# Patient Record
Sex: Female | Born: 1940 | Race: White | Hispanic: No | Marital: Married | Smoking: Current every day smoker
Health system: Southern US, Community
[De-identification: ages and names within clinical notes are randomized; demographics above are authoritative.]

## PROBLEM LIST (undated history)

## (undated) DIAGNOSIS — J439 Emphysema, unspecified: Secondary | ICD-10-CM

## (undated) DIAGNOSIS — R42 Dizziness and giddiness: Secondary | ICD-10-CM

## (undated) DIAGNOSIS — H905 Unspecified sensorineural hearing loss: Secondary | ICD-10-CM

## (undated) DIAGNOSIS — M1990 Unspecified osteoarthritis, unspecified site: Secondary | ICD-10-CM

## (undated) DIAGNOSIS — R600 Localized edema: Secondary | ICD-10-CM

## (undated) DIAGNOSIS — I700 Atherosclerosis of aorta: Secondary | ICD-10-CM

## (undated) DIAGNOSIS — H9319 Tinnitus, unspecified ear: Secondary | ICD-10-CM

## (undated) DIAGNOSIS — J309 Allergic rhinitis, unspecified: Secondary | ICD-10-CM

## (undated) DIAGNOSIS — F329 Major depressive disorder, single episode, unspecified: Secondary | ICD-10-CM

## (undated) DIAGNOSIS — Z8709 Personal history of other diseases of the respiratory system: Secondary | ICD-10-CM

## (undated) DIAGNOSIS — K219 Gastro-esophageal reflux disease without esophagitis: Secondary | ICD-10-CM

## (undated) DIAGNOSIS — K589 Irritable bowel syndrome without diarrhea: Secondary | ICD-10-CM

## (undated) DIAGNOSIS — Z87442 Personal history of urinary calculi: Secondary | ICD-10-CM

## (undated) DIAGNOSIS — R51 Headache: Secondary | ICD-10-CM

## (undated) DIAGNOSIS — E785 Hyperlipidemia, unspecified: Secondary | ICD-10-CM

## (undated) DIAGNOSIS — B370 Candidal stomatitis: Secondary | ICD-10-CM

## (undated) DIAGNOSIS — H3530 Unspecified macular degeneration: Secondary | ICD-10-CM

## (undated) DIAGNOSIS — F419 Anxiety disorder, unspecified: Secondary | ICD-10-CM

## (undated) DIAGNOSIS — N201 Calculus of ureter: Secondary | ICD-10-CM

## (undated) DIAGNOSIS — F32A Depression, unspecified: Secondary | ICD-10-CM

## (undated) DIAGNOSIS — R634 Abnormal weight loss: Secondary | ICD-10-CM

## (undated) DIAGNOSIS — J45909 Unspecified asthma, uncomplicated: Secondary | ICD-10-CM

## (undated) DIAGNOSIS — M549 Dorsalgia, unspecified: Secondary | ICD-10-CM

## (undated) DIAGNOSIS — K529 Noninfective gastroenteritis and colitis, unspecified: Secondary | ICD-10-CM

## (undated) DIAGNOSIS — R918 Other nonspecific abnormal finding of lung field: Secondary | ICD-10-CM

## (undated) DIAGNOSIS — I10 Essential (primary) hypertension: Secondary | ICD-10-CM

## (undated) DIAGNOSIS — R519 Headache, unspecified: Secondary | ICD-10-CM

## (undated) DIAGNOSIS — J189 Pneumonia, unspecified organism: Secondary | ICD-10-CM

## (undated) HISTORY — DX: Unspecified sensorineural hearing loss: H90.5

## (undated) HISTORY — DX: Abnormal weight loss: R63.4

## (undated) HISTORY — DX: Dizziness and giddiness: R42

## (undated) HISTORY — PX: OTHER SURGICAL HISTORY: SHX169

## (undated) HISTORY — PX: ECTOPIC PREGNANCY SURGERY: SHX613

## (undated) HISTORY — DX: Irritable bowel syndrome, unspecified: K58.9

## (undated) HISTORY — PX: NASAL SINUS SURGERY: SHX719

## (undated) HISTORY — DX: Allergic rhinitis, unspecified: J30.9

## (undated) HISTORY — PX: TUBAL LIGATION: SHX77

## (undated) HISTORY — DX: Tinnitus, unspecified ear: H93.19

## (undated) HISTORY — PX: CATARACT EXTRACTION: SUR2

---

## 2004-07-14 ENCOUNTER — Encounter: Admission: RE | Admit: 2004-07-14 | Discharge: 2004-07-14 | Payer: Self-pay | Admitting: Family Medicine

## 2004-07-27 ENCOUNTER — Ambulatory Visit: Payer: Self-pay | Admitting: Critical Care Medicine

## 2004-07-30 ENCOUNTER — Ambulatory Visit (HOSPITAL_COMMUNITY): Admission: RE | Admit: 2004-07-30 | Discharge: 2004-07-30 | Payer: Self-pay | Admitting: Critical Care Medicine

## 2004-08-18 ENCOUNTER — Ambulatory Visit: Payer: Self-pay | Admitting: Critical Care Medicine

## 2005-01-03 ENCOUNTER — Encounter: Admission: RE | Admit: 2005-01-03 | Discharge: 2005-01-03 | Payer: Self-pay | Admitting: Family Medicine

## 2005-09-30 ENCOUNTER — Encounter: Admission: RE | Admit: 2005-09-30 | Discharge: 2005-09-30 | Payer: Self-pay | Admitting: Family Medicine

## 2006-07-25 ENCOUNTER — Encounter: Admission: RE | Admit: 2006-07-25 | Discharge: 2006-07-25 | Payer: Self-pay | Admitting: Family Medicine

## 2009-08-10 ENCOUNTER — Ambulatory Visit (HOSPITAL_BASED_OUTPATIENT_CLINIC_OR_DEPARTMENT_OTHER): Admission: RE | Admit: 2009-08-10 | Discharge: 2009-08-10 | Payer: Self-pay | Admitting: Urology

## 2009-08-10 HISTORY — PX: OTHER SURGICAL HISTORY: SHX169

## 2010-04-24 ENCOUNTER — Encounter: Payer: Self-pay | Admitting: Family Medicine

## 2010-04-25 ENCOUNTER — Encounter: Payer: Self-pay | Admitting: Critical Care Medicine

## 2010-06-22 LAB — POCT I-STAT 4, (NA,K, GLUC, HGB,HCT)
Glucose, Bld: 95 mg/dL (ref 70–99)
HCT: 37 % (ref 36.0–46.0)
Hemoglobin: 12.6 g/dL (ref 12.0–15.0)
Potassium: 4.3 mEq/L (ref 3.5–5.1)
Sodium: 142 mEq/L (ref 135–145)

## 2011-08-04 ENCOUNTER — Other Ambulatory Visit: Payer: Self-pay | Admitting: Otolaryngology

## 2011-08-04 DIAGNOSIS — R42 Dizziness and giddiness: Secondary | ICD-10-CM

## 2011-08-04 DIAGNOSIS — H9319 Tinnitus, unspecified ear: Secondary | ICD-10-CM

## 2011-08-10 ENCOUNTER — Ambulatory Visit
Admission: RE | Admit: 2011-08-10 | Discharge: 2011-08-10 | Disposition: A | Discharge status: Home / Self Care | Payer: Medicare Other | Source: Ambulatory Visit | Attending: Otolaryngology | Admitting: Otolaryngology

## 2011-08-10 DIAGNOSIS — R42 Dizziness and giddiness: Secondary | ICD-10-CM

## 2011-08-10 DIAGNOSIS — H9319 Tinnitus, unspecified ear: Secondary | ICD-10-CM

## 2011-08-10 MED ORDER — GADOBENATE DIMEGLUMINE 529 MG/ML IV SOLN
10.0000 mL | Freq: Once | INTRAVENOUS | Status: AC | PRN
  Administered 2011-08-10: 10 mL via INTRAVENOUS

## 2011-09-21 ENCOUNTER — Emergency Department (HOSPITAL_COMMUNITY)
Admission: EM | Admit: 2011-09-21 | Discharge: 2011-09-21 | Disposition: A | Discharge status: Home / Self Care | Payer: Medicare Other | Attending: Emergency Medicine | Admitting: Emergency Medicine

## 2011-09-21 ENCOUNTER — Encounter (HOSPITAL_COMMUNITY): Payer: Self-pay | Admitting: *Deleted

## 2011-09-21 DIAGNOSIS — R040 Epistaxis: Secondary | ICD-10-CM

## 2011-09-21 DIAGNOSIS — I10 Essential (primary) hypertension: Secondary | ICD-10-CM | POA: Insufficient documentation

## 2011-09-21 DIAGNOSIS — H1130 Conjunctival hemorrhage, unspecified eye: Secondary | ICD-10-CM | POA: Insufficient documentation

## 2011-09-21 HISTORY — DX: Essential (primary) hypertension: I10

## 2011-09-21 HISTORY — DX: Major depressive disorder, single episode, unspecified: F32.9

## 2011-09-21 HISTORY — DX: Depression, unspecified: F32.A

## 2011-09-21 MED ORDER — OXYMETAZOLINE HCL 0.05 % NA SOLN
NASAL | Status: AC
  Administered 2011-09-21: 1 via NASAL
  Filled 2011-09-21: qty 15

## 2011-09-21 MED ORDER — OXYMETAZOLINE HCL 0.05 % NA SOLN
1.0000 | Freq: Once | NASAL | Status: AC
  Administered 2011-09-21: 1 via NASAL

## 2011-09-21 NOTE — ED Notes (Signed)
Ice pack applied to pt nose by MD

## 2011-09-21 NOTE — ED Notes (Signed)
Active nosebleed to left nares. Pt applying pressure. Pt reports nosebleed started approx 45 minutes ago. Pt states she had sinus surgery Monday and was to follow up with MD on Friday.

## 2011-09-21 NOTE — ED Notes (Signed)
Pt presents with epistaxis on left side. Reports having surgery Monday 09/12/2011 to treat sinus infection (Dr. Jenne Pane). States she has follow up apt Friday 09/23/2011 with surgeon. Left eye red - states this began this morning upon awakening. Denies pain or tenderness.

## 2011-09-21 NOTE — ED Provider Notes (Signed)
History     CSN: [id number]  Arrival date & time 09/21/11  0840   First MD Initiated Contact with Patient 09/21/11 [phone]      Chief Complaint  Patient presents with  . Epistaxis    (Consider location/radiation/quality/duration/timing/severity/associated sxs/prior treatment) The history is provided by the patient.   the patient reports developing bleeding from her left nostril which began today.  She takes an aspirin but no other anticoagulants.  10 days ago she had right sided sinus surgery.  She's never had nosebleeds like this before.  She denies trauma to the area.  She denies recent easy bleeding.  Her symptoms have been minimally controlled by direct pressure.  She called her in her nose and throat surgeon in the recording stated that this was an emergency to go to the emergency room and that she presents here for evaluation.  Past Medical History  Diagnosis Date  . Hypertension   . Depression     Past Surgical History  Procedure Date  . Nasal sinus surgery     No family history on file.  History  Substance Use Topics  . Smoking status: Current Everyday Smoker -- 1.0 packs/day for 25 years    Types: Cigarettes  . Smokeless tobacco: Not on file  . Alcohol Use: Yes     socially    OB History    Grav Para Term Preterm Abortions TAB SAB Ect Mult Living                  Review of Systems  HENT: Positive for nosebleeds.   All other systems reviewed and are negative.    Allergies  Penicillins and Sulfonamide derivatives  Home Medications   Current Outpatient Rx  Name Route Sig Dispense Refill  . AMLODIPINE BESYLATE 5 MG PO TABS Oral Take 5 mg by mouth daily.    . ASPIRIN EC 81 MG PO TBEC Oral Take 81 mg by mouth daily.    . ATORVASTATIN CALCIUM 40 MG PO TABS Oral Take 40 mg by mouth daily.    Marland Kitchen FLUTICASONE PROPIONATE 50 MCG/ACT NA SUSP Nasal Place 2 sprays into the nose daily.    Marland Kitchen LOPERAMIDE HCL 2 MG PO CAPS Oral Take 2 mg by mouth 4 (four) times daily as  needed. For upset stomach    . MOMETASONE FUROATE 220 MCG/INH IN AEPB Inhalation Inhale 2 puffs into the lungs daily.    Marland Kitchen MONTELUKAST SODIUM 10 MG PO TABS Oral Take 10 mg by mouth at bedtime.    . ADULT MULTIVITAMIN W/MINERALS CH Oral Take 1 tablet by mouth daily.    Marland Kitchen NAPROXEN SODIUM 220 MG PO TABS Oral Take 220 mg by mouth daily. For pain    . RANITIDINE HCL 300 MG PO TABS Oral Take 300 mg by mouth 2 (two) times daily.    Marland Kitchen SOLIFENACIN SUCCINATE 10 MG PO TABS Oral Take 5 mg by mouth daily.      BP 154/75  Pulse 77  Ht 5\' 4"  (1.626 m)  Wt 107 lb (48.535 kg)  BMI 18.37 kg/m2  SpO2 97%  Physical Exam  Nursing note and vitals reviewed. Constitutional: She is oriented to person, place, and time. She appears well-developed and well-nourished. No distress.  HENT:  Head: Normocephalic and atraumatic.       Subconjunctival hemorrhage left eye.  Small bleeding noted anteriorly from left nare.  No posterior bleeding noted.  Pharynx is normal  Eyes: EOM are normal.  Neck: Normal range  of motion.  Cardiovascular: Normal rate, regular rhythm and normal heart sounds.   Pulmonary/Chest: Effort normal and breath sounds normal.  Abdominal: Soft. She exhibits no distension. There is no tenderness.  Musculoskeletal: Normal range of motion.  Neurological: She is alert and oriented to person, place, and time.  Skin: Skin is warm and dry.  Psychiatric: She has a normal mood and affect. Judgment normal.    ED Course  EPISTAXIS MANAGEMENT Performed by: Lyanne Co Authorized by: Lyanne Co Consent given by: patient Anesthetic total: 3 ml Patient sedated: no Treatment site: left anterior Repair method: Direct pressure and Afrin. Post-procedure assessment: bleeding stopped Patient tolerance: Patient tolerated the procedure well with no immediate complications.   (including critical care time)  Labs Reviewed - No data to display No results found.   1. Epistaxis       MDM  The  patient's left anterior nosebleed appears to be controlled with Afrin and direct pressure.  She has an ice pack on her nose at this time.  I will observe the patient emergency department.  10:50 AM No bleeding at this time. No recurrent bleeding. Dc home with ENT follow up        Lyanne Co, MD 09/21/11 1050

## 2011-09-21 NOTE — ED Notes (Signed)
ENT cart placed at pt door.

## 2011-09-21 NOTE — ED Notes (Signed)
Pt husband at bedside holding pressure to nares. Pt in no apparent distress and will be reassessed by MD and nurse.

## 2011-09-21 NOTE — Discharge Instructions (Signed)

## 2012-01-23 ENCOUNTER — Encounter (INDEPENDENT_AMBULATORY_CARE_PROVIDER_SITE_OTHER): Payer: Medicare Other | Admitting: Ophthalmology

## 2012-01-23 DIAGNOSIS — H43819 Vitreous degeneration, unspecified eye: Secondary | ICD-10-CM

## 2012-01-23 DIAGNOSIS — H3530 Unspecified macular degeneration: Secondary | ICD-10-CM

## 2012-01-23 DIAGNOSIS — I10 Essential (primary) hypertension: Secondary | ICD-10-CM

## 2012-01-23 DIAGNOSIS — H35039 Hypertensive retinopathy, unspecified eye: Secondary | ICD-10-CM

## 2012-01-23 DIAGNOSIS — H2510 Age-related nuclear cataract, unspecified eye: Secondary | ICD-10-CM

## 2012-01-23 DIAGNOSIS — H353290 Exudative age-related macular degeneration, unspecified eye, stage unspecified: Secondary | ICD-10-CM

## 2012-01-23 DIAGNOSIS — H35379 Puckering of macula, unspecified eye: Secondary | ICD-10-CM

## 2012-01-27 ENCOUNTER — Ambulatory Visit (INDEPENDENT_AMBULATORY_CARE_PROVIDER_SITE_OTHER): Payer: Medicare Other | Admitting: Ophthalmology

## 2012-01-27 DIAGNOSIS — H3530 Unspecified macular degeneration: Secondary | ICD-10-CM

## 2012-01-27 DIAGNOSIS — H2510 Age-related nuclear cataract, unspecified eye: Secondary | ICD-10-CM

## 2012-01-27 DIAGNOSIS — H43819 Vitreous degeneration, unspecified eye: Secondary | ICD-10-CM

## 2012-01-27 DIAGNOSIS — I10 Essential (primary) hypertension: Secondary | ICD-10-CM

## 2012-01-27 DIAGNOSIS — H35039 Hypertensive retinopathy, unspecified eye: Secondary | ICD-10-CM

## 2012-01-27 DIAGNOSIS — H353290 Exudative age-related macular degeneration, unspecified eye, stage unspecified: Secondary | ICD-10-CM

## 2012-02-21 ENCOUNTER — Encounter (INDEPENDENT_AMBULATORY_CARE_PROVIDER_SITE_OTHER): Payer: Medicare Other | Admitting: Ophthalmology

## 2012-02-21 DIAGNOSIS — H35039 Hypertensive retinopathy, unspecified eye: Secondary | ICD-10-CM

## 2012-02-21 DIAGNOSIS — H353290 Exudative age-related macular degeneration, unspecified eye, stage unspecified: Secondary | ICD-10-CM

## 2012-02-21 DIAGNOSIS — H3530 Unspecified macular degeneration: Secondary | ICD-10-CM

## 2012-02-21 DIAGNOSIS — H43819 Vitreous degeneration, unspecified eye: Secondary | ICD-10-CM

## 2012-02-21 DIAGNOSIS — I10 Essential (primary) hypertension: Secondary | ICD-10-CM

## 2012-02-21 DIAGNOSIS — H2510 Age-related nuclear cataract, unspecified eye: Secondary | ICD-10-CM

## 2012-03-21 ENCOUNTER — Encounter (INDEPENDENT_AMBULATORY_CARE_PROVIDER_SITE_OTHER): Payer: Medicare Other | Admitting: Ophthalmology

## 2012-03-21 DIAGNOSIS — H3530 Unspecified macular degeneration: Secondary | ICD-10-CM

## 2012-03-21 DIAGNOSIS — H353290 Exudative age-related macular degeneration, unspecified eye, stage unspecified: Secondary | ICD-10-CM

## 2012-03-21 DIAGNOSIS — H35039 Hypertensive retinopathy, unspecified eye: Secondary | ICD-10-CM

## 2012-03-21 DIAGNOSIS — I10 Essential (primary) hypertension: Secondary | ICD-10-CM

## 2012-03-21 DIAGNOSIS — H2510 Age-related nuclear cataract, unspecified eye: Secondary | ICD-10-CM

## 2012-03-21 DIAGNOSIS — H43819 Vitreous degeneration, unspecified eye: Secondary | ICD-10-CM

## 2012-04-18 ENCOUNTER — Encounter (INDEPENDENT_AMBULATORY_CARE_PROVIDER_SITE_OTHER): Payer: Medicare Other | Admitting: Ophthalmology

## 2012-04-18 DIAGNOSIS — I10 Essential (primary) hypertension: Secondary | ICD-10-CM

## 2012-04-18 DIAGNOSIS — H3530 Unspecified macular degeneration: Secondary | ICD-10-CM

## 2012-04-18 DIAGNOSIS — H2510 Age-related nuclear cataract, unspecified eye: Secondary | ICD-10-CM

## 2012-04-18 DIAGNOSIS — H35039 Hypertensive retinopathy, unspecified eye: Secondary | ICD-10-CM

## 2012-04-18 DIAGNOSIS — H43819 Vitreous degeneration, unspecified eye: Secondary | ICD-10-CM

## 2012-04-18 DIAGNOSIS — H353290 Exudative age-related macular degeneration, unspecified eye, stage unspecified: Secondary | ICD-10-CM

## 2012-05-15 ENCOUNTER — Encounter (INDEPENDENT_AMBULATORY_CARE_PROVIDER_SITE_OTHER): Payer: Medicare Other | Admitting: Ophthalmology

## 2012-05-15 DIAGNOSIS — H2510 Age-related nuclear cataract, unspecified eye: Secondary | ICD-10-CM

## 2012-05-15 DIAGNOSIS — I10 Essential (primary) hypertension: Secondary | ICD-10-CM

## 2012-05-15 DIAGNOSIS — H35039 Hypertensive retinopathy, unspecified eye: Secondary | ICD-10-CM

## 2012-05-15 DIAGNOSIS — H353290 Exudative age-related macular degeneration, unspecified eye, stage unspecified: Secondary | ICD-10-CM

## 2012-05-15 DIAGNOSIS — H43819 Vitreous degeneration, unspecified eye: Secondary | ICD-10-CM

## 2012-05-15 DIAGNOSIS — H3530 Unspecified macular degeneration: Secondary | ICD-10-CM

## 2012-05-16 ENCOUNTER — Encounter (INDEPENDENT_AMBULATORY_CARE_PROVIDER_SITE_OTHER): Payer: Medicare Other | Admitting: Ophthalmology

## 2012-06-13 ENCOUNTER — Encounter (INDEPENDENT_AMBULATORY_CARE_PROVIDER_SITE_OTHER): Payer: Medicare Other | Admitting: Ophthalmology

## 2012-06-13 DIAGNOSIS — H353290 Exudative age-related macular degeneration, unspecified eye, stage unspecified: Secondary | ICD-10-CM

## 2012-06-13 DIAGNOSIS — H43819 Vitreous degeneration, unspecified eye: Secondary | ICD-10-CM

## 2012-06-13 DIAGNOSIS — H3530 Unspecified macular degeneration: Secondary | ICD-10-CM

## 2012-06-13 DIAGNOSIS — H35039 Hypertensive retinopathy, unspecified eye: Secondary | ICD-10-CM

## 2012-06-13 DIAGNOSIS — H2510 Age-related nuclear cataract, unspecified eye: Secondary | ICD-10-CM

## 2012-06-13 DIAGNOSIS — I10 Essential (primary) hypertension: Secondary | ICD-10-CM

## 2012-07-11 ENCOUNTER — Encounter (INDEPENDENT_AMBULATORY_CARE_PROVIDER_SITE_OTHER): Payer: Medicare Other | Admitting: Ophthalmology

## 2012-07-11 DIAGNOSIS — H2510 Age-related nuclear cataract, unspecified eye: Secondary | ICD-10-CM

## 2012-07-11 DIAGNOSIS — H3530 Unspecified macular degeneration: Secondary | ICD-10-CM

## 2012-07-11 DIAGNOSIS — I10 Essential (primary) hypertension: Secondary | ICD-10-CM

## 2012-07-11 DIAGNOSIS — H43819 Vitreous degeneration, unspecified eye: Secondary | ICD-10-CM

## 2012-07-11 DIAGNOSIS — H353290 Exudative age-related macular degeneration, unspecified eye, stage unspecified: Secondary | ICD-10-CM

## 2012-07-11 DIAGNOSIS — H35039 Hypertensive retinopathy, unspecified eye: Secondary | ICD-10-CM

## 2012-08-08 ENCOUNTER — Encounter (INDEPENDENT_AMBULATORY_CARE_PROVIDER_SITE_OTHER): Payer: Medicare Other | Admitting: Ophthalmology

## 2012-08-08 DIAGNOSIS — H35039 Hypertensive retinopathy, unspecified eye: Secondary | ICD-10-CM

## 2012-08-08 DIAGNOSIS — H2510 Age-related nuclear cataract, unspecified eye: Secondary | ICD-10-CM

## 2012-08-08 DIAGNOSIS — H3530 Unspecified macular degeneration: Secondary | ICD-10-CM

## 2012-08-08 DIAGNOSIS — H43819 Vitreous degeneration, unspecified eye: Secondary | ICD-10-CM

## 2012-08-08 DIAGNOSIS — H353290 Exudative age-related macular degeneration, unspecified eye, stage unspecified: Secondary | ICD-10-CM

## 2012-08-08 DIAGNOSIS — I10 Essential (primary) hypertension: Secondary | ICD-10-CM

## 2012-09-12 ENCOUNTER — Encounter (INDEPENDENT_AMBULATORY_CARE_PROVIDER_SITE_OTHER): Payer: Medicare Other | Admitting: Ophthalmology

## 2012-09-12 DIAGNOSIS — H43819 Vitreous degeneration, unspecified eye: Secondary | ICD-10-CM

## 2012-09-12 DIAGNOSIS — H3530 Unspecified macular degeneration: Secondary | ICD-10-CM

## 2012-09-12 DIAGNOSIS — H2510 Age-related nuclear cataract, unspecified eye: Secondary | ICD-10-CM

## 2012-09-12 DIAGNOSIS — H353290 Exudative age-related macular degeneration, unspecified eye, stage unspecified: Secondary | ICD-10-CM

## 2012-09-12 DIAGNOSIS — I10 Essential (primary) hypertension: Secondary | ICD-10-CM

## 2012-09-12 DIAGNOSIS — H35039 Hypertensive retinopathy, unspecified eye: Secondary | ICD-10-CM

## 2012-10-10 ENCOUNTER — Encounter (INDEPENDENT_AMBULATORY_CARE_PROVIDER_SITE_OTHER): Payer: Medicare Other | Admitting: Ophthalmology

## 2012-10-10 DIAGNOSIS — H43819 Vitreous degeneration, unspecified eye: Secondary | ICD-10-CM

## 2012-10-10 DIAGNOSIS — H2510 Age-related nuclear cataract, unspecified eye: Secondary | ICD-10-CM

## 2012-10-10 DIAGNOSIS — H3530 Unspecified macular degeneration: Secondary | ICD-10-CM

## 2012-10-10 DIAGNOSIS — I10 Essential (primary) hypertension: Secondary | ICD-10-CM

## 2012-10-10 DIAGNOSIS — H35039 Hypertensive retinopathy, unspecified eye: Secondary | ICD-10-CM

## 2012-10-10 DIAGNOSIS — H353290 Exudative age-related macular degeneration, unspecified eye, stage unspecified: Secondary | ICD-10-CM

## 2012-11-07 ENCOUNTER — Encounter (INDEPENDENT_AMBULATORY_CARE_PROVIDER_SITE_OTHER): Payer: Medicare Other | Admitting: Ophthalmology

## 2012-11-07 DIAGNOSIS — H43819 Vitreous degeneration, unspecified eye: Secondary | ICD-10-CM

## 2012-11-07 DIAGNOSIS — H35039 Hypertensive retinopathy, unspecified eye: Secondary | ICD-10-CM

## 2012-11-07 DIAGNOSIS — H353290 Exudative age-related macular degeneration, unspecified eye, stage unspecified: Secondary | ICD-10-CM

## 2012-11-07 DIAGNOSIS — I10 Essential (primary) hypertension: Secondary | ICD-10-CM

## 2012-11-07 DIAGNOSIS — H3530 Unspecified macular degeneration: Secondary | ICD-10-CM

## 2012-11-07 DIAGNOSIS — H2510 Age-related nuclear cataract, unspecified eye: Secondary | ICD-10-CM

## 2012-12-05 ENCOUNTER — Encounter (INDEPENDENT_AMBULATORY_CARE_PROVIDER_SITE_OTHER): Payer: Medicare Other | Admitting: Ophthalmology

## 2012-12-10 ENCOUNTER — Encounter (INDEPENDENT_AMBULATORY_CARE_PROVIDER_SITE_OTHER): Payer: Medicare Other | Admitting: Ophthalmology

## 2012-12-10 DIAGNOSIS — I10 Essential (primary) hypertension: Secondary | ICD-10-CM

## 2012-12-10 DIAGNOSIS — H353290 Exudative age-related macular degeneration, unspecified eye, stage unspecified: Secondary | ICD-10-CM

## 2012-12-10 DIAGNOSIS — H2510 Age-related nuclear cataract, unspecified eye: Secondary | ICD-10-CM

## 2012-12-10 DIAGNOSIS — H3530 Unspecified macular degeneration: Secondary | ICD-10-CM

## 2012-12-10 DIAGNOSIS — H43819 Vitreous degeneration, unspecified eye: Secondary | ICD-10-CM

## 2012-12-10 DIAGNOSIS — H35039 Hypertensive retinopathy, unspecified eye: Secondary | ICD-10-CM

## 2013-01-07 ENCOUNTER — Encounter (INDEPENDENT_AMBULATORY_CARE_PROVIDER_SITE_OTHER): Payer: Medicare Other | Admitting: Ophthalmology

## 2013-01-07 DIAGNOSIS — H353290 Exudative age-related macular degeneration, unspecified eye, stage unspecified: Secondary | ICD-10-CM

## 2013-01-07 DIAGNOSIS — H3530 Unspecified macular degeneration: Secondary | ICD-10-CM

## 2013-01-07 DIAGNOSIS — H35039 Hypertensive retinopathy, unspecified eye: Secondary | ICD-10-CM

## 2013-01-07 DIAGNOSIS — H43819 Vitreous degeneration, unspecified eye: Secondary | ICD-10-CM

## 2013-01-07 DIAGNOSIS — I10 Essential (primary) hypertension: Secondary | ICD-10-CM

## 2013-01-07 DIAGNOSIS — H2510 Age-related nuclear cataract, unspecified eye: Secondary | ICD-10-CM

## 2013-02-04 ENCOUNTER — Encounter (INDEPENDENT_AMBULATORY_CARE_PROVIDER_SITE_OTHER): Payer: Medicare Other | Admitting: Ophthalmology

## 2013-02-04 DIAGNOSIS — H353290 Exudative age-related macular degeneration, unspecified eye, stage unspecified: Secondary | ICD-10-CM

## 2013-02-04 DIAGNOSIS — H43819 Vitreous degeneration, unspecified eye: Secondary | ICD-10-CM

## 2013-02-04 DIAGNOSIS — H35039 Hypertensive retinopathy, unspecified eye: Secondary | ICD-10-CM

## 2013-02-04 DIAGNOSIS — H3530 Unspecified macular degeneration: Secondary | ICD-10-CM

## 2013-02-04 DIAGNOSIS — I10 Essential (primary) hypertension: Secondary | ICD-10-CM

## 2013-02-04 DIAGNOSIS — H2510 Age-related nuclear cataract, unspecified eye: Secondary | ICD-10-CM

## 2013-03-04 ENCOUNTER — Encounter (INDEPENDENT_AMBULATORY_CARE_PROVIDER_SITE_OTHER): Payer: Medicare Other | Admitting: Ophthalmology

## 2013-03-04 DIAGNOSIS — H43819 Vitreous degeneration, unspecified eye: Secondary | ICD-10-CM

## 2013-03-04 DIAGNOSIS — H353290 Exudative age-related macular degeneration, unspecified eye, stage unspecified: Secondary | ICD-10-CM

## 2013-03-04 DIAGNOSIS — I10 Essential (primary) hypertension: Secondary | ICD-10-CM

## 2013-03-04 DIAGNOSIS — H2510 Age-related nuclear cataract, unspecified eye: Secondary | ICD-10-CM

## 2013-03-04 DIAGNOSIS — H35039 Hypertensive retinopathy, unspecified eye: Secondary | ICD-10-CM

## 2013-03-04 DIAGNOSIS — H3530 Unspecified macular degeneration: Secondary | ICD-10-CM

## 2013-03-27 ENCOUNTER — Ambulatory Visit: Payer: Medicare Other | Attending: Family Medicine | Admitting: Rehabilitative and Restorative Service Providers"

## 2013-03-27 DIAGNOSIS — R42 Dizziness and giddiness: Secondary | ICD-10-CM | POA: Insufficient documentation

## 2013-03-27 DIAGNOSIS — R269 Unspecified abnormalities of gait and mobility: Secondary | ICD-10-CM | POA: Insufficient documentation

## 2013-03-27 DIAGNOSIS — IMO0001 Reserved for inherently not codable concepts without codable children: Secondary | ICD-10-CM | POA: Insufficient documentation

## 2013-04-01 ENCOUNTER — Encounter (INDEPENDENT_AMBULATORY_CARE_PROVIDER_SITE_OTHER): Payer: Medicare Other | Admitting: Ophthalmology

## 2013-04-01 DIAGNOSIS — H2510 Age-related nuclear cataract, unspecified eye: Secondary | ICD-10-CM

## 2013-04-01 DIAGNOSIS — H3530 Unspecified macular degeneration: Secondary | ICD-10-CM

## 2013-04-01 DIAGNOSIS — H35039 Hypertensive retinopathy, unspecified eye: Secondary | ICD-10-CM

## 2013-04-01 DIAGNOSIS — H43819 Vitreous degeneration, unspecified eye: Secondary | ICD-10-CM

## 2013-04-01 DIAGNOSIS — H353290 Exudative age-related macular degeneration, unspecified eye, stage unspecified: Secondary | ICD-10-CM

## 2013-04-01 DIAGNOSIS — I10 Essential (primary) hypertension: Secondary | ICD-10-CM

## 2013-04-02 ENCOUNTER — Ambulatory Visit: Payer: Medicare Other | Admitting: Rehabilitative and Restorative Service Providers"

## 2013-04-09 ENCOUNTER — Ambulatory Visit: Payer: Medicare Other | Attending: Family Medicine | Admitting: Rehabilitative and Restorative Service Providers"

## 2013-04-09 DIAGNOSIS — IMO0001 Reserved for inherently not codable concepts without codable children: Secondary | ICD-10-CM | POA: Insufficient documentation

## 2013-04-09 DIAGNOSIS — R42 Dizziness and giddiness: Secondary | ICD-10-CM | POA: Insufficient documentation

## 2013-04-09 DIAGNOSIS — R269 Unspecified abnormalities of gait and mobility: Secondary | ICD-10-CM | POA: Insufficient documentation

## 2013-04-16 ENCOUNTER — Ambulatory Visit: Payer: Medicare Other | Admitting: Rehabilitative and Restorative Service Providers"

## 2013-04-19 ENCOUNTER — Encounter: Payer: Self-pay | Admitting: Neurology

## 2013-04-22 ENCOUNTER — Encounter (INDEPENDENT_AMBULATORY_CARE_PROVIDER_SITE_OTHER): Payer: Self-pay

## 2013-04-22 ENCOUNTER — Encounter: Payer: Self-pay | Admitting: Neurology

## 2013-04-22 ENCOUNTER — Ambulatory Visit (INDEPENDENT_AMBULATORY_CARE_PROVIDER_SITE_OTHER): Payer: Medicare Other | Admitting: Neurology

## 2013-04-22 VITALS — BP 156/78 | HR 64 | Ht 63.0 in | Wt 104.0 lb

## 2013-04-22 DIAGNOSIS — R42 Dizziness and giddiness: Secondary | ICD-10-CM

## 2013-04-22 DIAGNOSIS — R269 Unspecified abnormalities of gait and mobility: Secondary | ICD-10-CM

## 2013-04-22 DIAGNOSIS — R2681 Unsteadiness on feet: Secondary | ICD-10-CM | POA: Insufficient documentation

## 2013-04-22 NOTE — Progress Notes (Addendum)
[facility]  PATIENT: Gina Fritz DOB: May 31, 1940  HISTORICAL  Gina Fritz is a 73 yo female, accompanied by her friend Gina Fritz of 19 years for evaluation of dizziness, She is referred by her primary care physician Dr. Marlyn Fritz.  She had past medical history of hypertension, hyperlipidemia, depression and anxiety, long-time smoker  Around December 2014, she reported a good onset of vertigo,  but she could not remember the exact date,  she complains of vertigo, nausea, double vision, gait difficulty, she needs help to get out of bed using bathroom,    Two weeks later,her symptoms has much improved, but she still feel unbalanced, she was evaluated by Dr. 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 Zigmund Fritz in the past, was diagnosed with macular degeneration,  She continued to have mild unsteady gait, has not been eating well for past few months, lost 20 pounds over 6 months,    REVIEW OF SYSTEMS: Full 14 system review of systems performed and notable only for weight loss, fatigue, ringing in the ear, hearing loss, sleepiness, depression, anxiety, decreased energy, disinterested in activities,   ALLERGIES: Allergies  Allergen Reactions  . Penicillins Anaphylaxis  . Sulfonamide Derivatives Rash    HOME MEDICATIONS: Outpatient Prescriptions Prior to Visit  Medication Sig Dispense Refill  . amLODipine (NORVASC) 5 MG tablet Take 5 mg by mouth daily.      Marland Kitchen aspirin EC 81 MG tablet Take 81 mg by mouth daily.      Marland Kitchen atorvastatin (LIPITOR) 40 MG tablet Take 40 mg by mouth daily.      Marland Kitchen escitalopram (LEXAPRO) 20 MG tablet       . fluticasone (FLONASE) 50 MCG/ACT nasal spray Place 2 sprays into the nose daily.      Marland Kitchen loperamide (IMODIUM) 2 MG capsule  Take 2 mg by mouth 4 (four) times daily as needed. For upset stomach      . mometasone (ASMANEX) 220 MCG/INH inhaler Inhale 2 puffs into the lungs daily.      . montelukast (SINGULAIR) 10 MG tablet Take 10 mg by mouth at bedtime.      . Multiple Vitamin (MULTIVITAMIN WITH MINERALS) TABS Take 1 tablet by mouth daily.      . naproxen sodium (ANAPROX) 220 MG tablet Take 220 mg by mouth daily. For pain      . ranitidine (ZANTAC) 300 MG tablet Take 300 mg by mouth 2 (two) times daily.      . solifenacin (VESICARE) 10 MG tablet Take 5 mg by mouth daily.         PAST MEDICAL HISTORY: Past Medical History  Diagnosis Date  . Hypertension   . Depression   . Allergic rhinitis   . Rash   . Chronic ethmoidal sinusitis   . Chronic maxillary sinusitis   . Chronic airway obstruction, not elsewhere classified   . Irritable bowel syndrome   . Unspecified protein-calorie malnutrition   . Pure hypercholesterolemia   . Sensorineural hearing loss, unilateral   . Subjective tinnitus   . Tobacco use disorder   . Loss of weight   . Dizziness     PAST SURGICAL HISTORY: Past Surgical History  Procedure Laterality Date  . Nasal sinus surgery    . Appendectomy    . Rhinologic surgery    .  Tubal ligation    . Cataract extraction Left     FAMILY HISTORY: Family History  Problem Relation Age of Onset  . Congestive Heart Failure Mother   . Hearing loss Mother   . Hypertension Father   . Stroke Mother     syndrome  . Other      thyroid disorder    SOCIAL HISTORY:  History   Social History  . Marital Status: Married    Spouse Name: Gina Fritz    Number of Children: 3  . 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 Smoking status: Current Every Day Smoker -- 1.00 packs/day for 25 years    Types: Cigarettes  . Smokeless tobacco: Never Used  . Alcohol Use: 0.6 oz/week    1 Glasses of wine per week      Comment: One drink a week.  . Drug Use: No  . Sexual Activity: Not on file   Other Topics Concern  . Not on file   Social History Narrative   Patient is married Gina Fritz) and lives at home with her husband.   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   Caffeine- Six cups of coffee daily and six cups of coke cola daily.          PHYSICAL EXAM   Filed Vitals:   04/22/13 1441  BP: 156/78  Pulse: 64  Height: 5\' 3"  (1.6 m)  Weight: 104 lb (47.174 kg)    Not recorded    Body mass index is 18.43 kg/(m^2).   Generalized: In no acute distress  Neck: Supple, no carotid bruits   Cardiac: Regular rate rhythm  Pulmonary: Clear to auscultation bilaterally  Musculoskeletal: No deformity  Neurological examination  Mentation: tired looking elderly female  Alert oriented to time, place, history taking, and causual conversation  Cranial nerve II-XII: Pupils were equal round reactive to light extraocular movements were full, Visual field were full on confrontational test. Bilateral fundi were sharp.  Facial sensation and strength were normal. Hearing was intact to finger rubbing bilaterally. Uvula tongue midline.  head turning and shoulder shrug and were normal and symmetric.Tongue protrusion into cheek strength was normal.  Motor: normal tone, bulk and strength.  Sensory: Intact to fine touch, pinprick, preserved vibratory sensation, and proprioception at toes.  Coordination: Normal finger to nose, heel-to-shin bilaterally there was no truncal ataxia  Gait: Rising up from seated position  pushing on chair arm, right base, cautious gait   Romberg signs: Negative  Deep tendon reflexes: Brachioradialis 2/2, biceps 2/2, triceps 2/2, patellar 2/2, Achilles 2/2, plantar responses were flexor bilaterally.   DIAGNOSTIC DATA (LABS, IMAGING, TESTING) - I reviewed patient records, labs, notes, testing and imaging myself where available.  Lab  Results  Component Value Date   HGB 12.6 08/10/2009   HCT 37.0 08/10/2009      Component Value Date/Time   NA 142 08/10/2009 0839   K 4.3 08/10/2009 0839   GLUCOSE 95 08/10/2009 0839    ASSESSMENT AND PLAN   73 years old Caucasian female, with vascular risk factor of hypertension, hyperlipidemia, long-time smoker, presenting with acute onset of vertigo, unbalanced gait,  1 need to rule out brainstem cerebellum acute stroke 2 MRI of brain. MRA of brain and neck 3. aspirin 81 mg daily 4 continue physical therapy 5 return to clinic in one month.  Marcial Pacas, M.D. Ph.D.  [facility] [address] [place], [place] [address] [phone]

## 2013-04-30 ENCOUNTER — Encounter (INDEPENDENT_AMBULATORY_CARE_PROVIDER_SITE_OTHER): Payer: Medicare Other | Admitting: Ophthalmology

## 2013-04-30 DIAGNOSIS — H3530 Unspecified macular degeneration: Secondary | ICD-10-CM

## 2013-04-30 DIAGNOSIS — I10 Essential (primary) hypertension: Secondary | ICD-10-CM

## 2013-04-30 DIAGNOSIS — H35039 Hypertensive retinopathy, unspecified eye: Secondary | ICD-10-CM

## 2013-04-30 DIAGNOSIS — H2510 Age-related nuclear cataract, unspecified eye: Secondary | ICD-10-CM

## 2013-04-30 DIAGNOSIS — H353290 Exudative age-related macular degeneration, unspecified eye, stage unspecified: Secondary | ICD-10-CM

## 2013-04-30 DIAGNOSIS — H43819 Vitreous degeneration, unspecified eye: Secondary | ICD-10-CM

## 2013-05-01 ENCOUNTER — Ambulatory Visit: Payer: Medicare Other | Admitting: Rehabilitative and Restorative Service Providers"

## 2013-05-06 ENCOUNTER — Ambulatory Visit
Admission: RE | Admit: 2013-05-06 | Discharge: 2013-05-06 | Disposition: A | Discharge status: Home / Self Care | Payer: Medicare Other | Source: Ambulatory Visit | Attending: Neurology | Admitting: Neurology

## 2013-05-06 DIAGNOSIS — R269 Unspecified abnormalities of gait and mobility: Secondary | ICD-10-CM

## 2013-05-06 DIAGNOSIS — R42 Dizziness and giddiness: Secondary | ICD-10-CM

## 2013-05-06 MED ORDER — GADOBENATE DIMEGLUMINE 529 MG/ML IV SOLN
9.0000 mL | Freq: Once | INTRAVENOUS | Status: AC | PRN
  Administered 2013-05-06: 9 mL via INTRAVENOUS

## 2013-05-10 NOTE — Progress Notes (Signed)
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 No change from MRI on 08/09/12. MRA showed no large vessel disease  ______

## 2013-05-16 ENCOUNTER — Telehealth: Payer: Self-pay | Admitting: *Deleted

## 2013-05-17 NOTE — Telephone Encounter (Signed)
Called patient with results, she verbalized understanding

## 2013-05-28 ENCOUNTER — Encounter (INDEPENDENT_AMBULATORY_CARE_PROVIDER_SITE_OTHER): Payer: Medicare Other | Admitting: Ophthalmology

## 2013-05-31 ENCOUNTER — Encounter (INDEPENDENT_AMBULATORY_CARE_PROVIDER_SITE_OTHER): Payer: Medicare Other | Admitting: Ophthalmology

## 2013-05-31 DIAGNOSIS — H3530 Unspecified macular degeneration: Secondary | ICD-10-CM

## 2013-05-31 DIAGNOSIS — H353290 Exudative age-related macular degeneration, unspecified eye, stage unspecified: Secondary | ICD-10-CM

## 2013-05-31 DIAGNOSIS — H43819 Vitreous degeneration, unspecified eye: Secondary | ICD-10-CM

## 2013-05-31 DIAGNOSIS — H35039 Hypertensive retinopathy, unspecified eye: Secondary | ICD-10-CM

## 2013-05-31 DIAGNOSIS — H2510 Age-related nuclear cataract, unspecified eye: Secondary | ICD-10-CM

## 2013-05-31 DIAGNOSIS — I10 Essential (primary) hypertension: Secondary | ICD-10-CM

## 2013-05-31 NOTE — Progress Notes (Signed)
Quick Note:  Left message that MRA of neck was normal, per Dr. Krista Blue. ______

## 2013-07-05 ENCOUNTER — Encounter (INDEPENDENT_AMBULATORY_CARE_PROVIDER_SITE_OTHER): Payer: Medicare Other | Admitting: Ophthalmology

## 2013-07-19 ENCOUNTER — Encounter (INDEPENDENT_AMBULATORY_CARE_PROVIDER_SITE_OTHER): Payer: Medicare Other | Admitting: Ophthalmology

## 2013-07-19 DIAGNOSIS — H2510 Age-related nuclear cataract, unspecified eye: Secondary | ICD-10-CM

## 2013-07-19 DIAGNOSIS — H353290 Exudative age-related macular degeneration, unspecified eye, stage unspecified: Secondary | ICD-10-CM

## 2013-07-19 DIAGNOSIS — H3530 Unspecified macular degeneration: Secondary | ICD-10-CM

## 2013-07-19 DIAGNOSIS — H35039 Hypertensive retinopathy, unspecified eye: Secondary | ICD-10-CM

## 2013-07-19 DIAGNOSIS — I10 Essential (primary) hypertension: Secondary | ICD-10-CM

## 2013-07-19 DIAGNOSIS — H43819 Vitreous degeneration, unspecified eye: Secondary | ICD-10-CM

## 2013-08-02 ENCOUNTER — Ambulatory Visit: Payer: Medicare Other | Admitting: Nurse Practitioner

## 2013-08-21 ENCOUNTER — Encounter (INDEPENDENT_AMBULATORY_CARE_PROVIDER_SITE_OTHER): Payer: Medicare Other | Admitting: Ophthalmology

## 2013-08-21 DIAGNOSIS — I10 Essential (primary) hypertension: Secondary | ICD-10-CM

## 2013-08-21 DIAGNOSIS — H353290 Exudative age-related macular degeneration, unspecified eye, stage unspecified: Secondary | ICD-10-CM

## 2013-08-21 DIAGNOSIS — H43819 Vitreous degeneration, unspecified eye: Secondary | ICD-10-CM

## 2013-08-21 DIAGNOSIS — H35039 Hypertensive retinopathy, unspecified eye: Secondary | ICD-10-CM

## 2013-08-21 DIAGNOSIS — H2510 Age-related nuclear cataract, unspecified eye: Secondary | ICD-10-CM

## 2013-08-21 DIAGNOSIS — H3530 Unspecified macular degeneration: Secondary | ICD-10-CM

## 2013-09-18 ENCOUNTER — Encounter (INDEPENDENT_AMBULATORY_CARE_PROVIDER_SITE_OTHER): Payer: Medicare Other | Admitting: Ophthalmology

## 2013-09-18 DIAGNOSIS — H2510 Age-related nuclear cataract, unspecified eye: Secondary | ICD-10-CM

## 2013-09-18 DIAGNOSIS — I10 Essential (primary) hypertension: Secondary | ICD-10-CM

## 2013-09-18 DIAGNOSIS — H35039 Hypertensive retinopathy, unspecified eye: Secondary | ICD-10-CM

## 2013-09-18 DIAGNOSIS — H353290 Exudative age-related macular degeneration, unspecified eye, stage unspecified: Secondary | ICD-10-CM

## 2013-09-18 DIAGNOSIS — H3530 Unspecified macular degeneration: Secondary | ICD-10-CM

## 2013-09-18 DIAGNOSIS — H43819 Vitreous degeneration, unspecified eye: Secondary | ICD-10-CM

## 2013-10-16 ENCOUNTER — Encounter (INDEPENDENT_AMBULATORY_CARE_PROVIDER_SITE_OTHER): Payer: Medicare Other | Admitting: Ophthalmology

## 2013-10-22 ENCOUNTER — Encounter (INDEPENDENT_AMBULATORY_CARE_PROVIDER_SITE_OTHER): Payer: Medicare Other | Admitting: Ophthalmology

## 2013-10-22 DIAGNOSIS — H43819 Vitreous degeneration, unspecified eye: Secondary | ICD-10-CM

## 2013-10-22 DIAGNOSIS — I10 Essential (primary) hypertension: Secondary | ICD-10-CM

## 2013-10-22 DIAGNOSIS — H3530 Unspecified macular degeneration: Secondary | ICD-10-CM

## 2013-10-22 DIAGNOSIS — H35039 Hypertensive retinopathy, unspecified eye: Secondary | ICD-10-CM

## 2013-10-22 DIAGNOSIS — H353290 Exudative age-related macular degeneration, unspecified eye, stage unspecified: Secondary | ICD-10-CM

## 2013-11-19 ENCOUNTER — Encounter (INDEPENDENT_AMBULATORY_CARE_PROVIDER_SITE_OTHER): Payer: Medicare Other | Admitting: Ophthalmology

## 2013-11-19 DIAGNOSIS — I10 Essential (primary) hypertension: Secondary | ICD-10-CM

## 2013-11-19 DIAGNOSIS — H3530 Unspecified macular degeneration: Secondary | ICD-10-CM

## 2013-11-19 DIAGNOSIS — H43819 Vitreous degeneration, unspecified eye: Secondary | ICD-10-CM

## 2013-11-19 DIAGNOSIS — H35039 Hypertensive retinopathy, unspecified eye: Secondary | ICD-10-CM

## 2013-11-19 DIAGNOSIS — H353290 Exudative age-related macular degeneration, unspecified eye, stage unspecified: Secondary | ICD-10-CM

## 2013-12-17 ENCOUNTER — Encounter (INDEPENDENT_AMBULATORY_CARE_PROVIDER_SITE_OTHER): Payer: Medicare Other | Admitting: Ophthalmology

## 2013-12-17 DIAGNOSIS — H353290 Exudative age-related macular degeneration, unspecified eye, stage unspecified: Secondary | ICD-10-CM

## 2013-12-17 DIAGNOSIS — H35039 Hypertensive retinopathy, unspecified eye: Secondary | ICD-10-CM

## 2013-12-17 DIAGNOSIS — H2510 Age-related nuclear cataract, unspecified eye: Secondary | ICD-10-CM

## 2013-12-17 DIAGNOSIS — H3530 Unspecified macular degeneration: Secondary | ICD-10-CM

## 2013-12-17 DIAGNOSIS — H43819 Vitreous degeneration, unspecified eye: Secondary | ICD-10-CM

## 2013-12-17 DIAGNOSIS — I10 Essential (primary) hypertension: Secondary | ICD-10-CM

## 2014-01-14 ENCOUNTER — Encounter (INDEPENDENT_AMBULATORY_CARE_PROVIDER_SITE_OTHER): Payer: Medicare Other | Admitting: Ophthalmology

## 2014-01-14 DIAGNOSIS — I10 Essential (primary) hypertension: Secondary | ICD-10-CM

## 2014-01-14 DIAGNOSIS — H3532 Exudative age-related macular degeneration: Secondary | ICD-10-CM

## 2014-01-14 DIAGNOSIS — H3531 Nonexudative age-related macular degeneration: Secondary | ICD-10-CM

## 2014-01-14 DIAGNOSIS — H35033 Hypertensive retinopathy, bilateral: Secondary | ICD-10-CM

## 2014-01-14 DIAGNOSIS — H43813 Vitreous degeneration, bilateral: Secondary | ICD-10-CM

## 2014-02-11 ENCOUNTER — Encounter (INDEPENDENT_AMBULATORY_CARE_PROVIDER_SITE_OTHER): Payer: Medicare Other | Admitting: Ophthalmology

## 2014-02-11 DIAGNOSIS — H3532 Exudative age-related macular degeneration: Secondary | ICD-10-CM

## 2014-02-11 DIAGNOSIS — I10 Essential (primary) hypertension: Secondary | ICD-10-CM

## 2014-02-11 DIAGNOSIS — H43813 Vitreous degeneration, bilateral: Secondary | ICD-10-CM

## 2014-02-11 DIAGNOSIS — H3531 Nonexudative age-related macular degeneration: Secondary | ICD-10-CM

## 2014-02-11 DIAGNOSIS — H2511 Age-related nuclear cataract, right eye: Secondary | ICD-10-CM

## 2014-02-11 DIAGNOSIS — H35033 Hypertensive retinopathy, bilateral: Secondary | ICD-10-CM

## 2014-03-11 ENCOUNTER — Encounter (INDEPENDENT_AMBULATORY_CARE_PROVIDER_SITE_OTHER): Payer: Medicare Other | Admitting: Ophthalmology

## 2014-03-11 DIAGNOSIS — I10 Essential (primary) hypertension: Secondary | ICD-10-CM

## 2014-03-11 DIAGNOSIS — H3531 Nonexudative age-related macular degeneration: Secondary | ICD-10-CM

## 2014-03-11 DIAGNOSIS — H3532 Exudative age-related macular degeneration: Secondary | ICD-10-CM

## 2014-03-11 DIAGNOSIS — H35033 Hypertensive retinopathy, bilateral: Secondary | ICD-10-CM

## 2014-03-11 DIAGNOSIS — H43813 Vitreous degeneration, bilateral: Secondary | ICD-10-CM

## 2014-04-08 ENCOUNTER — Encounter (INDEPENDENT_AMBULATORY_CARE_PROVIDER_SITE_OTHER): Payer: Medicare Other | Admitting: Ophthalmology

## 2014-04-08 DIAGNOSIS — H43813 Vitreous degeneration, bilateral: Secondary | ICD-10-CM

## 2014-04-08 DIAGNOSIS — H35033 Hypertensive retinopathy, bilateral: Secondary | ICD-10-CM

## 2014-04-08 DIAGNOSIS — H3532 Exudative age-related macular degeneration: Secondary | ICD-10-CM

## 2014-04-08 DIAGNOSIS — I10 Essential (primary) hypertension: Secondary | ICD-10-CM

## 2014-04-08 DIAGNOSIS — H2511 Age-related nuclear cataract, right eye: Secondary | ICD-10-CM | POA: Diagnosis not present

## 2014-04-08 DIAGNOSIS — H3531 Nonexudative age-related macular degeneration: Secondary | ICD-10-CM

## 2014-05-02 DIAGNOSIS — H2513 Age-related nuclear cataract, bilateral: Secondary | ICD-10-CM | POA: Diagnosis not present

## 2014-05-02 DIAGNOSIS — H3532 Exudative age-related macular degeneration: Secondary | ICD-10-CM | POA: Diagnosis not present

## 2014-05-06 ENCOUNTER — Encounter (INDEPENDENT_AMBULATORY_CARE_PROVIDER_SITE_OTHER): Payer: Medicare Other | Admitting: Ophthalmology

## 2014-05-06 DIAGNOSIS — I10 Essential (primary) hypertension: Secondary | ICD-10-CM | POA: Diagnosis not present

## 2014-05-06 DIAGNOSIS — H3531 Nonexudative age-related macular degeneration: Secondary | ICD-10-CM | POA: Diagnosis not present

## 2014-05-06 DIAGNOSIS — H35033 Hypertensive retinopathy, bilateral: Secondary | ICD-10-CM | POA: Diagnosis not present

## 2014-05-06 DIAGNOSIS — H43813 Vitreous degeneration, bilateral: Secondary | ICD-10-CM | POA: Diagnosis not present

## 2014-05-06 DIAGNOSIS — H2511 Age-related nuclear cataract, right eye: Secondary | ICD-10-CM | POA: Diagnosis not present

## 2014-05-06 DIAGNOSIS — H3532 Exudative age-related macular degeneration: Secondary | ICD-10-CM

## 2014-06-02 DIAGNOSIS — I10 Essential (primary) hypertension: Secondary | ICD-10-CM | POA: Diagnosis not present

## 2014-06-02 DIAGNOSIS — F17200 Nicotine dependence, unspecified, uncomplicated: Secondary | ICD-10-CM | POA: Diagnosis not present

## 2014-06-02 DIAGNOSIS — E785 Hyperlipidemia, unspecified: Secondary | ICD-10-CM | POA: Diagnosis not present

## 2014-06-03 ENCOUNTER — Encounter (INDEPENDENT_AMBULATORY_CARE_PROVIDER_SITE_OTHER): Payer: Medicare Other | Admitting: Ophthalmology

## 2014-06-03 DIAGNOSIS — H3531 Nonexudative age-related macular degeneration: Secondary | ICD-10-CM | POA: Diagnosis not present

## 2014-06-03 DIAGNOSIS — H35033 Hypertensive retinopathy, bilateral: Secondary | ICD-10-CM

## 2014-06-03 DIAGNOSIS — H3532 Exudative age-related macular degeneration: Secondary | ICD-10-CM

## 2014-06-03 DIAGNOSIS — I10 Essential (primary) hypertension: Secondary | ICD-10-CM

## 2014-06-03 DIAGNOSIS — H43813 Vitreous degeneration, bilateral: Secondary | ICD-10-CM

## 2014-06-26 ENCOUNTER — Encounter (INDEPENDENT_AMBULATORY_CARE_PROVIDER_SITE_OTHER): Payer: Medicare Other | Admitting: Ophthalmology

## 2014-06-26 DIAGNOSIS — H35033 Hypertensive retinopathy, bilateral: Secondary | ICD-10-CM | POA: Diagnosis not present

## 2014-06-26 DIAGNOSIS — H43813 Vitreous degeneration, bilateral: Secondary | ICD-10-CM

## 2014-06-26 DIAGNOSIS — H3531 Nonexudative age-related macular degeneration: Secondary | ICD-10-CM | POA: Diagnosis not present

## 2014-06-26 DIAGNOSIS — H3532 Exudative age-related macular degeneration: Secondary | ICD-10-CM | POA: Diagnosis not present

## 2014-06-26 DIAGNOSIS — H2511 Age-related nuclear cataract, right eye: Secondary | ICD-10-CM

## 2014-06-26 DIAGNOSIS — I10 Essential (primary) hypertension: Secondary | ICD-10-CM

## 2014-07-31 ENCOUNTER — Encounter (INDEPENDENT_AMBULATORY_CARE_PROVIDER_SITE_OTHER): Payer: Medicare Other | Admitting: Ophthalmology

## 2014-08-05 ENCOUNTER — Encounter (INDEPENDENT_AMBULATORY_CARE_PROVIDER_SITE_OTHER): Payer: Medicare Other | Admitting: Ophthalmology

## 2014-08-05 DIAGNOSIS — H35033 Hypertensive retinopathy, bilateral: Secondary | ICD-10-CM

## 2014-08-05 DIAGNOSIS — H43813 Vitreous degeneration, bilateral: Secondary | ICD-10-CM | POA: Diagnosis not present

## 2014-08-05 DIAGNOSIS — H2511 Age-related nuclear cataract, right eye: Secondary | ICD-10-CM | POA: Diagnosis not present

## 2014-08-05 DIAGNOSIS — H3531 Nonexudative age-related macular degeneration: Secondary | ICD-10-CM

## 2014-08-05 DIAGNOSIS — I10 Essential (primary) hypertension: Secondary | ICD-10-CM

## 2014-08-05 DIAGNOSIS — H3532 Exudative age-related macular degeneration: Secondary | ICD-10-CM | POA: Diagnosis not present

## 2014-09-09 ENCOUNTER — Encounter (INDEPENDENT_AMBULATORY_CARE_PROVIDER_SITE_OTHER): Payer: Medicare Other | Admitting: Ophthalmology

## 2014-09-09 DIAGNOSIS — I10 Essential (primary) hypertension: Secondary | ICD-10-CM | POA: Diagnosis not present

## 2014-09-09 DIAGNOSIS — H35033 Hypertensive retinopathy, bilateral: Secondary | ICD-10-CM

## 2014-09-09 DIAGNOSIS — H3531 Nonexudative age-related macular degeneration: Secondary | ICD-10-CM | POA: Diagnosis not present

## 2014-09-09 DIAGNOSIS — H3532 Exudative age-related macular degeneration: Secondary | ICD-10-CM | POA: Diagnosis not present

## 2014-09-09 DIAGNOSIS — H43813 Vitreous degeneration, bilateral: Secondary | ICD-10-CM

## 2014-10-13 DIAGNOSIS — H25041 Posterior subcapsular polar age-related cataract, right eye: Secondary | ICD-10-CM | POA: Diagnosis not present

## 2014-10-13 DIAGNOSIS — H25011 Cortical age-related cataract, right eye: Secondary | ICD-10-CM | POA: Diagnosis not present

## 2014-10-13 DIAGNOSIS — H2511 Age-related nuclear cataract, right eye: Secondary | ICD-10-CM | POA: Diagnosis not present

## 2014-10-13 DIAGNOSIS — H2512 Age-related nuclear cataract, left eye: Secondary | ICD-10-CM | POA: Diagnosis not present

## 2014-10-14 ENCOUNTER — Encounter (INDEPENDENT_AMBULATORY_CARE_PROVIDER_SITE_OTHER): Payer: Medicare Other | Admitting: Ophthalmology

## 2014-10-14 DIAGNOSIS — H35033 Hypertensive retinopathy, bilateral: Secondary | ICD-10-CM | POA: Diagnosis not present

## 2014-10-14 DIAGNOSIS — H3531 Nonexudative age-related macular degeneration: Secondary | ICD-10-CM

## 2014-10-14 DIAGNOSIS — I10 Essential (primary) hypertension: Secondary | ICD-10-CM

## 2014-10-14 DIAGNOSIS — H3532 Exudative age-related macular degeneration: Secondary | ICD-10-CM | POA: Diagnosis not present

## 2014-10-14 DIAGNOSIS — H43813 Vitreous degeneration, bilateral: Secondary | ICD-10-CM

## 2014-10-16 DIAGNOSIS — E780 Pure hypercholesterolemia: Secondary | ICD-10-CM | POA: Diagnosis not present

## 2014-10-16 DIAGNOSIS — I10 Essential (primary) hypertension: Secondary | ICD-10-CM | POA: Diagnosis not present

## 2014-10-16 DIAGNOSIS — K219 Gastro-esophageal reflux disease without esophagitis: Secondary | ICD-10-CM | POA: Diagnosis not present

## 2014-10-16 DIAGNOSIS — K589 Irritable bowel syndrome without diarrhea: Secondary | ICD-10-CM | POA: Diagnosis not present

## 2014-10-16 DIAGNOSIS — F419 Anxiety disorder, unspecified: Secondary | ICD-10-CM | POA: Diagnosis not present

## 2014-10-16 DIAGNOSIS — J449 Chronic obstructive pulmonary disease, unspecified: Secondary | ICD-10-CM | POA: Diagnosis not present

## 2014-10-16 DIAGNOSIS — F17200 Nicotine dependence, unspecified, uncomplicated: Secondary | ICD-10-CM | POA: Diagnosis not present

## 2014-10-19 ENCOUNTER — Encounter: Payer: Self-pay | Admitting: *Deleted

## 2014-10-19 ENCOUNTER — Other Ambulatory Visit: Payer: Self-pay

## 2014-10-19 ENCOUNTER — Emergency Department
Admission: EM | Admit: 2014-10-19 | Discharge: 2014-10-19 | Disposition: A | Discharge status: Home / Self Care | Payer: Medicare Other | Attending: Emergency Medicine | Admitting: Emergency Medicine

## 2014-10-19 DIAGNOSIS — Z720 Tobacco use: Secondary | ICD-10-CM | POA: Diagnosis not present

## 2014-10-19 DIAGNOSIS — R55 Syncope and collapse: Secondary | ICD-10-CM | POA: Diagnosis not present

## 2014-10-19 DIAGNOSIS — Z7982 Long term (current) use of aspirin: Secondary | ICD-10-CM | POA: Diagnosis not present

## 2014-10-19 DIAGNOSIS — Z79899 Other long term (current) drug therapy: Secondary | ICD-10-CM | POA: Insufficient documentation

## 2014-10-19 DIAGNOSIS — Z7951 Long term (current) use of inhaled steroids: Secondary | ICD-10-CM | POA: Insufficient documentation

## 2014-10-19 DIAGNOSIS — I10 Essential (primary) hypertension: Secondary | ICD-10-CM | POA: Diagnosis not present

## 2014-10-19 DIAGNOSIS — R110 Nausea: Secondary | ICD-10-CM | POA: Diagnosis not present

## 2014-10-19 DIAGNOSIS — M6281 Muscle weakness (generalized): Secondary | ICD-10-CM | POA: Diagnosis not present

## 2014-10-19 LAB — COMPREHENSIVE METABOLIC PANEL
ALK PHOS: 81 U/L (ref 38–126)
ALT: 16 U/L (ref 14–54)
ANION GAP: 7 (ref 5–15)
AST: 26 U/L (ref 15–41)
Albumin: 3.9 g/dL (ref 3.5–5.0)
BUN: 15 mg/dL (ref 6–20)
CALCIUM: 9 mg/dL (ref 8.9–10.3)
CHLORIDE: 104 mmol/L (ref 101–111)
CO2: 28 mmol/L (ref 22–32)
Creatinine, Ser: 1.1 mg/dL — ABNORMAL HIGH (ref 0.44–1.00)
GFR calc Af Amer: 56 mL/min — ABNORMAL LOW (ref >60)
GFR calc non Af Amer: 49 mL/min — ABNORMAL LOW (ref >60)
Glucose, Bld: 124 mg/dL — ABNORMAL HIGH (ref 65–99)
POTASSIUM: 3.3 mmol/L — AB (ref 3.5–5.1)
Sodium: 139 mmol/L (ref 135–145)
Total Bilirubin: 0.4 mg/dL (ref 0.3–1.2)
Total Protein: 6.8 g/dL (ref 6.5–8.1)

## 2014-10-19 LAB — TROPONIN I: Troponin I: <0.03 ng/mL (ref <0.031)

## 2014-10-19 LAB — CBC
HCT: 37.6 % (ref 35.0–47.0)
HEMOGLOBIN: 12.3 g/dL (ref 12.0–16.0)
MCH: 26.8 pg (ref 26.0–34.0)
MCHC: 32.7 g/dL (ref 32.0–36.0)
MCV: 81.9 fL (ref 80.0–100.0)
Platelets: 235 10*3/uL (ref 150–440)
RBC: 4.58 MIL/uL (ref 3.80–5.20)
RDW: 15 % — ABNORMAL HIGH (ref 11.5–14.5)
WBC: 5.8 10*3/uL (ref 3.6–11.0)

## 2014-10-19 MED ORDER — SODIUM CHLORIDE 0.9 % IV SOLN
1000.0000 mL | Freq: Once | INTRAVENOUS | Status: AC
  Administered 2014-10-19: 1000 mL via INTRAVENOUS

## 2014-10-19 NOTE — ED Provider Notes (Signed)
[facility] Emergency Department Provider Note  ____________________________________________  Time seen: On arrival, via EMS  I have reviewed the triage vital signs and the nursing notes.   HISTORY  Chief Complaint Near Syncope    HPI Gina Fritz is a 74 y.o. female who presents after a near-syncopal episode. Patient reports she was out in the heat and high humidity for approximately 10 minutes she felt herself becoming lightheaded and nauseous she had to sit down and rest. EMS was called blood glucose was 175. Patient denies chest pain or palpitations. No shortness of breath. No headache. No focal deficits. She reports feeling improved now while lying in bed. EMS reports low blood pressure upon arrival     Past Medical History  Diagnosis Date  . Hypertension   . Depression   . Allergic rhinitis   . Rash   . Chronic ethmoidal sinusitis   . Chronic maxillary sinusitis   . Chronic airway obstruction, not elsewhere classified   . Irritable bowel syndrome   . Unspecified protein-calorie malnutrition   . Pure hypercholesterolemia   . Sensorineural hearing loss, unspecified   . Sensorineural hearing loss, unilateral   . Subjective tinnitus   . Tobacco use disorder   . Loss of weight   . Dizziness     Patient Active Problem List   Diagnosis Date Noted  . Dizziness and giddiness 04/22/2013  . Abnormality of gait 04/22/2013  . Dizziness     Past Surgical History  Procedure Laterality Date  . Nasal sinus surgery    . Appendectomy    . Rhinologic surgery    . Tubal ligation    . Cataract extraction Left     Current Outpatient Rx  Name  Route  Sig  Dispense  Refill  . hydrochlorothiazide (HYDRODIURIL) 25 MG tablet   Oral   Take 25 mg by mouth daily.         Marland Kitchen amLODipine (NORVASC) 5 MG tablet   Oral   Take 5 mg by mouth daily.         Marland Kitchen aspirin EC 81 MG tablet   Oral   Take 81 mg by mouth daily.         Marland Kitchen atorvastatin  (LIPITOR) 40 MG tablet   Oral   Take 40 mg by mouth daily.         . DONNATAL 16.2 MG tablet      16.2 tablets daily.         Marland Kitchen escitalopram (LEXAPRO) 20 MG tablet               . fluticasone (FLONASE) 50 MCG/ACT nasal spray   Nasal   Place 2 sprays into the nose daily.         Marland Kitchen loperamide (IMODIUM) 2 MG capsule   Oral   Take 2 mg by mouth 4 (four) times daily as needed. For upset stomach         . losartan (COZAAR) 50 MG tablet   Oral   Take 50 mg by mouth daily.         . meclizine (ANTIVERT) 25 MG tablet      25 mg daily.         . mometasone (ASMANEX) 220 MCG/INH inhaler   Inhalation   Inhale 2 puffs into the lungs daily.         . montelukast (SINGULAIR) 10 MG tablet   Oral   Take 10 mg by mouth at bedtime.         Marland Kitchen  Multiple Vitamin (MULTIVITAMIN WITH MINERALS) TABS   Oral   Take 1 tablet by mouth daily.         . naproxen sodium (ANAPROX) 220 MG tablet   Oral   Take 220 mg by mouth daily. For pain         . ranitidine (ZANTAC) 300 MG tablet   Oral   Take 300 mg by mouth 2 (two) times daily.         . solifenacin (VESICARE) 10 MG tablet   Oral   Take 5 mg by mouth daily.           Allergies Penicillins and Sulfonamide derivatives  Family History  Problem Relation Age of Onset  . Congestive Heart Failure Mother   . Hearing loss Mother   . Hypertension Father   . Stroke Mother     syndrome  . Other      thyroid disorder    Social History History  Substance Use Topics  . Smoking status: Current Every Day Smoker -- 1.00 packs/day for 25 years    Types: Cigarettes  . Smokeless tobacco: Never Used  . Alcohol Use: 0.6 oz/week    1 Glasses of wine per week     Comment: One drink a week.    Review of Systems  Constitutional: Negative for fever. Eyes: Negative for visual changes. ENT: Negative for sore throat Cardiovascular: Negative for chest pain. Respiratory: Negative for shortness of  breath. Gastrointestinal: Negative for abdominal pain, vomiting and diarrhea. Genitourinary: Negative for dysuria. Musculoskeletal: Negative for back pain. Skin: Negative for rash. Neurological: Negative for headaches or focal weakness Psychiatric: No anxiety  10-point ROS otherwise negative.  ____________________________________________   PHYSICAL EXAM:  VITAL SIGNS: ED Triage Vitals  Enc Vitals Group     BP 10/19/14 1529 95/57 mmHg     Pulse Rate 10/19/14 1529 56     Resp --      Temp 10/19/14 1529 97.5 F (36.4 C)     Temp Source 10/19/14 1529 Oral     SpO2 10/19/14 1529 98 %     Weight 10/19/14 1529 107 lb (48.535 kg)     Height 10/19/14 1529 5\' 4"  (1.626 m)     Head Cir --      Peak Flow --      Pain Score --      Pain Loc --      Pain Edu? --      Excl. in [place]? --      Constitutional: Alert and oriented. Well appearing and in no distress. Eyes: Conjunctivae are normal.  ENT   Head: Normocephalic and atraumatic.   Mouth/Throat: Mucous membranes are moist. Cardiovascular: Normal rate, regular rhythm. Normal and symmetric distal pulses are present in all extremities. No murmurs, rubs, or gallops. Respiratory: Normal respiratory effort without tachypnea nor retractions. Breath sounds are clear and equal bilaterally.  Gastrointestinal: Soft and non-tender in all quadrants. No distention. There is no CVA tenderness. Genitourinary: deferred Musculoskeletal: Nontender with normal range of motion in all extremities. No lower extremity tenderness nor edema. Neurologic:  Normal speech and language. No gross focal neurologic deficits are appreciated. Skin:  Skin is warm, dry and intact. No rash noted. Psychiatric: Mood and affect are normal. Patient exhibits appropriate insight and judgment.  ____________________________________________    LABS (pertinent positives/negatives)  Labs Reviewed  CBC - Abnormal; Notable for the following:    RDW 15.0 (*)    All  other components within normal limits  COMPREHENSIVE METABOLIC PANEL  TROPONIN I    ____________________________________________   EKG  ED ECG REPORT I, Lavonia Drafts, the attending physician, personally viewed and interpreted this ECG.   Date: 10/19/2014  EKG Time: 3:26 PM   Rate: 55  Rhythm: sinus bradycardia  Axis: Normal axis  Intervals:none  ST&T Change: Nonspecific T wave abnormalities   ____________________________________________    RADIOLOGY I have personally reviewed any xrays that were ordered on this patient: None  ____________________________________________   PROCEDURES  Procedure(s) performed: none  Critical Care performed: none  ____________________________________________   INITIAL IMPRESSION / ASSESSMENT AND PLAN / ED COURSE  Pertinent labs & imaging results that were available during my care of the patient were reviewed by me and considered in my medical decision making (see chart for details).  Patient well-appearing in the emergency department although blood pressure is slightly low 90s over 60s. We will give IV fluids, check labs, EKG and reassess. This may be related to heat exhaustion.  _______________  ----------------------------------------- 5:44 PM on 10/19/2014 -----------------------------------------  Blood pressure is increased with IV fluids to 128/85. Orthostatics checked and are normal. Patient feels well no more lightheadedness. Okay for discharge with pcp follow up  _____________________________   FINAL CLINICAL IMPRESSION(S) / ED DIAGNOSES  Final diagnoses:  Near syncope     Lavonia Drafts, MD 10/19/14 1744

## 2014-10-19 NOTE — Discharge Instructions (Signed)
Near-Syncope Near-syncope (commonly known as near fainting) is sudden weakness, dizziness, or feeling like you might pass out. During an episode of near-syncope, you may also develop pale skin, have tunnel vision, or feel sick to your stomach (nauseous). Near-syncope may occur when getting up after sitting or while standing for a long time. It is caused by a sudden decrease in blood flow to the brain. This decrease can result from various causes or triggers, most of which are not serious. However, because near-syncope can sometimes be a sign of something serious, a medical evaluation is required. The specific cause is often not determined. HOME CARE INSTRUCTIONS  Monitor your condition for any changes. The following actions may help to alleviate any discomfort you are experiencing:  Have someone stay with you until you feel stable.  Lie down right away and prop your feet up if you start feeling like you might faint. Breathe deeply and steadily. Wait until all the symptoms have passed. Most of these episodes last only a few minutes. You may feel tired for several hours.   Drink enough fluids to keep your urine clear or pale yellow.   If you are taking blood pressure or heart medicine, get up slowly when seated or lying down. Take several minutes to sit and then stand. This can reduce dizziness.  Follow up with your health care provider as directed. SEEK IMMEDIATE MEDICAL CARE IF:   You have a severe headache.   You have unusual pain in the chest, abdomen, or back.   You are bleeding from the mouth or rectum, or you have black or tarry stool.   You have an irregular or very fast heartbeat.   You have repeated fainting or have seizure-like jerking during an episode.   You faint when sitting or lying down.   You have confusion.   You have difficulty walking.   You have severe weakness.   You have vision problems.  MAKE SURE YOU:   Understand these instructions.  Will  watch your condition.  Will get help right away if you are not doing well or get worse. Document Released: 03/21/2005 Document Revised: 03/26/2013 Document Reviewed: 08/24/2012 Exit[employer] Patient Information 2015 [employer]. This information is not intended to replace advice given to you by your health care provider. Make sure you discuss any questions you have with your health care provider.  

## 2014-10-19 NOTE — ED Notes (Addendum)
[facility] a near syncopal episode. Pt reports this to be the second occurrence. The first reported to be three years ago. Pt reports sudden onset of feeling weak and nauseous. Pt reports vomiting multiple times. Pt denies passing out and denies hitting head. Pt speaking slowly upon arrival with large pauses in speech. Pts blood glucose was 175 per EMS. Reports abd pain in pelvic area before arrival to ED. Denies pain at this time. Pt has low blood pressure of 95/57 upon arrival and reportedly unreadably low BP per EMS. Pt alert and Oriented upon arrival. Face symmetrical.

## 2014-10-28 DIAGNOSIS — I10 Essential (primary) hypertension: Secondary | ICD-10-CM | POA: Diagnosis not present

## 2014-10-28 DIAGNOSIS — E780 Pure hypercholesterolemia: Secondary | ICD-10-CM | POA: Diagnosis not present

## 2014-11-11 DIAGNOSIS — H2511 Age-related nuclear cataract, right eye: Secondary | ICD-10-CM | POA: Diagnosis not present

## 2014-11-18 ENCOUNTER — Encounter (INDEPENDENT_AMBULATORY_CARE_PROVIDER_SITE_OTHER): Payer: Medicare Other | Admitting: Ophthalmology

## 2014-11-18 DIAGNOSIS — H35033 Hypertensive retinopathy, bilateral: Secondary | ICD-10-CM

## 2014-11-18 DIAGNOSIS — H43813 Vitreous degeneration, bilateral: Secondary | ICD-10-CM | POA: Diagnosis not present

## 2014-11-18 DIAGNOSIS — H2511 Age-related nuclear cataract, right eye: Secondary | ICD-10-CM | POA: Diagnosis not present

## 2014-11-18 DIAGNOSIS — H3531 Nonexudative age-related macular degeneration: Secondary | ICD-10-CM | POA: Diagnosis not present

## 2014-11-18 DIAGNOSIS — H3532 Exudative age-related macular degeneration: Secondary | ICD-10-CM | POA: Diagnosis not present

## 2014-11-18 DIAGNOSIS — I10 Essential (primary) hypertension: Secondary | ICD-10-CM

## 2014-11-21 ENCOUNTER — Ambulatory Visit (INDEPENDENT_AMBULATORY_CARE_PROVIDER_SITE_OTHER): Payer: Medicare Other | Admitting: Ophthalmology

## 2014-12-01 DIAGNOSIS — F17200 Nicotine dependence, unspecified, uncomplicated: Secondary | ICD-10-CM | POA: Diagnosis not present

## 2014-12-01 DIAGNOSIS — E785 Hyperlipidemia, unspecified: Secondary | ICD-10-CM | POA: Diagnosis not present

## 2014-12-01 DIAGNOSIS — I10 Essential (primary) hypertension: Secondary | ICD-10-CM | POA: Diagnosis not present

## 2014-12-03 DIAGNOSIS — Z0000 Encounter for general adult medical examination without abnormal findings: Secondary | ICD-10-CM | POA: Diagnosis not present

## 2014-12-22 ENCOUNTER — Encounter (INDEPENDENT_AMBULATORY_CARE_PROVIDER_SITE_OTHER): Payer: Medicare Other | Admitting: Ophthalmology

## 2014-12-22 DIAGNOSIS — H3531 Nonexudative age-related macular degeneration: Secondary | ICD-10-CM

## 2014-12-22 DIAGNOSIS — H3532 Exudative age-related macular degeneration: Secondary | ICD-10-CM | POA: Diagnosis not present

## 2014-12-22 DIAGNOSIS — H43813 Vitreous degeneration, bilateral: Secondary | ICD-10-CM | POA: Diagnosis not present

## 2014-12-22 DIAGNOSIS — I10 Essential (primary) hypertension: Secondary | ICD-10-CM

## 2014-12-22 DIAGNOSIS — H35033 Hypertensive retinopathy, bilateral: Secondary | ICD-10-CM | POA: Diagnosis not present

## 2015-01-20 DIAGNOSIS — R102 Pelvic and perineal pain: Secondary | ICD-10-CM | POA: Diagnosis not present

## 2015-01-20 DIAGNOSIS — R310 Gross hematuria: Secondary | ICD-10-CM | POA: Diagnosis not present

## 2015-01-21 DIAGNOSIS — R310 Gross hematuria: Secondary | ICD-10-CM | POA: Diagnosis not present

## 2015-01-21 DIAGNOSIS — R3129 Other microscopic hematuria: Secondary | ICD-10-CM | POA: Diagnosis not present

## 2015-01-26 ENCOUNTER — Encounter (INDEPENDENT_AMBULATORY_CARE_PROVIDER_SITE_OTHER): Payer: Medicare Other | Admitting: Ophthalmology

## 2015-01-28 DIAGNOSIS — N939 Abnormal uterine and vaginal bleeding, unspecified: Secondary | ICD-10-CM | POA: Diagnosis not present

## 2015-01-28 DIAGNOSIS — R102 Pelvic and perineal pain: Secondary | ICD-10-CM | POA: Diagnosis not present

## 2015-02-02 ENCOUNTER — Encounter (INDEPENDENT_AMBULATORY_CARE_PROVIDER_SITE_OTHER): Payer: Medicare Other | Admitting: Ophthalmology

## 2015-02-02 DIAGNOSIS — H353124 Nonexudative age-related macular degeneration, left eye, advanced atrophic with subfoveal involvement: Secondary | ICD-10-CM

## 2015-02-02 DIAGNOSIS — H43813 Vitreous degeneration, bilateral: Secondary | ICD-10-CM | POA: Diagnosis not present

## 2015-02-02 DIAGNOSIS — H35033 Hypertensive retinopathy, bilateral: Secondary | ICD-10-CM | POA: Diagnosis not present

## 2015-02-02 DIAGNOSIS — I10 Essential (primary) hypertension: Secondary | ICD-10-CM | POA: Diagnosis not present

## 2015-02-02 DIAGNOSIS — H353211 Exudative age-related macular degeneration, right eye, with active choroidal neovascularization: Secondary | ICD-10-CM | POA: Diagnosis not present

## 2015-02-03 DIAGNOSIS — R102 Pelvic and perineal pain: Secondary | ICD-10-CM | POA: Diagnosis not present

## 2015-02-03 DIAGNOSIS — R278 Other lack of coordination: Secondary | ICD-10-CM | POA: Diagnosis not present

## 2015-02-03 DIAGNOSIS — R350 Frequency of micturition: Secondary | ICD-10-CM | POA: Diagnosis not present

## 2015-02-03 DIAGNOSIS — M62838 Other muscle spasm: Secondary | ICD-10-CM | POA: Diagnosis not present

## 2015-02-05 ENCOUNTER — Inpatient Hospital Stay (HOSPITAL_COMMUNITY): Payer: Medicare Other

## 2015-02-05 ENCOUNTER — Inpatient Hospital Stay (HOSPITAL_COMMUNITY)
Admission: EM | Admit: 2015-02-05 | Discharge: 2015-02-10 | DRG: 193 | Disposition: A | Discharge status: Home / Self Care | Payer: Medicare Other | Attending: Family Medicine | Admitting: Family Medicine

## 2015-02-05 ENCOUNTER — Encounter (HOSPITAL_COMMUNITY): Payer: Self-pay | Admitting: Emergency Medicine

## 2015-02-05 ENCOUNTER — Emergency Department (HOSPITAL_COMMUNITY): Payer: Medicare Other

## 2015-02-05 DIAGNOSIS — N939 Abnormal uterine and vaginal bleeding, unspecified: Secondary | ICD-10-CM | POA: Diagnosis present

## 2015-02-05 DIAGNOSIS — F17210 Nicotine dependence, cigarettes, uncomplicated: Secondary | ICD-10-CM | POA: Diagnosis present

## 2015-02-05 DIAGNOSIS — J189 Pneumonia, unspecified organism: Secondary | ICD-10-CM | POA: Diagnosis not present

## 2015-02-05 DIAGNOSIS — J449 Chronic obstructive pulmonary disease, unspecified: Secondary | ICD-10-CM | POA: Diagnosis present

## 2015-02-05 DIAGNOSIS — Z66 Do not resuscitate: Secondary | ICD-10-CM | POA: Diagnosis present

## 2015-02-05 DIAGNOSIS — Z79899 Other long term (current) drug therapy: Secondary | ICD-10-CM | POA: Diagnosis not present

## 2015-02-05 DIAGNOSIS — N201 Calculus of ureter: Secondary | ICD-10-CM | POA: Diagnosis not present

## 2015-02-05 DIAGNOSIS — Z87442 Personal history of urinary calculi: Secondary | ICD-10-CM

## 2015-02-05 DIAGNOSIS — J9601 Acute respiratory failure with hypoxia: Secondary | ICD-10-CM | POA: Diagnosis not present

## 2015-02-05 DIAGNOSIS — N132 Hydronephrosis with renal and ureteral calculous obstruction: Secondary | ICD-10-CM | POA: Diagnosis present

## 2015-02-05 DIAGNOSIS — M549 Dorsalgia, unspecified: Secondary | ICD-10-CM

## 2015-02-05 DIAGNOSIS — R310 Gross hematuria: Secondary | ICD-10-CM | POA: Diagnosis present

## 2015-02-05 DIAGNOSIS — Z880 Allergy status to penicillin: Secondary | ICD-10-CM | POA: Diagnosis not present

## 2015-02-05 DIAGNOSIS — M542 Cervicalgia: Secondary | ICD-10-CM | POA: Diagnosis not present

## 2015-02-05 DIAGNOSIS — N200 Calculus of kidney: Secondary | ICD-10-CM

## 2015-02-05 DIAGNOSIS — H905 Unspecified sensorineural hearing loss: Secondary | ICD-10-CM | POA: Diagnosis present

## 2015-02-05 DIAGNOSIS — R042 Hemoptysis: Secondary | ICD-10-CM | POA: Diagnosis not present

## 2015-02-05 DIAGNOSIS — J90 Pleural effusion, not elsewhere classified: Secondary | ICD-10-CM | POA: Diagnosis not present

## 2015-02-05 DIAGNOSIS — I10 Essential (primary) hypertension: Secondary | ICD-10-CM | POA: Diagnosis present

## 2015-02-05 DIAGNOSIS — K529 Noninfective gastroenteritis and colitis, unspecified: Secondary | ICD-10-CM | POA: Diagnosis not present

## 2015-02-05 DIAGNOSIS — Z823 Family history of stroke: Secondary | ICD-10-CM

## 2015-02-05 DIAGNOSIS — Z8249 Family history of ischemic heart disease and other diseases of the circulatory system: Secondary | ICD-10-CM

## 2015-02-05 DIAGNOSIS — R1084 Generalized abdominal pain: Secondary | ICD-10-CM | POA: Diagnosis not present

## 2015-02-05 DIAGNOSIS — R319 Hematuria, unspecified: Secondary | ICD-10-CM

## 2015-02-05 DIAGNOSIS — Z681 Body mass index (BMI) 19 or less, adult: Secondary | ICD-10-CM | POA: Diagnosis not present

## 2015-02-05 DIAGNOSIS — E440 Moderate protein-calorie malnutrition: Secondary | ICD-10-CM | POA: Diagnosis present

## 2015-02-05 DIAGNOSIS — R05 Cough: Secondary | ICD-10-CM | POA: Diagnosis not present

## 2015-02-05 DIAGNOSIS — Z7982 Long term (current) use of aspirin: Secondary | ICD-10-CM

## 2015-02-05 DIAGNOSIS — E7800 Pure hypercholesterolemia, unspecified: Secondary | ICD-10-CM | POA: Diagnosis present

## 2015-02-05 DIAGNOSIS — J439 Emphysema, unspecified: Secondary | ICD-10-CM | POA: Diagnosis not present

## 2015-02-05 DIAGNOSIS — R109 Unspecified abdominal pain: Secondary | ICD-10-CM

## 2015-02-05 DIAGNOSIS — R748 Abnormal levels of other serum enzymes: Secondary | ICD-10-CM

## 2015-02-05 DIAGNOSIS — E876 Hypokalemia: Secondary | ICD-10-CM | POA: Diagnosis present

## 2015-02-05 LAB — URINALYSIS, ROUTINE W REFLEX MICROSCOPIC
BILIRUBIN URINE: NEGATIVE
Glucose, UA: NEGATIVE mg/dL
Ketones, ur: NEGATIVE mg/dL
Nitrite: NEGATIVE
PH: 5 (ref 5.0–8.0)
Protein, ur: 100 mg/dL — AB
Specific Gravity, Urine: 1.038 — ABNORMAL HIGH (ref 1.005–1.030)
Urobilinogen, UA: 1 mg/dL (ref 0.0–1.0)

## 2015-02-05 LAB — COMPREHENSIVE METABOLIC PANEL
ALK PHOS: 139 U/L — AB (ref 38–126)
ALT: 55 U/L — ABNORMAL HIGH (ref 14–54)
ANION GAP: 9 (ref 5–15)
AST: 66 U/L — ABNORMAL HIGH (ref 15–41)
Albumin: 3.8 g/dL (ref 3.5–5.0)
BILIRUBIN TOTAL: 1.3 mg/dL — AB (ref 0.3–1.2)
BUN: 18 mg/dL (ref 6–20)
CHLORIDE: 99 mmol/L — AB (ref 101–111)
CO2: 28 mmol/L (ref 22–32)
Calcium: 10.1 mg/dL (ref 8.9–10.3)
Creatinine, Ser: 1.3 mg/dL — ABNORMAL HIGH (ref 0.44–1.00)
GFR calc Af Amer: 46 mL/min — ABNORMAL LOW (ref >60)
GFR, EST NON AFRICAN AMERICAN: 40 mL/min — AB (ref >60)
GLUCOSE: 117 mg/dL — AB (ref 65–99)
POTASSIUM: 4.8 mmol/L (ref 3.5–5.1)
Sodium: 136 mmol/L (ref 135–145)
Total Protein: 7.4 g/dL (ref 6.5–8.1)

## 2015-02-05 LAB — STREP PNEUMONIAE URINARY ANTIGEN: Strep Pneumo Urinary Antigen: NEGATIVE

## 2015-02-05 LAB — URINE MICROSCOPIC-ADD ON

## 2015-02-05 LAB — CBC
HEMATOCRIT: 36.2 % (ref 36.0–46.0)
Hemoglobin: 11.6 g/dL — ABNORMAL LOW (ref 12.0–15.0)
MCH: 27.1 pg (ref 26.0–34.0)
MCHC: 32 g/dL (ref 30.0–36.0)
MCV: 84.6 fL (ref 78.0–100.0)
Platelets: 285 10*3/uL (ref 150–400)
RBC: 4.28 MIL/uL (ref 3.87–5.11)
RDW: 14.9 % (ref 11.5–15.5)
WBC: 12.4 10*3/uL — AB (ref 4.0–10.5)

## 2015-02-05 LAB — LIPASE, BLOOD: Lipase: 23 U/L (ref 11–51)

## 2015-02-05 MED ORDER — LEVOFLOXACIN 500 MG PO TABS
500.0000 mg | ORAL_TABLET | Freq: Once | ORAL | Status: AC
  Administered 2015-02-05: 500 mg via ORAL
  Filled 2015-02-05: qty 1

## 2015-02-05 MED ORDER — ONDANSETRON HCL 4 MG/2ML IJ SOLN
4.0000 mg | Freq: Four times a day (QID) | INTRAMUSCULAR | Status: DC | PRN
  Administered 2015-02-06 – 2015-02-09 (×2): 4 mg via INTRAVENOUS
  Filled 2015-02-05 (×2): qty 2

## 2015-02-05 MED ORDER — ONDANSETRON HCL 4 MG PO TABS: 4.0000 mg | ORAL_TABLET | Freq: Four times a day (QID) | ORAL | Status: DC | PRN

## 2015-02-05 MED ORDER — OXYCODONE-ACETAMINOPHEN 5-325 MG PO TABS: 1.0000 | ORAL_TABLET | ORAL | Status: DC | PRN

## 2015-02-05 MED ORDER — ONDANSETRON HCL 4 MG/2ML IJ SOLN
4.0000 mg | Freq: Four times a day (QID) | INTRAMUSCULAR | Status: DC | PRN
  Administered 2015-02-05: 4 mg via INTRAVENOUS
  Filled 2015-02-05: qty 2

## 2015-02-05 MED ORDER — METRONIDAZOLE IN NACL 5-0.79 MG/ML-% IV SOLN
500.0000 mg | Freq: Three times a day (TID) | INTRAVENOUS | Status: DC
  Administered 2015-02-05 – 2015-02-10 (×14): 500 mg via INTRAVENOUS
  Filled 2015-02-05 (×15): qty 100

## 2015-02-05 MED ORDER — ACETAMINOPHEN 650 MG RE SUPP: 650.0000 mg | Freq: Four times a day (QID) | RECTAL | Status: DC | PRN

## 2015-02-05 MED ORDER — ESCITALOPRAM OXALATE 20 MG PO TABS
20.0000 mg | ORAL_TABLET | Freq: Every day | ORAL | Status: DC
  Administered 2015-02-06 – 2015-02-10 (×5): 20 mg via ORAL
  Filled 2015-02-05 (×5): qty 1

## 2015-02-05 MED ORDER — LEVOFLOXACIN IN D5W 750 MG/150ML IV SOLN
750.0000 mg | INTRAVENOUS | Status: DC
  Administered 2015-02-07 – 2015-02-09 (×2): 750 mg via INTRAVENOUS
  Filled 2015-02-05 (×2): qty 150

## 2015-02-05 MED ORDER — LEVOFLOXACIN 500 MG PO TABS: 500.0000 mg | ORAL_TABLET | Freq: Every day | ORAL | Status: DC

## 2015-02-05 MED ORDER — MORPHINE SULFATE (PF) 4 MG/ML IV SOLN
4.0000 mg | INTRAVENOUS | Status: DC | PRN
  Administered 2015-02-05: 4 mg via INTRAVENOUS
  Filled 2015-02-05: qty 1

## 2015-02-05 MED ORDER — MONTELUKAST SODIUM 10 MG PO TABS
10.0000 mg | ORAL_TABLET | Freq: Every day | ORAL | Status: DC
  Administered 2015-02-06 – 2015-02-10 (×5): 10 mg via ORAL
  Filled 2015-02-05 (×5): qty 1

## 2015-02-05 MED ORDER — DARIFENACIN HYDROBROMIDE ER 15 MG PO TB24
15.0000 mg | ORAL_TABLET | Freq: Every day | ORAL | Status: DC
  Administered 2015-02-06 – 2015-02-10 (×5): 15 mg via ORAL
  Filled 2015-02-05 (×5): qty 1

## 2015-02-05 MED ORDER — HYDROMORPHONE HCL 1 MG/ML IJ SOLN
1.0000 mg | INTRAMUSCULAR | Status: DC | PRN
  Administered 2015-02-08 (×4): 1 mg via INTRAVENOUS
  Filled 2015-02-05 (×4): qty 1

## 2015-02-05 MED ORDER — ACETAMINOPHEN 325 MG PO TABS
650.0000 mg | ORAL_TABLET | Freq: Four times a day (QID) | ORAL | Status: DC | PRN
  Administered 2015-02-07 – 2015-02-09 (×2): 650 mg via ORAL
  Filled 2015-02-05 (×2): qty 2

## 2015-02-05 MED ORDER — IOHEXOL 300 MG/ML  SOLN
100.0000 mL | Freq: Once | INTRAMUSCULAR | Status: AC | PRN
  Administered 2015-02-05: 100 mL via INTRAVENOUS

## 2015-02-05 MED ORDER — OXYCODONE-ACETAMINOPHEN 5-325 MG PO TABS
1.0000 | ORAL_TABLET | Freq: Once | ORAL | Status: AC
  Administered 2015-02-05: 1 via ORAL
  Filled 2015-02-05: qty 1

## 2015-02-05 MED ORDER — HYDRALAZINE HCL 25 MG PO TABS
25.0000 mg | ORAL_TABLET | Freq: Two times a day (BID) | ORAL | Status: DC
  Administered 2015-02-05 – 2015-02-10 (×10): 25 mg via ORAL
  Filled 2015-02-05 (×10): qty 1

## 2015-02-05 MED ORDER — ENOXAPARIN SODIUM 40 MG/0.4ML ~~LOC~~ SOLN
40.0000 mg | SUBCUTANEOUS | Status: DC
  Administered 2015-02-05 – 2015-02-06 (×2): 40 mg via SUBCUTANEOUS
  Filled 2015-02-05 (×2): qty 0.4

## 2015-02-05 MED ORDER — ATORVASTATIN CALCIUM 40 MG PO TABS
40.0000 mg | ORAL_TABLET | Freq: Every day | ORAL | Status: DC
  Administered 2015-02-06 – 2015-02-10 (×5): 40 mg via ORAL
  Filled 2015-02-05 (×5): qty 1

## 2015-02-05 MED ORDER — IBUPROFEN 600 MG PO TABS: 600.0000 mg | ORAL_TABLET | Freq: Three times a day (TID) | ORAL | Status: DC | PRN

## 2015-02-05 MED ORDER — SODIUM CHLORIDE 0.9 % IV SOLN
INTRAVENOUS | Status: DC
  Administered 2015-02-05 – 2015-02-08 (×7): via INTRAVENOUS

## 2015-02-05 MED ORDER — IOHEXOL 300 MG/ML  SOLN
25.0000 mL | Freq: Once | INTRAMUSCULAR | Status: DC | PRN
  Administered 2015-02-05: 25 mL via ORAL
  Filled 2015-02-05: qty 30

## 2015-02-05 NOTE — ED Provider Notes (Signed)
CSN: [id number]     Arrival date & time 02/05/15  1239 History   First MD Initiated Contact with Patient 02/05/15 1305     Chief Complaint  Patient presents with  . Abdominal Pain  . Nausea  . Diarrhea  . Vaginal Bleeding      HPI Patient presents emergency department with worsening abdominal pain and back pain over the past 2 weeks.  She's had associated nausea and vomiting as well.  She noted small blood clots in her urine and went to his her urologist that she has a history of kidney stones was told that she had a kidney stone but that didn't need operative evaluation or treatment at this time.  She reports ongoing symptoms of abdominal pain nausea vomiting.  She reports some increasing cough with shortness of breath as well.  She denies upper back pain.  No prior history of abdominal surgery.  She does continue to smoke cigarettes.  No recent weight changes.  Her pain is moderate in severity.   Past Medical History  Diagnosis Date  . Hypertension   . Depression   . Allergic rhinitis   . Rash   . Chronic ethmoidal sinusitis   . Chronic maxillary sinusitis   . Chronic airway obstruction, not elsewhere classified   . Irritable bowel syndrome   . Unspecified protein-calorie malnutrition ([place])   . Pure hypercholesterolemia   . Sensorineural hearing loss, unspecified   . Sensorineural hearing loss, unilateral   . Subjective tinnitus   . Tobacco use disorder   . Loss of weight   . Dizziness    Past Surgical History  Procedure Laterality Date  . Nasal sinus surgery    . Appendectomy    . Rhinologic surgery    . Tubal ligation    . Cataract extraction Left   . Cystoscopy     Family History  Problem Relation Age of Onset  . Congestive Heart Failure Mother   . Hearing loss Mother   . Hypertension Father   . Stroke Mother     syndrome  . Other      thyroid disorder   Social History  Substance Use Topics  . Smoking status: Current Every Day Smoker -- 1.00 packs/day for  25 years    Types: Cigarettes  . Smokeless tobacco: Never Used  . Alcohol Use: 0.6 oz/week    1 Glasses of wine per week     Comment: One drink a week.   OB History    No data available     Review of Systems  All other systems reviewed and are negative.     Allergies  Penicillins; Soy allergy; and Sulfonamide derivatives  Home Medications   Prior to Admission medications   Medication Sig Start Date End Date Taking? Authorizing Provider  acetaminophen (TYLENOL) 500 MG tablet Take 1,000 mg by mouth every 6 (six) hours as needed for moderate pain or headache.   Yes Historical Provider, MD  aspirin EC 81 MG tablet Take 81 mg by mouth daily.   Yes Historical Provider, MD  atorvastatin (LIPITOR) 40 MG tablet Take 40 mg by mouth daily.   Yes Historical Provider, MD  BESIVANCE 0.6 % SUSP Place 1 drop into the right eye 4 (four) times daily. The day before and the day of the dr visit 11/07/14  Yes Historical Provider, MD  Cholecalciferol (VITAMIN D) 2000 UNITS CAPS Take 2,000 Units by mouth daily.   Yes Historical Provider, MD  DONNATAL 16.2 MG tablet  16.2 tablets daily. 04/18/13  Yes Historical Provider, MD  escitalopram (LEXAPRO) 20 MG tablet  01/20/13  Yes Historical Provider, MD  fenofibrate micronized (ANTARA) 130 MG capsule Take 130 mg by mouth daily. 12/17/14  Yes Historical Provider, MD  fluticasone (FLONASE) 50 MCG/ACT nasal spray Place 2 sprays into the nose daily.   Yes Historical Provider, MD  hydrALAZINE (APRESOLINE) 25 MG tablet Take 25 mg by mouth 2 (two) times daily. 01/31/15  Yes Historical Provider, MD  hydrochlorothiazide (HYDRODIURIL) 25 MG tablet Take 25 mg by mouth daily.   Yes Historical Provider, MD  loperamide (IMODIUM) 2 MG capsule Take 2-4 mg by mouth 4 (four) times daily as needed for diarrhea or loose stools. For upset stomach   Yes Historical Provider, MD  losartan (COZAAR) 50 MG tablet Take 50 mg by mouth 2 (two) times daily.  03/23/13  Yes Historical Provider,  MD  meclizine (ANTIVERT) 25 MG tablet Take 25 mg by mouth daily as needed for dizziness.  03/23/13  Yes Historical Provider, MD  mometasone (ASMANEX) 220 MCG/INH inhaler Inhale 2 puffs into the lungs 2 (two) times daily.    Yes Historical Provider, MD  montelukast (SINGULAIR) 10 MG tablet Take 10 mg by mouth daily.    Yes Historical Provider, MD  Multiple Vitamin (MULTIVITAMIN WITH MINERALS) TABS Take 1 tablet by mouth daily.   Yes Historical Provider, MD  Multiple Vitamins-Minerals (ICAPS AREDS 2 PO) Take 1 tablet by mouth 2 (two) times daily.   Yes Historical Provider, MD  naproxen sodium (ANAPROX) 220 MG tablet Take 220 mg by mouth daily. For pain   Yes Historical Provider, MD  ranitidine (ZANTAC) 300 MG tablet Take 300 mg by mouth 2 (two) times daily.   Yes Historical Provider, MD  solifenacin (VESICARE) 10 MG tablet Take 10 mg by mouth daily.    Yes Historical Provider, MD  ibuprofen (ADVIL,MOTRIN) 600 MG tablet Take 1 tablet (600 mg total) by mouth every 8 (eight) hours as needed. 02/05/15   Jola Schmidt, MD  levofloxacin (LEVAQUIN) 500 MG tablet Take 1 tablet (500 mg total) by mouth daily. 02/05/15   Jola Schmidt, MD  oxyCODONE-acetaminophen (PERCOCET/ROXICET) 5-325 MG tablet Take 1 tablet by mouth every 4 (four) hours as needed for severe pain. 02/05/15   Jola Schmidt, MD   BP 134/58 mmHg  Pulse 68  Temp(Src) 97.8 F (36.6 C) (Oral)  Resp 18  SpO2 96% Physical Exam  Constitutional: She is oriented to person, place, and time. She appears well-developed and well-nourished. No distress.  HENT:  Head: Normocephalic and atraumatic.  Eyes: EOM are normal.  Neck: Normal range of motion.  Cardiovascular: Normal rate, regular rhythm and normal heart sounds.   Pulmonary/Chest: Effort normal and breath sounds normal.  Abdominal: Soft. She exhibits no distension. There is no tenderness.  Musculoskeletal: Normal range of motion.  Neurological: She is alert and oriented to person, place, and  time.  Skin: Skin is warm and dry.  Psychiatric: She has a normal mood and affect. Judgment normal.  Nursing note and vitals reviewed.   ED Course  Procedures (including critical care time) Labs Review Labs Reviewed  COMPREHENSIVE METABOLIC PANEL - Abnormal; Notable for the following:    Chloride 99 (*)    Glucose, Bld 117 (*)    Creatinine, Ser 1.30 (*)    AST 66 (*)    ALT 55 (*)    Alkaline Phosphatase 139 (*)    Total Bilirubin 1.3 (*)    GFR calc  non Af Amer 40 (*)    GFR calc Af Amer 46 (*)    All other components within normal limits  CBC - Abnormal; Notable for the following:    WBC 12.4 (*)    Hemoglobin 11.6 (*)    All other components within normal limits  URINALYSIS, ROUTINE W REFLEX MICROSCOPIC (NOT AT [facility]) - Abnormal; Notable for the following:    Color, Urine AMBER (*)    APPearance TURBID (*)    Specific Gravity, Urine 1.038 (*)    Hgb urine dipstick LARGE (*)    Protein, ur 100 (*)    Leukocytes, UA SMALL (*)    All other components within normal limits  URINE CULTURE  LIPASE, BLOOD  URINE MICROSCOPIC-ADD ON    Imaging Review Dg Chest 2 View  02/05/2015  CLINICAL DATA:  Productive cough for 1 week EXAM: CHEST  2 VIEW COMPARISON:  Chest CT July 25, 2006 FINDINGS: There is focal airspace consolidation in the right middle lobe. The lungs elsewhere are clear. Heart size and pulmonary vascularity are normal. No adenopathy. There is mild levoscoliosis in the mid thoracic region. IMPRESSION: Airspace consolidation in the right middle lobe. Lungs elsewhere clear. Followup PA and lateral chest radiographs recommended in 3-4 weeks following trial of antibiotic therapy to ensure resolution and exclude underlying malignancy. Electronically Signed   By: Lowella Grip III M.D.   On: 02/05/2015 14:53   Ct Chest W Contrast  02/05/2015  CLINICAL DATA:  Recently had cystoscopy, found a kidney stone/blood in bladder, recommended to see a PT (pain) and GYN (vaginal  bleeding). Has been feeling bad x 1 week. Came here today for worsening pain in abdomen into back and pain in her neck. EXAM: CT CHEST, ABDOMEN, AND PELVIS WITH CONTRAST TECHNIQUE: Multidetector CT imaging of the chest, abdomen and pelvis was performed following the standard protocol during bolus administration of intravenous contrast. CONTRAST:  51mL OMNIPAQUE IOHEXOL 300 MG/ML SOLN, 180mL OMNIPAQUE IOHEXOL 300 MG/ML SOLN COMPARISON:  Chest x-ray 02/05/2015, chest CT 07/25/2006 FINDINGS: CT CHEST FINDINGS Mediastinum/Nodes: And 1.9 cm low-attenuation lesion is identified within the right lobe of the thyroid. This appears stable. Small likely reactive mediastinal lymph nodes present. The esophagus is normal in appearance. Coronary artery calcifications are present. No pericardial effusions. Heart size is normal. There is moderate atherosclerotic calcification of the thoracic aorta. No associated aneurysm. Lungs/Pleura: Significant emphysematous changes are identified, similar in appearance to prior study. Numerous subpleural nodules also stable there has been development of right middle and lower lobe infiltrate. Trace right pleural effusion is present. Musculoskeletal: No suspicious lytic or blastic lesions are identified. CT ABDOMEN PELVIS FINDINGS Hepatobiliary: No focal abnormality identified within the liver. There is mild intrahepatic biliary duct dilatation. Gallbladder is mildly distended. No radiopaque calculi are identified within the gallbladder. Pancreas: No focal abnormality identified within the pancreas. There is mild dilatation of the pancreatic duct. Spleen: Spleen is mildly prominent in size no focal lesions. Adrenals/Urinary Tract: Left adrenal gland is somewhat prominent in size without discrete lesion. Right adrenal gland is normal in appearance. There is mild right-sided hydroureter and enhancement of the right ureteral wall secondary to a stone at the right ureterovesical junction measuring 3  mm. Bilateral parapelvic and renal parenchymal cysts are present. Stomach/Bowel: The stomach and small bowel loops are normal in appearance. The appendix is surgically absent. There is enhancement and mild thickening of the ascending colonic wall, extending to fall the transverse colon. The descending colon has a normal  appearance. Vascular/Lymphatic: There is atherosclerotic calcification of the abdominal aorta. No aneurysm. No retroperitoneal or mesenteric adenopathy. Reproductive: The uterus is present and appears anteflexed. No adnexal mass identified. Other: No free pelvic fluid. Musculoskeletal: Thoracic and lumbar spondylosis. No suspicious lytic or blastic lesions are identified. IMPRESSION: 1. Obstructing right ureteral stone measuring 3 mm at the ureterovesical junction. 2. Enhancement of the right ureteral wall consistent with infection/inflammation. 3. Enhancement of the mucosa of the ascending colonic wall, this issue with mild wall thickening. Findings are consistent with colitis. 4. Distended gallbladder and mildly distended intrahepatic biliary ducts. This appears to be a new finding. Further evaluation with abdominal ultrasound may be helpful. 5. Coronary artery disease. 6. Atherosclerosis of the thoracic and abdominal aorta. 7. Mild splenomegaly. Electronically Signed   By: Nolon Nations M.D.   On: 02/05/2015 17:03   Ct Abdomen Pelvis W Contrast  02/05/2015  CLINICAL DATA:  Recently had cystoscopy, found a kidney stone/blood in bladder, recommended to see a PT (pain) and GYN (vaginal bleeding). Has been feeling bad x 1 week. Came here today for worsening pain in abdomen into back and pain in her neck. EXAM: CT CHEST, ABDOMEN, AND PELVIS WITH CONTRAST TECHNIQUE: Multidetector CT imaging of the chest, abdomen and pelvis was performed following the standard protocol during bolus administration of intravenous contrast. CONTRAST:  20mL OMNIPAQUE IOHEXOL 300 MG/ML SOLN, 165mL OMNIPAQUE IOHEXOL 300  MG/ML SOLN COMPARISON:  Chest x-ray 02/05/2015, chest CT 07/25/2006 FINDINGS: CT CHEST FINDINGS Mediastinum/Nodes: And 1.9 cm low-attenuation lesion is identified within the right lobe of the thyroid. This appears stable. Small likely reactive mediastinal lymph nodes present. The esophagus is normal in appearance. Coronary artery calcifications are present. No pericardial effusions. Heart size is normal. There is moderate atherosclerotic calcification of the thoracic aorta. No associated aneurysm. Lungs/Pleura: Significant emphysematous changes are identified, similar in appearance to prior study. Numerous subpleural nodules also stable there has been development of right middle and lower lobe infiltrate. Trace right pleural effusion is present. Musculoskeletal: No suspicious lytic or blastic lesions are identified. CT ABDOMEN PELVIS FINDINGS Hepatobiliary: No focal abnormality identified within the liver. There is mild intrahepatic biliary duct dilatation. Gallbladder is mildly distended. No radiopaque calculi are identified within the gallbladder. Pancreas: No focal abnormality identified within the pancreas. There is mild dilatation of the pancreatic duct. Spleen: Spleen is mildly prominent in size no focal lesions. Adrenals/Urinary Tract: Left adrenal gland is somewhat prominent in size without discrete lesion. Right adrenal gland is normal in appearance. There is mild right-sided hydroureter and enhancement of the right ureteral wall secondary to a stone at the right ureterovesical junction measuring 3 mm. Bilateral parapelvic and renal parenchymal cysts are present. Stomach/Bowel: The stomach and small bowel loops are normal in appearance. The appendix is surgically absent. There is enhancement and mild thickening of the ascending colonic wall, extending to fall the transverse colon. The descending colon has a normal appearance. Vascular/Lymphatic: There is atherosclerotic calcification of the abdominal aorta.  No aneurysm. No retroperitoneal or mesenteric adenopathy. Reproductive: The uterus is present and appears anteflexed. No adnexal mass identified. Other: No free pelvic fluid. Musculoskeletal: Thoracic and lumbar spondylosis. No suspicious lytic or blastic lesions are identified. IMPRESSION: 1. Obstructing right ureteral stone measuring 3 mm at the ureterovesical junction. 2. Enhancement of the right ureteral wall consistent with infection/inflammation. 3. Enhancement of the mucosa of the ascending colonic wall, this issue with mild wall thickening. Findings are consistent with colitis. 4. Distended gallbladder and mildly distended intrahepatic  biliary ducts. This appears to be a new finding. Further evaluation with abdominal ultrasound may be helpful. 5. Coronary artery disease. 6. Atherosclerosis of the thoracic and abdominal aorta. 7. Mild splenomegaly. Electronically Signed   By: Nolon Nations M.D.   On: 02/05/2015 17:03   I have personally reviewed and evaluated these images and lab results as part of my medical decision-making.   EKG Interpretation None      MDM   Final diagnoses:  CAP (community acquired pneumonia)  Back pain, unspecified location  Abdominal pain, unspecified abdominal location  Hematuria    Patient with right middle lobe and right lower lobe pneumonia.  This likely community acquired pneumonia.  She'll be started on antibiotics for this.  She does have some ongoing abdominal pain and back pain.  It does not seem to be focal to the right side.  She does have a small ascending colitis on CT scans as well as the distal right ureteral obstructing stone.  She does have small amount of blood in her urine.  Urine sent for culture.  No bacteriuria noted.  Interestingly she does have new elevation in her AST, ALT, alkaline phosphatase, T bili.  She does have dilated intra-and extrahepatic biliary ducts with some distention of her gallbladder.  Patient may benefit from GI  consultation and possible MRCP.  Hospitalist will be consult for admission given the complexity of the patient's complaints and multiple etiologies and possibilities that remain.  She continues to be symptomatic.  I think she'll benefit from additional testing including abdominal ultrasound and likely MRCP.  Her pain will need to be treated aggressively.  Urine culture pending.  Antibiotics for pneumonia.    Jola Schmidt, MD 02/05/15 [phone]

## 2015-02-05 NOTE — ED Notes (Signed)
MD at bedside. 

## 2015-02-05 NOTE — H&P (Signed)
PCP:   Stephens Shire, MD   Chief Complaint:  Abdominal pain  HPI:  74 year old female who  has a past medical history of Hypertension; Depression; Allergic rhinitis; Rash; Chronic ethmoidal sinusitis; Chronic maxillary sinusitis; Chronic airway obstruction, not elsewhere classified; Irritable bowel syndrome; Unspecified protein-calorie malnutrition ([place]); Pure hypercholesterolemia; Sensorineural hearing loss, unspecified; Sensorineural hearing loss, unilateral; Subjective tinnitus; Tobacco use disorder; Loss of weight; and Dizziness. Today presents to the hospital with worsening abdominal pain and back pain over the past 2 weeks. Patient was recently seen by urology for hematuria. She underwent cystoscopy at that time they noticed that patient had some blood in the urinary bladder she also had imaging studies which showed 3 mm kidney stone in the right kidney which did not require any intervention. There was a concern that patient may have vaginal bleeding so urologist recommended that patient be seen by a gynecologist as outpatient. In the meantime patient continued to have abdominal pain and then developed coughing with brown colored phlegm. She did have nausea but no vomiting. Denies diarrhea. Patient had low-grade fever. And continue to have hematuria. Today patient also complained of radiation of pain to the region below the right side of neck. 71 In the ED chest x-ray showed right middle and lower lobe infiltrate, CT scan of the chest and abdomen pelvis was performed which showed distended gallbladder with mildly dilated intrahepatic ducts, enhancement of right degenerative CYST with infection/inflammation, obstructing right ureteral stone measuring 3 mm at ureterovesical junction, enhancement of mucosa of the ascending colonic wall consistent with colitis.  Patient denies chest pain, no shortness of breath.  Allergies:   Allergies  Allergen Reactions  . Penicillins Anaphylaxis    Has  patient had a PCN reaction causing immediate rash, facial/tongue/throat swelling, SOB or lightheadedness with hypotension: Yes Has patient had a PCN reaction causing severe rash involving mucus membranes or skin necrosis: Yes Has patient had a PCN reaction that required hospitalization Yes Has patient had a PCN reaction occurring within the last 10 years: No If all of the above answers are "NO", then may proceed with Cephalosporin use.   . Soy Allergy Itching and Rash  . Sulfonamide Derivatives Rash      Past Medical History  Diagnosis Date  . Hypertension   . Depression   . Allergic rhinitis   . Rash   . Chronic ethmoidal sinusitis   . Chronic maxillary sinusitis   . Chronic airway obstruction, not elsewhere classified   . Irritable bowel syndrome   . Unspecified protein-calorie malnutrition ([place])   . Pure hypercholesterolemia   . Sensorineural hearing loss, unspecified   . Sensorineural hearing loss, unilateral   . Subjective tinnitus   . Tobacco use disorder   . Loss of weight   . Dizziness     Past Surgical History  Procedure Laterality Date  . Nasal sinus surgery    . Appendectomy    . Rhinologic surgery    . Tubal ligation    . Cataract extraction Left   . Cystoscopy      Prior to Admission medications   Medication Sig Start Date End Date Taking? Authorizing Provider  acetaminophen (TYLENOL) 500 MG tablet Take 1,000 mg by mouth every 6 (six) hours as needed for moderate pain or headache.   Yes Historical Provider, MD  aspirin EC 81 MG tablet Take 81 mg by mouth daily.   Yes Historical Provider, MD  atorvastatin (LIPITOR) 40 MG tablet Take 40 mg by mouth daily.  Yes Historical Provider, MD  BESIVANCE 0.6 % SUSP Place 1 drop into the right eye 4 (four) times daily. The day before and the day of the dr visit 11/07/14  Yes Historical Provider, MD  Cholecalciferol (VITAMIN D) 2000 UNITS CAPS Take 2,000 Units by mouth daily.   Yes Historical Provider, MD  DONNATAL 16.2  MG tablet 16.2 tablets daily. 04/18/13  Yes Historical Provider, MD  escitalopram (LEXAPRO) 20 MG tablet  01/20/13  Yes Historical Provider, MD  fenofibrate micronized (ANTARA) 130 MG capsule Take 130 mg by mouth daily. 12/17/14  Yes Historical Provider, MD  fluticasone (FLONASE) 50 MCG/ACT nasal spray Place 2 sprays into the nose daily.   Yes Historical Provider, MD  hydrALAZINE (APRESOLINE) 25 MG tablet Take 25 mg by mouth 2 (two) times daily. 01/31/15  Yes Historical Provider, MD  hydrochlorothiazide (HYDRODIURIL) 25 MG tablet Take 25 mg by mouth daily.   Yes Historical Provider, MD  loperamide (IMODIUM) 2 MG capsule Take 2-4 mg by mouth 4 (four) times daily as needed for diarrhea or loose stools. For upset stomach   Yes Historical Provider, MD  losartan (COZAAR) 50 MG tablet Take 50 mg by mouth 2 (two) times daily.  03/23/13  Yes Historical Provider, MD  meclizine (ANTIVERT) 25 MG tablet Take 25 mg by mouth daily as needed for dizziness.  03/23/13  Yes Historical Provider, MD  mometasone (ASMANEX) 220 MCG/INH inhaler Inhale 2 puffs into the lungs 2 (two) times daily.    Yes Historical Provider, MD  montelukast (SINGULAIR) 10 MG tablet Take 10 mg by mouth daily.    Yes Historical Provider, MD  Multiple Vitamin (MULTIVITAMIN WITH MINERALS) TABS Take 1 tablet by mouth daily.   Yes Historical Provider, MD  Multiple Vitamins-Minerals (ICAPS AREDS 2 PO) Take 1 tablet by mouth 2 (two) times daily.   Yes Historical Provider, MD  naproxen sodium (ANAPROX) 220 MG tablet Take 220 mg by mouth daily. For pain   Yes Historical Provider, MD  ranitidine (ZANTAC) 300 MG tablet Take 300 mg by mouth 2 (two) times daily.   Yes Historical Provider, MD  solifenacin (VESICARE) 10 MG tablet Take 10 mg by mouth daily.    Yes Historical Provider, MD  ibuprofen (ADVIL,MOTRIN) 600 MG tablet Take 1 tablet (600 mg total) by mouth every 8 (eight) hours as needed. 02/05/15   Jola Schmidt, MD  levofloxacin (LEVAQUIN) 500 MG tablet  Take 1 tablet (500 mg total) by mouth daily. 02/05/15   Jola Schmidt, MD  oxyCODONE-acetaminophen (PERCOCET/ROXICET) 5-325 MG tablet Take 1 tablet by mouth every 4 (four) hours as needed for severe pain. 02/05/15   Jola Schmidt, MD    Social History:  reports that she has been smoking Cigarettes.  She has a 25 pack-year smoking history. She has never used smokeless tobacco. She reports that she drinks about 0.6 oz of alcohol per week. She reports that she does not use illicit drugs.  Family History  Problem Relation Age of Onset  . Congestive Heart Failure Mother   . Hearing loss Mother   . Hypertension Father   . Stroke Mother     syndrome  . Other      thyroid disorder     All the positives are listed in BOLD  Review of Systems:  HEENT: Headache, blurred vision, runny nose, sore throat Neck: Hypothyroidism, hyperthyroidism,,lymphadenopathy Chest : Shortness of breath, history of COPD, Asthma, cough Heart : Chest pain, history of coronary arterey disease GI:  Nausea, vomiting, diarrhea,  constipation, GERD GU: Dysuria, urgency, frequency of urination, hematuria Neuro: Stroke, seizures, syncope Psych: Depression, anxiety, hallucinations no eye morning but as well and she is she is    Physical Exam: Blood pressure 128/59, pulse 67, temperature 97.8 F (36.6 C), temperature source Oral, resp. rate 18, SpO2 97 %. Constitutional:   Patient is a well-developed and well-nourished female in no acute distress and cooperative with exam. Head: Normocephalic and atraumatic Mouth: Mucus membranes moist Eyes: PERRL, EOMI, conjunctivae normal Neck: Supple, No Thyromegaly Cardiovascular: RRR, S1 normal, S2 normal Pulmonary/Chest: CTAB, no wheezes, rales, or rhonchi Abdominal: Soft.  positive right upper quadrant tenderness to palpation, mild guarding noted , non-distended, bowel sounds are normal, no masses, organomegaly Neurological: A&O x3, Strength is normal and symmetric bilaterally,  cranial nerve II-XII are grossly intact, no focal motor deficit, sensory intact to light touch bilaterally.  Extremities : No Cyanosis, Clubbing or Edema  Labs on Admission:  Basic Metabolic Panel:  Recent Labs Lab 02/05/15 1324  NA 136  K 4.8  CL 99*  CO2 28  GLUCOSE 117*  BUN 18  CREATININE 1.30*  CALCIUM 10.1   Liver Function Tests:  Recent Labs Lab 02/05/15 1324  AST 66*  ALT 55*  ALKPHOS 139*  BILITOT 1.3*  PROT 7.4  ALBUMIN 3.8    Recent Labs Lab 02/05/15 1324  LIPASE 23   No results for input(s): AMMONIA in the last 168 hours. CBC:  Recent Labs Lab 02/05/15 1324  WBC 12.4*  HGB 11.6*  HCT 36.2  MCV 84.6  PLT 285    Radiological Exams on Admission: Dg Chest 2 View  02/05/2015  CLINICAL DATA:  Productive cough for 1 week EXAM: CHEST  2 VIEW COMPARISON:  Chest CT July 25, 2006 FINDINGS: There is focal airspace consolidation in the right middle lobe. The lungs elsewhere are clear. Heart size and pulmonary vascularity are normal. No adenopathy. There is mild levoscoliosis in the mid thoracic region. I MPRESSION: Airspace consolidation in the right middle lobe. Lungs elsewhere clear. Followup PA and lateral chest radiographs recommended in 3-4 weeks following trial of antibiotic therapy to ensure resolution and exclude underlying malignancy. Electronically Signed   By: Lowella Grip III M.D.   On: 02/05/2015 14:53   Ct Chest W Contras    CT CHEST, ABDOMEN, AND PELVIS WITH CONTRAST TECHNIQUE: Multidetector CT imaging of the chest, abdomen and pelvis was performed following the standard protocol during bolus administration of intravenous contrast. CONTRAST:  58mL OMNIPAQUE IOHEXOL 300 MG/ML SOLN, 123mL OMNIPAQUE IOHEXOL 300 MG/ML SOLN COMPARISON:  Chest x-ray 02/05/2015, chest CT 07/25/2006 FINDINGS: CT CHEST FINDINGS Mediastinum/Nodes: And 1.9 cm low-attenuation lesion is identified within the right lobe of the thyroid. This appears stable. Small likely  reactive mediastinal lymph nodes present. The esophagus is normal in appearance. Coronary artery calcifications are present. No pericardial effusions. Heart size is normal. There is moderate atherosclerotic calcification of the thoracic aorta. No associated aneurysm. Lungs/Pleura: Significant emphysematous changes are identified, similar in appearance to prior study. Numerous subpleural nodules also stable there has been development of right middle and lower lobe infiltrate. Trace right pleural effusion is present. Musculoskeletal: No suspicious lytic or blastic lesions are identified.    Ct Abdomen Pelvis W Contrast  02/05/2015  ADDENDUM REPORT: 02/05/2015 17:41 ADDENDUM: Aberrant right subclavian artery incidentally noted. Electronically Signed   By: Nolon Nations M.D.   On: 02/05/2015 17:41  02/05/2015  CLINICAL DATA:  Recently had cystoscopy, found a kidney stone/blood in bladder, recommended  to see a PT (pain) and GYN (vaginal bleeding). Has been feeling bad x 1 week. Came here today for worsening pain in abdomen into back and pain in her neck. EXAM: CT CHEST, ABDOMEN, AND PELVIS WITH CONTRAST TECHNIQUE: Multidetector CT imaging of the chest, abdomen and pelvis was performed following the standard protocol during bolus administration of intravenous contrast. CONTRAST:  92mL OMNIPAQUE IOHEXOL 300 MG/ML SOLN, 18mL OMNIPAQUE IOHEXOL 300 MG/ML SOLN COMPARISON:  Chest x-ray 02/05/2015, chest CT 07/25/2006 FINDINGS: CT CHEST FINDINGS Mediastinum/Nodes: And 1.9 cm low-attenuation lesion is identified within the right lobe of the thyroid. This appears stable. Small likely reactive mediastinal lymph nodes present. The esophagus is normal in appearance. Coronary artery calcifications are present. No pericardial effusions. Heart size is normal. There is moderate atherosclerotic calcification of the thoracic aorta. No associated aneurysm. Lungs/Pleura: Significant emphysematous changes are identified, similar in  appearance to prior study. Numerous subpleural nodules also stable there has been development of right middle and lower lobe infiltrate. Trace right pleural effusion is present. Musculoskeletal: No suspicious lytic or blastic lesions are identified.   CT ABDOMEN PELVIS FINDINGS Hepatobiliary: IMPRESSION: 1. Obstructing right ureteral stone measuring 3 mm at the ureterovesical junction. 2. Enhancement of the right ureteral wall consistent with infection/inflammation. 3. Enhancement of the mucosa of the ascending colonic wall, this issue with mild wall thickening. Findings are consistent with colitis. 4. Distended gallbladder and mildly distended intrahepatic biliary ducts. This appears to be a new finding. Further evaluation with abdominal ultrasound may be helpful. 5. Coronary artery disease. 6. Atherosclerosis of the thoracic and abdominal aorta. 7. Mild splenomegaly. Electronically Signed: By: Nolon Nations M.D. On: 02/05/2015 17:03       Assessment/Plan Active Problems:   Abdominal pain   Pneumonia   CAP (community acquired pneumonia)   Kidney stone on right side   Colitis  Community acquired pneumonia   Patient chest x-ray shows right middle and lower lobe infiltrate, will start the patient on Zosyn per pharmacy consultation. Obtain urine for Legionella as well as strep pneumo antigen.  Abdominal pain multiple causes for abdominal pain, patient has right upper quadrant tenderness with distended gallbladder, right she returned inflammation, 3 mm stone at the ureterovesical junction, colitis.  Will obtain abdominal ultrasound, if abnormal consider surgical evaluation. Continue Zosyn as per pharmacy consultation Keep nothing by mouth, Dilaudid when necessary for pain  Colitis   CT scan shows colitis on right  continue Zosyn  ? UTI Patient has significant hematuria, urine culture has been obtained. Will continue with antibiotics and follow urine culture results.  ? Vaginal  bleeding Patient has an appointment to see gynecologist as outpatient. Will check pelvic ultrasound  DVT prophylaxis   Lovenox  Code status: DO NOT RESUSCITATE  Family discussion: Admission, patients condition and plan of care including tests being ordered have been discussed with the patient and her husband and daughter at bedside who indicate understanding and agree with the plan and Code Status.   Time Spent on Admission: 60 min  [place] [facility] Pager: [phone] 02/05/2015, 6:20 PM  If 7PM-7AM, please contact night-coverage  [email or web address]  Password [email or web address]

## 2015-02-05 NOTE — ED Notes (Signed)
Pt can go at 18:45

## 2015-02-05 NOTE — ED Notes (Signed)
Recently had cystoscopy, found a kidney stone/blood in bladder, reccommeded to see a PT (pain) and GYN (vaginal bleeding).  Has been feeling bad x 1 week. Came here today for worsening pain in abdomin into back and pain in her neck. Nauseous, diarrhea, chills over the last couple of days. Took tylenol this morning around 0700.

## 2015-02-05 NOTE — ED Notes (Signed)
Nurse drawing labs. 

## 2015-02-05 NOTE — Progress Notes (Signed)
ANTIBIOTIC CONSULT NOTE - INITIAL  Pharmacy Consult for Levofloxacin Indication: CAP, IAI, possible UTI  Allergies  Allergen Reactions  . Penicillins Anaphylaxis    Has patient had a PCN reaction causing immediate rash, facial/tongue/throat swelling, SOB or lightheadedness with hypotension: Yes Has patient had a PCN reaction causing severe rash involving mucus membranes or skin necrosis: Yes Has patient had a PCN reaction that required hospitalization Yes Has patient had a PCN reaction occurring within the last 10 years: No If all of the above answers are "NO", then may proceed with Cephalosporin use.   . Soy Allergy Itching and Rash  . Sulfonamide Derivatives Rash    Patient Measurements: Height: 5\' 2"  (157.5 cm) Weight: 104 lb (47.174 kg) IBW/kg (Calculated) : 50.1  Vital Signs: Temp: 98.2 F (36.8 C) (11/03 1908) Temp Source: Oral (11/03 1908) BP: 104/56 mmHg (11/03 1908) Pulse Rate: 62 (11/03 1908) Intake/Output from previous day:   Intake/Output from this shift:    Labs:  Recent Labs  02/05/15 1324  WBC 12.4*  HGB 11.6*  PLT 285  CREATININE 1.30*   Estimated Creatinine Clearance: 28.7 mL/min (by C-G formula based on Cr of 1.3). No results for input(s): VANCOTROUGH, VANCOPEAK, VANCORANDOM, GENTTROUGH, GENTPEAK, GENTRANDOM, TOBRATROUGH, TOBRAPEAK, TOBRARND, AMIKACINPEAK, AMIKACINTROU, AMIKACIN in the last 72 hours.   Microbiology: No results found for this or any previous visit (from the past 720 hour(s)).  Medical History: Past Medical History  Diagnosis Date  . Hypertension   . Depression   . Allergic rhinitis   . Rash   . Chronic ethmoidal sinusitis   . Chronic maxillary sinusitis   . Chronic airway obstruction, not elsewhere classified   . Irritable bowel syndrome   . Unspecified protein-calorie malnutrition ([place])   . Pure hypercholesterolemia   . Sensorineural hearing loss, unspecified   . Sensorineural hearing loss, unilateral   . Subjective  tinnitus   . Tobacco use disorder   . Loss of weight   . Dizziness     Medications:  Anti-infectives    Start     Dose/Rate Route Frequency Ordered Stop   02/05/15 2000  metroNIDAZOLE (FLAGYL) IVPB 500 mg     500 mg 100 mL/hr over 60 Minutes Intravenous Every 8 hours 02/05/15 1928     02/05/15 1630  levofloxacin (LEVAQUIN) tablet 500 mg     500 mg Oral  Once 02/05/15 1626 02/05/15 1644   02/05/15 0000  levofloxacin (LEVAQUIN) 500 MG tablet     500 mg Oral Daily 02/05/15 1706       Assessment: 74 y.o. female presents with worsening abdominal and back pain x 2 weeks.  Recently seen for hematuria and noted to have nephrolithiasis.  Since then has developed productive cough and worsening abdominal pain with low-grade fevers.  Hematuria has continued.  CXR shows RML and RLL infiltrate, CT chest + pelvis which showed distended gallbladder with mildly dilated intrahepatic ducts, enhancement of right degenerative cyst with infection/inflammation, obstructing right ureteral stone measuring 3 mm at ureterovesical junction, enhancement of mucosa of the ascending colonic wall consistent with colitis.  Noted with anaphylaxis to PCN; no other administered beta-lactams in Epic.  Pharmacy to dose Levaquin with Flagyl per MD to cover CAP, intra-abdominal infection, and possible UTI  11/3 >> Levofloxacin >> 11/3 >> Metronidazole >>    11/3 urine: ordered S. pneumo UAg: IP Legionella UAg: IP  Today, 02/05/2015: Afebrile WBC mildly elevated Renal: SCr elevated at 1.3 (baseline unknown, only previous reading 1.1); CrCl 29 CG (primarily  d/t low weight   Goal of Therapy:  Eradication of infection Appropriate antibiotic dosing for indication and renal function  Plan:  Day 1 antibiotics  Levaquin 750 mg IV q48 hr  Flagyl per MD  Note that systemwide E coli resistance rates have been as high as 30% in recent years.  If patient declines; consider adding gentamicin to above regimen  Follow clinical  course, renal function, culture results as available  Follow for de-escalation of antibiotics and LOT   Reuel Boom, PharmD, BCPS Pager: [phone] 02/05/2015, 7:57 PM

## 2015-02-05 NOTE — Discharge Instructions (Signed)

## 2015-02-06 ENCOUNTER — Inpatient Hospital Stay (HOSPITAL_COMMUNITY): Payer: Medicare Other

## 2015-02-06 LAB — COMPREHENSIVE METABOLIC PANEL
ALBUMIN: 3 g/dL — AB (ref 3.5–5.0)
ALK PHOS: 112 U/L (ref 38–126)
ALT: 37 U/L (ref 14–54)
AST: 36 U/L (ref 15–41)
Anion gap: 10 (ref 5–15)
BILIRUBIN TOTAL: 0.5 mg/dL (ref 0.3–1.2)
BUN: 12 mg/dL (ref 6–20)
CALCIUM: 9.1 mg/dL (ref 8.9–10.3)
CO2: 25 mmol/L (ref 22–32)
CREATININE: 0.85 mg/dL (ref 0.44–1.00)
Chloride: 105 mmol/L (ref 101–111)
GFR calc Af Amer: >60 mL/min (ref >60)
GLUCOSE: 70 mg/dL (ref 65–99)
POTASSIUM: 4.1 mmol/L (ref 3.5–5.1)
Sodium: 140 mmol/L (ref 135–145)
TOTAL PROTEIN: 6 g/dL — AB (ref 6.5–8.1)

## 2015-02-06 LAB — CBC
HEMATOCRIT: 32.2 % — AB (ref 36.0–46.0)
HEMOGLOBIN: 10.3 g/dL — AB (ref 12.0–15.0)
MCH: 27.4 pg (ref 26.0–34.0)
MCHC: 32 g/dL (ref 30.0–36.0)
MCV: 85.6 fL (ref 78.0–100.0)
Platelets: 239 10*3/uL (ref 150–400)
RBC: 3.76 MIL/uL — ABNORMAL LOW (ref 3.87–5.11)
RDW: 15.2 % (ref 11.5–15.5)
WBC: 7.9 10*3/uL (ref 4.0–10.5)

## 2015-02-06 MED ORDER — TAMSULOSIN HCL 0.4 MG PO CAPS
0.4000 mg | ORAL_CAPSULE | Freq: Every day | ORAL | Status: DC
  Administered 2015-02-07 – 2015-02-10 (×4): 0.4 mg via ORAL
  Filled 2015-02-06 (×4): qty 1

## 2015-02-06 MED ORDER — TECHNETIUM TC 99M MEBROFENIN IV KIT: 5.0000 | PACK | Freq: Once | INTRAVENOUS | Status: DC | PRN

## 2015-02-06 MED ORDER — BOOST / RESOURCE BREEZE PO LIQD
1.0000 | Freq: Three times a day (TID) | ORAL | Status: DC
  Administered 2015-02-06: 1 via ORAL
  Administered 2015-02-07: 13:00:00 via ORAL
  Administered 2015-02-07: 1 via ORAL
  Administered 2015-02-07: 10:00:00 via ORAL
  Administered 2015-02-08 – 2015-02-09 (×2): 1 via ORAL

## 2015-02-06 MED ORDER — PRO-STAT SUGAR FREE PO LIQD
30.0000 mL | Freq: Three times a day (TID) | ORAL | Status: DC
  Administered 2015-02-07: 10:00:00 via ORAL
  Filled 2015-02-06 (×5): qty 30

## 2015-02-06 MED ORDER — ADULT MULTIVITAMIN W/MINERALS CH
1.0000 | ORAL_TABLET | Freq: Every day | ORAL | Status: DC
  Administered 2015-02-06 – 2015-02-10 (×5): 1 via ORAL
  Filled 2015-02-06 (×5): qty 1

## 2015-02-06 NOTE — Care Management Note (Signed)
Case Management Note  Patient Details  Name: Gina Fritz MRN: [id number] Date of Birth: 1941/02/26  Subjective/Objective: 74 y/o f admitted w/PNA. From home.                   Action/Plan:d/c plan home.   Expected Discharge Date:   (unknown)               Expected Discharge Plan:  Home/Self Care  In-House Referral:     Discharge planning Services  CM Consult  Post Acute Care Choice:    Choice offered to:     DME Arranged:    DME Agency:     HH Arranged:    HH Agency:     Status of Service:  In process, will continue to follow  Medicare Important Message Given:    Date Medicare IM Given:    Medicare IM give by:    Date Additional Medicare IM Given:    Additional Medicare Important Message give by:     If discussed at [place] of Stay Meetings, dates discussed:    Additional Comments:  Dessa Phi, RN 02/06/2015, 1:01 PM

## 2015-02-06 NOTE — Progress Notes (Signed)
[facility] PROGRESS NOTE  Gina Fritz PXT:[id number] DOB: 1940-05-04 DOA: 02/05/2015 PCP: Stephens Shire, MD  Assessment/Plan: 1. Community-acquired pneumonia- patient started on Levaquin and Flagyl as she has allergy to penicillin. Improving. Blood cultures are negative so far. Urinary strep pneumo antigen negative. 2. Abdominal pain- improved, likely from colitis versus right ureteral stone. Continue pain medications, IV antibiotics. Follow urine culture results. Abdominal ultrasound does not show cholecystitis or any other biliary pathology. Called and discussed with Dr. Dalbert Batman who recommends getting HIDA scan. We'll obtain HIDA scan to rule out cholecystitis. 3. ? Intermittent vaginal bleeding- patient is supposed to follow with GYN as outpatient, will obtain pelvic ultrasound. 4. Colitis- CT abdomen pelvis revealed inflammation in the ascending colon, continue Flagyl and Levaquin. 5. DVT prophylaxis- Lovenox  Code Status: DNR Family Communication: Discussed with patient's husband at bedside Disposition Plan: Home when medically stable   Consultants:  None  Procedures:  Abdominal ultrasound  Antibiotics:  Levaquin  Flagyl   HPI/Subjective: 74 year old female came to the hospital with worsening abdominal pain, recently saw urologist underwent cystoscopy at that time was noted to have blood in the urinary bladder along with 3 mm kidney stone in the right kidney which did not require intervention. In the ED CT chest abdomen pelvis was done which showed right sided colitis, right middle and lower lobe pneumonia, right ureter inflammation and 3 mm stone at the uterocervical junction. Patient started on Levaquin and Flagyl. Abdominal ultrasound was done which did not show any significant abnormality.  This morning patient's abdominal pain has improved. White count is normal. Liver enzymes are back to normal.  Objective: Filed Vitals:   02/06/15 0537  BP:   Pulse:    Temp:   Resp: 14    Intake/Output Summary (Last 24 hours) at 02/06/15 0927 Last data filed at 02/06/15 0700  Gross per 24 hour  Intake 1366.67 ml  Output    350 ml  Net 1016.67 ml   Filed Weights   02/05/15 1908  Weight: 47.174 kg (104 lb)    Exam:   General:  Appears in no acute distress  Cardiovascular: S1-S2 regular  Respiratory: Clear to auscultation bilaterally  Abdomen: Soft, mild tenderness in the right upper quadrant, no organomegaly  Musculoskeletal: No cyanosis/clubbing/edema of the lower extremities   Data Reviewed: Basic Metabolic Panel:  Recent Labs Lab 02/05/15 1324 02/06/15 0422  NA 136 140  K 4.8 4.1  CL 99* 105  CO2 28 25  GLUCOSE 117* 70  BUN 18 12  CREATININE 1.30* 0.85  CALCIUM 10.1 9.1   Liver Function Tests:  Recent Labs Lab 02/05/15 1324 02/06/15 0422  AST 66* 36  ALT 55* 37  ALKPHOS 139* 112  BILITOT 1.3* 0.5  PROT 7.4 6.0*  ALBUMIN 3.8 3.0*    Recent Labs Lab 02/05/15 1324  LIPASE 23   No results for input(s): AMMONIA in the last 168 hours. CBC:  Recent Labs Lab 02/05/15 1324 02/06/15 0422  WBC 12.4* 7.9  HGB 11.6* 10.3*  HCT 36.2 32.2*  MCV 84.6 85.6  PLT 285 239    CBG: No results for input(s): GLUCAP in the last 168 hours.  Recent Results (from the past 240 hour(s))  Urine culture     Status: None (Preliminary result)   Collection Time: 02/05/15  1:39 PM  Result Value Ref Range Status   Specimen Description URINE, CLEAN CATCH  Final   Special Requests NONE  Final   Culture   Final  NO GROWTH < 12 HOURS Performed at [facility]    Report Status PENDING  Incomplete     Studies: Dg Chest 2 View  02/05/2015  CLINICAL DATA:  Productive cough for 1 week EXAM: CHEST  2 VIEW COMPARISON:  Chest CT July 25, 2006 FINDINGS: There is focal airspace consolidation in the right middle lobe. The lungs elsewhere are clear. Heart size and pulmonary vascularity are normal. No adenopathy. There is mild  levoscoliosis in the mid thoracic region. IMPRESSION: Airspace consolidation in the right middle lobe. Lungs elsewhere clear. Followup PA and lateral chest radiographs recommended in 3-4 weeks following trial of antibiotic therapy to ensure resolution and exclude underlying malignancy. Electronically Signed   By: Lowella Grip III M.D.   On: 02/05/2015 14:53   Ct Chest W Contrast  02/05/2015  ADDENDUM REPORT: 02/05/2015 17:41 ADDENDUM: Aberrant right subclavian artery incidentally noted. Electronically Signed   By: Nolon Nations M.D.   On: 02/05/2015 17:41  02/05/2015  CLINICAL DATA:  Recently had cystoscopy, found a kidney stone/blood in bladder, recommended to see a PT (pain) and GYN (vaginal bleeding). Has been feeling bad x 1 week. Came here today for worsening pain in abdomen into back and pain in her neck. EXAM: CT CHEST, ABDOMEN, AND PELVIS WITH CONTRAST TECHNIQUE: Multidetector CT imaging of the chest, abdomen and pelvis was performed following the standard protocol during bolus administration of intravenous contrast. CONTRAST:  54mL OMNIPAQUE IOHEXOL 300 MG/ML SOLN, 18mL OMNIPAQUE IOHEXOL 300 MG/ML SOLN COMPARISON:  Chest x-ray 02/05/2015, chest CT 07/25/2006 FINDINGS: CT CHEST FINDINGS Mediastinum/Nodes: And 1.9 cm low-attenuation lesion is identified within the right lobe of the thyroid. This appears stable. Small likely reactive mediastinal lymph nodes present. The esophagus is normal in appearance. Coronary artery calcifications are present. No pericardial effusions. Heart size is normal. There is moderate atherosclerotic calcification of the thoracic aorta. No associated aneurysm. Lungs/Pleura: Significant emphysematous changes are identified, similar in appearance to prior study. Numerous subpleural nodules also stable there has been development of right middle and lower lobe infiltrate. Trace right pleural effusion is present. Musculoskeletal: No suspicious lytic or blastic lesions are  identified. CT ABDOMEN PELVIS FINDINGS Hepatobiliary: No focal abnormality identified within the liver. There is mild intrahepatic biliary duct dilatation. Gallbladder is mildly distended. No radiopaque calculi are identified within the gallbladder. Pancreas: No focal abnormality identified within the pancreas. There is mild dilatation of the pancreatic duct. Spleen: Spleen is mildly prominent in size no focal lesions. Adrenals/Urinary Tract: Left adrenal gland is somewhat prominent in size without discrete lesion. Right adrenal gland is normal in appearance. There is mild right-sided hydroureter and enhancement of the right ureteral wall secondary to a stone at the right ureterovesical junction measuring 3 mm. Bilateral parapelvic and renal parenchymal cysts are present. Stomach/Bowel: The stomach and small bowel loops are normal in appearance. The appendix is surgically absent. There is enhancement and mild thickening of the ascending colonic wall, extending to fall the transverse colon. The descending colon has a normal appearance. Vascular/Lymphatic: There is atherosclerotic calcification of the abdominal aorta. No aneurysm. No retroperitoneal or mesenteric adenopathy. Reproductive: The uterus is present and appears anteflexed. No adnexal mass identified. Other: No free pelvic fluid. Musculoskeletal: Thoracic and lumbar spondylosis. No suspicious lytic or blastic lesions are identified. IMPRESSION: 1. Obstructing right ureteral stone measuring 3 mm at the ureterovesical junction. 2. Enhancement of the right ureteral wall consistent with infection/inflammation. 3. Enhancement of the mucosa of the ascending colonic wall, this issue with  mild wall thickening. Findings are consistent with colitis. 4. Distended gallbladder and mildly distended intrahepatic biliary ducts. This appears to be a new finding. Further evaluation with abdominal ultrasound may be helpful. 5. Coronary artery disease. 6. Atherosclerosis of the  thoracic and abdominal aorta. 7. Mild splenomegaly. Electronically Signed: By: Nolon Nations M.D. On: 02/05/2015 17:03   US Abdomen Complete  02/05/2015  CLINICAL DATA:  74 year old female with abdominal pain EXAM: ULTRASOUND ABDOMEN COMPLETE COMPARISON:  Prior CT abdomen/ pelvis obtained earlier today FINDINGS: Gallbladder: The gallbladder is distended. There is a small amount of sludge layering within the gallbladder lumen. No gallbladder wall thickening or pericholecystic fluid. Per the sonographer, the sonographic Percell Miller sign was negative. Common bile duct: Diameter: Within normal limits at 2 mm Liver: No focal lesion identified. Within normal limits in parenchymal echogenicity. Main portal vein is patent. IVC: No abnormality visualized. Pancreas: Visualized portion unremarkable. Spleen: Size and appearance within normal limits. Right Kidney: Length: 9 cm. Echogenicity within normal limits. No mass or hydronephrosis visualized. 2.1 cm simple cyst in the interpolar region. Left Kidney: Length: 9.6 cm. Echogenicity within normal limits. No mass or hydronephrosis visualized. Abdominal aorta: No aneurysm visualized. Other findings: None. IMPRESSION: 1. Sludge within the gallbladder lumen but no secondary sonographic findings of acute cholecystitis. 2. Simple parapelvic cyst in the interpolar right kidney. 3. No right-sided hydronephrosis by ultrasound. Electronically Signed   By: Jacqulynn Cadet M.D.   On: 02/05/2015 21:22   Ct Abdomen Pelvis W Contrast  02/05/2015  ADDENDUM REPORT: 02/05/2015 17:41 ADDENDUM: Aberrant right subclavian artery incidentally noted. Electronically Signed   By: Nolon Nations M.D.   On: 02/05/2015 17:41  02/05/2015  CLINICAL DATA:  Recently had cystoscopy, found a kidney stone/blood in bladder, recommended to see a PT (pain) and GYN (vaginal bleeding). Has been feeling bad x 1 week. Came here today for worsening pain in abdomen into back and pain in her neck. EXAM: CT CHEST,  ABDOMEN, AND PELVIS WITH CONTRAST TECHNIQUE: Multidetector CT imaging of the chest, abdomen and pelvis was performed following the standard protocol during bolus administration of intravenous contrast. CONTRAST:  46mL OMNIPAQUE IOHEXOL 300 MG/ML SOLN, 130mL OMNIPAQUE IOHEXOL 300 MG/ML SOLN COMPARISON:  Chest x-ray 02/05/2015, chest CT 07/25/2006 FINDINGS: CT CHEST FINDINGS Mediastinum/Nodes: And 1.9 cm low-attenuation lesion is identified within the right lobe of the thyroid. This appears stable. Small likely reactive mediastinal lymph nodes present. The esophagus is normal in appearance. Coronary artery calcifications are present. No pericardial effusions. Heart size is normal. There is moderate atherosclerotic calcification of the thoracic aorta. No associated aneurysm. Lungs/Pleura: Significant emphysematous changes are identified, similar in appearance to prior study. Numerous subpleural nodules also stable there has been development of right middle and lower lobe infiltrate. Trace right pleural effusion is present. Musculoskeletal: No suspicious lytic or blastic lesions are identified. CT ABDOMEN PELVIS FINDINGS Hepatobiliary: No focal abnormality identified within the liver. There is mild intrahepatic biliary duct dilatation. Gallbladder is mildly distended. No radiopaque calculi are identified within the gallbladder. Pancreas: No focal abnormality identified within the pancreas. There is mild dilatation of the pancreatic duct. Spleen: Spleen is mildly prominent in size no focal lesions. Adrenals/Urinary Tract: Left adrenal gland is somewhat prominent in size without discrete lesion. Right adrenal gland is normal in appearance. There is mild right-sided hydroureter and enhancement of the right ureteral wall secondary to a stone at the right ureterovesical junction measuring 3 mm. Bilateral parapelvic and renal parenchymal cysts are present. Stomach/Bowel: The stomach and  small bowel loops are normal in  appearance. The appendix is surgically absent. There is enhancement and mild thickening of the ascending colonic wall, extending to fall the transverse colon. The descending colon has a normal appearance. Vascular/Lymphatic: There is atherosclerotic calcification of the abdominal aorta. No aneurysm. No retroperitoneal or mesenteric adenopathy. Reproductive: The uterus is present and appears anteflexed. No adnexal mass identified. Other: No free pelvic fluid. Musculoskeletal: Thoracic and lumbar spondylosis. No suspicious lytic or blastic lesions are identified. IMPRESSION: 1. Obstructing right ureteral stone measuring 3 mm at the ureterovesical junction. 2. Enhancement of the right ureteral wall consistent with infection/inflammation. 3. Enhancement of the mucosa of the ascending colonic wall, this issue with mild wall thickening. Findings are consistent with colitis. 4. Distended gallbladder and mildly distended intrahepatic biliary ducts. This appears to be a new finding. Further evaluation with abdominal ultrasound may be helpful. 5. Coronary artery disease. 6. Atherosclerosis of the thoracic and abdominal aorta. 7. Mild splenomegaly. Electronically Signed: By: Nolon Nations M.D. On: 02/05/2015 17:03    Scheduled Meds: . atorvastatin  40 mg Oral Daily  . darifenacin  15 mg Oral Daily  . enoxaparin (LOVENOX) injection  40 mg Subcutaneous Q24H  . escitalopram  20 mg Oral Daily  . hydrALAZINE  25 mg Oral BID  . [START ON 02/07/2015] levofloxacin (LEVAQUIN) IV  750 mg Intravenous Q48H  . metronidazole  500 mg Intravenous Q8H  . montelukast  10 mg Oral Daily   Continuous Infusions: . sodium chloride 100 mL/hr at 02/06/15 0540    Active Problems:   Abdominal pain   Pneumonia   CAP (community acquired pneumonia)   Kidney stone on right side   Colitis    Time spent: 25 min    [facility] Pager [phone]. If 7PM-7AM, please contact night-coverage at [email or web address],  password [facility] 02/06/2015, 9:27 AM  LOS: 1 day

## 2015-02-06 NOTE — Progress Notes (Signed)
Initial Nutrition Assessment  DOCUMENTATION CODES:   Non-severe (moderate) malnutrition in context of chronic illness  INTERVENTION:  - Provide Boost Breeze TID (each supplement contains 250 kcal and 9 gm protein). - Provide ProStat TID (each supplement contains 100 kcal and 15 gm protein).  - Encourage patient's PO intake. - Provide Multi-vitamin.  - RD team to continue to monitor for needs.    NUTRITION DIAGNOSIS:   Inadequate oral intake related to poor appetite as evidenced by estimated needs, per patient/family report.   GOAL:   Patient will meet greater than or equal to 90% of their needs   MONITOR:   PO intake, Supplement acceptance, Diet advancement, Labs, Weight trends  REASON FOR ASSESSMENT:   Malnutrition Screening Tool    ASSESSMENT:   74 year old female who  has a past medical history of Hypertension; Depression; Allergic rhinitis; Rash; Chronic ethmoidal sinusitis; Chronic maxillary sinusitis; Chronic airway obstruction, not elsewhere classified; Irritable bowel syndrome; Unspecified protein-calorie malnutrition ([place]); Pure hypercholesterolemia; Sensorineural hearing loss, unspecified; Sensorineural hearing loss, unilateral; Subjective tinnitus; Tobacco use disorder; Loss of weight; and Dizziness.  Patient was lying in bed during visit for MST, alert but keep her eyes closed. Family was present in room and helped to provide historical data. Patient reports abdominal pain, some recent nausea, and long-term history of poor appetite for greater than 3 months. Patient denies swallowing problems or bowel dysfunction.   Per nurse, patient's diet was NPO last night in preparation for a [place] scan today. Diet has been advanced to clear liquids.   Patient reports a long term history of poor PO intake. Patient usually drinks 3-4 cups of coffee with sugar and half/half. She may eat a boiled egg in the afternoon. Patient has a variable dinner intake. Usual dinner foods  include: fish, chicken or beef a few times a week. Patient lives at home and does most of the cooking. Patient and husband eat out a few times a week - enjoying a variety of types of foods: from [place], to dinner at the [employer], to [employer]. Patient rarely drinks water, but does drink approximately 1x coke a day and tries to drink 1x Ensure a day.    Patient states she has been trying to gain weight, with mild compliance to supplements including Ensure. Patient is willing to try supplements for tolerance. Patient's weight is down 3 lbs from 07/16, this is 3% weight loss/ 3+ months (not significant).   NFPE performed - patient has mild fat wasting - orbital, upper arm; and moderate muscle wasting - hand, clavicle, temple, scapula, acromion, knee, calves, thigh.    Labs reviewed: low Heme (10.3)  Medications reviewed.    Diet Order:  Diet clear liquid Room service appropriate?: Yes; Fluid consistency:: Thin  Skin:  Reviewed, no issues  Last BM:  11/2  Height:   Ht Readings from Last 1 Encounters:  02/05/15 5\' 2"  (1.575 m)    Weight:   Wt Readings from Last 1 Encounters:  02/05/15 104 lb (47.174 kg)    Ideal Body Weight:  50 kg  BMI:  Body mass index is 19.02 kg/(m^2).  Estimated Nutritional Needs:   Kcal:  1250-1450  Protein:  50-60 gm  Fluid:  >/= 1.5 L/day  EDUCATION NEEDS:   No education needs identified at this time  Kayleen Memos, Dietetic Intern 02/06/2015 3:44 PM

## 2015-02-06 NOTE — Progress Notes (Signed)
Pt returned from [place] scan. Pt has productive cough with brown sputum tinged with blood.

## 2015-02-07 DIAGNOSIS — J9601 Acute respiratory failure with hypoxia: Secondary | ICD-10-CM

## 2015-02-07 DIAGNOSIS — J439 Emphysema, unspecified: Secondary | ICD-10-CM

## 2015-02-07 DIAGNOSIS — R042 Hemoptysis: Secondary | ICD-10-CM

## 2015-02-07 DIAGNOSIS — J189 Pneumonia, unspecified organism: Principal | ICD-10-CM

## 2015-02-07 LAB — LEGIONELLA PNEUMOPHILA SEROGP 1 UR AG: L. pneumophila Serogp 1 Ur Ag: NEGATIVE

## 2015-02-07 LAB — URINE CULTURE

## 2015-02-07 MED ORDER — ALBUTEROL SULFATE (2.5 MG/3ML) 0.083% IN NEBU: 2.5000 mg | INHALATION_SOLUTION | RESPIRATORY_TRACT | Status: DC | PRN

## 2015-02-07 NOTE — Consult Note (Signed)
PCCM PROGRESS NOTE  ADMISSION DATE: 02/05/2015 CONSULT DATE: 02/07/2015 REFERRING PROVIDER: Darrick Meigs  CC: Hemoptysis  HPI: 74 yo female smoker presented with abdominal pain for about 2 weeks duration.  She had recent evaluation by urology for hematuria and possible vaginal bleeding.  She also developed a productive cough and low grade fever.  She was admitted by hospitalist service.  She was found to have PNA and colitis.  She was started on Abx.  She was noted to have some hemoptysis.  PCCM was asked to assess.  PAST MEDICAL HISTORY: She  has a past medical history of Hypertension; Depression; Allergic rhinitis; Rash; Chronic ethmoidal sinusitis; Chronic maxillary sinusitis; Chronic airway obstruction, not elsewhere classified; Irritable bowel syndrome; Unspecified protein-calorie malnutrition ([place]); Pure hypercholesterolemia; Sensorineural hearing loss, unspecified; Sensorineural hearing loss, unilateral; Subjective tinnitus; Tobacco use disorder; Loss of weight; and Dizziness.  PAST SURGICAL HISTORY: She  has past surgical history that includes Nasal sinus surgery; Appendectomy; rhinologic surgery; Tubal ligation; Cataract extraction (Left); and Cystoscopy.  FAMILY HISTORY: Her family history includes Congestive Heart Failure in her mother; Hearing loss in her mother; Hypertension in her father; Other in an other family member; Stroke in her mother.  SOCIAL HISTORY: She  reports that she has been smoking Cigarettes.  She has a 25 pack-year smoking history. She has never used smokeless tobacco. She reports that she drinks about 0.6 oz of alcohol per week. She reports that she does not use illicit drugs.  Allergies  Allergen Reactions  . Penicillins Anaphylaxis    Has patient had a PCN reaction causing immediate rash, facial/tongue/throat swelling, SOB or lightheadedness with hypotension: Yes Has patient had a PCN reaction causing severe rash involving mucus membranes or skin necrosis: Yes Has  patient had a PCN reaction that required hospitalization Yes Has patient had a PCN reaction occurring within the last 10 years: No If all of the above answers are "NO", then may proceed with Cephalosporin use.   . Soy Allergy Itching and Rash  . Sulfonamide Derivatives Rash    Medication Sig  . aspirin EC 81 MG tablet Take 81 mg by mouth daily.  Marland Kitchen atorvastatin (LIPITOR) 40 MG tablet Take 40 mg by mouth daily.  . DONNATAL 16.2 MG tablet 16.2 tablets daily.  Marland Kitchen escitalopram (LEXAPRO) 20 MG tablet   . fluticasone (FLONASE) 50 MCG/ACT nasal spray Place 2 sprays into the nose daily.  . hydrochlorothiazide (HYDRODIURIL) 25 MG tablet Take 25 mg by mouth daily.  Marland Kitchen loperamide (IMODIUM) 2 MG capsule Take 2-4 mg by mouth 4 (four) times daily as needed for diarrhea or loose stools. For upset stomach  . losartan (COZAAR) 50 MG tablet Take 50 mg by mouth 2 (two) times daily.   . meclizine (ANTIVERT) 25 MG tablet Take 25 mg by mouth daily as needed for dizziness.   . mometasone (ASMANEX) 220 MCG/INH inhaler Inhale 2 puffs into the lungs 2 (two) times daily.   . montelukast (SINGULAIR) 10 MG tablet Take 10 mg by mouth daily.   . Multiple Vitamin (MULTIVITAMIN WITH MINERALS) TABS Take 1 tablet by mouth daily.  . naproxen sodium (ANAPROX) 220 MG tablet Take 220 mg by mouth daily. For pain  . ranitidine (ZANTAC) 300 MG tablet Take 300 mg by mouth 2 (two) times daily.  . solifenacin (VESICARE) 10 MG tablet Take 10 mg by mouth daily.    ROS: C/o diarrhea  SUBJECTIVE: Feels anxious.  OBJECTIVE: BP 124/60 mmHg  Pulse 76  Temp(Src) 98.4 F (36.9 C) (Oral)  Resp 22  Ht 5\' 2"  (1.575 m)  Wt 104 lb (47.174 kg)  BMI 19.02 kg/m2  SpO2 94%  I/O last 3 completed shifts: In: 4083.3 [P.O.:120; I.V.:3463.3; IV Piggyback:500] Out: 1150 [Urine:1150]  General: thin HEENT: no sinus tenderness, pupils reactive, raspy/hoarse voice Cardiac: regular, no murmur Chest: decreased BS, faint crackles Rt base, no  wheeze Abd: soft, non tener Ext: no edema Neuro: normal strength, CN intact Skin: no rashes   CMP Latest Ref Rng 02/06/2015 02/05/2015 10/19/2014  Glucose 65 - 99 mg/dL 70 117(H) 124(H)  BUN 6 - 20 mg/dL 12 18 15   Creatinine 0.44 - 1.00 mg/dL 0.85 1.30(H) 1.10(H)  Sodium 135 - 145 mmol/L 140 136 139  Potassium 3.5 - 5.1 mmol/L 4.1 4.8 3.3(L)  Chloride 101 - 111 mmol/L 105 99(L) 104  CO2 22 - 32 mmol/L 25 28 28   Calcium 8.9 - 10.3 mg/dL 9.1 10.1 9.0  Total Protein 6.5 - 8.1 g/dL 6.0(L) 7.4 6.8  Total Bilirubin 0.3 - 1.2 mg/dL 0.5 1.3(H) 0.4  Alkaline Phos 38 - 126 U/L 112 139(H) 81  AST 15 - 41 U/L 36 66(H) 26  ALT 14 - 54 U/L 37 55(H) 16     CBC Latest Ref Rng 02/06/2015 02/05/2015 10/19/2014  WBC 4.0 - 10.5 K/uL 7.9 12.4(H) 5.8  Hemoglobin 12.0 - 15.0 g/dL 10.3(L) 11.6(L) 12.3  Hematocrit 36.0 - 46.0 % 32.2(L) 36.2 37.6  Platelets 150 - 400 K/uL 239 285 235     Dg Chest 2 View  02/05/2015  CLINICAL DATA:  Productive cough for 1 week EXAM: CHEST  2 VIEW COMPARISON:  Chest CT July 25, 2006 FINDINGS: There is focal airspace consolidation in the right middle lobe. The lungs elsewhere are clear. Heart size and pulmonary vascularity are normal. No adenopathy. There is mild levoscoliosis in the mid thoracic region. IMPRESSION: Airspace consolidation in the right middle lobe. Lungs elsewhere clear. Followup PA and lateral chest radiographs recommended in 3-4 weeks following trial of antibiotic therapy to ensure resolution and exclude underlying malignancy. Electronically Signed   By: Lowella Grip III M.D.   On: 02/05/2015 14:53   Ct Chest W Contrast  02/05/2015  ADDENDUM REPORT: 02/05/2015 17:41 ADDENDUM: Aberrant right subclavian artery incidentally noted. Electronically Signed   By: Nolon Nations M.D.   On: 02/05/2015 17:41  02/05/2015  CLINICAL DATA:  Recently had cystoscopy, found a kidney stone/blood in bladder, recommended to see a PT (pain) and GYN (vaginal bleeding). Has been  feeling bad x 1 week. Came here today for worsening pain in abdomen into back and pain in her neck. EXAM: CT CHEST, ABDOMEN, AND PELVIS WITH CONTRAST TECHNIQUE: Multidetector CT imaging of the chest, abdomen and pelvis was performed following the standard protocol during bolus administration of intravenous contrast. CONTRAST:  42mL OMNIPAQUE IOHEXOL 300 MG/ML SOLN, 156mL OMNIPAQUE IOHEXOL 300 MG/ML SOLN COMPARISON:  Chest x-ray 02/05/2015, chest CT 07/25/2006 FINDINGS: CT CHEST FINDINGS Mediastinum/Nodes: And 1.9 cm low-attenuation lesion is identified within the right lobe of the thyroid. This appears stable. Small likely reactive mediastinal lymph nodes present. The esophagus is normal in appearance. Coronary artery calcifications are present. No pericardial effusions. Heart size is normal. There is moderate atherosclerotic calcification of the thoracic aorta. No associated aneurysm. Lungs/Pleura: Significant emphysematous changes are identified, similar in appearance to prior study. Numerous subpleural nodules also stable there has been development of right middle and lower lobe infiltrate. Trace right pleural effusion is present. Musculoskeletal: No suspicious lytic or blastic lesions are  identified. CT ABDOMEN PELVIS FINDINGS Hepatobiliary: No focal abnormality identified within the liver. There is mild intrahepatic biliary duct dilatation. Gallbladder is mildly distended. No radiopaque calculi are identified within the gallbladder. Pancreas: No focal abnormality identified within the pancreas. There is mild dilatation of the pancreatic duct. Spleen: Spleen is mildly prominent in size no focal lesions. Adrenals/Urinary Tract: Left adrenal gland is somewhat prominent in size without discrete lesion. Right adrenal gland is normal in appearance. There is mild right-sided hydroureter and enhancement of the right ureteral wall secondary to a stone at the right ureterovesical junction measuring 3 mm. Bilateral  parapelvic and renal parenchymal cysts are present. Stomach/Bowel: The stomach and small bowel loops are normal in appearance. The appendix is surgically absent. There is enhancement and mild thickening of the ascending colonic wall, extending to fall the transverse colon. The descending colon has a normal appearance. Vascular/Lymphatic: There is atherosclerotic calcification of the abdominal aorta. No aneurysm. No retroperitoneal or mesenteric adenopathy. Reproductive: The uterus is present and appears anteflexed. No adnexal mass identified. Other: No free pelvic fluid. Musculoskeletal: Thoracic and lumbar spondylosis. No suspicious lytic or blastic lesions are identified. IMPRESSION: 1. Obstructing right ureteral stone measuring 3 mm at the ureterovesical junction. 2. Enhancement of the right ureteral wall consistent with infection/inflammation. 3. Enhancement of the mucosa of the ascending colonic wall, this issue with mild wall thickening. Findings are consistent with colitis. 4. Distended gallbladder and mildly distended intrahepatic biliary ducts. This appears to be a new finding. Further evaluation with abdominal ultrasound may be helpful. 5. Coronary artery disease. 6. Atherosclerosis of the thoracic and abdominal aorta. 7. Mild splenomegaly. Electronically Signed: By: Nolon Nations M.D. On: 02/05/2015 17:03   Nm Hepatobiliary Liver Func  02/06/2015  CLINICAL DATA:  Abdominal pain and nausea. EXAM: NUCLEAR MEDICINE HEPATOBILIARY IMAGING TECHNIQUE: Sequential images of the abdomen were obtained out to 60 minutes following intravenous administration of radiopharmaceutical. RADIOPHARMACEUTICALS:  5.4 mCi Tc-66m  Choletec IV COMPARISON:  Ultrasound dated 02/05/2015 FINDINGS: The gallbladder is visualized at 15 minutes. Activity is seen in the bowel at 30 minutes. IMPRESSION: Normal hepatobiliary scan. Electronically Signed   By: Lorriane Shire M.D.   On: 02/06/2015 13:38   US Abdomen  Complete  02/05/2015  CLINICAL DATA:  74 year old female with abdominal pain EXAM: ULTRASOUND ABDOMEN COMPLETE COMPARISON:  Prior CT abdomen/ pelvis obtained earlier today FINDINGS: Gallbladder: The gallbladder is distended. There is a small amount of sludge layering within the gallbladder lumen. No gallbladder wall thickening or pericholecystic fluid. Per the sonographer, the sonographic Percell Miller sign was negative. Common bile duct: Diameter: Within normal limits at 2 mm Liver: No focal lesion identified. Within normal limits in parenchymal echogenicity. Main portal vein is patent. IVC: No abnormality visualized. Pancreas: Visualized portion unremarkable. Spleen: Size and appearance within normal limits. Right Kidney: Length: 9 cm. Echogenicity within normal limits. No mass or hydronephrosis visualized. 2.1 cm simple cyst in the interpolar region. Left Kidney: Length: 9.6 cm. Echogenicity within normal limits. No mass or hydronephrosis visualized. Abdominal aorta: No aneurysm visualized. Other findings: None. IMPRESSION: 1. Sludge within the gallbladder lumen but no secondary sonographic findings of acute cholecystitis. 2. Simple parapelvic cyst in the interpolar right kidney. 3. No right-sided hydronephrosis by ultrasound. Electronically Signed   By: Jacqulynn Cadet M.D.   On: 02/05/2015 21:22   US Transvaginal Non-ob  02/06/2015  CLINICAL DATA:  Vaginal bleeding for 3 weeks EXAM: TRANSABDOMINAL AND TRANSVAGINAL ULTRASOUND OF PELVIS TECHNIQUE: Both transabdominal and transvaginal ultrasound examinations of  the pelvis were performed. Transabdominal technique was performed for global imaging of the pelvis including uterus, ovaries, adnexal regions, and pelvic cul-de-sac. It was necessary to proceed with endovaginal exam following the transabdominal exam to visualize the endometrium. COMPARISON:  CT 02/05/2015 FINDINGS: Uterus Measurements: 5.9 x 3.0 x 3.8 cm. No fibroids or other mass visualized. Endometrium  Thickness: 2 mm.  No focal abnormality visualized. Right ovary Measurements: Not visualized. No adnexal masses seen. Left ovary Measurements: Not visualized. No adnexal masses seen Other findings No free fluid. IMPRESSION: No acute findings. Ovaries cannot be visualized due to overlying bowel gas. Electronically Signed   By: Rolm Baptise M.D.   On: 02/06/2015 16:40   US Pelvis Complete  02/06/2015  CLINICAL DATA:  Vaginal bleeding for 3 weeks EXAM: TRANSABDOMINAL AND TRANSVAGINAL ULTRASOUND OF PELVIS TECHNIQUE: Both transabdominal and transvaginal ultrasound examinations of the pelvis were performed. Transabdominal technique was performed for global imaging of the pelvis including uterus, ovaries, adnexal regions, and pelvic cul-de-sac. It was necessary to proceed with endovaginal exam following the transabdominal exam to visualize the endometrium. COMPARISON:  CT 02/05/2015 FINDINGS: Uterus Measurements: 5.9 x 3.0 x 3.8 cm. No fibroids or other mass visualized. Endometrium Thickness: 2 mm.  No focal abnormality visualized. Right ovary Measurements: Not visualized. No adnexal masses seen. Left ovary Measurements: Not visualized. No adnexal masses seen Other findings No free fluid. IMPRESSION: No acute findings. Ovaries cannot be visualized due to overlying bowel gas. Electronically Signed   By: Rolm Baptise M.D.   On: 02/06/2015 16:40   Ct Abdomen Pelvis W Contrast  02/05/2015  ADDENDUM REPORT: 02/05/2015 17:41 ADDENDUM: Aberrant right subclavian artery incidentally noted. Electronically Signed   By: Nolon Nations M.D.   On: 02/05/2015 17:41  02/05/2015  CLINICAL DATA:  Recently had cystoscopy, found a kidney stone/blood in bladder, recommended to see a PT (pain) and GYN (vaginal bleeding). Has been feeling bad x 1 week. Came here today for worsening pain in abdomen into back and pain in her neck. EXAM: CT CHEST, ABDOMEN, AND PELVIS WITH CONTRAST TECHNIQUE: Multidetector CT imaging of the chest, abdomen  and pelvis was performed following the standard protocol during bolus administration of intravenous contrast. CONTRAST:  77mL OMNIPAQUE IOHEXOL 300 MG/ML SOLN, 151mL OMNIPAQUE IOHEXOL 300 MG/ML SOLN COMPARISON:  Chest x-ray 02/05/2015, chest CT 07/25/2006 FINDINGS: CT CHEST FINDINGS Mediastinum/Nodes: And 1.9 cm low-attenuation lesion is identified within the right lobe of the thyroid. This appears stable. Small likely reactive mediastinal lymph nodes present. The esophagus is normal in appearance. Coronary artery calcifications are present. No pericardial effusions. Heart size is normal. There is moderate atherosclerotic calcification of the thoracic aorta. No associated aneurysm. Lungs/Pleura: Significant emphysematous changes are identified, similar in appearance to prior study. Numerous subpleural nodules also stable there has been development of right middle and lower lobe infiltrate. Trace right pleural effusion is present. Musculoskeletal: No suspicious lytic or blastic lesions are identified. CT ABDOMEN PELVIS FINDINGS Hepatobiliary: No focal abnormality identified within the liver. There is mild intrahepatic biliary duct dilatation. Gallbladder is mildly distended. No radiopaque calculi are identified within the gallbladder. Pancreas: No focal abnormality identified within the pancreas. There is mild dilatation of the pancreatic duct. Spleen: Spleen is mildly prominent in size no focal lesions. Adrenals/Urinary Tract: Left adrenal gland is somewhat prominent in size without discrete lesion. Right adrenal gland is normal in appearance. There is mild right-sided hydroureter and enhancement of the right ureteral wall secondary to a stone at the right ureterovesical junction measuring  3 mm. Bilateral parapelvic and renal parenchymal cysts are present. Stomach/Bowel: The stomach and small bowel loops are normal in appearance. The appendix is surgically absent. There is enhancement and mild thickening of the  ascending colonic wall, extending to fall the transverse colon. The descending colon has a normal appearance. Vascular/Lymphatic: There is atherosclerotic calcification of the abdominal aorta. No aneurysm. No retroperitoneal or mesenteric adenopathy. Reproductive: The uterus is present and appears anteflexed. No adnexal mass identified. Other: No free pelvic fluid. Musculoskeletal: Thoracic and lumbar spondylosis. No suspicious lytic or blastic lesions are identified. IMPRESSION: 1. Obstructing right ureteral stone measuring 3 mm at the ureterovesical junction. 2. Enhancement of the right ureteral wall consistent with infection/inflammation. 3. Enhancement of the mucosa of the ascending colonic wall, this issue with mild wall thickening. Findings are consistent with colitis. 4. Distended gallbladder and mildly distended intrahepatic biliary ducts. This appears to be a new finding. Further evaluation with abdominal ultrasound may be helpful. 5. Coronary artery disease. 6. Atherosclerosis of the thoracic and abdominal aorta. 7. Mild splenomegaly. Electronically Signed: By: Nolon Nations M.D. On: 02/05/2015 17:03     CULTURES: Urine 11/03 >> Pneumococcal Ag 11/03 >> negative Legionella Ag 11/03 >>   ANTIBIOTICS: Levaquin 11/03 >> Flagyl 11/03 >>  STUDIES: 11/03 CT chest >> centrilobular emphysema, RML/RLL infiltrate 11/03 CT abd/pelvis >> mild GB distention, mild Rt hydroureter with 3 mm stone at ureterovesical junction, mild thickening of ascending colon and transverse colon 11/03 Abd u/s >> sludge in GB 11/04 HIDA scan >> normal  11/04 Pelvic/vaginal u/s >> no acute findings  EVENTS: 11/03 Admit  DISCUSSION: 74 yo female smoker with CAP and hemoptysis.  CT chest findings consistent with emphysema also.  No definite mass on CT chest.  She has stable nodules since 2006 >> benign.  ASSESSMENT/PLAN:  Hemoptysis in setting of CAP. Plan: - continue Abx - f/u CXR - defer bronchoscopy for  now  Hx of tobacco abuse with emphysema on CT chest. Plan: - smoking cessation - will need PFTs to assess for COPD as outpt when more stable - prn albuterol - bronchial hygiene  Acute hypoxic respiratory failure. Plan: - adjust oxygen to keep SpO2 90 to 95% - will need to assess for home oxygen therapy prior to discharge  Goals of care >> DNR.  Updated pt's daughter.  She will need outpt pulmonary follow up after discharge.  D/w Dr. Darrick Meigs.  Chesley Mires, MD [facility] Pulmonary/Critical Care 02/07/2015, 11:59 AM Pager:  [phone] After 3pm call: [phone]

## 2015-02-07 NOTE — Progress Notes (Addendum)
[facility] PROGRESS NOTE  Gina Fritz SWN:[id number] DOB: 04-Dec-1940 DOA: 02/05/2015 PCP: Stephens Shire, MD  Assessment/Plan: 1. Community-acquired pneumonia- patient started on Levaquin and Flagyl as she has allergy to penicillin. Improving. Blood cultures are negative so far. Urinary strep pneumo antigen negative. 2. Hemoptysis- likely due to above. But patient had workup done in the past for lung mass. Will request pulmonary consultation for further evaluation. 3. Abdominal pain- improved, likely from colitis versus right ureteral stone. Continue pain medications, IV antibiotics. Follow urine culture results. Abdominal ultrasound does not show cholecystitis or any other biliary pathology. Called and discussed with Dr. Dalbert Batman who recommended getting HIDA scan. We obtained HIDA scan , which was negative for cholecystitis. 4. ? Intermittent vaginal bleeding- patient is supposed to follow with GYN as outpatient,  obtained pelvic ultrasound which was negative for significant abnormal ready. 5. Colitis- CT abdomen pelvis revealed inflammation in the ascending colon, continue Flagyl and Levaquin. 6. DVT prophylaxis- we will discontinue Lovenox in the setting of hemoptysis, start bilateral SCDs  Code Status: DNR Family Communication: Discussed with patient's husband at bedside Disposition Plan: Home when medically stable   Consultants:  None  Procedures:  Abdominal ultrasound  Antibiotics:  Levaquin  Flagyl   HPI/Subjective: 74 year old female came to the hospital with worsening abdominal pain, recently saw urologist underwent cystoscopy at that time was noted to have blood in the urinary bladder along with 3 mm kidney stone in the right kidney which did not require intervention. In the ED CT chest abdomen pelvis was done which showed right sided colitis, right middle and lower lobe pneumonia, right ureter inflammation and 3 mm stone at the uterocervical junction. Patient  started on Levaquin and Flagyl. Abdominal ultrasound was done which did not show any significant abnormality.  HIDA scan was done which was negative for cholecystitis, pelvic ultrasound showed no significant abnormality. Patient is slowly improving but continues to have cough with hemoptysis.  Objective: Filed Vitals:   02/07/15 0959  BP: 124/60  Pulse: 76  Temp: 98.4 F (36.9 C)  Resp: 22    Intake/Output Summary (Last 24 hours) at 02/07/15 1402 Last data filed at 02/07/15 1100  Gross per 24 hour  Intake 2836.67 ml  Output    600 ml  Net 2236.67 ml   Filed Weights   02/05/15 1908  Weight: 47.174 kg (104 lb)    Exam:   General:  Appears in no acute distress  Cardiovascular: S1-S2 regular  Respiratory: Clear to auscultation bilaterally  Abdomen: Soft, mild tenderness in the right upper quadrant, no organomegaly  Musculoskeletal: No cyanosis/clubbing/edema of the lower extremities   Data Reviewed: Basic Metabolic Panel:  Recent Labs Lab 02/05/15 1324 02/06/15 0422  NA 136 140  K 4.8 4.1  CL 99* 105  CO2 28 25  GLUCOSE 117* 70  BUN 18 12  CREATININE 1.30* 0.85  CALCIUM 10.1 9.1   Liver Function Tests:  Recent Labs Lab 02/05/15 1324 02/06/15 0422  AST 66* 36  ALT 55* 37  ALKPHOS 139* 112  BILITOT 1.3* 0.5  PROT 7.4 6.0*  ALBUMIN 3.8 3.0*    Recent Labs Lab 02/05/15 1324  LIPASE 23   No results for input(s): AMMONIA in the last 168 hours. CBC:  Recent Labs Lab 02/05/15 1324 02/06/15 0422  WBC 12.4* 7.9  HGB 11.6* 10.3*  HCT 36.2 32.2*  MCV 84.6 85.6  PLT 285 239    CBG: No results for input(s): GLUCAP in the last 168 hours.  Recent Results (from the past 240 hour(s))  Urine culture     Status: None (Preliminary result)   Collection Time: 02/05/15  1:39 PM  Result Value Ref Range Status   Specimen Description URINE, CLEAN CATCH  Final   Special Requests NONE  Final   Culture   Final    CULTURE REINCUBATED FOR BETTER  GROWTH Performed at [facility]    Report Status PENDING  Incomplete     Studies: Dg Chest 2 View  02/05/2015  CLINICAL DATA:  Productive cough for 1 week EXAM: CHEST  2 VIEW COMPARISON:  Chest CT July 25, 2006 FINDINGS: There is focal airspace consolidation in the right middle lobe. The lungs elsewhere are clear. Heart size and pulmonary vascularity are normal. No adenopathy. There is mild levoscoliosis in the mid thoracic region. IMPRESSION: Airspace consolidation in the right middle lobe. Lungs elsewhere clear. Followup PA and lateral chest radiographs recommended in 3-4 weeks following trial of antibiotic therapy to ensure resolution and exclude underlying malignancy. Electronically Signed   By: Lowella Grip III M.D.   On: 02/05/2015 14:53   Ct Chest W Contrast  02/05/2015  ADDENDUM REPORT: 02/05/2015 17:41 ADDENDUM: Aberrant right subclavian artery incidentally noted. Electronically Signed   By: Nolon Nations M.D.   On: 02/05/2015 17:41  02/05/2015  CLINICAL DATA:  Recently had cystoscopy, found a kidney stone/blood in bladder, recommended to see a PT (pain) and GYN (vaginal bleeding). Has been feeling bad x 1 week. Came here today for worsening pain in abdomen into back and pain in her neck. EXAM: CT CHEST, ABDOMEN, AND PELVIS WITH CONTRAST TECHNIQUE: Multidetector CT imaging of the chest, abdomen and pelvis was performed following the standard protocol during bolus administration of intravenous contrast. CONTRAST:  54mL OMNIPAQUE IOHEXOL 300 MG/ML SOLN, 176mL OMNIPAQUE IOHEXOL 300 MG/ML SOLN COMPARISON:  Chest x-ray 02/05/2015, chest CT 07/25/2006 FINDINGS: CT CHEST FINDINGS Mediastinum/Nodes: And 1.9 cm low-attenuation lesion is identified within the right lobe of the thyroid. This appears stable. Small likely reactive mediastinal lymph nodes present. The esophagus is normal in appearance. Coronary artery calcifications are present. No pericardial effusions. Heart size is normal.  There is moderate atherosclerotic calcification of the thoracic aorta. No associated aneurysm. Lungs/Pleura: Significant emphysematous changes are identified, similar in appearance to prior study. Numerous subpleural nodules also stable there has been development of right middle and lower lobe infiltrate. Trace right pleural effusion is present. Musculoskeletal: No suspicious lytic or blastic lesions are identified. CT ABDOMEN PELVIS FINDINGS Hepatobiliary: No focal abnormality identified within the liver. There is mild intrahepatic biliary duct dilatation. Gallbladder is mildly distended. No radiopaque calculi are identified within the gallbladder. Pancreas: No focal abnormality identified within the pancreas. There is mild dilatation of the pancreatic duct. Spleen: Spleen is mildly prominent in size no focal lesions. Adrenals/Urinary Tract: Left adrenal gland is somewhat prominent in size without discrete lesion. Right adrenal gland is normal in appearance. There is mild right-sided hydroureter and enhancement of the right ureteral wall secondary to a stone at the right ureterovesical junction measuring 3 mm. Bilateral parapelvic and renal parenchymal cysts are present. Stomach/Bowel: The stomach and small bowel loops are normal in appearance. The appendix is surgically absent. There is enhancement and mild thickening of the ascending colonic wall, extending to fall the transverse colon. The descending colon has a normal appearance. Vascular/Lymphatic: There is atherosclerotic calcification of the abdominal aorta. No aneurysm. No retroperitoneal or mesenteric adenopathy. Reproductive: The uterus is present and appears anteflexed. No  adnexal mass identified. Other: No free pelvic fluid. Musculoskeletal: Thoracic and lumbar spondylosis. No suspicious lytic or blastic lesions are identified. IMPRESSION: 1. Obstructing right ureteral stone measuring 3 mm at the ureterovesical junction. 2. Enhancement of the right  ureteral wall consistent with infection/inflammation. 3. Enhancement of the mucosa of the ascending colonic wall, this issue with mild wall thickening. Findings are consistent with colitis. 4. Distended gallbladder and mildly distended intrahepatic biliary ducts. This appears to be a new finding. Further evaluation with abdominal ultrasound may be helpful. 5. Coronary artery disease. 6. Atherosclerosis of the thoracic and abdominal aorta. 7. Mild splenomegaly. Electronically Signed: By: Nolon Nations M.D. On: 02/05/2015 17:03   Nm Hepatobiliary Liver Func  02/06/2015  CLINICAL DATA:  Abdominal pain and nausea. EXAM: NUCLEAR MEDICINE HEPATOBILIARY IMAGING TECHNIQUE: Sequential images of the abdomen were obtained out to 60 minutes following intravenous administration of radiopharmaceutical. RADIOPHARMACEUTICALS:  5.4 mCi Tc-4m  Choletec IV COMPARISON:  Ultrasound dated 02/05/2015 FINDINGS: The gallbladder is visualized at 15 minutes. Activity is seen in the bowel at 30 minutes. IMPRESSION: Normal hepatobiliary scan. Electronically Signed   By: Lorriane Shire M.D.   On: 02/06/2015 13:38   US Abdomen Complete  02/05/2015  CLINICAL DATA:  74 year old female with abdominal pain EXAM: ULTRASOUND ABDOMEN COMPLETE COMPARISON:  Prior CT abdomen/ pelvis obtained earlier today FINDINGS: Gallbladder: The gallbladder is distended. There is a small amount of sludge layering within the gallbladder lumen. No gallbladder wall thickening or pericholecystic fluid. Per the sonographer, the sonographic Percell Miller sign was negative. Common bile duct: Diameter: Within normal limits at 2 mm Liver: No focal lesion identified. Within normal limits in parenchymal echogenicity. Main portal vein is patent. IVC: No abnormality visualized. Pancreas: Visualized portion unremarkable. Spleen: Size and appearance within normal limits. Right Kidney: Length: 9 cm. Echogenicity within normal limits. No mass or hydronephrosis visualized. 2.1 cm  simple cyst in the interpolar region. Left Kidney: Length: 9.6 cm. Echogenicity within normal limits. No mass or hydronephrosis visualized. Abdominal aorta: No aneurysm visualized. Other findings: None. IMPRESSION: 1. Sludge within the gallbladder lumen but no secondary sonographic findings of acute cholecystitis. 2. Simple parapelvic cyst in the interpolar right kidney. 3. No right-sided hydronephrosis by ultrasound. Electronically Signed   By: Jacqulynn Cadet M.D.   On: 02/05/2015 21:22   US Transvaginal Non-ob  02/06/2015  CLINICAL DATA:  Vaginal bleeding for 3 weeks EXAM: TRANSABDOMINAL AND TRANSVAGINAL ULTRASOUND OF PELVIS TECHNIQUE: Both transabdominal and transvaginal ultrasound examinations of the pelvis were performed. Transabdominal technique was performed for global imaging of the pelvis including uterus, ovaries, adnexal regions, and pelvic cul-de-sac. It was necessary to proceed with endovaginal exam following the transabdominal exam to visualize the endometrium. COMPARISON:  CT 02/05/2015 FINDINGS: Uterus Measurements: 5.9 x 3.0 x 3.8 cm. No fibroids or other mass visualized. Endometrium Thickness: 2 mm.  No focal abnormality visualized. Right ovary Measurements: Not visualized. No adnexal masses seen. Left ovary Measurements: Not visualized. No adnexal masses seen Other findings No free fluid. IMPRESSION: No acute findings. Ovaries cannot be visualized due to overlying bowel gas. Electronically Signed   By: Rolm Baptise M.D.   On: 02/06/2015 16:40   US Pelvis Complete  02/06/2015  CLINICAL DATA:  Vaginal bleeding for 3 weeks EXAM: TRANSABDOMINAL AND TRANSVAGINAL ULTRASOUND OF PELVIS TECHNIQUE: Both transabdominal and transvaginal ultrasound examinations of the pelvis were performed. Transabdominal technique was performed for global imaging of the pelvis including uterus, ovaries, adnexal regions, and pelvic cul-de-sac. It was necessary to proceed with  endovaginal exam following the  transabdominal exam to visualize the endometrium. COMPARISON:  CT 02/05/2015 FINDINGS: Uterus Measurements: 5.9 x 3.0 x 3.8 cm. No fibroids or other mass visualized. Endometrium Thickness: 2 mm.  No focal abnormality visualized. Right ovary Measurements: Not visualized. No adnexal masses seen. Left ovary Measurements: Not visualized. No adnexal masses seen Other findings No free fluid. IMPRESSION: No acute findings. Ovaries cannot be visualized due to overlying bowel gas. Electronically Signed   By: Rolm Baptise M.D.   On: 02/06/2015 16:40   Ct Abdomen Pelvis W Contrast  02/05/2015  ADDENDUM REPORT: 02/05/2015 17:41 ADDENDUM: Aberrant right subclavian artery incidentally noted. Electronically Signed   By: Nolon Nations M.D.   On: 02/05/2015 17:41  02/05/2015  CLINICAL DATA:  Recently had cystoscopy, found a kidney stone/blood in bladder, recommended to see a PT (pain) and GYN (vaginal bleeding). Has been feeling bad x 1 week. Came here today for worsening pain in abdomen into back and pain in her neck. EXAM: CT CHEST, ABDOMEN, AND PELVIS WITH CONTRAST TECHNIQUE: Multidetector CT imaging of the chest, abdomen and pelvis was performed following the standard protocol during bolus administration of intravenous contrast. CONTRAST:  31mL OMNIPAQUE IOHEXOL 300 MG/ML SOLN, 149mL OMNIPAQUE IOHEXOL 300 MG/ML SOLN COMPARISON:  Chest x-ray 02/05/2015, chest CT 07/25/2006 FINDINGS: CT CHEST FINDINGS Mediastinum/Nodes: And 1.9 cm low-attenuation lesion is identified within the right lobe of the thyroid. This appears stable. Small likely reactive mediastinal lymph nodes present. The esophagus is normal in appearance. Coronary artery calcifications are present. No pericardial effusions. Heart size is normal. There is moderate atherosclerotic calcification of the thoracic aorta. No associated aneurysm. Lungs/Pleura: Significant emphysematous changes are identified, similar in appearance to prior study. Numerous subpleural  nodules also stable there has been development of right middle and lower lobe infiltrate. Trace right pleural effusion is present. Musculoskeletal: No suspicious lytic or blastic lesions are identified. CT ABDOMEN PELVIS FINDINGS Hepatobiliary: No focal abnormality identified within the liver. There is mild intrahepatic biliary duct dilatation. Gallbladder is mildly distended. No radiopaque calculi are identified within the gallbladder. Pancreas: No focal abnormality identified within the pancreas. There is mild dilatation of the pancreatic duct. Spleen: Spleen is mildly prominent in size no focal lesions. Adrenals/Urinary Tract: Left adrenal gland is somewhat prominent in size without discrete lesion. Right adrenal gland is normal in appearance. There is mild right-sided hydroureter and enhancement of the right ureteral wall secondary to a stone at the right ureterovesical junction measuring 3 mm. Bilateral parapelvic and renal parenchymal cysts are present. Stomach/Bowel: The stomach and small bowel loops are normal in appearance. The appendix is surgically absent. There is enhancement and mild thickening of the ascending colonic wall, extending to fall the transverse colon. The descending colon has a normal appearance. Vascular/Lymphatic: There is atherosclerotic calcification of the abdominal aorta. No aneurysm. No retroperitoneal or mesenteric adenopathy. Reproductive: The uterus is present and appears anteflexed. No adnexal mass identified. Other: No free pelvic fluid. Musculoskeletal: Thoracic and lumbar spondylosis. No suspicious lytic or blastic lesions are identified. IMPRESSION: 1. Obstructing right ureteral stone measuring 3 mm at the ureterovesical junction. 2. Enhancement of the right ureteral wall consistent with infection/inflammation. 3. Enhancement of the mucosa of the ascending colonic wall, this issue with mild wall thickening. Findings are consistent with colitis. 4. Distended gallbladder and  mildly distended intrahepatic biliary ducts. This appears to be a new finding. Further evaluation with abdominal ultrasound may be helpful. 5. Coronary artery disease. 6. Atherosclerosis of the thoracic and  abdominal aorta. 7. Mild splenomegaly. Electronically Signed: By: Nolon Nations M.D. On: 02/05/2015 17:03    Scheduled Meds: . atorvastatin  40 mg Oral Daily  . darifenacin  15 mg Oral Daily  . enoxaparin (LOVENOX) injection  40 mg Subcutaneous Q24H  . escitalopram  20 mg Oral Daily  . feeding supplement  1 Container Oral TID BM  . feeding supplement (PRO-STAT SUGAR FREE 64)  30 mL Oral TID BM  . hydrALAZINE  25 mg Oral BID  . levofloxacin (LEVAQUIN) IV  750 mg Intravenous Q48H  . metronidazole  500 mg Intravenous Q8H  . montelukast  10 mg Oral Daily  . multivitamin with minerals  1 tablet Oral Daily  . tamsulosin  0.4 mg Oral QPC breakfast   Continuous Infusions: . sodium chloride 100 mL/hr at 02/07/15 1149    Active Problems:   Abdominal pain   Pneumonia   CAP (community acquired pneumonia)   Kidney stone on right side   Colitis    Time spent: 25 min    [facility] Pager [phone]. If 7PM-7AM, please contact night-coverage at [email or web address], password [facility] 02/07/2015, 2:02 PM  LOS: 2 days

## 2015-02-07 NOTE — Plan of Care (Signed)
Problem: Fluid Volume: Goal: Ability to maintain a balanced intake and output will improve Outcome: Progressing Patient able to ambulate with standby assist.

## 2015-02-07 NOTE — Progress Notes (Signed)
Pt was sitting up in chair when I arrived; her daughter was bedside. Pt's husband is a former Producer, television/film/video; both are acquaintances.  Pt explained that she is feeling better than when she first came to hospital. She admitted she couldn't believe she was here. Daughter transitioned to her need for AD. Provided information to daughter, who was familiar w/document. Will forward info to day Chaplain to complete w/witness and notary on Monday.  Please page if assistance is needed prior to that time. Chaplain Marlise Eves Holder   02/07/15 1100  Clinical Encounter Type  Visited With Patient and family together

## 2015-02-08 LAB — BASIC METABOLIC PANEL
Anion gap: 9 (ref 5–15)
BUN: 5 mg/dL — AB (ref 6–20)
CHLORIDE: 107 mmol/L (ref 101–111)
CO2: 25 mmol/L (ref 22–32)
CREATININE: 0.59 mg/dL (ref 0.44–1.00)
Calcium: 8.8 mg/dL — ABNORMAL LOW (ref 8.9–10.3)
GFR calc Af Amer: >60 mL/min (ref >60)
GFR calc non Af Amer: >60 mL/min (ref >60)
Glucose, Bld: 124 mg/dL — ABNORMAL HIGH (ref 65–99)
POTASSIUM: 3.1 mmol/L — AB (ref 3.5–5.1)
SODIUM: 141 mmol/L (ref 135–145)

## 2015-02-08 LAB — CBC
HEMATOCRIT: 32 % — AB (ref 36.0–46.0)
HEMOGLOBIN: 10.1 g/dL — AB (ref 12.0–15.0)
MCH: 27 pg (ref 26.0–34.0)
MCHC: 31.6 g/dL (ref 30.0–36.0)
MCV: 85.6 fL (ref 78.0–100.0)
Platelets: 235 10*3/uL (ref 150–400)
RBC: 3.74 MIL/uL — AB (ref 3.87–5.11)
RDW: 14.8 % (ref 11.5–15.5)
WBC: 5.3 10*3/uL (ref 4.0–10.5)

## 2015-02-08 LAB — C DIFFICILE QUICK SCREEN W PCR REFLEX
C DIFFICLE (CDIFF) ANTIGEN: NEGATIVE
C Diff interpretation: NEGATIVE
C Diff toxin: NEGATIVE

## 2015-02-08 MED ORDER — POTASSIUM CHLORIDE CRYS ER 20 MEQ PO TBCR
40.0000 meq | EXTENDED_RELEASE_TABLET | ORAL | Status: AC
  Administered 2015-02-08 (×2): 40 meq via ORAL
  Filled 2015-02-08 (×2): qty 2

## 2015-02-08 MED ORDER — LOPERAMIDE HCL 2 MG PO CAPS
2.0000 mg | ORAL_CAPSULE | Freq: Four times a day (QID) | ORAL | Status: DC | PRN
  Administered 2015-02-09 (×3): 2 mg via ORAL
  Filled 2015-02-08 (×3): qty 1

## 2015-02-08 MED ORDER — SODIUM CHLORIDE 0.9 % IV SOLN
INTRAVENOUS | Status: DC
  Administered 2015-02-08 – 2015-02-09 (×2): via INTRAVENOUS
  Filled 2015-02-08 (×3): qty 1000

## 2015-02-08 MED ORDER — ZOLPIDEM TARTRATE 5 MG PO TABS
5.0000 mg | ORAL_TABLET | Freq: Once | ORAL | Status: AC
Start: 2015-02-08 — End: 2015-02-08
  Administered 2015-02-08: 5 mg via ORAL
  Filled 2015-02-08: qty 1

## 2015-02-08 MED ORDER — GUAIFENESIN-DM 100-10 MG/5ML PO SYRP
5.0000 mL | ORAL_SOLUTION | ORAL | Status: DC | PRN
  Administered 2015-02-08 – 2015-02-09 (×4): 5 mL via ORAL
  Filled 2015-02-08 (×5): qty 10

## 2015-02-08 MED ORDER — GUAIFENESIN-DM 100-10 MG/5ML PO SYRP: 5.0000 mL | ORAL_SOLUTION | ORAL | Status: DC | PRN

## 2015-02-08 NOTE — Progress Notes (Signed)
[facility] PROGRESS NOTE  Gina Fritz TZG:[id number] DOB: 13-Aug-1940 DOA: 02/05/2015 PCP: Stephens Shire, MD  Assessment/Plan: 1. Community-acquired pneumonia-improving, patient started on Levaquin  she has allergy to penicillin. Improving. Blood cultures are negative so far. Urinary strep pneumo antigen negative. 2. Hemoptysis- likely due to above. But patient had workup done in the past for lung mass. Will request pulmonary consultation for further evaluation. 3. Abdominal pain- improved, likely from colitis versus right ureteral stone. Continue pain medications, IV antibiotics. Follow urine culture results. Abdominal ultrasound does not show cholecystitis or any other biliary pathology. Called and discussed with Dr. Dalbert Batman who recommended getting HIDA scan. We obtained HIDA scan , which was negative for cholecystitis.  4. Diarrhea- stool for C. difficile PCR was obtained this morning which was negative for C. difficile. We'll start Imodium 2 mg every 6 hours when necessary for diarrhea. 5. Hypokalemia- replace potassium and check BMP in a.m. 6. ? Intermittent vaginal bleeding- patient is supposed to follow with GYN as outpatient,  obtained pelvic ultrasound which was negative for significant abnormal ready. 7. Colitis- CT abdomen pelvis revealed inflammation in the ascending colon, continue Flagyl and Levaquin. 8. DVT prophylaxis- Lovenox discontinued in the setting of hemoptysis, start bilateral SCDs  Code Status: DNR Family Communication: Discussed with patient's husband at bedside Disposition Plan: Home when medically stable   Consultants:  None  Procedures:  Abdominal ultrasound  Antibiotics:  Levaquin  Flagyl   HPI/Subjective: 74 year old female came to the hospital with worsening abdominal pain, recently saw urologist underwent cystoscopy at that time was noted to have blood in the urinary bladder along with 3 mm kidney stone in the right kidney which did not  require intervention. In the ED CT chest abdomen pelvis was done which showed right sided colitis, right middle and lower lobe pneumonia, right ureter inflammation and 3 mm stone at the uterocervical junction. Patient started on Levaquin and Flagyl. Abdominal ultrasound was done which did not show any significant abnormality. HIDA scan was done which was negative for cholecystitis, pelvic ultrasound showed no significant abnormality. Patient is slowly improving but continues to have cough with hemoptysis.  This morning patient denies coughing, no hemoptysis overnight. Had 4 loose bowel movements  Objective: Filed Vitals:   02/08/15 1427  BP: 127/60  Pulse: 81  Temp: 98.8 F (37.1 C)  Resp: 20    Intake/Output Summary (Last 24 hours) at 02/08/15 1507 Last data filed at 02/08/15 1428  Gross per 24 hour  Intake   2250 ml  Output   1000 ml  Net   1250 ml   Filed Weights   02/05/15 1908  Weight: 47.174 kg (104 lb)    Exam:   General:  Appears in no acute distress  Cardiovascular: S1-S2 regular  Respiratory: Clear to auscultation bilaterally  Abdomen: Soft, mild tenderness in the right upper quadrant, no organomegaly  Musculoskeletal: No cyanosis/clubbing/edema of the lower extremities   Data Reviewed: Basic Metabolic Panel:  Recent Labs Lab 02/05/15 1324 02/06/15 0422 02/08/15 0508  NA 136 140 141  K 4.8 4.1 3.1*  CL 99* 105 107  CO2 28 25 25   GLUCOSE 117* 70 124*  BUN 18 12 5*  CREATININE 1.30* 0.85 0.59  CALCIUM 10.1 9.1 8.8*   Liver Function Tests:  Recent Labs Lab 02/05/15 1324 02/06/15 0422  AST 66* 36  ALT 55* 37  ALKPHOS 139* 112  BILITOT 1.3* 0.5  PROT 7.4 6.0*  ALBUMIN 3.8 3.0*    Recent Labs Lab  02/05/15 1324  LIPASE 23   No results for input(s): AMMONIA in the last 168 hours. CBC:  Recent Labs Lab 02/05/15 1324 2015/03/01 0422 02/08/15 0508  WBC 12.4* 7.9 5.3  HGB 11.6* 10.3* 10.1*  HCT 36.2 32.2* 32.0*  MCV 84.6 85.6 85.6   PLT 285 239 235    CBG: No results for input(s): GLUCAP in the last 168 hours.  Recent Results (from the past 240 hour(s))  Urine culture     Status: None   Collection Time: 02/05/15  1:39 PM  Result Value Ref Range Status   Specimen Description URINE, CLEAN CATCH  Final   Special Requests NONE  Final   Culture   Final    60,000 COLONIES/ml DIPHTHEROIDS(CORYNEBACTERIUM SPECIES) Performed at [facility]    Report Status 02/07/2015 FINAL  Final  C difficile quick scan w PCR reflex     Status: None   Collection Time: 02/08/15  7:00 AM  Result Value Ref Range Status   C Diff antigen NEGATIVE NEGATIVE Final   C Diff toxin NEGATIVE NEGATIVE Final   C Diff interpretation Negative for toxigenic C. difficile  Final     Studies: US Transvaginal Non-ob  March 01, 2015  CLINICAL DATA:  Vaginal bleeding for 3 weeks EXAM: TRANSABDOMINAL AND TRANSVAGINAL ULTRASOUND OF PELVIS TECHNIQUE: Both transabdominal and transvaginal ultrasound examinations of the pelvis were performed. Transabdominal technique was performed for global imaging of the pelvis including uterus, ovaries, adnexal regions, and pelvic cul-de-sac. It was necessary to proceed with endovaginal exam following the transabdominal exam to visualize the endometrium. COMPARISON:  CT 02/05/2015 FINDINGS: Uterus Measurements: 5.9 x 3.0 x 3.8 cm. No fibroids or other mass visualized. Endometrium Thickness: 2 mm.  No focal abnormality visualized. Right ovary Measurements: Not visualized. No adnexal masses seen. Left ovary Measurements: Not visualized. No adnexal masses seen Other findings No free fluid. IMPRESSION: No acute findings. Ovaries cannot be visualized due to overlying bowel gas. Electronically Signed   By: Rolm Baptise M.D.   On: 03/01/2015 16:40   US Pelvis Complete  March 01, 2015  CLINICAL DATA:  Vaginal bleeding for 3 weeks EXAM: TRANSABDOMINAL AND TRANSVAGINAL ULTRASOUND OF PELVIS TECHNIQUE: Both transabdominal and transvaginal  ultrasound examinations of the pelvis were performed. Transabdominal technique was performed for global imaging of the pelvis including uterus, ovaries, adnexal regions, and pelvic cul-de-sac. It was necessary to proceed with endovaginal exam following the transabdominal exam to visualize the endometrium. COMPARISON:  CT 02/05/2015 FINDINGS: Uterus Measurements: 5.9 x 3.0 x 3.8 cm. No fibroids or other mass visualized. Endometrium Thickness: 2 mm.  No focal abnormality visualized. Right ovary Measurements: Not visualized. No adnexal masses seen. Left ovary Measurements: Not visualized. No adnexal masses seen Other findings No free fluid. IMPRESSION: No acute findings. Ovaries cannot be visualized due to overlying bowel gas. Electronically Signed   By: Rolm Baptise M.D.   On: March 01, 2015 16:40    Scheduled Meds: . atorvastatin  40 mg Oral Daily  . darifenacin  15 mg Oral Daily  . escitalopram  20 mg Oral Daily  . feeding supplement  1 Container Oral TID BM  . feeding supplement (PRO-STAT SUGAR FREE 64)  30 mL Oral TID BM  . hydrALAZINE  25 mg Oral BID  . levofloxacin (LEVAQUIN) IV  750 mg Intravenous Q48H  . metronidazole  500 mg Intravenous Q8H  . montelukast  10 mg Oral Daily  . multivitamin with minerals  1 tablet Oral Daily  . tamsulosin  0.4 mg Oral QPC  breakfast   Continuous Infusions: . sodium chloride 100 mL/hr at 02/08/15 9892    Active Problems:   Abdominal pain   Pneumonia   CAP (community acquired pneumonia)   Kidney stone on right side   Colitis    Time spent: 25 min    [facility] Pager [phone]. If 7PM-7AM, please contact night-coverage at [email or web address], password [email or web address] 02/08/2015, 3:07 PM  LOS: 3 days

## 2015-02-08 NOTE — Care Management Important Message (Signed)
Important Message  Patient Details  Name: Gina Fritz MRN: [id number] Date of Birth: Oct 12, 1940   Medicare Important Message Given:  Yes-second notification given    Apolonio Schneiders, RN 02/08/2015, 10:19 AM

## 2015-02-08 NOTE — Progress Notes (Signed)
ANTIBIOTIC CONSULT NOTE - follow-up  Pharmacy Consult for Levofloxacin Indication: CAP, IAI, possible UTI  Allergies  Allergen Reactions  . Penicillins Anaphylaxis    Has patient had a PCN reaction causing immediate rash, facial/tongue/throat swelling, SOB or lightheadedness with hypotension: Yes Has patient had a PCN reaction causing severe rash involving mucus membranes or skin necrosis: Yes Has patient had a PCN reaction that required hospitalization Yes Has patient had a PCN reaction occurring within the last 10 years: No If all of the above answers are "NO", then may proceed with Cephalosporin use.   . Soy Allergy Itching and Rash  . Sulfonamide Derivatives Rash    Patient Measurements: Height: 5\' 2"  (157.5 cm) Weight: 104 lb (47.174 kg) IBW/kg (Calculated) : 50.1  Vital Signs: Temp: 98.2 F (36.8 C) (11/06 0649) Temp Source: Oral (11/06 0649) BP: 162/78 mmHg (11/06 0906) Pulse Rate: 79 (11/06 0906) Intake/Output from previous day: 11/05 0701 - 11/06 0700 In: 2670 [P.O.:720; I.V.:1600; IV Piggyback:350] Out: 900 [Urine:900] Intake/Output from this shift:    Labs:  Recent Labs  02/06/15 0422 02/08/15 0508  WBC 7.9 5.3  HGB 10.3* 10.1*  PLT 239 235  CREATININE 0.85 0.59   Estimated Creatinine Clearance: 46.7 mL/min (by C-G formula based on Cr of 0.59). No results for input(s): VANCOTROUGH, VANCOPEAK, VANCORANDOM, GENTTROUGH, GENTPEAK, GENTRANDOM, TOBRATROUGH, TOBRAPEAK, TOBRARND, AMIKACINPEAK, AMIKACINTROU, AMIKACIN in the last 72 hours.   Microbiology: Recent Results (from the past 720 hour(s))  Urine culture     Status: None   Collection Time: 02/05/15  1:39 PM  Result Value Ref Range Status   Specimen Description URINE, CLEAN CATCH  Final   Special Requests NONE  Final   Culture   Final    60,000 COLONIES/ml DIPHTHEROIDS(CORYNEBACTERIUM SPECIES) Performed at [facility]    Report Status 02/07/2015 FINAL  Final  C difficile quick scan w PCR  reflex     Status: None   Collection Time: 02/08/15  7:00 AM  Result Value Ref Range Status   C Diff antigen NEGATIVE NEGATIVE Final   C Diff toxin NEGATIVE NEGATIVE Final   C Diff interpretation Negative for toxigenic C. difficile  Final   Anti-infectives    Start     Dose/Rate Route Frequency Ordered Stop   02/07/15 1600  levofloxacin (LEVAQUIN) IVPB 750 mg     750 mg 100 mL/hr over 90 Minutes Intravenous Every 48 hours 02/05/15 2013     02/05/15 2000  metroNIDAZOLE (FLAGYL) IVPB 500 mg     500 mg 100 mL/hr over 60 Minutes Intravenous Every 8 hours 02/05/15 1928     02/05/15 1630  levofloxacin (LEVAQUIN) tablet 500 mg     500 mg Oral  Once 02/05/15 1626 02/05/15 1644   02/05/15 0000  levofloxacin (LEVAQUIN) 500 MG tablet     500 mg Oral Daily 02/05/15 1706       Assessment: 74 y.o. female presents with worsening abdominal and back pain x 2 weeks.  Recently seen for hematuria and noted to have nephrolithiasis.  Since then has developed productive cough and worsening abdominal pain with low-grade fevers.  Hematuria has continued.  CXR shows RML and RLL infiltrate, CT chest + pelvis which showed distended gallbladder with mildly dilated intrahepatic ducts, enhancement of right degenerative cyst with infection/inflammation, obstructing right ureteral stone measuring 3 mm at ureterovesical junction, enhancement of mucosa of the ascending colonic wall consistent with colitis.  Noted with anaphylaxis to PCN; no other administered beta-lactams in Epic.  Pharmacy  to dose Levaquin with Flagyl per MD to cover CAP, intra-abdominal infection, and possible UTI  11/3 >> Levofloxacin >> 11/3 >> Metronidazole >>    11/3 urine: 60K diptheroids 11/3 S. pneumo UAg: negative 11/3 Legionella UAg: negative C. Diff: -/-  Today, 02/08/2015: Afebrile WBC decreased to WNL Renal: AKI - resolved, CrCl remains < 47ml/min  Goal of Therapy:  Eradication of infection Appropriate antibiotic dosing for  indication and renal function  Plan:  Day 4 antibiotics for PNA, colitis, ? UTI (Diptheroids likely colonization)  Levaquin 750 mg IV q48 hr  Flagyl per MD  Follow clinical course, renal function, culture results as available  Follow for de-escalation of antibiotics and LOT  Doreene Eland, PharmD, BCPS.   Pager: [phone] 02/08/2015 1:34 PM

## 2015-02-08 NOTE — Progress Notes (Signed)
Follow-up re AD. Pt was lying down when I arrived; said she had just gotten a shot for pain. Will refer to daytime Chaplain. Please page if needed prior to that time. Chaplain Marlise Eves Holder   02/08/15 1800  Clinical Encounter Type  Visited With Patient

## 2015-02-09 ENCOUNTER — Inpatient Hospital Stay (HOSPITAL_COMMUNITY): Payer: Medicare Other

## 2015-02-09 DIAGNOSIS — R042 Hemoptysis: Secondary | ICD-10-CM | POA: Insufficient documentation

## 2015-02-09 DIAGNOSIS — E440 Moderate protein-calorie malnutrition: Secondary | ICD-10-CM | POA: Insufficient documentation

## 2015-02-09 LAB — COMPREHENSIVE METABOLIC PANEL
ALBUMIN: 2.7 g/dL — AB (ref 3.5–5.0)
ALK PHOS: 132 U/L — AB (ref 38–126)
ALT: 29 U/L (ref 14–54)
ANION GAP: 7 (ref 5–15)
AST: 37 U/L (ref 15–41)
BILIRUBIN TOTAL: 0.5 mg/dL (ref 0.3–1.2)
CALCIUM: 8.8 mg/dL — AB (ref 8.9–10.3)
CO2: 24 mmol/L (ref 22–32)
Chloride: 106 mmol/L (ref 101–111)
Creatinine, Ser: 0.72 mg/dL (ref 0.44–1.00)
GFR calc Af Amer: >60 mL/min (ref >60)
GLUCOSE: 116 mg/dL — AB (ref 65–99)
Potassium: 4.2 mmol/L (ref 3.5–5.1)
Sodium: 137 mmol/L (ref 135–145)
TOTAL PROTEIN: 5.7 g/dL — AB (ref 6.5–8.1)

## 2015-02-09 LAB — CBC
HEMATOCRIT: 30.7 % — AB (ref 36.0–46.0)
Hemoglobin: 9.7 g/dL — ABNORMAL LOW (ref 12.0–15.0)
MCH: 26.9 pg (ref 26.0–34.0)
MCHC: 31.6 g/dL (ref 30.0–36.0)
MCV: 85.3 fL (ref 78.0–100.0)
Platelets: 248 10*3/uL (ref 150–400)
RBC: 3.6 MIL/uL — ABNORMAL LOW (ref 3.87–5.11)
RDW: 14.9 % (ref 11.5–15.5)
WBC: 5.6 10*3/uL (ref 4.0–10.5)

## 2015-02-09 LAB — GLUCOSE, CAPILLARY: GLUCOSE-CAPILLARY: 112 mg/dL — AB (ref 65–99)

## 2015-02-09 MED ORDER — HYDROCHLOROTHIAZIDE 25 MG PO TABS
25.0000 mg | ORAL_TABLET | Freq: Every day | ORAL | Status: DC
  Administered 2015-02-09 – 2015-02-10 (×2): 25 mg via ORAL
  Filled 2015-02-09 (×2): qty 1

## 2015-02-09 MED ORDER — SODIUM CHLORIDE 0.9 % IV SOLN: INTRAVENOUS | Status: DC

## 2015-02-09 MED ORDER — POTASSIUM CHLORIDE CRYS ER 20 MEQ PO TBCR
20.0000 meq | EXTENDED_RELEASE_TABLET | Freq: Every day | ORAL | Status: DC
  Administered 2015-02-09 – 2015-02-10 (×2): 20 meq via ORAL
  Filled 2015-02-09 (×2): qty 1

## 2015-02-09 MED ORDER — ZOLPIDEM TARTRATE 5 MG PO TABS
5.0000 mg | ORAL_TABLET | Freq: Once | ORAL | Status: AC
  Administered 2015-02-10: 5 mg via ORAL
  Filled 2015-02-09: qty 1

## 2015-02-09 MED ORDER — LOSARTAN POTASSIUM 50 MG PO TABS
50.0000 mg | ORAL_TABLET | Freq: Two times a day (BID) | ORAL | Status: DC
  Administered 2015-02-09 – 2015-02-10 (×3): 50 mg via ORAL
  Filled 2015-02-09 (×3): qty 1

## 2015-02-09 MED ORDER — SODIUM CHLORIDE 0.9 % IV SOLN
INTRAVENOUS | Status: DC
  Administered 2015-02-09: 13:00:00 via INTRAVENOUS

## 2015-02-09 NOTE — Clinical Social Work Placement (Signed)
   CLINICAL SOCIAL WORK PLACEMENT  NOTE  Date:  02/09/2015  Patient Details  Name: Gina Fritz MRN: [id number] Date of Birth: 22-May-1940  Clinical Social Work is seeking post-discharge placement for this patient at the [place] level of care (*CSW will initial, date and re-position this form in  chart as items are completed):  Yes   Patient/family provided with [facility] Work Department's list of facilities offering this level of care within the geographic area requested by the patient (or if unable, by the patient's family).  Yes   Patient/family informed of their freedom to choose among providers that offer the needed level of care, that participate in Medicare, Medicaid or managed care program needed by the patient, have an available bed and are willing to accept the patient.  Yes   Patient/family informed of [facility]'s ownership interest in [facility] and [facility], as well as of the fact that they are under no obligation to receive care at these facilities.  PASRR submitted to EDS on 02/09/15     PASRR number received on 02/09/15     Existing PASRR number confirmed on       FL2 transmitted to all facilities in geographic area requested by pt/family on 02/09/15     FL2 transmitted to all facilities within larger geographic area on       Patient informed that his/her managed care company has contracts with or will negotiate with certain facilities, including the following:            Patient/family informed of bed offers received.  Patient chooses bed at       Physician recommends and patient chooses bed at      Patient to be transferred to   on  .  Patient to be transferred to facility by       Patient family notified on   of transfer.  Name of family member notified:        PHYSICIAN Please prepare priority discharge summary, including medications, Please sign FL2, Please prepare prescriptions, Please sign DNR      Additional Comment:    _______________________________________________ Ludwig Clarks, LCSW 02/09/2015, 3:25 PM

## 2015-02-09 NOTE — Clinical Social Work Note (Deleted)
Clinical Social Work Assessment  Patient Details  Name: Gina Fritz MRN: [id number] Date of Birth: 1940/10/09  Date of referral:  02/09/15               Reason for consult:  Facility Placement                Permission sought to share information with:  Family Supports Permission granted to share information::  Yes, Verbal Permission Granted  Name::        Agency::     Relationship::     Contact Information:     Housing/Transportation Living arrangements for the past 2 months:  Single Family Home Source of Information:  Patient Patient Interpreter Needed:  None Criminal Activity/Legal Involvement Pertinent to Current Situation/Hospitalization:  No - Comment as needed Significant Relationships:  Adult Children, Warehouse manager, Spouse Lives with:  Spouse Do you feel safe going back to the place where you live?  No Need for family participation in patient care:  Yes (Comment)  Care giving concerns:  Patient is concerned about going home- states,  "I became worried when the doctor said something about sending me home today". She would like to be considered for SNF rehab stay. Her husband works part time and she is weak- have asked for PT eval.    Social Worker assessment / plan:  CSW met with patient who requests and agrees to [employer]. CSW will complete FL2 and PASARR_-  Employment status:  Retired Forensic scientist:  Medicare PT Recommendations:  [place] / Referral to community resources:  [place]  Patient/Family's Response to care:  Patient requesting SNF placement for rehab prior to returning home-   Patient/Family's Understanding of and Emotional Response to Diagnosis, Current Treatment, and Prognosis:  CSW was asked to speak with member after she was told by MD she may be released to home today- "I am not able to go home yet". CSW discussed possible SNF with her and have asked for a PT evaluation for disposition  recommendations.  Emotional Assessment Appearance:  Developmentally appropriate Attitude/Demeanor/Rapport:    Affect (typically observed):  Accepting, Appropriate Orientation:  Oriented to Self, Oriented to Place, Oriented to  Time, Oriented to Situation Alcohol / Substance use:  Not Applicable Psych involvement (Current and /or in the community):  No (Comment)  Discharge Needs  Concerns to be addressed:  Discharge Planning Concerns Readmission within the last 30 days:  No Current discharge risk:  Physical Impairment Barriers to Discharge:  No Barriers Identified   Ludwig Clarks, LCSW 02/09/2015, 2:12 PM

## 2015-02-09 NOTE — Progress Notes (Signed)
[facility] PROGRESS NOTE  Gina Fritz IOE:[id number] DOB: 1940-09-06 DOA: 02/05/2015 PCP: Stephens Shire, MD  Assessment/Plan: 1. Community-acquired pneumonia-improving, patient started on Levaquin  she has allergy to penicillin. Improving. Blood cultures are negative so far. Urinary strep pneumo antigen negative. Pulmonary is following, will see the patient as outpatient. 2. Hemoptysis- likely due to above, resolved, patient had workup done in the past for lung mass. Pulmonary following. 3. Abdominal pain- improved, likely from colitis versus right ureteral stone. Continue pain medications, IV antibiotics.  urine culture resulted diphtheroids, contamination. Abdominal ultrasound does not show cholecystitis or any other biliary pathology. Called and discussed with Dr. Dalbert Batman who recommended getting HIDA scan. We obtained HIDA scan , which was negative for cholecystitis.  4. Diarrhea- stool for C. difficile PCR was obtained this morning which was negative for C. difficile.  Started Imodium 2 mg every 6 hours when necessary for diarrhea. 5. Hypokalemia- replaced potassium. 6. ? Intermittent vaginal bleeding- patient is supposed to follow with GYN as outpatient,  obtained pelvic ultrasound which was negative for significant abnormality. 7. Colitis- CT abdomen pelvis revealed inflammation in the ascending colon, continue Flagyl and Levaquin. 8. Hypertension- BP is now elevated, will retstart the HCTZ and Losartan. 9. DVT prophylaxis- Lovenox discontinued in the setting of hemoptysis, start bilateral SCDs  Code Status: DNR Family Communication: Discussed with patient's husband at bedside Disposition Plan: Home when medically stable   Consultants:  None  Procedures:  Abdominal ultrasound  Antibiotics:  Levaquin  Flagyl   HPI/Subjective: 74 year old female came to the hospital with worsening abdominal pain, recently saw urologist underwent cystoscopy at that time was noted to  have blood in the urinary bladder along with 3 mm kidney stone in the right kidney which did not require intervention. In the ED CT chest abdomen pelvis was done which showed right sided colitis, right middle and lower lobe pneumonia, right ureter inflammation and 3 mm stone at the uterocervical junction.Patient started on Levaquin and Flagyl. Abdominal ultrasound was done which did not show any significant abnormality. HIDA scan was done which was negative for cholecystitis, pelvic ultrasound showed no significant abnormality. Patient is slowly improving but continues to have cough with hemoptysis.  This morning patient denies coughing up blood, diarrhea has resolved. Stool for C diff PCR is negative. Started on Imodium prn for diarrhea.  Objective: Filed Vitals:   02/09/15 0839  BP: 182/83  Pulse:   Temp:   Resp:     Intake/Output Summary (Last 24 hours) at 02/09/15 1043 Last data filed at 02/09/15 1037  Gross per 24 hour  Intake 2251.67 ml  Output   1250 ml  Net 1001.67 ml   Filed Weights   02/05/15 1908  Weight: 47.174 kg (104 lb)    Exam:   General:  Appears in no acute distress  Cardiovascular: S1-S2 regular  Respiratory: Clear to auscultation bilaterally  Abdomen: Soft, mild tenderness in the right upper quadrant, no organomegaly  Musculoskeletal: No cyanosis/clubbing/edema of the lower extremities   Data Reviewed: Basic Metabolic Panel:  Recent Labs Lab 02/05/15 1324 02/06/15 0422 02/08/15 0508 02/09/15 0455  NA 136 140 141 137  K 4.8 4.1 3.1* 4.2  CL 99* 105 107 106  CO2 28 25 25 24   GLUCOSE 117* 70 124* 116*  BUN 18 12 5* <5*  CREATININE 1.30* 0.85 0.59 0.72  CALCIUM 10.1 9.1 8.8* 8.8*   Liver Function Tests:  Recent Labs Lab 02/05/15 1324 02/06/15 0422 02/09/15 0455  AST 66* 36  37  ALT 55* 37 29  ALKPHOS 139* 112 132*  BILITOT 1.3* 0.5 0.5  PROT 7.4 6.0* 5.7*  ALBUMIN 3.8 3.0* 2.7*    Recent Labs Lab 02/05/15 1324  LIPASE 23    No results for input(s): AMMONIA in the last 168 hours. CBC:  Recent Labs Lab 02/05/15 1324 02/06/15 0422 02/08/15 0508 02/09/15 0455  WBC 12.4* 7.9 5.3 5.6  HGB 11.6* 10.3* 10.1* 9.7*  HCT 36.2 32.2* 32.0* 30.7*  MCV 84.6 85.6 85.6 85.3  PLT 285 239 235 248    CBG:  Recent Labs Lab 02/09/15 0640  GLUCAP 112*    Recent Results (from the past 240 hour(s))  Urine culture     Status: None   Collection Time: 02/05/15  1:39 PM  Result Value Ref Range Status   Specimen Description URINE, CLEAN CATCH  Final   Special Requests NONE  Final   Culture   Final    60,000 COLONIES/ml DIPHTHEROIDS(CORYNEBACTERIUM SPECIES) Performed at [facility]    Report Status 02/07/2015 FINAL  Final  C difficile quick scan w PCR reflex     Status: None   Collection Time: 02/08/15  7:00 AM  Result Value Ref Range Status   C Diff antigen NEGATIVE NEGATIVE Final   C Diff toxin NEGATIVE NEGATIVE Final   C Diff interpretation Negative for toxigenic C. difficile  Final     Studies: Dg Chest 2 View  02/09/2015  CLINICAL DATA:  Community-acquired pneumonia, abdominal pain, history of right-sided kidney stones. EXAM: CHEST  2 VIEW COMPARISON:  CT scan of the abdomen and pelvis which included the lower chest dated February 05, 2015, and PA and lateral chest x-ray of the same day. FINDINGS: The lungs remain hyperinflated. Confluent alveolar opacity is developed in the right middle and lower lobes. The left lung is clear. A trace of pleural fluid blunts the posterior costophrenic angle on the left. The heart and pulmonary vascularity are normal. The mediastinum is normal in width. There is gentle levocurvature of the thoracolumbar spine. IMPRESSION: Progressive interstitial and alveolar opacity in the right middle and lower lobes consistent with pneumonia. There is underlying COPD. A tiny pleural effusion has developed on the left. Electronically Signed   By: David  Martinique M.D.   On: 02/09/2015 09:22     Scheduled Meds: . atorvastatin  40 mg Oral Daily  . darifenacin  15 mg Oral Daily  . escitalopram  20 mg Oral Daily  . feeding supplement  1 Container Oral TID BM  . feeding supplement (PRO-STAT SUGAR FREE 64)  30 mL Oral TID BM  . hydrALAZINE  25 mg Oral BID  . hydrochlorothiazide  25 mg Oral Daily  . levofloxacin (LEVAQUIN) IV  750 mg Intravenous Q48H  . losartan  50 mg Oral BID  . metronidazole  500 mg Intravenous Q8H  . montelukast  10 mg Oral Daily  . multivitamin with minerals  1 tablet Oral Daily  . potassium chloride  20 mEq Oral Daily  . tamsulosin  0.4 mg Oral QPC breakfast   Continuous Infusions: . sodium chloride 0.9 % 1,000 mL with potassium chloride 20 mEq infusion 100 mL/hr at 02/09/15 0219    Active Problems:   Abdominal pain   Pneumonia   CAP (community acquired pneumonia)   Kidney stone on right side   Colitis    Time spent: 25 min    [facility] Pager [phone]. If 7PM-7AM, please contact night-coverage at [email or web address], password  [email or web address] 02/09/2015, 10:43 AM  LOS: 4 days

## 2015-02-09 NOTE — Plan of Care (Signed)
Problem: Education: Goal: Knowledge of disease or condition will improve Outcome: Progressing Encouraged patient to increase fluid intake to thin out secretions.

## 2015-02-09 NOTE — Progress Notes (Signed)
   02/09/15 1500  Clinical Encounter Type  Visited With Patient  Visit Type Social support;Spiritual support;Psychological support (Advance Directive)  Referral From Chaplain  Consult/Referral To Nurse  Recommendations Follow up   Spiritual Encounters  Spiritual Needs Grief support;Emotional  Stress Factors  Patient Stress Factors Health changes;Major life changes    Chaplain referred for Advance Directive.  Aaliyan explained that she had not had a moment to look at paperwork and would like chaplain to follow up for AD tomorrow.   Chaplain provided support at bedside around health changes, changes in lifestyle and responsibilities.  Daliah reported having "been the one that takes care of everything" and that she is feeling overwhelmed and wrestling with "having to let things go."  Spoke with chaplain about the grief process around this change in identity - describing this as helpful, as she had not labeled this as grief.    [place], [place]

## 2015-02-09 NOTE — Progress Notes (Signed)
PCCM PROGRESS NOTE  ADMISSION DATE: 02/05/2015 CONSULT DATE: 02/07/2015 REFERRING PROVIDER: Darrick Meigs  CC: Hemoptysis  HPI: 74 yo female smoker presented with abdominal pain for about 2 weeks duration.  She had recent evaluation by urology for hematuria and possible vaginal bleeding.  She also developed a productive cough and low grade fever.  She was admitted by hospitalist service.  She was found to have PNA and colitis.  She was started on Abx.  She was noted to have some hemoptysis.  PCCM was asked to assess.   SUBJECTIVE: Feels better  OBJECTIVE: BP 182/83 mmHg  Pulse 83  Temp(Src) 98.4 F (36.9 C) (Oral)  Resp 21  Ht 5\' 2"  (1.575 m)  Wt 47.174 kg (104 lb)  BMI 19.02 kg/m2  SpO2 96% Room air  I/O last 3 completed shifts: In: 3780 [P.O.:60; I.V.:2970; IV Piggyback:750] Out: 1750 [Urine:1750]  General: thin HEENT: no sinus tenderness, pupils reactive, raspy/hoarse voice Cardiac: regular, no murmur Chest: clear w/out accessory muscle use  Abd: soft, non tener Ext: no edema Neuro: normal strength, CN intact Skin: no rashes   CMP Latest Ref Rng 02/09/2015 02/08/2015 02/06/2015  Glucose 65 - 99 mg/dL 116(H) 124(H) 70  BUN 6 - 20 mg/dL <5(L) 5(L) 12  Creatinine 0.44 - 1.00 mg/dL 0.72 0.59 0.85  Sodium 135 - 145 mmol/L 137 141 140  Potassium 3.5 - 5.1 mmol/L 4.2 3.1(L) 4.1  Chloride 101 - 111 mmol/L 106 107 105  CO2 22 - 32 mmol/L 24 25 25   Calcium 8.9 - 10.3 mg/dL 8.8(L) 8.8(L) 9.1  Total Protein 6.5 - 8.1 g/dL 5.7(L) - 6.0(L)  Total Bilirubin 0.3 - 1.2 mg/dL 0.5 - 0.5  Alkaline Phos 38 - 126 U/L 132(H) - 112  AST 15 - 41 U/L 37 - 36  ALT 14 - 54 U/L 29 - 37     CBC Latest Ref Rng 02/09/2015 02/08/2015 02/06/2015  WBC 4.0 - 10.5 K/uL 5.6 5.3 7.9  Hemoglobin 12.0 - 15.0 g/dL 9.7(L) 10.1(L) 10.3(L)  Hematocrit 36.0 - 46.0 % 30.7(L) 32.0(L) 32.2(L)  Platelets 150 - 400 K/uL 248 235 239     Dg Chest 2 View  02/09/2015  CLINICAL DATA:  Community-acquired pneumonia,  abdominal pain, history of right-sided kidney stones. EXAM: CHEST  2 VIEW COMPARISON:  CT scan of the abdomen and pelvis which included the lower chest dated February 05, 2015, and PA and lateral chest x-ray of the same day. FINDINGS: The lungs remain hyperinflated. Confluent alveolar opacity is developed in the right middle and lower lobes. The left lung is clear. A trace of pleural fluid blunts the posterior costophrenic angle on the left. The heart and pulmonary vascularity are normal. The mediastinum is normal in width. There is gentle levocurvature of the thoracolumbar spine. IMPRESSION: Progressive interstitial and alveolar opacity in the right middle and lower lobes consistent with pneumonia. There is underlying COPD. A tiny pleural effusion has developed on the left. Electronically Signed   By: David  Martinique M.D.   On: 02/09/2015 09:22     CULTURES: Urine 11/03 >> Pneumococcal Ag 11/03 >> negative Legionella Ag 11/03 >>   ANTIBIOTICS: Levaquin 11/03 >> Flagyl 11/03 >>  STUDIES: 11/03 CT chest >> centrilobular emphysema, RML/RLL infiltrate 11/03 CT abd/pelvis >> mild GB distention, mild Rt hydroureter with 3 mm stone at ureterovesical junction, mild thickening of ascending colon and transverse colon 11/03 Abd u/s >> sludge in GB 11/04 HIDA scan >> normal  11/04 Pelvic/vaginal u/s >> no acute findings  EVENTS: 11/03 Admit  DISCUSSION: 74 yo female smoker with CAP and hemoptysis.  CT chest findings consistent with emphysema also.  No definite mass on CT chest.  She has stable nodules since 2006 >> benign.  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 Plan: - smoking cessation - will need PFTs to assess for COPD as outpt when more stable - prn albuterol -  bronchial hygiene  Goals of care >> DNR.  Erick Colace ACNP-BC [place] Pager # [phone] OR # [phone] if no answer

## 2015-02-09 NOTE — NC FL2 (Signed)
[place] LEVEL OF CARE SCREENING TOOL     IDENTIFICATION  Patient Name: Gina Fritz Birthdate: 09-12-1940 Sex: female Admission Date (Current Location): 02/05/2015  [facility] [place] Number: Psychologist, prison and probation services and Address:  [facility],  [place] [address] [place]      Provider Number: [id number]  Attending Physician Name and Address:  Oswald Hillock, MD  Relative Name and Phone Number:       Current Level of Care: Hospital Recommended Level of Care: [place] Prior Approval Number:    Date Approved/Denied:   PASRR Number: [id number]  Discharge Plan: SNF    Current Diagnoses: Patient Active Problem List   Diagnosis Date Noted  . Malnutrition of moderate degree 02/09/2015  . Hemoptysis   . Abdominal pain 02/05/2015  . Pneumonia 02/05/2015  . CAP (community acquired pneumonia) 02/05/2015  . Kidney stone on right side 02/05/2015  . Colitis 02/05/2015  . Dizziness and giddiness 04/22/2013  . Abnormality of gait 04/22/2013  . Dizziness     Orientation ACTIVITIES/SOCIAL BLADDER RESPIRATION    Self, Time, Situation  Active Continent O2 (As needed) (2 l)  BEHAVIORAL SYMPTOMS/MOOD NEUROLOGICAL BOWEL NUTRITION STATUS      Continent    PHYSICIAN VISITS COMMUNICATION OF NEEDS Height & Weight Skin  Over 180 days Verbally _0  (157.5 cm) 104 lbs. Normal          AMBULATORY STATUS RESPIRATION      O2 (As needed) (2 l)      Personal Care Assistance Level of Assistance  Bathing, Dressing            Functional Limitations Info                SPECIAL CARE FACTORS FREQUENCY  PT (By licensed PT), OT (By licensed OT)     PT Frequency: 5 OT Frequency: 5           Additional Factors Info  Code Status Code Status Info: DNR             Current Medications (02/09/2015): Current Facility-Administered Medications  Medication Dose Route Frequency Provider Last Rate Last Dose  . 0.9 %  sodium  chloride infusion   Intravenous Continuous Oswald Hillock, MD 10 mL/hr at 02/09/15 1329    . acetaminophen (TYLENOL) tablet 650 mg  650 mg Oral Q6H PRN Oswald Hillock, MD   650 mg at 02/09/15 6144   Or  . acetaminophen (TYLENOL) suppository 650 mg  650 mg Rectal Q6H PRN Oswald Hillock, MD      . albuterol (PROVENTIL) (2.5 MG/3ML) 0.083% nebulizer solution 2.5 mg  2.5 mg Nebulization Q2H PRN Chesley Mires, MD      . atorvastatin (LIPITOR) tablet 40 mg  40 mg Oral Daily Oswald Hillock, MD   40 mg at 02/09/15 0837  . darifenacin (ENABLEX) 24 hr tablet 15 mg  15 mg Oral Daily Oswald Hillock, MD   15 mg at 02/09/15 1033  . escitalopram (LEXAPRO) tablet 20 mg  20 mg Oral Daily Oswald Hillock, MD   20 mg at 02/09/15 0837  . feeding supplement (BOOST / RESOURCE BREEZE) liquid 1 Container  1 Container Oral TID BM Rosezetta Schlatter, RD   1 Container at 02/09/15 1400  . feeding supplement (PRO-STAT SUGAR FREE 64) liquid 30 mL  30 mL Oral TID BM Maricela Bo Ostheim, RD      . guaiFENesin-dextromethorphan (ROBITUSSIN DM) 100-10  MG/5ML syrup 5 mL  5 mL Oral Q4H PRN Oswald Hillock, MD   5 mL at 02/09/15 0831  . hydrALAZINE (APRESOLINE) tablet 25 mg  25 mg Oral BID Oswald Hillock, MD   25 mg at 02/09/15 0839  . hydrochlorothiazide (HYDRODIURIL) tablet 25 mg  25 mg Oral Daily Oswald Hillock, MD   25 mg at 02/09/15 4098  . HYDROmorphone (DILAUDID) injection 1 mg  1 mg Intravenous Q4H PRN Oswald Hillock, MD   1 mg at 02/08/15 2310  . iohexol (OMNIPAQUE) 300 MG/ML solution 25 mL  25 mL Oral Once PRN Jola Schmidt, MD   25 mL at 02/05/15 1619  . levofloxacin (LEVAQUIN) IVPB 750 mg  750 mg Intravenous Q48H Drew A Wofford, RPH   750 mg at 02/07/15 1639  . loperamide (IMODIUM) capsule 2 mg  2 mg Oral Q6H PRN Oswald Hillock, MD   2 mg at 02/09/15 0828  . losartan (COZAAR) tablet 50 mg  50 mg Oral BID Oswald Hillock, MD   50 mg at 02/09/15 0839  . metroNIDAZOLE (FLAGYL) IVPB 500 mg  500 mg Intravenous Q8H Gardiner Barefoot, NP   500 mg at 02/09/15  1329  . montelukast (SINGULAIR) tablet 10 mg  10 mg Oral Daily Oswald Hillock, MD   10 mg at 02/09/15 0838  . multivitamin with minerals tablet 1 tablet  1 tablet Oral Daily Rosezetta Schlatter, RD   1 tablet at 02/09/15 [phone]  . ondansetron (ZOFRAN) tablet 4 mg  4 mg Oral Q6H PRN Oswald Hillock, MD       Or  . ondansetron (ZOFRAN) injection 4 mg  4 mg Intravenous Q6H PRN Oswald Hillock, MD   4 mg at 02/09/15 0329  . potassium chloride SA (K-DUR,KLOR-CON) CR tablet 20 mEq  20 mEq Oral Daily Oswald Hillock, MD   20 mEq at 02/09/15 0838  . tamsulosin (FLOMAX) capsule 0.4 mg  0.4 mg Oral QPC breakfast Oswald Hillock, MD   0.4 mg at 02/09/15 0829  . technetium TC 77M mebrofenin (CHOLETEC) injection 5 milli Curie  5 milli Curie Intravenous Once PRN Oswald Hillock, MD       Do not use this list as official medication orders. Please verify with discharge summary.  Discharge Medications:   Medication List    TAKE these medications        ibuprofen 600 MG tablet  Commonly known as:  ADVIL,MOTRIN  Take 1 tablet (600 mg total) by mouth every 8 (eight) hours as needed.     levofloxacin 500 MG tablet  Commonly known as:  LEVAQUIN  Take 1 tablet (500 mg total) by mouth daily.     oxyCODONE-acetaminophen 5-325 MG tablet  Commonly known as:  PERCOCET/ROXICET  Take 1 tablet by mouth every 4 (four) hours as needed for severe pain.      ASK your doctor about these medications        acetaminophen 500 MG tablet  Commonly known as:  TYLENOL  Take 1,000 mg by mouth every 6 (six) hours as needed for moderate pain or headache.     aspirin EC 81 MG tablet  Take 81 mg by mouth daily.     atorvastatin 40 MG tablet  Commonly known as:  LIPITOR  Take 40 mg by mouth daily.     BESIVANCE 0.6 % Susp  Generic drug:  Besifloxacin HCl  Place 1 drop into the right eye 4 (  four) times daily. The day before and the day of the dr visit     DONNATAL 16.2 MG tablet  Generic drug:  [place] alk-PHENObarbital  16.2 tablets  daily.     escitalopram 20 MG tablet  Commonly known as:  LEXAPRO     fenofibrate micronized 130 MG capsule  Commonly known as:  ANTARA  Take 130 mg by mouth daily.     fluticasone 50 MCG/ACT nasal spray  Commonly known as:  FLONASE  Place 2 sprays into the nose daily.     hydrALAZINE 25 MG tablet  Commonly known as:  APRESOLINE  Take 25 mg by mouth 2 (two) times daily.     hydrochlorothiazide 25 MG tablet  Commonly known as:  HYDRODIURIL  Take 25 mg by mouth daily.     ICAPS AREDS 2 PO  Take 1 tablet by mouth 2 (two) times daily.     loperamide 2 MG capsule  Commonly known as:  IMODIUM  Take 2-4 mg by mouth 4 (four) times daily as needed for diarrhea or loose stools. For upset stomach     losartan 50 MG tablet  Commonly known as:  COZAAR  Take 50 mg by mouth 2 (two) times daily.     meclizine 25 MG tablet  Commonly known as:  ANTIVERT  Take 25 mg by mouth daily as needed for dizziness.     mometasone 220 MCG/INH inhaler  Commonly known as:  ASMANEX  Inhale 2 puffs into the lungs 2 (two) times daily.     montelukast 10 MG tablet  Commonly known as:  SINGULAIR  Take 10 mg by mouth daily.     multivitamin with minerals Tabs tablet  Take 1 tablet by mouth daily.     naproxen sodium 220 MG tablet  Commonly known as:  ANAPROX  Take 220 mg by mouth daily. For pain     ranitidine 300 MG tablet  Commonly known as:  ZANTAC  Take 300 mg by mouth 2 (two) times daily.     solifenacin 10 MG tablet  Commonly known as:  VESICARE  Take 10 mg by mouth daily.     Vitamin D 2000 UNITS Caps  Take 2,000 Units by mouth daily.        Relevant Imaging Results:  Relevant Lab Results:  Recent Labs    Additional Information SSN [id number]  Ludwig Clarks, LCSW

## 2015-02-09 NOTE — Clinical Social Work Note (Signed)
Clinical Social Work Assessment  Patient Details  Name: Gina Fritz MRN: [id number] Date of Birth: November 25, 1940  Date of referral:  02/09/15               Reason for consult:  Facility Placement                Permission sought to share information with:  Family Supports Permission granted to share information::  Yes, Verbal Permission Granted  Name::        Agency::     Relationship::     Contact Information:     Housing/Transportation Living arrangements for the past 2 months:  Single Family Home Source of Information:  Patient Patient Interpreter Needed:  None Criminal Activity/Legal Involvement Pertinent to Current Situation/Hospitalization:  No - Comment as needed Significant Relationships:  Adult Children, Warehouse manager, Spouse Lives with:  Spouse Do you feel safe going back to the place where you live?  No Need for family participation in patient care:  Yes (Comment)  Care giving concerns:  Husband works part time- she does not feel comfortable going home yet as she feels she is weak and not safe Cabin crew.    Social Worker assessment / plan:  CSW will complete FL2 and PASARR for SNF search and proceed with SNF search while awaiting PT eval and recommendations.   Employment status:  Retired Forensic scientist:  Medicare PT Recommendations:  [place] / Referral to community resources:  [place]  Patient/Family's Response to care:  Patient appreciative and agreeable to plans as above.   Patient/Family's Understanding of and Emotional Response to Diagnosis, Current Treatment, and Prognosis:  Patient seems to have a good understanding of her diagnoses/treatment and ultimate needs and plans for SNF.   Emotional Assessment Appearance:  Developmentally appropriate Attitude/Demeanor/Rapport:    Affect (typically observed):  Accepting, Appropriate Orientation:  Oriented to Self, Oriented to Place, Oriented to  Time, Oriented to  Situation Alcohol / Substance use:  Not Applicable Psych involvement (Current and /or in the community):  No (Comment)  Discharge Needs  Concerns to be addressed:  Discharge Planning Concerns Readmission within the last 30 days:  No Current discharge risk:  Physical Impairment Barriers to Discharge:  No Barriers Identified   Ludwig Clarks, LCSW 02/09/2015, 2:25 PM

## 2015-02-10 MED ORDER — LEVOFLOXACIN 750 MG PO TABS: 750.0000 mg | ORAL_TABLET | ORAL | Status: DC

## 2015-02-10 MED ORDER — TAMSULOSIN HCL 0.4 MG PO CAPS: 0.4000 mg | ORAL_CAPSULE | Freq: Every day | ORAL | Status: DC

## 2015-02-10 NOTE — Progress Notes (Signed)
   02/10/15 1000  Clinical Encounter Type  Visited With Patient;Patient and family together;Family  Visit Type Social support;Spiritual support;Psychological support;Follow-up  Referral From Chaplain  Consult/Referral To Chaplain  Spiritual Encounters  Spiritual Needs Grief support;Emotional  Stress Factors  Patient Stress Factors Health changes;Exhausted;Family relationships;Major life changes;Loss of control;Lack of caregivers  Family Stress Factors Family relationships;Health changes;Major life changes  Advance Directives (For Healthcare)  Does patient have an advance directive? No  Would patient like information on creating an advanced directive? Yes - Scientist, clinical (histocompatibility and immunogenetics) given (Patient not reviewed yet)  [place] visited with pt and her husband; [place] briefly discussed advanced directive which pt has not yet reviewed; pt spouse supportive; pt seemed exhausted and worried; grief and spiritual support offered including blessing of healing; 10:43 AM Gwynn Burly

## 2015-02-10 NOTE — Progress Notes (Signed)
Spoke with pt concerning HH, she referred me to her daughter Judson Roch [phone]. Daughter selected [place] for [facility]. Pt will discharge to her daughter's home at  [address], [place] [address].  Referral called to [facility] in house rep.

## 2015-02-10 NOTE — Progress Notes (Signed)
Patient reports she has decided to go home with [facility] and not to pursue SNF- "I'm gonna go stay with my daughter".  RNCM and MD aware- HH/DME to be arranged by [facility] as needed. CSW will sign off- Eduard Clos, MSW, [place]

## 2015-02-10 NOTE — Discharge Summary (Signed)
Physician Discharge Summary  Gina Fritz PTW:[id number] DOB: 09-Mar-1941 DOA: 02/05/2015  PCP: Gina Shire, MD  Admit date: 02/05/2015 Discharge date: 02/10/2015  Time spent: *25 minutes  Recommendations for Outpatient Follow-up:  1. Follow up PCP in 2 weeks 2. Follow up Pulmonary clinic in 1-2 weeks  Discharge Diagnoses:  Active Problems:   Abdominal pain   Pneumonia   CAP (community acquired pneumonia)   Kidney stone on right side   Colitis   Malnutrition of moderate degree   Hemoptysis   Discharge Condition: Stable  Diet recommendation: Low salt diet  Filed Weights   02/05/15 1908  Weight: 47.174 kg (104 lb)    History of present illness:  74 year old female came to the hospital with worsening abdominal pain, recently saw urologist underwent cystoscopy at that time was noted to have blood in the urinary bladder along with 3 mm kidney stone in the right kidney which did not require intervention. In the ED CT chest abdomen pelvis was done which showed right sided colitis, right middle and lower lobe pneumonia, right ureter inflammation and 3 mm stone at the uterocervical junction.Patient started on Levaquin and Flagyl. Abdominal ultrasound was done which did not show any significant abnormality. HIDA scan was done which was negative for cholecystitis, pelvic ultrasound showed no significant abnormality. Patient is slowly improving but continues to have cough with hemoptysis.  Hospital Course:  1. Community-acquired pneumonia-improving, patient started on Levaquin she has allergy to penicillin. Improving. Blood cultures  negative so far. Urinary strep pneumo antigen negative. Pulmonary is following, will see the patient as outpatient. Will discharge the patient On Lebaquin for four more days for total 10 days of therapy. 2. Hemoptysis-  resolved, likely due to above, patient had workup done in the past for lung mass. Pulmonary following.Will follow up as  outpatient. 3. Abdominal pain- improved, likely from colitis versus right ureteral stone. Continued with  pain medications, IV antibiotics. urine culture resulted diphtheroids, contamination. Abdominal ultrasound did not show cholecystitis or any other biliary pathology. Called and discussed with surgeon Dr. Dalbert Fritz who recommended getting HIDA scan. We obtained HIDA scan , which was negative for cholecystitis.  4. Diarrhea- stool for C. difficile PCR was obtained this morning which was negative for C. difficile. Started Imodium 2 mg every 6 hours when necessary for diarrhea. 5. Hypokalemia- replaced potassium. 6. ? Intermittent vaginal bleeding- patient is supposed to follow with GYN as outpatient, obtained pelvic ultrasound which was negative for significant abnormality. 7. Colitis- resolved,  CT abdomen pelvis revealed inflammation in the ascending colon, continued Flagyl and Levaquin. Will discontinue Flagyl at this time.  8. Hypertension-  Continue HCTZ and Losartan.  Procedures:  None   Consultations:  Pulmonary   Discharge Exam: Filed Vitals:   02/10/15 0440  BP: 172/86  Pulse: 99  Temp: 98.3 F (36.8 C)  Resp: 20    General: Appears in no acute distress Cardiovascular: S1S2 RRR Respiratory: Clear bilaterally Abdomen- soft, nontender  Discharge Instructions   Discharge Instructions    Diet - low sodium heart healthy    Complete by:  As directed      Increase activity slowly    Complete by:  As directed           Current Discharge Medication List    START taking these medications   Details  ibuprofen (ADVIL,MOTRIN) 600 MG tablet Take 1 tablet (600 mg total) by mouth every 8 (eight) hours as needed. Qty: 15 tablet, Refills: 0  levofloxacin (LEVAQUIN) 750 MG tablet Take 1 tablet (750 mg total) by mouth every other day. Qty: 2 tablet, Refills: 0    tamsulosin (FLOMAX) 0.4 MG CAPS capsule Take 1 capsule (0.4 mg total) by mouth daily after breakfast. Qty: 30  capsule, Refills: 2      CONTINUE these medications which have NOT CHANGED   Details  acetaminophen (TYLENOL) 500 MG tablet Take 1,000 mg by mouth every 6 (six) hours as needed for moderate pain or headache.    aspirin EC 81 MG tablet Take 81 mg by mouth daily.    atorvastatin (LIPITOR) 40 MG tablet Take 40 mg by mouth daily.    BESIVANCE 0.6 % SUSP Place 1 drop into the right eye 4 (four) times daily. The day before and the day of the dr visit    Cholecalciferol (VITAMIN D) 2000 UNITS CAPS Take 2,000 Units by mouth daily.    DONNATAL 16.2 MG tablet 16.2 tablets daily.    escitalopram (LEXAPRO) 20 MG tablet     fenofibrate micronized (ANTARA) 130 MG capsule Take 130 mg by mouth daily. Refills: 1    fluticasone (FLONASE) 50 MCG/ACT nasal spray Place 2 sprays into the nose daily.    hydrALAZINE (APRESOLINE) 25 MG tablet Take 25 mg by mouth 2 (two) times daily.    hydrochlorothiazide (HYDRODIURIL) 25 MG tablet Take 25 mg by mouth daily.    loperamide (IMODIUM) 2 MG capsule Take 2-4 mg by mouth 4 (four) times daily as needed for diarrhea or loose stools. For upset stomach    losartan (COZAAR) 50 MG tablet Take 50 mg by mouth 2 (two) times daily.     meclizine (ANTIVERT) 25 MG tablet Take 25 mg by mouth daily as needed for dizziness.     mometasone (ASMANEX) 220 MCG/INH inhaler Inhale 2 puffs into the lungs 2 (two) times daily.     montelukast (SINGULAIR) 10 MG tablet Take 10 mg by mouth daily.     Multiple Vitamin (MULTIVITAMIN WITH MINERALS) TABS Take 1 tablet by mouth daily.    Multiple Vitamins-Minerals (ICAPS AREDS 2 PO) Take 1 tablet by mouth 2 (two) times daily.    ranitidine (ZANTAC) 300 MG tablet Take 300 mg by mouth 2 (two) times daily.    solifenacin (VESICARE) 10 MG tablet Take 10 mg by mouth daily.       STOP taking these medications     naproxen sodium (ANAPROX) 220 MG tablet        Allergies  Allergen Reactions  . Penicillins Anaphylaxis    Has  patient had a PCN reaction causing immediate rash, facial/tongue/throat swelling, SOB or lightheadedness with hypotension: Yes Has patient had a PCN reaction causing severe rash involving mucus membranes or skin necrosis: Yes Has patient had a PCN reaction that required hospitalization Yes Has patient had a PCN reaction occurring within the last 10 years: No If all of the above answers are "NO", then may proceed with Cephalosporin use.   . Soy Allergy Itching and Rash  . Sulfonamide Derivatives Rash   Follow-up Information    Follow up with Gina Shire, MD.   Specialty:  Family Medicine   Why:  Please call your doctor for follow up in 2-5 days   Contact information:   [address] [place] [address] [phone]       Follow up with [facility]   Specialty:  Emergency Medicine   Why:  Return to ER for  any new or worsening symptoms   Contact information:   [address] [id number] [place] [phone]      Please follow up.   Why:  Please follow-up with your gynecologist as scheduled      Follow up with PARRETT,TAMMY, NP On 03/03/2015.   Specialty:  Pulmonary Disease   Why:  report for check in at 230pm, then downstairs to XRAY and then f/u w/ Tammy at 3pm.    Contact information:   [address] [place] [address] [phone]       Follow up with Chesley Mires, MD On 05/22/2015.   Specialty:  Pulmonary Disease   Why:  245pm   Contact information:   [address] [place] [address] [phone]       Follow up with BURNETT,BRENT A, MD In 2 weeks.   Specialty:  Family Medicine   Contact information:   [address] [place] [address] [phone]        The results of significant diagnostics from this hospitalization (including imaging, microbiology, ancillary and laboratory) are listed below for reference.    Significant Diagnostic  Studies: Dg Chest 2 View  02/09/2015  CLINICAL DATA:  Community-acquired pneumonia, abdominal pain, history of right-sided kidney stones. EXAM: CHEST  2 VIEW COMPARISON:  CT scan of the abdomen and pelvis which included the lower chest dated February 05, 2015, and PA and lateral chest x-ray of the same day. FINDINGS: The lungs remain hyperinflated. Confluent alveolar opacity is developed in the right middle and lower lobes. The left lung is clear. A trace of pleural fluid blunts the posterior costophrenic angle on the left. The heart and pulmonary vascularity are normal. The mediastinum is normal in width. There is gentle levocurvature of the thoracolumbar spine. IMPRESSION: Progressive interstitial and alveolar opacity in the right middle and lower lobes consistent with pneumonia. There is underlying COPD. A tiny pleural effusion has developed on the left. Electronically Signed   By: David  Martinique M.D.   On: 02/09/2015 09:22   Dg Chest 2 View  02/05/2015  CLINICAL DATA:  Productive cough for 1 week EXAM: CHEST  2 VIEW COMPARISON:  Chest CT July 25, 2006 FINDINGS: There is focal airspace consolidation in the right middle lobe. The lungs elsewhere are clear. Heart size and pulmonary vascularity are normal. No adenopathy. There is mild levoscoliosis in the mid thoracic region. IMPRESSION: Airspace consolidation in the right middle lobe. Lungs elsewhere clear. Followup PA and lateral chest radiographs recommended in 3-4 weeks following trial of antibiotic therapy to ensure resolution and exclude underlying malignancy. Electronically Signed   By: Lowella Grip III M.D.   On: 02/05/2015 14:53   Ct Chest W Contrast  02/05/2015  ADDENDUM REPORT: 02/05/2015 17:41 ADDENDUM: Aberrant right subclavian artery incidentally noted. Electronically Signed   By: Nolon Nations M.D.   On: 02/05/2015 17:41  02/05/2015  CLINICAL DATA:  Recently had cystoscopy, found a kidney stone/blood in bladder, recommended to see a PT  (pain) and GYN (vaginal bleeding). Has been feeling bad x 1 week. Came here today for worsening pain in abdomen into back and pain in her neck. EXAM: CT CHEST, ABDOMEN, AND PELVIS WITH CONTRAST TECHNIQUE: Multidetector CT imaging of the chest, abdomen and pelvis was performed following the standard protocol during bolus administration of intravenous contrast. CONTRAST:  36mL OMNIPAQUE IOHEXOL 300 MG/ML SOLN, 190mL OMNIPAQUE IOHEXOL 300 MG/ML SOLN COMPARISON:  Chest x-ray 02/05/2015, chest CT 07/25/2006 FINDINGS:  CT CHEST FINDINGS Mediastinum/Nodes: And 1.9 cm low-attenuation lesion is identified within the right lobe of the thyroid. This appears stable. Small likely reactive mediastinal lymph nodes present. The esophagus is normal in appearance. Coronary artery calcifications are present. No pericardial effusions. Heart size is normal. There is moderate atherosclerotic calcification of the thoracic aorta. No associated aneurysm. Lungs/Pleura: Significant emphysematous changes are identified, similar in appearance to prior study. Numerous subpleural nodules also stable there has been development of right middle and lower lobe infiltrate. Trace right pleural effusion is present. Musculoskeletal: No suspicious lytic or blastic lesions are identified. CT ABDOMEN PELVIS FINDINGS Hepatobiliary: No focal abnormality identified within the liver. There is mild intrahepatic biliary duct dilatation. Gallbladder is mildly distended. No radiopaque calculi are identified within the gallbladder. Pancreas: No focal abnormality identified within the pancreas. There is mild dilatation of the pancreatic duct. Spleen: Spleen is mildly prominent in size no focal lesions. Adrenals/Urinary Tract: Left adrenal gland is somewhat prominent in size without discrete lesion. Right adrenal gland is normal in appearance. There is mild right-sided hydroureter and enhancement of the right ureteral wall secondary to a stone at the right  ureterovesical junction measuring 3 mm. Bilateral parapelvic and renal parenchymal cysts are present. Stomach/Bowel: The stomach and small bowel loops are normal in appearance. The appendix is surgically absent. There is enhancement and mild thickening of the ascending colonic wall, extending to fall the transverse colon. The descending colon has a normal appearance. Vascular/Lymphatic: There is atherosclerotic calcification of the abdominal aorta. No aneurysm. No retroperitoneal or mesenteric adenopathy. Reproductive: The uterus is present and appears anteflexed. No adnexal mass identified. Other: No free pelvic fluid. Musculoskeletal: Thoracic and lumbar spondylosis. No suspicious lytic or blastic lesions are identified. IMPRESSION: 1. Obstructing right ureteral stone measuring 3 mm at the ureterovesical junction. 2. Enhancement of the right ureteral wall consistent with infection/inflammation. 3. Enhancement of the mucosa of the ascending colonic wall, this issue with mild wall thickening. Findings are consistent with colitis. 4. Distended gallbladder and mildly distended intrahepatic biliary ducts. This appears to be a new finding. Further evaluation with abdominal ultrasound may be helpful. 5. Coronary artery disease. 6. Atherosclerosis of the thoracic and abdominal aorta. 7. Mild splenomegaly. Electronically Signed: By: Nolon Nations M.D. On: 02/05/2015 17:03   Nm Hepatobiliary Liver Func  02/06/2015  CLINICAL DATA:  Abdominal pain and nausea. EXAM: NUCLEAR MEDICINE HEPATOBILIARY IMAGING TECHNIQUE: Sequential images of the abdomen were obtained out to 60 minutes following intravenous administration of radiopharmaceutical. RADIOPHARMACEUTICALS:  5.4 mCi Tc-45m  Choletec IV COMPARISON:  Ultrasound dated 02/05/2015 FINDINGS: The gallbladder is visualized at 15 minutes. Activity is seen in the bowel at 30 minutes. IMPRESSION: Normal hepatobiliary scan. Electronically Signed   By: Lorriane Fritz M.D.   On:  02/06/2015 13:38   US Abdomen Complete  02/05/2015  CLINICAL DATA:  74 year old female with abdominal pain EXAM: ULTRASOUND ABDOMEN COMPLETE COMPARISON:  Prior CT abdomen/ pelvis obtained earlier today FINDINGS: Gallbladder: The gallbladder is distended. There is a small amount of sludge layering within the gallbladder lumen. No gallbladder wall thickening or pericholecystic fluid. Per the sonographer, the sonographic Percell Miller sign was negative. Common bile duct: Diameter: Within normal limits at 2 mm Liver: No focal lesion identified. Within normal limits in parenchymal echogenicity. Main portal vein is patent. IVC: No abnormality visualized. Pancreas: Visualized portion unremarkable. Spleen: Size and appearance within normal limits. Right Kidney: Length: 9 cm. Echogenicity within normal limits. No mass or hydronephrosis visualized. 2.1 cm simple cyst in  the interpolar region. Left Kidney: Length: 9.6 cm. Echogenicity within normal limits. No mass or hydronephrosis visualized. Abdominal aorta: No aneurysm visualized. Other findings: None. IMPRESSION: 1. Sludge within the gallbladder lumen but no secondary sonographic findings of acute cholecystitis. 2. Simple parapelvic cyst in the interpolar right kidney. 3. No right-sided hydronephrosis by ultrasound. Electronically Signed   By: Jacqulynn Cadet M.D.   On: 02/05/2015 21:22   US Transvaginal Non-ob  02/06/2015  CLINICAL DATA:  Vaginal bleeding for 3 weeks EXAM: TRANSABDOMINAL AND TRANSVAGINAL ULTRASOUND OF PELVIS TECHNIQUE: Both transabdominal and transvaginal ultrasound examinations of the pelvis were performed. Transabdominal technique was performed for global imaging of the pelvis including uterus, ovaries, adnexal regions, and pelvic cul-de-sac. It was necessary to proceed with endovaginal exam following the transabdominal exam to visualize the endometrium. COMPARISON:  CT 02/05/2015 FINDINGS: Uterus Measurements: 5.9 x 3.0 x 3.8 cm. No fibroids or other  mass visualized. Endometrium Thickness: 2 mm.  No focal abnormality visualized. Right ovary Measurements: Not visualized. No adnexal masses seen. Left ovary Measurements: Not visualized. No adnexal masses seen Other findings No free fluid. IMPRESSION: No acute findings. Ovaries cannot be visualized due to overlying bowel gas. Electronically Signed   By: Rolm Baptise M.D.   On: 02/06/2015 16:40   US Pelvis Complete  02/06/2015  CLINICAL DATA:  Vaginal bleeding for 3 weeks EXAM: TRANSABDOMINAL AND TRANSVAGINAL ULTRASOUND OF PELVIS TECHNIQUE: Both transabdominal and transvaginal ultrasound examinations of the pelvis were performed. Transabdominal technique was performed for global imaging of the pelvis including uterus, ovaries, adnexal regions, and pelvic cul-de-sac. It was necessary to proceed with endovaginal exam following the transabdominal exam to visualize the endometrium. COMPARISON:  CT 02/05/2015 FINDINGS: Uterus Measurements: 5.9 x 3.0 x 3.8 cm. No fibroids or other mass visualized. Endometrium Thickness: 2 mm.  No focal abnormality visualized. Right ovary Measurements: Not visualized. No adnexal masses seen. Left ovary Measurements: Not visualized. No adnexal masses seen Other findings No free fluid. IMPRESSION: No acute findings. Ovaries cannot be visualized due to overlying bowel gas. Electronically Signed   By: Rolm Baptise M.D.   On: 02/06/2015 16:40   Ct Abdomen Pelvis W Contrast  02/05/2015  ADDENDUM REPORT: 02/05/2015 17:41 ADDENDUM: Aberrant right subclavian artery incidentally noted. Electronically Signed   By: Nolon Nations M.D.   On: 02/05/2015 17:41  02/05/2015  CLINICAL DATA:  Recently had cystoscopy, found a kidney stone/blood in bladder, recommended to see a PT (pain) and GYN (vaginal bleeding). Has been feeling bad x 1 week. Came here today for worsening pain in abdomen into back and pain in her neck. EXAM: CT CHEST, ABDOMEN, AND PELVIS WITH CONTRAST TECHNIQUE: Multidetector CT  imaging of the chest, abdomen and pelvis was performed following the standard protocol during bolus administration of intravenous contrast. CONTRAST:  62mL OMNIPAQUE IOHEXOL 300 MG/ML SOLN, 142mL OMNIPAQUE IOHEXOL 300 MG/ML SOLN COMPARISON:  Chest x-ray 02/05/2015, chest CT 07/25/2006 FINDINGS: CT CHEST FINDINGS Mediastinum/Nodes: And 1.9 cm low-attenuation lesion is identified within the right lobe of the thyroid. This appears stable. Small likely reactive mediastinal lymph nodes present. The esophagus is normal in appearance. Coronary artery calcifications are present. No pericardial effusions. Heart size is normal. There is moderate atherosclerotic calcification of the thoracic aorta. No associated aneurysm. Lungs/Pleura: Significant emphysematous changes are identified, similar in appearance to prior study. Numerous subpleural nodules also stable there has been development of right middle and lower lobe infiltrate. Trace right pleural effusion is present. Musculoskeletal: No suspicious lytic or blastic lesions are  identified. CT ABDOMEN PELVIS FINDINGS Hepatobiliary: No focal abnormality identified within the liver. There is mild intrahepatic biliary duct dilatation. Gallbladder is mildly distended. No radiopaque calculi are identified within the gallbladder. Pancreas: No focal abnormality identified within the pancreas. There is mild dilatation of the pancreatic duct. Spleen: Spleen is mildly prominent in size no focal lesions. Adrenals/Urinary Tract: Left adrenal gland is somewhat prominent in size without discrete lesion. Right adrenal gland is normal in appearance. There is mild right-sided hydroureter and enhancement of the right ureteral wall secondary to a stone at the right ureterovesical junction measuring 3 mm. Bilateral parapelvic and renal parenchymal cysts are present. Stomach/Bowel: The stomach and small bowel loops are normal in appearance. The appendix is surgically absent. There is enhancement  and mild thickening of the ascending colonic wall, extending to fall the transverse colon. The descending colon has a normal appearance. Vascular/Lymphatic: There is atherosclerotic calcification of the abdominal aorta. No aneurysm. No retroperitoneal or mesenteric adenopathy. Reproductive: The uterus is present and appears anteflexed. No adnexal mass identified. Other: No free pelvic fluid. Musculoskeletal: Thoracic and lumbar spondylosis. No suspicious lytic or blastic lesions are identified. IMPRESSION: 1. Obstructing right ureteral stone measuring 3 mm at the ureterovesical junction. 2. Enhancement of the right ureteral wall consistent with infection/inflammation. 3. Enhancement of the mucosa of the ascending colonic wall, this issue with mild wall thickening. Findings are consistent with colitis. 4. Distended gallbladder and mildly distended intrahepatic biliary ducts. This appears to be a new finding. Further evaluation with abdominal ultrasound may be helpful. 5. Coronary artery disease. 6. Atherosclerosis of the thoracic and abdominal aorta. 7. Mild splenomegaly. Electronically Signed: By: Nolon Nations M.D. On: 02/05/2015 17:03    Microbiology: Recent Results (from the past 240 hour(s))  Urine culture     Status: None   Collection Time: 02/05/15  1:39 PM  Result Value Ref Range Status   Specimen Description URINE, CLEAN CATCH  Final   Special Requests NONE  Final   Culture   Final    60,000 COLONIES/ml DIPHTHEROIDS(CORYNEBACTERIUM SPECIES) Performed at [facility]    Report Status 02/07/2015 FINAL  Final  C difficile quick scan w PCR reflex     Status: None   Collection Time: 02/08/15  7:00 AM  Result Value Ref Range Status   C Diff antigen NEGATIVE NEGATIVE Final   C Diff toxin NEGATIVE NEGATIVE Final   C Diff interpretation Negative for toxigenic C. difficile  Final     Labs: Basic Metabolic Panel:  Recent Labs Lab 02/05/15 1324 02/06/15 0422 02/08/15 0508  02/09/15 0455  NA 136 140 141 137  K 4.8 4.1 3.1* 4.2  CL 99* 105 107 106  CO2 28 25 25 24   GLUCOSE 117* 70 124* 116*  BUN 18 12 5* <5*  CREATININE 1.30* 0.85 0.59 0.72  CALCIUM 10.1 9.1 8.8* 8.8*   Liver Function Tests:  Recent Labs Lab 02/05/15 1324 02/06/15 0422 02/09/15 0455  AST 66* 36 37  ALT 55* 37 29  ALKPHOS 139* 112 132*  BILITOT 1.3* 0.5 0.5  PROT 7.4 6.0* 5.7*  ALBUMIN 3.8 3.0* 2.7*    Recent Labs Lab 02/05/15 1324  LIPASE 23   No results for input(s): AMMONIA in the last 168 hours. CBC:  Recent Labs Lab 02/05/15 1324 02/06/15 0422 02/08/15 0508 02/09/15 0455  WBC 12.4* 7.9 5.3 5.6  HGB 11.6* 10.3* 10.1* 9.7*  HCT 36.2 32.2* 32.0* 30.7*  MCV 84.6 85.6 85.6 85.3  PLT 285  239 235 248    CBG:  Recent Labs Lab 02/09/15 0640  GLUCAP 112*       Signed:  LAMA,GAGAN S  [facility] 02/10/2015, 11:38 AM

## 2015-02-11 DIAGNOSIS — J449 Chronic obstructive pulmonary disease, unspecified: Secondary | ICD-10-CM | POA: Diagnosis not present

## 2015-02-11 DIAGNOSIS — K589 Irritable bowel syndrome without diarrhea: Secondary | ICD-10-CM | POA: Diagnosis not present

## 2015-02-11 DIAGNOSIS — N200 Calculus of kidney: Secondary | ICD-10-CM | POA: Diagnosis not present

## 2015-02-11 DIAGNOSIS — N939 Abnormal uterine and vaginal bleeding, unspecified: Secondary | ICD-10-CM | POA: Diagnosis not present

## 2015-02-11 DIAGNOSIS — F17200 Nicotine dependence, unspecified, uncomplicated: Secondary | ICD-10-CM | POA: Diagnosis not present

## 2015-02-11 DIAGNOSIS — F329 Major depressive disorder, single episode, unspecified: Secondary | ICD-10-CM | POA: Diagnosis not present

## 2015-02-11 DIAGNOSIS — Z7982 Long term (current) use of aspirin: Secondary | ICD-10-CM | POA: Diagnosis not present

## 2015-02-11 DIAGNOSIS — I10 Essential (primary) hypertension: Secondary | ICD-10-CM | POA: Diagnosis not present

## 2015-02-11 DIAGNOSIS — E785 Hyperlipidemia, unspecified: Secondary | ICD-10-CM | POA: Diagnosis not present

## 2015-02-12 DIAGNOSIS — N939 Abnormal uterine and vaginal bleeding, unspecified: Secondary | ICD-10-CM | POA: Diagnosis not present

## 2015-02-12 DIAGNOSIS — F329 Major depressive disorder, single episode, unspecified: Secondary | ICD-10-CM | POA: Diagnosis not present

## 2015-02-12 DIAGNOSIS — N200 Calculus of kidney: Secondary | ICD-10-CM | POA: Diagnosis not present

## 2015-02-12 DIAGNOSIS — K589 Irritable bowel syndrome without diarrhea: Secondary | ICD-10-CM | POA: Diagnosis not present

## 2015-02-12 DIAGNOSIS — J449 Chronic obstructive pulmonary disease, unspecified: Secondary | ICD-10-CM | POA: Diagnosis not present

## 2015-02-12 DIAGNOSIS — I10 Essential (primary) hypertension: Secondary | ICD-10-CM | POA: Diagnosis not present

## 2015-02-13 DIAGNOSIS — N939 Abnormal uterine and vaginal bleeding, unspecified: Secondary | ICD-10-CM | POA: Diagnosis not present

## 2015-02-13 DIAGNOSIS — J449 Chronic obstructive pulmonary disease, unspecified: Secondary | ICD-10-CM | POA: Diagnosis not present

## 2015-02-13 DIAGNOSIS — I10 Essential (primary) hypertension: Secondary | ICD-10-CM | POA: Diagnosis not present

## 2015-02-13 DIAGNOSIS — K589 Irritable bowel syndrome without diarrhea: Secondary | ICD-10-CM | POA: Diagnosis not present

## 2015-02-13 DIAGNOSIS — F329 Major depressive disorder, single episode, unspecified: Secondary | ICD-10-CM | POA: Diagnosis not present

## 2015-02-13 DIAGNOSIS — N200 Calculus of kidney: Secondary | ICD-10-CM | POA: Diagnosis not present

## 2015-02-14 DIAGNOSIS — F329 Major depressive disorder, single episode, unspecified: Secondary | ICD-10-CM | POA: Diagnosis not present

## 2015-02-14 DIAGNOSIS — J449 Chronic obstructive pulmonary disease, unspecified: Secondary | ICD-10-CM | POA: Diagnosis not present

## 2015-02-14 DIAGNOSIS — N200 Calculus of kidney: Secondary | ICD-10-CM | POA: Diagnosis not present

## 2015-02-14 DIAGNOSIS — I10 Essential (primary) hypertension: Secondary | ICD-10-CM | POA: Diagnosis not present

## 2015-02-14 DIAGNOSIS — K589 Irritable bowel syndrome without diarrhea: Secondary | ICD-10-CM | POA: Diagnosis not present

## 2015-02-14 DIAGNOSIS — N939 Abnormal uterine and vaginal bleeding, unspecified: Secondary | ICD-10-CM | POA: Diagnosis not present

## 2015-02-16 DIAGNOSIS — N201 Calculus of ureter: Secondary | ICD-10-CM | POA: Diagnosis not present

## 2015-02-16 DIAGNOSIS — I10 Essential (primary) hypertension: Secondary | ICD-10-CM | POA: Diagnosis not present

## 2015-02-16 DIAGNOSIS — N200 Calculus of kidney: Secondary | ICD-10-CM | POA: Diagnosis not present

## 2015-02-16 DIAGNOSIS — J449 Chronic obstructive pulmonary disease, unspecified: Secondary | ICD-10-CM | POA: Diagnosis not present

## 2015-02-16 DIAGNOSIS — K529 Noninfective gastroenteritis and colitis, unspecified: Secondary | ICD-10-CM | POA: Diagnosis not present

## 2015-02-16 DIAGNOSIS — F329 Major depressive disorder, single episode, unspecified: Secondary | ICD-10-CM | POA: Diagnosis not present

## 2015-02-16 DIAGNOSIS — K589 Irritable bowel syndrome without diarrhea: Secondary | ICD-10-CM | POA: Diagnosis not present

## 2015-02-16 DIAGNOSIS — J189 Pneumonia, unspecified organism: Secondary | ICD-10-CM | POA: Diagnosis not present

## 2015-02-16 DIAGNOSIS — N939 Abnormal uterine and vaginal bleeding, unspecified: Secondary | ICD-10-CM | POA: Diagnosis not present

## 2015-02-16 DIAGNOSIS — E46 Unspecified protein-calorie malnutrition: Secondary | ICD-10-CM | POA: Diagnosis not present

## 2015-02-17 DIAGNOSIS — J449 Chronic obstructive pulmonary disease, unspecified: Secondary | ICD-10-CM | POA: Diagnosis not present

## 2015-02-17 DIAGNOSIS — N939 Abnormal uterine and vaginal bleeding, unspecified: Secondary | ICD-10-CM | POA: Diagnosis not present

## 2015-02-17 DIAGNOSIS — N200 Calculus of kidney: Secondary | ICD-10-CM | POA: Diagnosis not present

## 2015-02-17 DIAGNOSIS — K589 Irritable bowel syndrome without diarrhea: Secondary | ICD-10-CM | POA: Diagnosis not present

## 2015-02-17 DIAGNOSIS — F329 Major depressive disorder, single episode, unspecified: Secondary | ICD-10-CM | POA: Diagnosis not present

## 2015-02-17 DIAGNOSIS — I10 Essential (primary) hypertension: Secondary | ICD-10-CM | POA: Diagnosis not present

## 2015-02-18 DIAGNOSIS — K589 Irritable bowel syndrome without diarrhea: Secondary | ICD-10-CM | POA: Diagnosis not present

## 2015-02-18 DIAGNOSIS — F329 Major depressive disorder, single episode, unspecified: Secondary | ICD-10-CM | POA: Diagnosis not present

## 2015-02-18 DIAGNOSIS — N939 Abnormal uterine and vaginal bleeding, unspecified: Secondary | ICD-10-CM | POA: Diagnosis not present

## 2015-02-18 DIAGNOSIS — I10 Essential (primary) hypertension: Secondary | ICD-10-CM | POA: Diagnosis not present

## 2015-02-18 DIAGNOSIS — N200 Calculus of kidney: Secondary | ICD-10-CM | POA: Diagnosis not present

## 2015-02-18 DIAGNOSIS — J449 Chronic obstructive pulmonary disease, unspecified: Secondary | ICD-10-CM | POA: Diagnosis not present

## 2015-02-19 DIAGNOSIS — I10 Essential (primary) hypertension: Secondary | ICD-10-CM | POA: Diagnosis not present

## 2015-02-19 DIAGNOSIS — F329 Major depressive disorder, single episode, unspecified: Secondary | ICD-10-CM | POA: Diagnosis not present

## 2015-02-19 DIAGNOSIS — J449 Chronic obstructive pulmonary disease, unspecified: Secondary | ICD-10-CM | POA: Diagnosis not present

## 2015-02-19 DIAGNOSIS — N939 Abnormal uterine and vaginal bleeding, unspecified: Secondary | ICD-10-CM | POA: Diagnosis not present

## 2015-02-19 DIAGNOSIS — K589 Irritable bowel syndrome without diarrhea: Secondary | ICD-10-CM | POA: Diagnosis not present

## 2015-02-19 DIAGNOSIS — N200 Calculus of kidney: Secondary | ICD-10-CM | POA: Diagnosis not present

## 2015-02-23 DIAGNOSIS — I10 Essential (primary) hypertension: Secondary | ICD-10-CM | POA: Diagnosis not present

## 2015-02-23 DIAGNOSIS — N939 Abnormal uterine and vaginal bleeding, unspecified: Secondary | ICD-10-CM | POA: Diagnosis not present

## 2015-02-23 DIAGNOSIS — F329 Major depressive disorder, single episode, unspecified: Secondary | ICD-10-CM | POA: Diagnosis not present

## 2015-02-23 DIAGNOSIS — J449 Chronic obstructive pulmonary disease, unspecified: Secondary | ICD-10-CM | POA: Diagnosis not present

## 2015-02-23 DIAGNOSIS — N200 Calculus of kidney: Secondary | ICD-10-CM | POA: Diagnosis not present

## 2015-02-23 DIAGNOSIS — K589 Irritable bowel syndrome without diarrhea: Secondary | ICD-10-CM | POA: Diagnosis not present

## 2015-02-24 DIAGNOSIS — N200 Calculus of kidney: Secondary | ICD-10-CM | POA: Diagnosis not present

## 2015-02-24 DIAGNOSIS — J449 Chronic obstructive pulmonary disease, unspecified: Secondary | ICD-10-CM | POA: Diagnosis not present

## 2015-02-24 DIAGNOSIS — I10 Essential (primary) hypertension: Secondary | ICD-10-CM | POA: Diagnosis not present

## 2015-02-24 DIAGNOSIS — N939 Abnormal uterine and vaginal bleeding, unspecified: Secondary | ICD-10-CM | POA: Diagnosis not present

## 2015-02-24 DIAGNOSIS — F329 Major depressive disorder, single episode, unspecified: Secondary | ICD-10-CM | POA: Diagnosis not present

## 2015-02-24 DIAGNOSIS — K589 Irritable bowel syndrome without diarrhea: Secondary | ICD-10-CM | POA: Diagnosis not present

## 2015-02-25 ENCOUNTER — Encounter (HOSPITAL_BASED_OUTPATIENT_CLINIC_OR_DEPARTMENT_OTHER): Payer: Self-pay | Admitting: *Deleted

## 2015-02-25 ENCOUNTER — Other Ambulatory Visit: Payer: Self-pay | Admitting: Urology

## 2015-02-25 DIAGNOSIS — J449 Chronic obstructive pulmonary disease, unspecified: Secondary | ICD-10-CM | POA: Diagnosis not present

## 2015-02-25 DIAGNOSIS — F329 Major depressive disorder, single episode, unspecified: Secondary | ICD-10-CM | POA: Diagnosis not present

## 2015-02-25 DIAGNOSIS — N200 Calculus of kidney: Secondary | ICD-10-CM | POA: Diagnosis not present

## 2015-02-25 DIAGNOSIS — N939 Abnormal uterine and vaginal bleeding, unspecified: Secondary | ICD-10-CM | POA: Diagnosis not present

## 2015-02-25 DIAGNOSIS — I10 Essential (primary) hypertension: Secondary | ICD-10-CM | POA: Diagnosis not present

## 2015-02-25 DIAGNOSIS — K589 Irritable bowel syndrome without diarrhea: Secondary | ICD-10-CM | POA: Diagnosis not present

## 2015-02-27 DIAGNOSIS — I10 Essential (primary) hypertension: Secondary | ICD-10-CM | POA: Diagnosis not present

## 2015-02-27 DIAGNOSIS — N200 Calculus of kidney: Secondary | ICD-10-CM | POA: Diagnosis not present

## 2015-02-27 DIAGNOSIS — K589 Irritable bowel syndrome without diarrhea: Secondary | ICD-10-CM | POA: Diagnosis not present

## 2015-02-27 DIAGNOSIS — F329 Major depressive disorder, single episode, unspecified: Secondary | ICD-10-CM | POA: Diagnosis not present

## 2015-02-27 DIAGNOSIS — N939 Abnormal uterine and vaginal bleeding, unspecified: Secondary | ICD-10-CM | POA: Diagnosis not present

## 2015-02-27 DIAGNOSIS — J449 Chronic obstructive pulmonary disease, unspecified: Secondary | ICD-10-CM | POA: Diagnosis not present

## 2015-03-02 DIAGNOSIS — J449 Chronic obstructive pulmonary disease, unspecified: Secondary | ICD-10-CM | POA: Diagnosis not present

## 2015-03-02 DIAGNOSIS — K589 Irritable bowel syndrome without diarrhea: Secondary | ICD-10-CM | POA: Diagnosis not present

## 2015-03-02 DIAGNOSIS — F329 Major depressive disorder, single episode, unspecified: Secondary | ICD-10-CM | POA: Diagnosis not present

## 2015-03-02 DIAGNOSIS — N939 Abnormal uterine and vaginal bleeding, unspecified: Secondary | ICD-10-CM | POA: Diagnosis not present

## 2015-03-02 DIAGNOSIS — I10 Essential (primary) hypertension: Secondary | ICD-10-CM | POA: Diagnosis not present

## 2015-03-02 DIAGNOSIS — N200 Calculus of kidney: Secondary | ICD-10-CM | POA: Diagnosis not present

## 2015-03-03 ENCOUNTER — Ambulatory Visit (INDEPENDENT_AMBULATORY_CARE_PROVIDER_SITE_OTHER)
Admission: RE | Admit: 2015-03-03 | Discharge: 2015-03-03 | Disposition: A | Discharge status: Home / Self Care | Payer: Medicare Other | Source: Ambulatory Visit | Attending: Adult Health | Admitting: Adult Health

## 2015-03-03 ENCOUNTER — Encounter: Payer: Self-pay | Admitting: Adult Health

## 2015-03-03 ENCOUNTER — Ambulatory Visit (INDEPENDENT_AMBULATORY_CARE_PROVIDER_SITE_OTHER): Payer: Medicare Other | Admitting: Adult Health

## 2015-03-03 VITALS — BP 106/62 | HR 64 | Temp 98.0°F | Ht 62.0 in | Wt 100.0 lb

## 2015-03-03 DIAGNOSIS — J189 Pneumonia, unspecified organism: Secondary | ICD-10-CM

## 2015-03-03 DIAGNOSIS — R509 Fever, unspecified: Secondary | ICD-10-CM | POA: Diagnosis not present

## 2015-03-03 DIAGNOSIS — R042 Hemoptysis: Secondary | ICD-10-CM | POA: Diagnosis not present

## 2015-03-03 DIAGNOSIS — R05 Cough: Secondary | ICD-10-CM | POA: Diagnosis not present

## 2015-03-03 NOTE — Addendum Note (Signed)
Addended by: Wynn Banker H on: 03/03/2015 03:40 PM   Modules accepted: Orders

## 2015-03-03 NOTE — Patient Instructions (Signed)
Continue on current regimen  Great job on not smoking .  Follow up with Dr. Halford Chessman in 3 months with PFT .  Please contact office for sooner follow up if symptoms do not improve or worsen or seek emergency care

## 2015-03-03 NOTE — Progress Notes (Signed)
Subjective:    Patient ID: Gina Fritz, female    DOB: 1940/10/03, 74 y.o.   MRN: [id number]  HPI  74 year old female former smoker seen for pulmonary consult during hospitalization. November 2016  For community-acquired pneumonia, hemoptysis.  03/03/2015  Post hospital follow-up.  Patient presents for a post hospital follow-up Patient was recently admitted   November 3 through November 8  For community-acquired pneumonia and hemoptysis.  CT chest showed significant emphysematous changes, numerous subpleural nodules that were stable, right middle and lower lobe infiltrate.  CT comparison from 2008  patient was treated with antibiotics and encouraged on smoking cessation.  since discharge, she is feeling better but remains sick .   She denies any hemoptysis, chest pain, orthopnea, PND or leg swelling.  she has no  Previous PFTs on record.  She is on Asmanex. CXR today shows clearing of the right middle and right lower lobe pneumonia with only a minimal atx  remaining. Has not smoked since discharge. Patient has had no hemoptysis .   She has kidney stones and planned cystoscopy with uretral stents tomorrow. Family is worried that she is too weak for this. Suggested that if this could be delayed to give her some more time that would be great to discuss with urologist.      Past Medical History  Diagnosis Date  . Hypertension   . Depression   . Allergic rhinitis   . Rash   . Irritable bowel syndrome   . Sensorineural hearing loss, unilateral   . Subjective tinnitus   . Loss of weight   . Dizziness   . COPD with emphysema ([place])   . Anxiety   . Right ureteral stone   . History of kidney stones   . Colitis     dx 02-05-2015  . CAP (community acquired pneumonia)     dx 02-05-2015  . Pulmonary nodules     benign and stable per CT  . GERD (gastroesophageal reflux disease)   . Hyperlipidemia   . History of chronic sinusitis    Current Outpatient Prescriptions on File Prior  to Visit  Medication Sig Dispense Refill  . acetaminophen (TYLENOL) 500 MG tablet Take 1,000 mg by mouth every 6 (six) hours as needed for moderate pain or headache.    Marland Kitchen aspirin EC 81 MG tablet Take 81 mg by mouth daily.    Marland Kitchen atorvastatin (LIPITOR) 40 MG tablet Take 40 mg by mouth daily.    Marland Kitchen BESIVANCE 0.6 % SUSP Place 1 drop into the right eye 4 (four) times daily. The day before and the day of the dr visit    . Cholecalciferol (VITAMIN D) 2000 UNITS CAPS Take 2,000 Units by mouth daily.    . DONNATAL 16.2 MG tablet 16.2 tablets daily.    Marland Kitchen escitalopram (LEXAPRO) 20 MG tablet     . fenofibrate micronized (ANTARA) 130 MG capsule Take 130 mg by mouth daily.  1  . fluticasone (FLONASE) 50 MCG/ACT nasal spray Place 2 sprays into the nose daily.    . hydrALAZINE (APRESOLINE) 25 MG tablet Take 25 mg by mouth 2 (two) times daily.    . hydrochlorothiazide (HYDRODIURIL) 25 MG tablet Take 25 mg by mouth daily.    Marland Kitchen ibuprofen (ADVIL,MOTRIN) 600 MG tablet Take 1 tablet (600 mg total) by mouth every 8 (eight) hours as needed. 15 tablet 0  . levofloxacin (LEVAQUIN) 750 MG tablet Take 1 tablet (750 mg total) by mouth every other day. 2 tablet  0  . loperamide (IMODIUM) 2 MG capsule Take 2-4 mg by mouth 4 (four) times daily as needed for diarrhea or loose stools. For upset stomach    . losartan (COZAAR) 50 MG tablet Take 50 mg by mouth 2 (two) times daily.     . meclizine (ANTIVERT) 25 MG tablet Take 25 mg by mouth daily as needed for dizziness.     . mometasone (ASMANEX) 220 MCG/INH inhaler Inhale 2 puffs into the lungs 2 (two) times daily.     . montelukast (SINGULAIR) 10 MG tablet Take 10 mg by mouth daily.     . Multiple Vitamin (MULTIVITAMIN WITH MINERALS) TABS Take 1 tablet by mouth daily.    . Multiple Vitamins-Minerals (ICAPS AREDS 2 PO) Take 1 tablet by mouth 2 (two) times daily.    . ranitidine (ZANTAC) 300 MG tablet Take 300 mg by mouth 2 (two) times daily.    . solifenacin (VESICARE) 10 MG tablet  Take 10 mg by mouth daily.     . tamsulosin (FLOMAX) 0.4 MG CAPS capsule Take 1 capsule (0.4 mg total) by mouth daily after breakfast. 30 capsule 2   No current facility-administered medications on file prior to visit.      Review of Systems  Constitutional:   No  weight loss, night sweats,  Fevers, chills, + fatigue, or  lassitude.  HEENT:   No headaches,  Difficulty swallowing,  Tooth/dental problems, or  Sore throat,                No sneezing, itching, ear ache, + nasal congestion, post nasal drip,   CV:  No chest pain,  Orthopnea, PND, swelling in lower extremities, anasarca, dizziness, palpitations, syncope.   GI  No heartburn, indigestion, abdominal pain, nausea, vomiting, diarrhea, change in bowel habits, loss of appetite, bloody stools.   Resp:   No chest wall deformity  Skin: no rash or lesions.  GU: no dysuria, change in color of urine, no urgency or frequency.  No flank pain, no hematuria   MS:  No joint pain or swelling.  No decreased range of motion.  No back pain.  Psych:  No change in mood or affect. No depression or anxiety.  No memory loss.          Objective:   Physical Exam GEN: A/Ox3; pleasant , NAD, thin and frail   HEENT:  [place]/AT,  EACs-clear, TMs-wnl, NOSE-clear, THROAT-clear, no lesions, no postnasal drip or exudate noted.   NECK:  Supple w/ fair ROM; no JVD; normal carotid impulses w/o bruits; no thyromegaly or nodules palpated; no lymphadenopathy.  RESP  Decreased BS in bases .no accessory muscle use, no dullness to percussion  CARD:  RRR, no m/r/g  , no peripheral edema, pulses intact, no cyanosis or clubbing.  GI:   Soft & nt; nml bowel sounds; no organomegaly or masses detected.  Musco: Warm bil, no deformities or joint swelling noted.   Neuro: alert, no focal deficits noted.    Skin: Warm, no lesions or rashes    CXR 03/03/2015  Clearing of right middle lobe -right lower lobe pneumonia with only minimal linear opacity remaining  in the right mid lobe consistent with linear atelectasis or scarring. 2. Emphysema.       Assessment & Plan:

## 2015-03-03 NOTE — Assessment & Plan Note (Signed)
Clinically, patient is improved. Chest x-ray shows clearing of her right middle and right lower lobe pneumonia.

## 2015-03-03 NOTE — Assessment & Plan Note (Signed)
Resolved and most likely secondary to recent pneumonia. Patient is a former smoker, need PFTs on return.

## 2015-03-03 NOTE — Progress Notes (Signed)
Reviewed and agree with assessment/plan. 

## 2015-03-04 DIAGNOSIS — N200 Calculus of kidney: Secondary | ICD-10-CM | POA: Diagnosis not present

## 2015-03-04 DIAGNOSIS — I10 Essential (primary) hypertension: Secondary | ICD-10-CM | POA: Diagnosis not present

## 2015-03-04 DIAGNOSIS — J449 Chronic obstructive pulmonary disease, unspecified: Secondary | ICD-10-CM | POA: Diagnosis not present

## 2015-03-04 DIAGNOSIS — F329 Major depressive disorder, single episode, unspecified: Secondary | ICD-10-CM | POA: Diagnosis not present

## 2015-03-04 DIAGNOSIS — K589 Irritable bowel syndrome without diarrhea: Secondary | ICD-10-CM | POA: Diagnosis not present

## 2015-03-04 DIAGNOSIS — N939 Abnormal uterine and vaginal bleeding, unspecified: Secondary | ICD-10-CM | POA: Diagnosis not present

## 2015-03-05 DIAGNOSIS — I10 Essential (primary) hypertension: Secondary | ICD-10-CM | POA: Diagnosis not present

## 2015-03-05 DIAGNOSIS — K589 Irritable bowel syndrome without diarrhea: Secondary | ICD-10-CM | POA: Diagnosis not present

## 2015-03-05 DIAGNOSIS — N200 Calculus of kidney: Secondary | ICD-10-CM | POA: Diagnosis not present

## 2015-03-05 DIAGNOSIS — F329 Major depressive disorder, single episode, unspecified: Secondary | ICD-10-CM | POA: Diagnosis not present

## 2015-03-05 DIAGNOSIS — J449 Chronic obstructive pulmonary disease, unspecified: Secondary | ICD-10-CM | POA: Diagnosis not present

## 2015-03-05 DIAGNOSIS — N939 Abnormal uterine and vaginal bleeding, unspecified: Secondary | ICD-10-CM | POA: Diagnosis not present

## 2015-03-07 DIAGNOSIS — T6291XA Toxic effect of unspecified noxious substance eaten as food, accidental (unintentional), initial encounter: Secondary | ICD-10-CM | POA: Diagnosis not present

## 2015-03-09 ENCOUNTER — Encounter (INDEPENDENT_AMBULATORY_CARE_PROVIDER_SITE_OTHER): Payer: Medicare Other | Admitting: Ophthalmology

## 2015-03-09 DIAGNOSIS — N200 Calculus of kidney: Secondary | ICD-10-CM | POA: Diagnosis not present

## 2015-03-09 DIAGNOSIS — J449 Chronic obstructive pulmonary disease, unspecified: Secondary | ICD-10-CM | POA: Diagnosis not present

## 2015-03-09 DIAGNOSIS — N939 Abnormal uterine and vaginal bleeding, unspecified: Secondary | ICD-10-CM | POA: Diagnosis not present

## 2015-03-09 DIAGNOSIS — K589 Irritable bowel syndrome without diarrhea: Secondary | ICD-10-CM | POA: Diagnosis not present

## 2015-03-09 DIAGNOSIS — I10 Essential (primary) hypertension: Secondary | ICD-10-CM | POA: Diagnosis not present

## 2015-03-09 DIAGNOSIS — F329 Major depressive disorder, single episode, unspecified: Secondary | ICD-10-CM | POA: Diagnosis not present

## 2015-03-11 DIAGNOSIS — F329 Major depressive disorder, single episode, unspecified: Secondary | ICD-10-CM | POA: Diagnosis not present

## 2015-03-11 DIAGNOSIS — I10 Essential (primary) hypertension: Secondary | ICD-10-CM | POA: Diagnosis not present

## 2015-03-11 DIAGNOSIS — J449 Chronic obstructive pulmonary disease, unspecified: Secondary | ICD-10-CM | POA: Diagnosis not present

## 2015-03-11 DIAGNOSIS — N200 Calculus of kidney: Secondary | ICD-10-CM | POA: Diagnosis not present

## 2015-03-11 DIAGNOSIS — K589 Irritable bowel syndrome without diarrhea: Secondary | ICD-10-CM | POA: Diagnosis not present

## 2015-03-11 DIAGNOSIS — N939 Abnormal uterine and vaginal bleeding, unspecified: Secondary | ICD-10-CM | POA: Diagnosis not present

## 2015-03-13 ENCOUNTER — Ambulatory Visit (INDEPENDENT_AMBULATORY_CARE_PROVIDER_SITE_OTHER): Payer: Medicare Other | Admitting: Adult Health

## 2015-03-13 ENCOUNTER — Ambulatory Visit (INDEPENDENT_AMBULATORY_CARE_PROVIDER_SITE_OTHER)
Admission: RE | Admit: 2015-03-13 | Discharge: 2015-03-13 | Disposition: A | Discharge status: Home / Self Care | Payer: Medicare Other | Source: Ambulatory Visit | Attending: Adult Health | Admitting: Adult Health

## 2015-03-13 ENCOUNTER — Encounter: Payer: Self-pay | Admitting: Adult Health

## 2015-03-13 VITALS — BP 114/74 | HR 67 | Temp 98.0°F | Ht 62.0 in | Wt 101.4 lb

## 2015-03-13 DIAGNOSIS — J690 Pneumonitis due to inhalation of food and vomit: Secondary | ICD-10-CM

## 2015-03-13 DIAGNOSIS — J189 Pneumonia, unspecified organism: Secondary | ICD-10-CM | POA: Diagnosis not present

## 2015-03-13 DIAGNOSIS — R05 Cough: Secondary | ICD-10-CM | POA: Diagnosis not present

## 2015-03-13 NOTE — Patient Instructions (Addendum)
Begin Symbicort 160/4.9mcg 2 puffs Twice daily  Until sample is done.  Hold Asmanex for until sample is completed then restart.  Mucinex DM Twice daily  As needed  Cough/congestion  follow up Dr. Halford Chessman  In Feb with PFT and chest xray .  Please contact office for sooner follow up if symptoms do not improve or worsen or seek emergency care

## 2015-03-16 DIAGNOSIS — J449 Chronic obstructive pulmonary disease, unspecified: Secondary | ICD-10-CM | POA: Diagnosis not present

## 2015-03-16 DIAGNOSIS — I10 Essential (primary) hypertension: Secondary | ICD-10-CM | POA: Diagnosis not present

## 2015-03-16 DIAGNOSIS — N201 Calculus of ureter: Secondary | ICD-10-CM | POA: Diagnosis not present

## 2015-03-16 DIAGNOSIS — N200 Calculus of kidney: Secondary | ICD-10-CM | POA: Diagnosis not present

## 2015-03-16 DIAGNOSIS — F329 Major depressive disorder, single episode, unspecified: Secondary | ICD-10-CM | POA: Diagnosis not present

## 2015-03-16 DIAGNOSIS — K589 Irritable bowel syndrome without diarrhea: Secondary | ICD-10-CM | POA: Diagnosis not present

## 2015-03-16 DIAGNOSIS — N939 Abnormal uterine and vaginal bleeding, unspecified: Secondary | ICD-10-CM | POA: Diagnosis not present

## 2015-03-17 DIAGNOSIS — N200 Calculus of kidney: Secondary | ICD-10-CM | POA: Diagnosis not present

## 2015-03-17 DIAGNOSIS — K589 Irritable bowel syndrome without diarrhea: Secondary | ICD-10-CM | POA: Diagnosis not present

## 2015-03-17 DIAGNOSIS — I10 Essential (primary) hypertension: Secondary | ICD-10-CM | POA: Diagnosis not present

## 2015-03-17 DIAGNOSIS — N939 Abnormal uterine and vaginal bleeding, unspecified: Secondary | ICD-10-CM | POA: Diagnosis not present

## 2015-03-17 DIAGNOSIS — F329 Major depressive disorder, single episode, unspecified: Secondary | ICD-10-CM | POA: Diagnosis not present

## 2015-03-17 DIAGNOSIS — J449 Chronic obstructive pulmonary disease, unspecified: Secondary | ICD-10-CM | POA: Diagnosis not present

## 2015-03-18 DIAGNOSIS — N200 Calculus of kidney: Secondary | ICD-10-CM | POA: Diagnosis not present

## 2015-03-18 DIAGNOSIS — J449 Chronic obstructive pulmonary disease, unspecified: Secondary | ICD-10-CM | POA: Diagnosis not present

## 2015-03-18 DIAGNOSIS — N939 Abnormal uterine and vaginal bleeding, unspecified: Secondary | ICD-10-CM | POA: Diagnosis not present

## 2015-03-18 DIAGNOSIS — K589 Irritable bowel syndrome without diarrhea: Secondary | ICD-10-CM | POA: Diagnosis not present

## 2015-03-18 DIAGNOSIS — F329 Major depressive disorder, single episode, unspecified: Secondary | ICD-10-CM | POA: Diagnosis not present

## 2015-03-18 DIAGNOSIS — I10 Essential (primary) hypertension: Secondary | ICD-10-CM | POA: Diagnosis not present

## 2015-03-23 DIAGNOSIS — F329 Major depressive disorder, single episode, unspecified: Secondary | ICD-10-CM | POA: Diagnosis not present

## 2015-03-23 DIAGNOSIS — N939 Abnormal uterine and vaginal bleeding, unspecified: Secondary | ICD-10-CM | POA: Diagnosis not present

## 2015-03-23 DIAGNOSIS — J449 Chronic obstructive pulmonary disease, unspecified: Secondary | ICD-10-CM | POA: Diagnosis not present

## 2015-03-23 DIAGNOSIS — K589 Irritable bowel syndrome without diarrhea: Secondary | ICD-10-CM | POA: Diagnosis not present

## 2015-03-23 DIAGNOSIS — I10 Essential (primary) hypertension: Secondary | ICD-10-CM | POA: Diagnosis not present

## 2015-03-23 DIAGNOSIS — N200 Calculus of kidney: Secondary | ICD-10-CM | POA: Diagnosis not present

## 2015-03-24 DIAGNOSIS — F329 Major depressive disorder, single episode, unspecified: Secondary | ICD-10-CM | POA: Diagnosis not present

## 2015-03-24 DIAGNOSIS — I10 Essential (primary) hypertension: Secondary | ICD-10-CM | POA: Diagnosis not present

## 2015-03-24 DIAGNOSIS — N200 Calculus of kidney: Secondary | ICD-10-CM | POA: Diagnosis not present

## 2015-03-24 DIAGNOSIS — J449 Chronic obstructive pulmonary disease, unspecified: Secondary | ICD-10-CM | POA: Diagnosis not present

## 2015-03-24 DIAGNOSIS — N939 Abnormal uterine and vaginal bleeding, unspecified: Secondary | ICD-10-CM | POA: Diagnosis not present

## 2015-03-24 DIAGNOSIS — K589 Irritable bowel syndrome without diarrhea: Secondary | ICD-10-CM | POA: Diagnosis not present

## 2015-03-25 ENCOUNTER — Ambulatory Visit (HOSPITAL_BASED_OUTPATIENT_CLINIC_OR_DEPARTMENT_OTHER): Admission: RE | Admit: 2015-03-25 | Payer: Medicare Other | Source: Ambulatory Visit | Admitting: Urology

## 2015-03-25 ENCOUNTER — Encounter (HOSPITAL_BASED_OUTPATIENT_CLINIC_OR_DEPARTMENT_OTHER): Admission: RE | Payer: Self-pay | Source: Ambulatory Visit

## 2015-03-25 DIAGNOSIS — I10 Essential (primary) hypertension: Secondary | ICD-10-CM | POA: Diagnosis not present

## 2015-03-25 DIAGNOSIS — K589 Irritable bowel syndrome without diarrhea: Secondary | ICD-10-CM | POA: Diagnosis not present

## 2015-03-25 DIAGNOSIS — J449 Chronic obstructive pulmonary disease, unspecified: Secondary | ICD-10-CM | POA: Diagnosis not present

## 2015-03-25 DIAGNOSIS — N939 Abnormal uterine and vaginal bleeding, unspecified: Secondary | ICD-10-CM | POA: Diagnosis not present

## 2015-03-25 DIAGNOSIS — F329 Major depressive disorder, single episode, unspecified: Secondary | ICD-10-CM | POA: Diagnosis not present

## 2015-03-25 DIAGNOSIS — N200 Calculus of kidney: Secondary | ICD-10-CM | POA: Diagnosis not present

## 2015-03-25 HISTORY — DX: Calculus of ureter: N20.1

## 2015-03-25 HISTORY — DX: Personal history of other diseases of the respiratory system: Z87.09

## 2015-03-25 HISTORY — DX: Emphysema, unspecified: J43.9

## 2015-03-25 HISTORY — DX: Noninfective gastroenteritis and colitis, unspecified: K52.9

## 2015-03-25 HISTORY — DX: Anxiety disorder, unspecified: F41.9

## 2015-03-25 HISTORY — DX: Other nonspecific abnormal finding of lung field: R91.8

## 2015-03-25 HISTORY — DX: Hyperlipidemia, unspecified: E78.5

## 2015-03-25 HISTORY — DX: Personal history of urinary calculi: Z87.442

## 2015-03-25 HISTORY — DX: Pneumonia, unspecified organism: J18.9

## 2015-03-25 HISTORY — DX: Gastro-esophageal reflux disease without esophagitis: K21.9

## 2015-03-25 SURGERY — CYSTOURETEROSCOPY, WITH STENT INSERTION
Anesthesia: General | Laterality: Right

## 2015-03-26 NOTE — Progress Notes (Signed)
Subjective:    Patient ID: Gina Fritz, female    DOB: 09/26/1940, 75 y.o.   MRN: [id number]  HPI   74 year old female former smoker seen for pulmonary consult during hospitalization. November 2016  For community-acquired pneumonia, hemoptysis.  03/13/15 Acute OV :  Presents for an acute office visit . Complains of 5 days of persistent cough, congestion clear to yellow mucus and dyspnea.  Patient was recently admitted   November 3 through November 8  For community-acquired pneumonia and hemoptysis.  CT chest showed significant emphysematous changes, numerous subpleural nodules that were stable, right middle and lower lobe infiltrate.  CT comparison from 2008   CXR today shows further improvement .   She denies any hemoptysis, chest pain, orthopnea, PND or leg swelling.  she has no  Previous PFTs on record.  She is on Asmanex. CXR today shows  Significant improvement with decreased in right sided infiltrate-incomplete clearance.  Has not smoked since discharge. Has upcoming ov for PFT in Feb.          Past Medical History  Diagnosis Date  . Hypertension   . Depression   . Allergic rhinitis   . Rash   . Irritable bowel syndrome   . Sensorineural hearing loss, unilateral   . Subjective tinnitus   . Loss of weight   . Dizziness   . COPD with emphysema ([place])   . Anxiety   . Right ureteral stone   . History of kidney stones   . Colitis     dx 02-05-2015  . CAP (community acquired pneumonia)     dx 02-05-2015  . Pulmonary nodules     benign and stable per CT  . GERD (gastroesophageal reflux disease)   . Hyperlipidemia   . History of chronic sinusitis    Current Outpatient Prescriptions on File Prior to Visit  Medication Sig Dispense Refill  . acetaminophen (TYLENOL) 500 MG tablet Take 1,000 mg by mouth every 6 (six) hours as needed for moderate pain or headache.    Marland Kitchen aspirin EC 81 MG tablet Take 81 mg by mouth daily.    Marland Kitchen atorvastatin (LIPITOR) 40 MG tablet Take 40 mg  by mouth daily.    Marland Kitchen BESIVANCE 0.6 % SUSP Place 1 drop into the right eye 4 (four) times daily. The day before and the day of the dr visit    . Cholecalciferol (VITAMIN D) 2000 UNITS CAPS Take 2,000 Units by mouth daily.    . DONNATAL 16.2 MG tablet 16.2 tablets daily.    Marland Kitchen escitalopram (LEXAPRO) 20 MG tablet     . fenofibrate micronized (ANTARA) 130 MG capsule Take 130 mg by mouth daily.  1  . fluticasone (FLONASE) 50 MCG/ACT nasal spray Place 2 sprays into the nose daily.    . hydrochlorothiazide (HYDRODIURIL) 25 MG tablet Take 25 mg by mouth daily.    Marland Kitchen ibuprofen (ADVIL,MOTRIN) 600 MG tablet Take 1 tablet (600 mg total) by mouth every 8 (eight) hours as needed. 15 tablet 0  . loperamide (IMODIUM) 2 MG capsule Take 2-4 mg by mouth 4 (four) times daily as needed for diarrhea or loose stools. For upset stomach    . meclizine (ANTIVERT) 25 MG tablet Take 25 mg by mouth daily as needed for dizziness.     . mometasone (ASMANEX) 220 MCG/INH inhaler Inhale 2 puffs into the lungs 2 (two) times daily.     . montelukast (SINGULAIR) 10 MG tablet Take 10 mg by mouth daily.     Marland Kitchen  Multiple Vitamin (MULTIVITAMIN WITH MINERALS) TABS Take 1 tablet by mouth daily.    . Multiple Vitamins-Minerals (ICAPS AREDS 2 PO) Take 1 tablet by mouth 2 (two) times daily.    . ranitidine (ZANTAC) 300 MG tablet Take 300 mg by mouth 2 (two) times daily.    . solifenacin (VESICARE) 10 MG tablet Take 10 mg by mouth daily.     . tamsulosin (FLOMAX) 0.4 MG CAPS capsule Take 1 capsule (0.4 mg total) by mouth daily after breakfast. 30 capsule 2  . hydrALAZINE (APRESOLINE) 25 MG tablet Take 25 mg by mouth 2 (two) times daily.    Marland Kitchen losartan (COZAAR) 50 MG tablet Take 50 mg by mouth 2 (two) times daily.      No current facility-administered medications on file prior to visit.      Review of Systems   Constitutional:   No  weight loss, night sweats,  Fevers, chills, + fatigue, or  lassitude.  HEENT:   No headaches,  Difficulty  swallowing,  Tooth/dental problems, or  Sore throat,                No sneezing, itching, ear ache, + nasal congestion, post nasal drip,   CV:  No chest pain,  Orthopnea, PND, swelling in lower extremities, anasarca, dizziness, palpitations, syncope.   GI  No heartburn, indigestion, abdominal pain, nausea, vomiting, diarrhea, change in bowel habits, loss of appetite, bloody stools.   Resp:   No chest wall deformity  Skin: no rash or lesions.  GU: no dysuria, change in color of urine, no urgency or frequency.  No flank pain, no hematuria   MS:  No joint pain or swelling.  No decreased range of motion.  No back pain.  Psych:  No change in mood or affect. No depression or anxiety.  No memory loss.          Objective:   Physical Exam  GEN: A/Ox3; pleasant , NAD, thin and frail   HEENT:  [place]/AT,  EACs-clear, TMs-wnl, NOSE-clear, THROAT-clear, no lesions, no postnasal drip or exudate noted.   NECK:  Supple w/ fair ROM; no JVD; normal carotid impulses w/o bruits; no thyromegaly or nodules palpated; no lymphadenopathy.  RESP  Decreased BS in bases .no accessory muscle use, no dullness to percussion  CARD:  RRR, no m/r/g  , no peripheral edema, pulses intact, no cyanosis or clubbing.  GI:   Soft & nt; nml bowel sounds; no organomegaly or masses detected.  Musco: Warm bil, no deformities or joint swelling noted.   Neuro: alert, no focal deficits noted.    Skin: Warm, no lesions or rashes    CXR 03/13/15  Marked improvement since the February 09, 2015 chest x-ray. Slight further improvement in the retrosternal infiltrate on the right since March 03, 2015. Complete clearing has not occurred.      Assessment & Plan:

## 2015-03-26 NOTE — Assessment & Plan Note (Addendum)
Slowly resolving with gradual improvement on CXR  Will need follow up cxr on return to document total clearance Suspect she has underlying COPD w/ slow to resolve flare , need PFT on return  Trial of symbicort  Plan   Begin Symbicort 160/4.29mcg 2 puffs Twice daily  Until sample is done.  Hold Asmanex for until sample is completed then restart.  Mucinex DM Twice daily  As needed  Cough/congestion  follow up Dr. Halford Chessman  In Feb with PFT and chest xray .  Please contact office for sooner follow up if symptoms do not improve or worsen or seek emergency care

## 2015-03-30 DIAGNOSIS — N939 Abnormal uterine and vaginal bleeding, unspecified: Secondary | ICD-10-CM | POA: Diagnosis not present

## 2015-03-30 DIAGNOSIS — I10 Essential (primary) hypertension: Secondary | ICD-10-CM | POA: Diagnosis not present

## 2015-03-30 DIAGNOSIS — J449 Chronic obstructive pulmonary disease, unspecified: Secondary | ICD-10-CM | POA: Diagnosis not present

## 2015-03-30 DIAGNOSIS — K589 Irritable bowel syndrome without diarrhea: Secondary | ICD-10-CM | POA: Diagnosis not present

## 2015-03-30 DIAGNOSIS — N200 Calculus of kidney: Secondary | ICD-10-CM | POA: Diagnosis not present

## 2015-03-30 DIAGNOSIS — F329 Major depressive disorder, single episode, unspecified: Secondary | ICD-10-CM | POA: Diagnosis not present

## 2015-04-02 DIAGNOSIS — F329 Major depressive disorder, single episode, unspecified: Secondary | ICD-10-CM | POA: Diagnosis not present

## 2015-04-02 DIAGNOSIS — I10 Essential (primary) hypertension: Secondary | ICD-10-CM | POA: Diagnosis not present

## 2015-04-02 DIAGNOSIS — K589 Irritable bowel syndrome without diarrhea: Secondary | ICD-10-CM | POA: Diagnosis not present

## 2015-04-02 DIAGNOSIS — N200 Calculus of kidney: Secondary | ICD-10-CM | POA: Diagnosis not present

## 2015-04-02 DIAGNOSIS — N939 Abnormal uterine and vaginal bleeding, unspecified: Secondary | ICD-10-CM | POA: Diagnosis not present

## 2015-04-02 DIAGNOSIS — J449 Chronic obstructive pulmonary disease, unspecified: Secondary | ICD-10-CM | POA: Diagnosis not present

## 2015-04-28 DIAGNOSIS — R112 Nausea with vomiting, unspecified: Secondary | ICD-10-CM | POA: Diagnosis not present

## 2015-04-28 DIAGNOSIS — R55 Syncope and collapse: Secondary | ICD-10-CM | POA: Diagnosis not present

## 2015-04-28 DIAGNOSIS — R42 Dizziness and giddiness: Secondary | ICD-10-CM | POA: Diagnosis not present

## 2015-05-20 ENCOUNTER — Encounter (INDEPENDENT_AMBULATORY_CARE_PROVIDER_SITE_OTHER): Payer: Medicare Other | Admitting: Ophthalmology

## 2015-05-20 DIAGNOSIS — H43813 Vitreous degeneration, bilateral: Secondary | ICD-10-CM | POA: Diagnosis not present

## 2015-05-20 DIAGNOSIS — H353124 Nonexudative age-related macular degeneration, left eye, advanced atrophic with subfoveal involvement: Secondary | ICD-10-CM | POA: Diagnosis not present

## 2015-05-20 DIAGNOSIS — I10 Essential (primary) hypertension: Secondary | ICD-10-CM

## 2015-05-20 DIAGNOSIS — H353211 Exudative age-related macular degeneration, right eye, with active choroidal neovascularization: Secondary | ICD-10-CM

## 2015-05-20 DIAGNOSIS — H35033 Hypertensive retinopathy, bilateral: Secondary | ICD-10-CM

## 2015-05-22 ENCOUNTER — Encounter: Payer: Self-pay | Admitting: Pulmonary Disease

## 2015-05-22 ENCOUNTER — Ambulatory Visit (INDEPENDENT_AMBULATORY_CARE_PROVIDER_SITE_OTHER): Payer: Medicare Other | Admitting: Pulmonary Disease

## 2015-05-22 VITALS — BP 122/80 | HR 71 | Ht 62.0 in | Wt 108.4 lb

## 2015-05-22 DIAGNOSIS — J432 Centrilobular emphysema: Secondary | ICD-10-CM | POA: Diagnosis not present

## 2015-05-22 DIAGNOSIS — J449 Chronic obstructive pulmonary disease, unspecified: Secondary | ICD-10-CM

## 2015-05-22 DIAGNOSIS — J45909 Unspecified asthma, uncomplicated: Secondary | ICD-10-CM | POA: Diagnosis not present

## 2015-05-22 DIAGNOSIS — Z23 Encounter for immunization: Secondary | ICD-10-CM | POA: Diagnosis not present

## 2015-05-22 DIAGNOSIS — J439 Emphysema, unspecified: Secondary | ICD-10-CM | POA: Insufficient documentation

## 2015-05-22 DIAGNOSIS — J189 Pneumonia, unspecified organism: Secondary | ICD-10-CM

## 2015-05-22 LAB — PULMONARY FUNCTION TEST
DL/VA % PRED: 60 %
DL/VA: 2.74 ml/min/mmHg/L
DLCO COR: 9.77 ml/min/mmHg
DLCO cor % pred: 45 %
DLCO unc % pred: 46 %
DLCO unc: 10.06 ml/min/mmHg
FEF 25-75 Post: 0.83 L/sec
FEF 25-75 Pre: 0.84 L/sec
FEF2575-%CHANGE-POST: 0 %
FEF2575-%Pred-Post: 51 %
FEF2575-%Pred-Pre: 52 %
FEV1-%CHANGE-POST: 2 %
FEV1-%Pred-Post: 78 %
FEV1-%Pred-Pre: 76 %
FEV1-POST: 1.54 L
FEV1-Pre: 1.5 L
FEV1FVC-%CHANGE-POST: 18 %
FEV1FVC-%Pred-Pre: 78 %
FEV6-%Change-Post: -13 %
FEV6-%PRED-PRE: 102 %
FEV6-%Pred-Post: 89 %
FEV6-PRE: 2.55 L
FEV6-Post: 2.21 L
FEV6FVC-%PRED-PRE: 105 %
FEV6FVC-%Pred-Post: 105 %
FVC-%Change-Post: -13 %
FVC-%PRED-POST: 84 %
FVC-%PRED-PRE: 97 %
FVC-POST: 2.21 L
FVC-Pre: 2.55 L
POST FEV1/FVC RATIO: 70 %
POST FEV6/FVC RATIO: 100 %
PRE FEV6/FVC RATIO: 100 %
Pre FEV1/FVC ratio: 59 %
RV % PRED: 192 %
RV: 4.17 L
TLC % PRED: 130 %
TLC: 6.22 L

## 2015-05-22 MED ORDER — MOMETASONE FURO-FORMOTEROL FUM 100-5 MCG/ACT IN AERO: 2.0000 | INHALATION_SPRAY | Freq: Two times a day (BID) | RESPIRATORY_TRACT | Status: DC

## 2015-05-22 NOTE — Progress Notes (Signed)
PFT done today. 

## 2015-05-22 NOTE — Addendum Note (Signed)
Addended by: Virl Cagey on: 05/22/2015 02:57 PM   Modules accepted: Orders

## 2015-05-22 NOTE — Progress Notes (Signed)
Current Outpatient Prescriptions on File Prior to Visit  Medication Sig  . acetaminophen (TYLENOL) 500 MG tablet Take 1,000 mg by mouth every 6 (six) hours as needed for moderate pain or headache.  Marland Kitchen aspirin EC 81 MG tablet Take 81 mg by mouth daily.  Marland Kitchen atorvastatin (LIPITOR) 40 MG tablet Take 40 mg by mouth daily.  Marland Kitchen BESIVANCE 0.6 % SUSP Place 1 drop into the right eye 4 (four) times daily. The day before and the day of the dr visit  . Cholecalciferol (VITAMIN D) 2000 UNITS CAPS Take 2,000 Units by mouth daily.  . DONNATAL 16.2 MG tablet 16.2 tablets daily.  Marland Kitchen escitalopram (LEXAPRO) 20 MG tablet   . fenofibrate micronized (ANTARA) 130 MG capsule Take 130 mg by mouth daily.  . fluticasone (FLONASE) 50 MCG/ACT nasal spray Place 2 sprays into the nose daily.  . hydrochlorothiazide (HYDRODIURIL) 25 MG tablet Take 25 mg by mouth daily.  Marland Kitchen ibuprofen (ADVIL,MOTRIN) 600 MG tablet Take 1 tablet (600 mg total) by mouth every 8 (eight) hours as needed.  . loperamide (IMODIUM) 2 MG capsule Take 2-4 mg by mouth 4 (four) times daily as needed for diarrhea or loose stools. For upset stomach  . meclizine (ANTIVERT) 25 MG tablet Take 25 mg by mouth daily as needed for dizziness.   . montelukast (SINGULAIR) 10 MG tablet Take 10 mg by mouth daily.   . Multiple Vitamin (MULTIVITAMIN WITH MINERALS) TABS Take 1 tablet by mouth daily.  . Multiple Vitamins-Minerals (ICAPS AREDS 2 PO) Take 1 tablet by mouth 2 (two) times daily.  . ranitidine (ZANTAC) 300 MG tablet Take 300 mg by mouth 2 (two) times daily.  . solifenacin (VESICARE) 10 MG tablet Take 10 mg by mouth daily.   . tamsulosin (FLOMAX) 0.4 MG CAPS capsule Take 1 capsule (0.4 mg total) by mouth daily after breakfast.   No current facility-administered medications on file prior to visit.     Chief Complaint  Patient presents with  . Follow-up    pt. states breathing is baseline. feels she gasps for air at time. denies wheezing, SOB or chest  pain/tightness.     Tests CT chest 02/05/15 >> moderate emphysema PFT 05/22/15 >> FEV1 1.54 (78%), FEV1% 70, TLC 6.22 (130%), DLCO 46%, no BD  Past medical hx HTN, Depression, IBS, Tinnitus, Anxiety, Nephrolithiasis, GERD, HLD  Past surgical hx, Allergies, Family hx, Social hx all reviewed.  Vital Signs BP 122/80 mmHg  Pulse 71  Ht 5\' 2"  (1.575 m)  Wt 108 lb 6.4 oz (49.17 kg)  BMI 19.82 kg/m2  SpO2 96%  History of Present Illness Gina Fritz is a 76 y.o. female former smoker with COPD/emphysema and asthma.  She still feels weak.  She is getting episodes of cough with clear sputum, and occasional wheeze.  She feels singulair has helped her sinuses and cough some.  She is not sure if symbicort helped from last visit, but she was still feeling very sick from pneumonia.  She has not smoked since November 2016.  PFT today showed mild obstruction, air trapping, and moderate to severe diffusion defect.  Physical Exam  General - No distress, thin ENT - No sinus tenderness, no oral exudate, no LAN Cardiac - s1s2 regular, no murmur Chest - No wheeze/rales/dullness Back - No focal tenderness Abd - Soft, non-tender Ext - No edema Neuro - Normal strength Skin - No rashes Psych - normal mood, and behavior   Assessment/Plan  COPD with emphysema and asthma. Plan: -  will add dulera in place of asmanex - will continue singulair - will arrange for referral to pulmonary rehab - influenza and pneumovax today    Patient Instructions  Dulera two puffs twice per day >> rinse mouth after each use Stop asmanex while using dulera Will refer to pulmonary rehab Flu shot today Will check if you need pneumovax shot Follow up in 2 months with Dr. Halford Chessman or Nurse Practitioner     Gina Mires, MD [place] Pager:  [phone]

## 2015-05-22 NOTE — Patient Instructions (Signed)
Dulera two puffs twice per day >> rinse mouth after each use Stop asmanex while using dulera Will refer to pulmonary rehab Flu shot today Will check if you need pneumovax shot Follow up in 2 months with Dr. Halford Chessman or Nurse Practitioner

## 2015-06-05 ENCOUNTER — Telehealth (HOSPITAL_COMMUNITY): Payer: Self-pay

## 2015-06-16 ENCOUNTER — Ambulatory Visit (INDEPENDENT_AMBULATORY_CARE_PROVIDER_SITE_OTHER)
Admission: RE | Admit: 2015-06-16 | Discharge: 2015-06-16 | Disposition: A | Discharge status: Home / Self Care | Payer: Medicare Other | Source: Ambulatory Visit | Attending: Pulmonary Disease | Admitting: Pulmonary Disease

## 2015-06-16 ENCOUNTER — Encounter: Payer: Self-pay | Admitting: Pulmonary Disease

## 2015-06-16 ENCOUNTER — Other Ambulatory Visit (INDEPENDENT_AMBULATORY_CARE_PROVIDER_SITE_OTHER): Payer: Medicare Other

## 2015-06-16 ENCOUNTER — Ambulatory Visit (INDEPENDENT_AMBULATORY_CARE_PROVIDER_SITE_OTHER): Payer: Medicare Other | Admitting: Pulmonary Disease

## 2015-06-16 VITALS — BP 112/70 | HR 65 | Ht 62.0 in | Wt 109.0 lb

## 2015-06-16 DIAGNOSIS — J449 Chronic obstructive pulmonary disease, unspecified: Secondary | ICD-10-CM

## 2015-06-16 DIAGNOSIS — J45909 Unspecified asthma, uncomplicated: Secondary | ICD-10-CM | POA: Diagnosis not present

## 2015-06-16 DIAGNOSIS — R05 Cough: Secondary | ICD-10-CM | POA: Diagnosis not present

## 2015-06-16 DIAGNOSIS — R071 Chest pain on breathing: Secondary | ICD-10-CM

## 2015-06-16 DIAGNOSIS — J432 Centrilobular emphysema: Secondary | ICD-10-CM

## 2015-06-16 LAB — CBC WITH DIFFERENTIAL/PLATELET
BASOS ABS: 0 10*3/uL (ref 0.0–0.1)
Basophils Relative: 0.9 % (ref 0.0–3.0)
EOS ABS: 0.3 10*3/uL (ref 0.0–0.7)
Eosinophils Relative: 8.1 % — ABNORMAL HIGH (ref 0.0–5.0)
HEMATOCRIT: 38.1 % (ref 36.0–46.0)
HEMOGLOBIN: 12.8 g/dL (ref 12.0–15.0)
LYMPHS PCT: 28.1 % (ref 12.0–46.0)
Lymphs Abs: 1.1 10*3/uL (ref 0.7–4.0)
MCHC: 33.6 g/dL (ref 30.0–36.0)
MCV: 82.8 fl (ref 78.0–100.0)
MONOS PCT: 14.4 % — AB (ref 3.0–12.0)
Monocytes Absolute: 0.6 10*3/uL (ref 0.1–1.0)
NEUTROS ABS: 2 10*3/uL (ref 1.4–7.7)
Neutrophils Relative %: 48.5 % (ref 43.0–77.0)
PLATELETS: 236 10*3/uL (ref 150.0–400.0)
RBC: 4.6 Mil/uL (ref 3.87–5.11)
RDW: 14.3 % (ref 11.5–15.5)
WBC: 4.1 10*3/uL (ref 4.0–10.5)

## 2015-06-16 LAB — COMPREHENSIVE METABOLIC PANEL
ALK PHOS: 177 U/L — AB (ref 39–117)
ALT: 34 U/L (ref 0–35)
AST: 31 U/L (ref 0–37)
Albumin: 3.9 g/dL (ref 3.5–5.2)
BILIRUBIN TOTAL: 0.5 mg/dL (ref 0.2–1.2)
BUN: 15 mg/dL (ref 6–23)
CALCIUM: 9.4 mg/dL (ref 8.4–10.5)
CHLORIDE: 103 meq/L (ref 96–112)
CO2: 29 mEq/L (ref 19–32)
CREATININE: 0.75 mg/dL (ref 0.40–1.20)
GFR: 80.3 mL/min (ref >60.00)
Glucose, Bld: 95 mg/dL (ref 70–99)
Potassium: 4.1 mEq/L (ref 3.5–5.1)
Sodium: 140 mEq/L (ref 135–145)
TOTAL PROTEIN: 7.1 g/dL (ref 6.0–8.3)

## 2015-06-16 MED ORDER — ALBUTEROL SULFATE HFA 108 (90 BASE) MCG/ACT IN AERS: 2.0000 | INHALATION_SPRAY | Freq: Four times a day (QID) | RESPIRATORY_TRACT | Status: DC | PRN

## 2015-06-16 NOTE — Patient Instructions (Signed)
Chest xray and lab tests today  Albuterol two puffs every four to six hours as needed for cough, wheeze, or chest congestion  Follow up in April with Tammy Parrett as previously scheduled

## 2015-06-16 NOTE — Progress Notes (Signed)
Current Outpatient Prescriptions on File Prior to Visit  Medication Sig  . acetaminophen (TYLENOL) 500 MG tablet Take 1,000 mg by mouth every 6 (six) hours as needed for moderate pain or headache.  Marland Kitchen aspirin EC 81 MG tablet Take 81 mg by mouth daily.  Marland Kitchen atorvastatin (LIPITOR) 40 MG tablet Take 40 mg by mouth daily.  Marland Kitchen BESIVANCE 0.6 % SUSP Place 1 drop into the right eye 4 (four) times daily. The day before and the day of the dr visit  . Cholecalciferol (VITAMIN D) 2000 UNITS CAPS Take 2,000 Units by mouth daily.  . DONNATAL 16.2 MG tablet 16.2 tablets daily.  Marland Kitchen escitalopram (LEXAPRO) 20 MG tablet   . fenofibrate micronized (ANTARA) 130 MG capsule Take 130 mg by mouth daily.  . fluticasone (FLONASE) 50 MCG/ACT nasal spray Place 2 sprays into the nose daily.  Marland Kitchen ibuprofen (ADVIL,MOTRIN) 600 MG tablet Take 1 tablet (600 mg total) by mouth every 8 (eight) hours as needed.  . loperamide (IMODIUM) 2 MG capsule Take 2-4 mg by mouth 4 (four) times daily as needed for diarrhea or loose stools. For upset stomach  . montelukast (SINGULAIR) 10 MG tablet Take 10 mg by mouth daily.   . Multiple Vitamin (MULTIVITAMIN WITH MINERALS) TABS Take 1 tablet by mouth daily.  . Multiple Vitamins-Minerals (ICAPS AREDS 2 PO) Take 1 tablet by mouth 2 (two) times daily.  . ranitidine (ZANTAC) 300 MG tablet Take 300 mg by mouth 2 (two) times daily.  . solifenacin (VESICARE) 10 MG tablet Take 10 mg by mouth daily.   . tamsulosin (FLOMAX) 0.4 MG CAPS capsule Take 1 capsule (0.4 mg total) by mouth daily after breakfast.  . mometasone-formoterol (DULERA) 100-5 MCG/ACT AERO Inhale 2 puffs into the lungs 2 (two) times daily. (Patient not taking: Reported on 06/16/2015)   No current facility-administered medications on file prior to visit.     Chief Complaint  Patient presents with  . Follow-up    Pt c/o increased right sided chest pain- progressed to right shoulder blade. Pt told by PCP to take Prilosec for possible  indigestion, pt notes it helped some. Pt reports some severity in pain at times.  Notes some arm pain.     Tests CT chest 02/05/15 >> moderate emphysema PFT 05/22/15 >> FEV1 1.54 (78%), FEV1% 70, TLC 6.22 (130%), DLCO 46%, no BD  Past medical hx HTN, Depression, IBS, Tinnitus, Anxiety, Nephrolithiasis, GERD, HLD  Past surgical hx, Allergies, Family hx, Social hx all reviewed.  Vital Signs BP 112/70 mmHg  Pulse 65  Ht 5\' 2"  (1.575 m)  Wt 109 lb (49.442 kg)  BMI 19.93 kg/m2  SpO2 95%  History of Present Illness Gina Fritz is a 75 y.o. female former smoker with COPD/emphysema and asthma.  She was doing well until about 5 days ago.  She has developed pain in her right chest.  This will go to her right arm.  It has been persistent for days.  She has been getting a cough, and occasionally brings up clear sputum.  She tried prilosec >> not much help.  She denies fever, rash, palpitations, sore throat, or hemoptysis.   Physical Exam  General - No distress, thin ENT - No sinus tenderness, no oral exudate, no LAN Cardiac - s1s2 regular, no murmur Chest - No wheeze/rales/dullness Back - No focal tenderness Abd - Soft, non-tender Ext - No edema Neuro - Normal strength Skin - No rashes Psych - normal mood, and behavior   Assessment/Plan  Chest pain. No response to PPI.  Doesn't sound cardiac.  No rash to suggest shingles.  No tenderness on palpation. Plan: - chest xray, CBC diff, CMET, D dimer - depending on results might need CT angiogram chest  COPD with emphysema and asthma. Plan: - she will change from asmanex to dulera when her current script for asmanex runs out - will get her an albuterol inhaler - will continue singulair - she will schedule appt with pulmonary rehab    Patient Instructions  Chest xray and lab tests today  Albuterol two puffs every four to six hours as needed for cough, wheeze, or chest congestion  Follow up in April with Tammy Parrett  as previously scheduled     Chesley Mires, MD [facility] Pager:  [phone]

## 2015-06-17 ENCOUNTER — Telehealth: Payer: Self-pay | Admitting: Pulmonary Disease

## 2015-06-17 LAB — D-DIMER, QUANTITATIVE: D-Dimer, Quant: 0.29 ug/mL-FEU (ref 0.00–0.48)

## 2015-06-17 NOTE — Telephone Encounter (Signed)
Dg Chest 2 View  06/16/2015  CLINICAL DATA:  Chest pain while breathing for 3 days, some cough, former smoker, COPD/emphysema, hypertension, history pulmonary nodules, hyperlipidemia EXAM: CHEST  2 VIEW COMPARISON:  03/13/2015 FINDINGS: Borderline enlargement of cardiac silhouette. Atherosclerotic calcification aorta. Mediastinal contours and pulmonary vascularity normal. Emphysematous and minimal bronchitic changes consistent with COPD. Biapical scarring and chronic accentuation of RIGHT middle lobe markings, stable. No acute infiltrate, pleural effusion or pneumothorax. No definite focal pulmonary nodule identified. Bones appear demineralized. IMPRESSION: COPD changes without acute infiltrate. Electronically Signed   By: Lavonia Dana M.D.   On: 06/16/2015 15:22    CMP Latest Ref Rng 06/16/2015 02/09/2015 02/08/2015  Glucose 70 - 99 mg/dL 95 116(H) 124(H)  BUN 6 - 23 mg/dL 15 <5(L) 5(L)  Creatinine 0.40 - 1.20 mg/dL 0.75 0.72 0.59  Sodium 135 - 145 mEq/L 140 137 141  Potassium 3.5 - 5.1 mEq/L 4.1 4.2 3.1(L)  Chloride 96 - 112 mEq/L 103 106 107  CO2 19 - 32 mEq/L 29 24 25   Calcium 8.4 - 10.5 mg/dL 9.4 8.8(L) 8.8(L)  Total Protein 6.0 - 8.3 g/dL 7.1 5.7(L) -  Total Bilirubin 0.2 - 1.2 mg/dL 0.5 0.5 -  Alkaline Phos 39 - 117 U/L 177(H) 132(H) -  AST 0 - 37 U/L 31 37 -  ALT 0 - 35 U/L 34 29 -     CBC Latest Ref Rng 06/16/2015 02/09/2015 02/08/2015  WBC 4.0 - 10.5 K/uL 4.1 5.6 5.3  Hemoglobin 12.0 - 15.0 g/dL 12.8 9.7(L) 10.1(L)  Hematocrit 36.0 - 46.0 % 38.1 30.7(L) 32.0(L)  Platelets 150.0 - 400.0 K/uL 236.0 248 235     Lab Results  Component Value Date   DDIMER 0.29 06/16/2015    Please inform patient that chest xray showed expected changes from COPD, but no findings to explain her pain symptoms.  Her lab tests were normal.

## 2015-06-17 NOTE — Telephone Encounter (Signed)
LVM for pt to return call

## 2015-06-17 NOTE — Telephone Encounter (Signed)
Called and spoke with pt. She was calling to see if her lab results have been received. I explained to her that we have not received VS's recs but once we do his nurse will return her call. She voiced understanding and had no further questions.   VS please advise on lab results

## 2015-06-18 NOTE — Telephone Encounter (Signed)
Results have been explained to patient, pt expressed understanding. Nothing further needed.  

## 2015-06-22 ENCOUNTER — Telehealth: Payer: Self-pay | Admitting: *Deleted

## 2015-06-22 DIAGNOSIS — N200 Calculus of kidney: Secondary | ICD-10-CM | POA: Diagnosis not present

## 2015-06-22 DIAGNOSIS — Z0000 Encounter for general adult medical examination without abnormal findings: Secondary | ICD-10-CM | POA: Diagnosis not present

## 2015-06-22 NOTE — Telephone Encounter (Signed)
Okay to switch to preferred - Proair HFA

## 2015-06-22 NOTE — Telephone Encounter (Signed)
Received PA request for Ventolin HFA. Insurance states "must use Proair HFA or Proair Respiclick." please advise if you want to switch or proceed with PA for ventolin HFA. thanks

## 2015-06-23 MED ORDER — ALBUTEROL SULFATE HFA 108 (90 BASE) MCG/ACT IN AERS: 2.0000 | INHALATION_SPRAY | Freq: Four times a day (QID) | RESPIRATORY_TRACT | Status: AC | PRN

## 2015-06-23 NOTE — Telephone Encounter (Signed)
Spoke with pt. She is aware of medication change. Rx has been sent in. Nothing further was needed. 

## 2015-06-24 ENCOUNTER — Encounter (INDEPENDENT_AMBULATORY_CARE_PROVIDER_SITE_OTHER): Payer: Medicare Other | Admitting: Ophthalmology

## 2015-06-24 DIAGNOSIS — I10 Essential (primary) hypertension: Secondary | ICD-10-CM

## 2015-06-24 DIAGNOSIS — H353211 Exudative age-related macular degeneration, right eye, with active choroidal neovascularization: Secondary | ICD-10-CM

## 2015-06-24 DIAGNOSIS — H43813 Vitreous degeneration, bilateral: Secondary | ICD-10-CM

## 2015-06-24 DIAGNOSIS — H353122 Nonexudative age-related macular degeneration, left eye, intermediate dry stage: Secondary | ICD-10-CM

## 2015-06-24 DIAGNOSIS — H35033 Hypertensive retinopathy, bilateral: Secondary | ICD-10-CM | POA: Diagnosis not present

## 2015-07-06 ENCOUNTER — Encounter (HOSPITAL_COMMUNITY)
Admission: RE | Admit: 2015-07-06 | Discharge: 2015-07-06 | Disposition: A | Discharge status: Home / Self Care | Payer: Medicare Other | Source: Ambulatory Visit | Attending: Pulmonary Disease | Admitting: Pulmonary Disease

## 2015-07-06 ENCOUNTER — Encounter (HOSPITAL_COMMUNITY): Payer: Self-pay

## 2015-07-06 VITALS — BP 132/75 | HR 61 | Ht 62.5 in | Wt 111.1 lb

## 2015-07-06 DIAGNOSIS — J439 Emphysema, unspecified: Secondary | ICD-10-CM | POA: Insufficient documentation

## 2015-07-06 NOTE — Progress Notes (Signed)
Pulmonary Individual Treatment Plan  Patient Details  Name: Gina Fritz MRN: [id number] Date of Birth: 31-Aug-1940 Referring Provider:  Stephens Shire, MD  Initial Encounter Date:   Visit Diagnosis: Pulmonary emphysema, unspecified emphysema type ([place])  Patient's Home Medications on Admission:   Current outpatient prescriptions:  .  acetaminophen (TYLENOL) 500 MG tablet, Take 1,000 mg by mouth every 6 (six) hours as needed for moderate pain or headache., Disp: , Rfl:  .  albuterol (PROVENTIL HFA;VENTOLIN HFA) 108 (90 Base) MCG/ACT inhaler, Inhale 2 puffs into the lungs every 6 (six) hours as needed for wheezing or shortness of breath., Disp: 1 Inhaler, Rfl: 6 .  aspirin EC 81 MG tablet, Take 81 mg by mouth daily., Disp: , Rfl:  .  atorvastatin (LIPITOR) 40 MG tablet, Take 40 mg by mouth daily., Disp: , Rfl:  .  BESIVANCE 0.6 % SUSP, Place 1 drop into the right eye 4 (four) times daily. The day before and the day of the dr visit, Disp: , Rfl:  .  Cholecalciferol (VITAMIN D) 2000 UNITS CAPS, Take 2,000 Units by mouth daily., Disp: , Rfl:  .  DONNATAL 16.2 MG tablet, 16.2 tablets daily., Disp: , Rfl:  .  escitalopram (LEXAPRO) 20 MG tablet, , Disp: , Rfl:  .  fluticasone (FLONASE) 50 MCG/ACT nasal spray, Place 2 sprays into the nose daily., Disp: , Rfl:  .  ibuprofen (ADVIL,MOTRIN) 600 MG tablet, Take 1 tablet (600 mg total) by mouth every 8 (eight) hours as needed., Disp: 15 tablet, Rfl: 0 .  loperamide (IMODIUM) 2 MG capsule, Take 2-4 mg by mouth 4 (four) times daily as needed for diarrhea or loose stools. For upset stomach, Disp: , Rfl:  .  mometasone-formoterol (DULERA) 100-5 MCG/ACT AERO, Inhale 2 puffs into the lungs 2 (two) times daily., Disp: 1 Inhaler, Rfl: 5 .  montelukast (SINGULAIR) 10 MG tablet, Take 10 mg by mouth daily. , Disp: , Rfl:  .  Multiple Vitamin (MULTIVITAMIN WITH MINERALS) TABS, Take 1 tablet by mouth daily., Disp: , Rfl:  .  Multiple Vitamins-Minerals (ICAPS  AREDS 2 PO), Take 1 tablet by mouth 2 (two) times daily., Disp: , Rfl:  .  ranitidine (ZANTAC) 300 MG tablet, Take 300 mg by mouth 2 (two) times daily., Disp: , Rfl:  .  solifenacin (VESICARE) 10 MG tablet, Take 10 mg by mouth daily. , Disp: , Rfl:  .  albuterol (PROAIR HFA) 108 (90 Base) MCG/ACT inhaler, Inhale 2 puffs into the lungs every 6 (six) hours as needed for wheezing or shortness of breath. (Patient not taking: Reported on 07/06/2015), Disp: 1 Inhaler, Rfl: 2 .  fenofibrate micronized (ANTARA) 130 MG capsule, Take 130 mg by mouth daily. Reported on 07/06/2015, Disp: , Rfl: 1 .  mometasone (ASMANEX) 220 MCG/INH inhaler, Inhale 2 puffs into the lungs daily. Reported on 07/06/2015, Disp: , Rfl:  .  tamsulosin (FLOMAX) 0.4 MG CAPS capsule, Take 1 capsule (0.4 mg total) by mouth daily after breakfast., Disp: 30 capsule, Rfl: 2  Past Medical History: Past Medical History  Diagnosis Date  . Hypertension   . Depression   . Allergic rhinitis   . Rash   . Irritable bowel syndrome   . Sensorineural hearing loss, unilateral   . Subjective tinnitus   . Loss of weight   . Dizziness   . COPD with emphysema ([place])   . Anxiety   . Right ureteral stone   . History of kidney stones   . Colitis  dx 02-05-2015  . CAP (community acquired pneumonia)     dx 02-05-2015  . Pulmonary nodules     benign and stable per CT  . GERD (gastroesophageal reflux disease)   . Hyperlipidemia   . History of chronic sinusitis     Tobacco Use: History  Smoking status  . Former Smoker -- 1.00 packs/day for 25 years  . Types: Cigarettes  . Quit date: 02/05/2015  Smokeless tobacco  . Never Used    Labs:     Recent Review Flowsheet Data    There is no flowsheet data to display.      Capillary Blood Glucose: Lab Results  Component Value Date   GLUCAP 112* 02/09/2015     ADL [facility]:     Pulmonary Assessment Scores      07/08/15 1027       ADL [facility]   SOB Score total 72        Pulmonary  Function Assessment:     Pulmonary Function Assessment - 07/06/15 1029    Breath   Bilateral Breath Sounds Clear   Shortness of Breath Yes;Limiting activity      Exercise Target Goals:    Exercise Program Goal: Individual exercise prescription set with THRR, safety & activity barriers. Participant demonstrates ability to understand and report RPE using BORG scale, to self-measure pulse accurately, and to acknowledge the importance of the exercise prescription.  Exercise Prescription Goal: Starting with aerobic activity 30 plus minutes a day, 3 days per week for initial exercise prescription. Provide home exercise prescription and guidelines that participant acknowledges understanding prior to discharge.  Activity Barriers & Risk Stratification:     Activity Barriers & Cardiac Risk Stratification - 07/06/15 1026    Activity Barriers & Cardiac Risk Stratification   Activity Barriers Deconditioning;Muscular Weakness;Shortness of Breath      6 Minute Walk:     6 Minute Walk      07/09/15 0758       6 Minute Walk   Phase Initial     Distance 678 feet     Walk Time 6 minutes     # of Rest Breaks 0     MPH 1.28     METS 1.73     RPE 7     Perceived Dyspnea  0     VO2 Peak 6.08     Symptoms No     Resting HR 80 bpm     Resting BP 138/78 mmHg     Max Ex. HR 96 bpm     Max Ex. BP 120/80 mmHg     2 Minute Post BP 120/80 mmHg     Interval HR   Baseline HR 80     1 Minute HR 83     2 Minute HR 81     3 Minute HR 82     4 Minute HR 96     5 Minute HR 90     6 Minute HR 91     2 Minute Post HR 65     Interval Heart Rate? Yes     Interval Oxygen   Interval Oxygen? Yes     Baseline Oxygen Saturation % 98 %     Baseline Liters of Oxygen 0 L     1 Minute Oxygen Saturation % 97 %     1 Minute Liters of Oxygen 0 L     2 Minute Oxygen Saturation % 97 %     2 Minute Liters  of Oxygen 0 L     3 Minute Oxygen Saturation % 96 %     3 Minute Liters of Oxygen 0 L     4  Minute Oxygen Saturation % 96 %     4 Minute Liters of Oxygen 0 L     5 Minute Oxygen Saturation % 94 %     5 Minute Liters of Oxygen 0 L     6 Minute Oxygen Saturation % 96 %     6 Minute Liters of Oxygen 0 L     2 Minute Post Oxygen Saturation % 98 %     2 Minute Post Liters of Oxygen 0 L        Initial Exercise Prescription:     Initial Exercise Prescription - 07/09/15 0800    Date of Initial Exercise Prescription   Date 07/09/15   Oxygen   Oxygen --  room air   Bike   Level 0.2   Minutes 15   NuStep   Level 1   Minutes 15   Track   Laps 5   Minutes 15   Prescription Details   Frequency (times per week) 2   Duration Progress to 45 minutes of aerobic exercise without signs/symptoms of physical distress   Intensity   THRR 40-80% of Max Heartrate 58-117   Ratings of Perceived Exertion 11-13   Perceived Dyspnea 0-4   Progression   Progression Continue to progress workloads to maintain intensity without signs/symptoms of physical distress.   Resistance Training   Training Prescription Yes   Weight orange bands   Reps 10-12      Perform Capillary Blood Glucose checks as needed.  Exercise Prescription Changes:   Exercise Comments:   Discharge Exercise Prescription (Final Exercise Prescription Changes):    Nutrition:  Target Goals: Understanding of nutrition guidelines, daily intake of sodium 1500mg , cholesterol 200mg , calories 30% from fat and 7% or less from saturated fats, daily to have 5 or more servings of fruits and vegetables.  Biometrics:     Pre Biometrics - 07/06/15 1032    Pre Biometrics   Grip Strength 20 kg       Nutrition Therapy Plan and Nutrition Goals:   Nutrition Discharge: Rate Your Plate Scores:   Psychosocial: Target Goals: Acknowledge presence or absence of depression, maximize coping skills, provide positive support system. Participant is able to verbalize types and ability to use techniques and skills needed for  reducing stress and depression.  Initial Review & Psychosocial Screening:     Initial Psych Review & Screening - 07/06/15 1044    Initial Review   Current issues with History of Depression   Family Dynamics   Good Support System? Yes   Barriers   Psychosocial barriers to participate in program There are no identifiable barriers or psychosocial needs.   Screening Interventions   Interventions Encouraged to exercise      Quality of Life Scores:     Quality of Life - 07/08/15 1020    Quality of Life Scores   Health/Function Pre 12.4 %   Socioeconomic Pre 16 %   Psych/Spiritual Pre 23.14 %   Family Pre 30 %   GLOBAL Pre 17.49 %      PHQ-9:     Recent Review Flowsheet Data    Depression screen [facility] 2/9 07/06/2015   Decreased Interest 0   Down, Depressed, Hopeless 1   PHQ - 2 Score 1      Psychosocial Evaluation and  Intervention:     Psychosocial Evaluation - 07/06/15 1044    Psychosocial Evaluation & Interventions   Interventions Relaxation education;Encouraged to exercise with the program and follow exercise prescription   Comments Continue Lexapro, begin exercising in our program   Continued Psychosocial Services Needed No      Psychosocial Re-Evaluation:  Education: Education Goals: Education classes will be provided on a weekly basis, covering required topics. Participant will state understanding/return demonstration of topics presented.  Learning Barriers/Preferences:     Learning Barriers/Preferences - 07/06/15 1026    Learning Barriers/Preferences   Learning Barriers None   Learning Preferences Written Material;Verbal Instruction;Skilled Demonstration;Audio      Education Topics: Risk Factor Reduction:  -Group instruction that is supported by a PowerPoint presentation. Instructor discusses the definition of a risk factor, different risk factors for pulmonary disease, and how the heart and lungs work together.     Nutrition for Pulmonary Patient:   -Group instruction provided by PowerPoint slides, verbal discussion, and written materials to support subject matter. The instructor gives an explanation and review of healthy diet recommendations, which includes a discussion on weight management, recommendations for fruit and vegetable consumption, as well as protein, fluid, caffeine, fiber, sodium, sugar, and alcohol. Tips for eating when patients are short of breath are discussed.   Pursed Lip Breathing:  -Group instruction that is supported by demonstration and informational handouts. Instructor discusses the benefits of pursed lip and diaphragmatic breathing and detailed demonstration on how to preform both.     Oxygen Safety:  -Group instruction provided by PowerPoint, verbal discussion, and written material to support subject matter. There is an overview of "What is Oxygen" and "Why do we need it".  Instructor also reviews how to create a safe environment for oxygen use, the importance of using oxygen as prescribed, and the risks of noncompliance. There is a brief discussion on traveling with oxygen and resources the patient may utilize.   Oxygen Equipment:  -Group instruction provided by [facility] Staff utilizing handouts, written materials, and equipment demonstrations.   Signs and Symptoms:  -Group instruction provided by written material and verbal discussion to support subject matter. Warning signs and symptoms of infection, stroke, and heart attack are reviewed and when to call the physician/911 reinforced. Tips for preventing the spread of infection discussed.   Advanced Directives:  -Group instruction provided by verbal instruction and written material to support subject matter. Instructor reviews Advanced Directive laws and proper instruction for filling out document.   Pulmonary Video:  -Group video education that reviews the importance of medication and oxygen compliance, exercise, good nutrition, pulmonary hygiene, and  pursed lip and diaphragmatic breathing for the pulmonary patient.   Exercise for the Pulmonary Patient:  -Group instruction that is supported by a PowerPoint presentation. Instructor discusses benefits of exercise, core components of exercise, frequency, duration, and intensity of an exercise routine, importance of utilizing pulse oximetry during exercise, safety while exercising, and options of places to exercise outside of rehab.     Pulmonary Medications:  -Verbally interactive group education provided by instructor with focus on inhaled medications and proper administration.   Anatomy and Physiology of the Respiratory System and Intimacy:  -Group instruction provided by PowerPoint, verbal discussion, and written material to support subject matter. Instructor reviews respiratory cycle and anatomical components of the respiratory system and their functions. Instructor also reviews differences in obstructive and restrictive respiratory diseases with examples of each. Intimacy, Sex, and Sexuality differences are reviewed with a discussion on how  relationships can change when diagnosed with pulmonary disease. Common sexual concerns are reviewed.   Knowledge Questionnaire Score:     Knowledge Questionnaire Score - 07/08/15 1019    Knowledge Questionnaire Score   Pre Score 9/13      Core Components/Risk Factors/Patient Goals at Admission:     Personal Goals and Risk Factors at Admission - 07/06/15 1032    Core Components/Risk Factors/Patient Goals on Admission   Increase Strength and Stamina Yes   Intervention Provide advice, education, support and counseling about physical activity/exercise needs.;Develop an individualized exercise prescription for aerobic and resistive training based on initial evaluation findings, risk stratification, comorbidities and participant's personal goals.   Expected Outcomes Achievement of increased cardiorespiratory fitness and enhanced flexibility, muscular  endurance and strength shown through measurements of functional capacity and personal statement of participant.   Improve shortness of breath with ADL's Yes   Intervention Provide education, individualized exercise plan and daily activity instruction to help decrease symptoms of SOB with activities of daily living.   Expected Outcomes Short Term: Achieves a reduction of symptoms when performing activities of daily living.   Intervention Provide education, demonstration and support about specific breathing techniuqes utilized for more efficient breathing. Include techniques such as pursed lipped breathing, diaphragmatic breathing and self-pacing activity.   Expected Outcomes Short Term: Participant will be able to demonstrate and use breathing techniques as needed throughout daily activities.   Personal Goal Other Yes   Personal Goal To be able to have the energy to do more house work and gardening.     Intervention Increase workloads on equipment as tolerated to increase her strength and stamina   Expected Outcomes To have the energy to perform ADL's at home.      Core Components/Risk Factors/Patient Goals Review:      Goals and Risk Factor Review      07/06/15 1043           Core Components/Risk Factors/Patient Goals Review   Personal Goals Review Increase Strength and Stamina;Develop more efficient breathing techniques such as purse lipped breathing and diaphragmatic breathing and practicing self-pacing with activity.;Improve shortness of breath with ADL's          Core Components/Risk Factors/Patient Goals at Discharge (Final Review):      Goals and Risk Factor Review - 07/06/15 1043    Core Components/Risk Factors/Patient Goals Review   Personal Goals Review Increase Strength and Stamina;Develop more efficient breathing techniques such as purse lipped breathing and diaphragmatic breathing and practicing self-pacing with activity.;Improve shortness of breath with ADL's      ITP  Comments:   Comments: Gina Fritz 75 y.o. female Pulmonary Rehab Orientation Note Patient arrived today in Cardiac and Pulmonary Rehab for orientation to Pulmonary Rehab. She was transported from [employer] via wheel chair. She does not carry portable oxygen. Per pt, she never uses oxygen.  After her hospitalization in November 2016 she was on oxygen in the hospital, but did not go home with it.   Color good, skin warm and dry. Patient is oriented to time and place. Patient's medical history, psychosocial health, and medications reviewed. Psychosocial assessment reveals pt lives with their spouse. Her spouse does almost all the cooking and she needs help with cleaning the house and is considering having someone come in the do the deep cleaning in hopes that she can do the lighter maintenance tasks.  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  She has been retired 15 years.  Pt hobbies include gardening and reading.  Unfortunately, she has not had the energy to garden since her pneumonia this past November.  She hopes pulmonary rehab will give her the strength and stamina to perform her ADL's with more ease. Pt reports her stress level is moderate. Areas of stress/anxiety include her Health.  She has the desire to do interests, but she does not have the energy to perform them.   Pt does not exhibit signs of depression. PHQ2/9 score 1/0. Pt shows fair  coping skills with positive outlook .  Offered emotional support and reassurance. Will continue to monitor and evaluate progress toward psychosocial goal(s) of gaining her strength and stamina back. Physical assessment reveals heart rate is normal, breath sounds clear to auscultation, no wheezes, rales, or rhonchi. Grip strength equal, strong. Distal pulses 2+ bilateral posterior tibial present without peripheral edema. Patient reports she does take medications as prescribed. Patient states she follows a Regular diet. The  patient has been trying to gain weight by eating 3 meals per day.  She lost down to 97 pounds after her hospitalization in November 2016 and has gained up to 110 pounds as of today.. Patient's weight will be monitored closely. Demonstration and practice of PLB using pulse oximeter. Patient able to return demonstration satisfactorily. Safety and hand hygiene in the exercise area reviewed with patient. Patient voices understanding of the information reviewed. Department expectations discussed with patient and achievable goals were set. The patient shows enthusiasm about attending the program and we look forward to working with this nice lady. The patient is scheduled for a 6 min walk test on Tuesday July 06, 2015 @ 3:45 pm and to begin exercise on Tuesday, July 14, 2015 in the 1:30 pm class. [id number].

## 2015-07-07 ENCOUNTER — Encounter (HOSPITAL_COMMUNITY)
Admission: RE | Admit: 2015-07-07 | Discharge: 2015-07-07 | Disposition: A | Discharge status: Home / Self Care | Payer: Medicare Other | Source: Ambulatory Visit | Attending: Pulmonary Disease | Admitting: Pulmonary Disease

## 2015-07-07 DIAGNOSIS — J439 Emphysema, unspecified: Secondary | ICD-10-CM | POA: Diagnosis not present

## 2015-07-08 ENCOUNTER — Encounter (HOSPITAL_COMMUNITY): Payer: Self-pay | Admitting: *Deleted

## 2015-07-14 ENCOUNTER — Encounter (HOSPITAL_COMMUNITY)
Admission: RE | Admit: 2015-07-14 | Discharge: 2015-07-14 | Disposition: A | Discharge status: Home / Self Care | Payer: Medicare Other | Source: Ambulatory Visit | Attending: Pulmonary Disease | Admitting: Pulmonary Disease

## 2015-07-14 VITALS — Wt 110.5 lb

## 2015-07-14 DIAGNOSIS — J439 Emphysema, unspecified: Secondary | ICD-10-CM

## 2015-07-14 NOTE — Progress Notes (Signed)
Daily Session Note  Patient Details  Name: Gina Fritz MRN: [id number] Date of Birth: 1940-12-23 Referring Provider:  Chesley Mires, MD  Encounter Date: 07/14/2015  Check In:     Session Check In - 07/14/15 1555    Check-In   Location [facility]   Staff Present Rodney Langton, RN;Portia Rollene Rotunda, RN, BSN;Molly diVincenzo, MS, ACSM RCEP, Exercise Physiologist   Supervising physician immediately available to respond to emergencies [facility] immediately available   Physician(s) Dr. Marily Memos   Medication changes reported     No   Fall or balance concerns reported    No   Warm-up and Cool-down Performed as group-led instruction   Resistance Training Performed Yes   VAD Patient? No   Pain Assessment   Currently in Pain? No/denies   Multiple Pain Sites No      Capillary Blood Glucose: No results found for this or any previous visit (from the past 24 hour(s)).      Exercise Prescription Changes - 07/14/15 1500    Exercise Review   Progression No   Response to Exercise   Blood Pressure (Admit) 110/62 mmHg   Blood Pressure (Exercise) 124/78 mmHg   Blood Pressure (Exit) 96/60 mmHg   Heart Rate (Admit) 75 bpm   Heart Rate (Exercise) 79 bpm   Heart Rate (Exit) 67 bpm   Oxygen Saturation (Admit) 97 %   Oxygen Saturation (Exercise) 95 %   Oxygen Saturation (Exit) 99 %   Rating of Perceived Exertion (Exercise) 13   Perceived Dyspnea (Exercise) 1   Duration Progress to 45 minutes of aerobic exercise without signs/symptoms of physical distress   Intensity THRR unchanged   Progression   Progression Continue to progress workloads to maintain intensity without signs/symptoms of physical distress.   Resistance Training   Training Prescription Yes   Weight orange bands   Reps 10-12   Bike   Level 0.2   Minutes 15   NuStep   Level 1   Minutes 15   METs 1   Track   Laps 8   Minutes 15     Goals Met:  Exercise tolerated well Queuing for purse lip  breathing No report of cardiac concerns or symptoms Strength training completed today  Goals Unmet:  Not Applicable  Comments: Service time is from 1330 to 1510    Dr. Rush Farmer is Medical Director for Pulmonary Rehab at [facility].

## 2015-07-15 ENCOUNTER — Telehealth: Payer: Self-pay | Admitting: Pulmonary Disease

## 2015-07-15 NOTE — Telephone Encounter (Signed)
Left message for patient to call back  

## 2015-07-16 ENCOUNTER — Encounter (HOSPITAL_COMMUNITY)
Admission: RE | Admit: 2015-07-16 | Discharge: 2015-07-16 | Disposition: A | Discharge status: Home / Self Care | Payer: Medicare Other | Source: Ambulatory Visit | Attending: Pulmonary Disease | Admitting: Pulmonary Disease

## 2015-07-16 VITALS — Wt 110.2 lb

## 2015-07-16 DIAGNOSIS — J439 Emphysema, unspecified: Secondary | ICD-10-CM | POA: Diagnosis not present

## 2015-07-16 NOTE — Telephone Encounter (Signed)
Called and spoke to pt. Pt was called to reschedule appt with TP on 4/17 because clinic will not start until 11:15. Rescheduled pt to 4/20 with TP at 4pm. Pt verbalized understanding and denied any further questions or concerns at this time.

## 2015-07-16 NOTE — Progress Notes (Signed)
Daily Session Note  Patient Details  Name: Gina Fritz MRN: [id number] Date of Birth: 09-09-1940 Referring Provider:    Encounter Date: 07/16/2015  Check In:     Session Check In - 07/16/15 1356    Check-In   Location [facility]   Staff Present Su Hilt, MS, ACSM RCEP, Exercise Physiologist;Portia Rollene Rotunda, RN, Roque Cash, RN   Supervising physician immediately available to respond to emergencies [facility] immediately available   Physician(s) Dr. Aggie Moats   Medication changes reported     No   Fall or balance concerns reported    No   Warm-up and Cool-down Performed as group-led instruction   Resistance Training Performed Yes   VAD Patient? No   Pain Assessment   Currently in Pain? No/denies   Multiple Pain Sites No      Capillary Blood Glucose: No results found for this or any previous visit (from the past 24 hour(s)).      Exercise Prescription Changes - 07/16/15 1600    Exercise Review   Progression Yes   Response to Exercise   Blood Pressure (Admit) 120/70 mmHg   Blood Pressure (Exercise) 138/94 mmHg   Blood Pressure (Exit) 96/60 mmHg   Heart Rate (Admit) 69 bpm   Heart Rate (Exercise) 82 bpm   Heart Rate (Exit) 60 bpm   Oxygen Saturation (Admit) 99 %   Oxygen Saturation (Exercise) 97 %   Oxygen Saturation (Exit) 100 %   Rating of Perceived Exertion (Exercise) 13   Perceived Dyspnea (Exercise) 0   Duration Progress to 45 minutes of aerobic exercise without signs/symptoms of physical distress   Intensity THRR unchanged   Progression   Progression Continue to progress workloads to maintain intensity without signs/symptoms of physical distress.   Resistance Training   Training Prescription Yes   Weight ornage bands   Reps 10-12   Bike   Level 0.3   Minutes 15   NuStep   Level 1   Minutes 15   METs 1.6     Goals Met:  Exercise tolerated well No report of cardiac concerns or symptoms Strength training completed  today  Goals Unmet:  Not Applicable  Comments: Service time is from 1330 to 1530    Dr. Rush Farmer is Medical Director for Pulmonary Rehab at [facility].

## 2015-07-20 ENCOUNTER — Ambulatory Visit: Payer: Medicare Other | Admitting: Adult Health

## 2015-07-21 ENCOUNTER — Encounter (HOSPITAL_COMMUNITY)
Admission: RE | Admit: 2015-07-21 | Discharge: 2015-07-21 | Disposition: A | Discharge status: Home / Self Care | Payer: Medicare Other | Source: Ambulatory Visit | Attending: Pulmonary Disease | Admitting: Pulmonary Disease

## 2015-07-21 VITALS — Wt 110.5 lb

## 2015-07-21 DIAGNOSIS — J439 Emphysema, unspecified: Secondary | ICD-10-CM | POA: Diagnosis not present

## 2015-07-21 NOTE — Progress Notes (Signed)
Daily Session Note  Patient Details  Name: Gina Fritz MRN: [id number] Date of Birth: 03-31-1941 Referring Provider:    Encounter Date: 07/21/2015  Check In:     Session Check In - 07/21/15 1421    Check-In   Location [facility]   Staff Present Rosebud Poles, RN, BSN;Marsh Heckler, MS, ACSM RCEP, Exercise Physiologist;Lisa Ysidro Evert, RN   Supervising physician immediately available to respond to emergencies [facility] immediately available   Physician(s) Dr. Marily Memos   Medication changes reported     No   Fall or balance concerns reported    No   Warm-up and Cool-down Performed as group-led instruction   Resistance Training Performed Yes   VAD Patient? No   Pain Assessment   Currently in Pain? No/denies   Multiple Pain Sites No      Capillary Blood Glucose: No results found for this or any previous visit (from the past 24 hour(s)).      Exercise Prescription Changes - 07/21/15 1500    Exercise Review   Progression Yes   Response to Exercise   Blood Pressure (Admit) 148/80 mmHg   Blood Pressure (Exercise) 132/70 mmHg   Blood Pressure (Exit) 132/68 mmHg   Heart Rate (Admit) 60 bpm   Heart Rate (Exercise) 70 bpm   Heart Rate (Exit) 56 bpm   Oxygen Saturation (Admit) 99 %   Oxygen Saturation (Exercise) 98 %   Oxygen Saturation (Exit) 98 %   Rating of Perceived Exertion (Exercise) 13   Perceived Dyspnea (Exercise) 1   Duration Progress to 45 minutes of aerobic exercise without signs/symptoms of physical distress   Intensity THRR unchanged   Progression   Progression Continue to progress workloads to maintain intensity without signs/symptoms of physical distress.   Resistance Training   Training Prescription Yes   Weight ornage bands   Reps 10-12   Bike   Level 0.5   Minutes 15   NuStep   Level 2   Minutes 15   METs 1.6   Track   Laps 10   Minutes 15     Goals Met:  Exercise tolerated well Queuing for purse lip breathing No  report of cardiac concerns or symptoms Strength training completed today  Goals Unmet:  Not Applicable  Comments: Service time is from 1:30pm to 3:00pm    Dr. Rush Farmer is Medical Director for Pulmonary Rehab at [facility].

## 2015-07-21 NOTE — Progress Notes (Signed)
Pt completed Quality of Life survey as a participant in Pulmonary Rehab. Scores 21.0 or below are considered low. Pt score very low in several areas Overall 17.49, Health and Function 12.40, physiological and spiritual 23.14, family 30.0.  Gina Fritz is hopeful that attending pulmonary rehab will improve her quality of life by increasing her strength, stamina, and mental outlook.  She is already on an antidepressant and will continue on it.

## 2015-07-21 NOTE — Progress Notes (Signed)
Pulmonary Individual Treatment Plan  Patient Details  Name: Gina Fritz MRN: [id number] Date of Birth: 04/24/1940 Referring Provider:    Initial Encounter Date:       Pulmonary Rehab Walk Test from 07/07/2015 in [place]   Date  07/09/15      Visit Diagnosis: Pulmonary emphysema, unspecified emphysema type ([place])  Patient's Home Medications on Admission:   Current outpatient prescriptions:  .  acetaminophen (TYLENOL) 500 MG tablet, Take 1,000 mg by mouth every 6 (six) hours as needed for moderate pain or headache., Disp: , Rfl:  .  albuterol (PROAIR HFA) 108 (90 Base) MCG/ACT inhaler, Inhale 2 puffs into the lungs every 6 (six) hours as needed for wheezing or shortness of breath., Disp: 1 Inhaler, Rfl: 2 .  aspirin EC 81 MG tablet, Take 81 mg by mouth daily., Disp: , Rfl:  .  atorvastatin (LIPITOR) 40 MG tablet, Take 40 mg by mouth daily., Disp: , Rfl:  .  BESIVANCE 0.6 % SUSP, Place 1 drop into the right eye 4 (four) times daily. The day before and the day of the dr visit, Disp: , Rfl:  .  Cholecalciferol (VITAMIN D) 2000 UNITS CAPS, Take 2,000 Units by mouth daily., Disp: , Rfl:  .  DONNATAL 16.2 MG tablet, 16.2 tablets daily., Disp: , Rfl:  .  escitalopram (LEXAPRO) 20 MG tablet, , Disp: , Rfl:  .  fenofibrate micronized (ANTARA) 130 MG capsule, Take 130 mg by mouth daily. Reported on 07/23/2015, Disp: , Rfl: 1 .  fluticasone (FLONASE) 50 MCG/ACT nasal spray, Place 2 sprays into the nose daily., Disp: , Rfl:  .  ibuprofen (ADVIL,MOTRIN) 600 MG tablet, Take 1 tablet (600 mg total) by mouth every 8 (eight) hours as needed., Disp: 15 tablet, Rfl: 0 .  loperamide (IMODIUM) 2 MG capsule, Take 2-4 mg by mouth 4 (four) times daily as needed for diarrhea or loose stools. For upset stomach, Disp: , Rfl:  .  mometasone (ASMANEX) 220 MCG/INH inhaler, Inhale 2 puffs into the lungs daily. Reported on 07/23/2015, Disp: , Rfl:  .  mometasone-formoterol (DULERA) 100-5  MCG/ACT AERO, Inhale 2 puffs into the lungs 2 (two) times daily., Disp: 1 Inhaler, Rfl: 5 .  montelukast (SINGULAIR) 10 MG tablet, Take 10 mg by mouth daily. , Disp: , Rfl:  .  Multiple Vitamin (MULTIVITAMIN WITH MINERALS) TABS, Take 1 tablet by mouth daily., Disp: , Rfl:  .  Multiple Vitamins-Minerals (ICAPS AREDS 2 PO), Take 1 tablet by mouth 2 (two) times daily., Disp: , Rfl:  .  ranitidine (ZANTAC) 300 MG tablet, Take 300 mg by mouth 2 (two) times daily., Disp: , Rfl:  .  solifenacin (VESICARE) 10 MG tablet, Take 10 mg by mouth daily. , Disp: , Rfl:  .  tamsulosin (FLOMAX) 0.4 MG CAPS capsule, Take 1 capsule (0.4 mg total) by mouth daily after breakfast. (Patient not taking: Reported on 07/23/2015), Disp: 30 capsule, Rfl: 2  Past Medical History: Past Medical History  Diagnosis Date  . Hypertension   . Depression   . Allergic rhinitis   . Rash   . Irritable bowel syndrome   . Sensorineural hearing loss, unilateral   . Subjective tinnitus   . Loss of weight   . Dizziness   . COPD with emphysema ([place])   . Anxiety   . Right ureteral stone   . History of kidney stones   . Colitis     dx 02-05-2015  . CAP (community  acquired pneumonia)     dx 02-05-2015  . Pulmonary nodules     benign and stable per CT  . GERD (gastroesophageal reflux disease)   . Hyperlipidemia   . History of chronic sinusitis     Tobacco Use: History  Smoking status  . Former Smoker -- 1.00 packs/day for 25 years  . Types: Cigarettes  . Quit date: 02/05/2015  Smokeless tobacco  . Never Used    Labs:     Recent Review Flowsheet Data    There is no flowsheet data to display.      Capillary Blood Glucose: Lab Results  Component Value Date   GLUCAP 112* 02/09/2015     ADL [facility]:     Pulmonary Assessment Scores      07/08/15 1027       ADL [facility]   SOB Score total 72        Pulmonary Function Assessment:     Pulmonary Function Assessment - 07/06/15 1029    Breath   Bilateral Breath  Sounds Clear   Shortness of Breath Yes;Limiting activity      Exercise Target Goals:    Exercise Program Goal: Individual exercise prescription set with THRR, safety & activity barriers. Participant demonstrates ability to understand and report RPE using BORG scale, to self-measure pulse accurately, and to acknowledge the importance of the exercise prescription.  Exercise Prescription Goal: Starting with aerobic activity 30 plus minutes a day, 3 days per week for initial exercise prescription. Provide home exercise prescription and guidelines that participant acknowledges understanding prior to discharge.  Activity Barriers & Risk Stratification:     Activity Barriers & Cardiac Risk Stratification - 07/06/15 1026    Activity Barriers & Cardiac Risk Stratification   Activity Barriers Deconditioning;Muscular Weakness;Shortness of Breath      6 Minute Walk:     6 Minute Walk      07/09/15 0758       6 Minute Walk   Phase Initial     Distance 678 feet     Walk Time 6 minutes     # of Rest Breaks 0     MPH 1.28     METS 1.73     RPE 7     Perceived Dyspnea  0     VO2 Peak 6.08     Symptoms No     Resting HR 80 bpm     Resting BP 138/78 mmHg     Max Ex. HR 96 bpm     Max Ex. BP 120/80 mmHg     2 Minute Post BP 120/80 mmHg     Interval HR   Baseline HR 80     1 Minute HR 83     2 Minute HR 81     3 Minute HR 82     4 Minute HR 96     5 Minute HR 90     6 Minute HR 91     2 Minute Post HR 65     Interval Heart Rate? Yes     Interval Oxygen   Interval Oxygen? Yes     Baseline Oxygen Saturation % 98 %     Baseline Liters of Oxygen 0 L     1 Minute Oxygen Saturation % 97 %     1 Minute Liters of Oxygen 0 L     2 Minute Oxygen Saturation % 97 %     2 Minute Liters of Oxygen 0 L  3 Minute Oxygen Saturation % 96 %     3 Minute Liters of Oxygen 0 L     4 Minute Oxygen Saturation % 96 %     4 Minute Liters of Oxygen 0 L     5 Minute Oxygen Saturation % 94 %      5 Minute Liters of Oxygen 0 L     6 Minute Oxygen Saturation % 96 %     6 Minute Liters of Oxygen 0 L     2 Minute Post Oxygen Saturation % 98 %     2 Minute Post Liters of Oxygen 0 L        Initial Exercise Prescription:     Initial Exercise Prescription - 07/09/15 0800    Date of Initial Exercise RX and Referring Provider   Date 07/09/15   Oxygen   Oxygen --  room air   Bike   Level 0.2   Minutes 15   NuStep   Level 1   Minutes 15   METs 1.5   Track   Laps 5   Minutes 15   Prescription Details   Frequency (times per week) 2   Duration Progress to 45 minutes of aerobic exercise without signs/symptoms of physical distress   Intensity   THRR 40-80% of Max Heartrate 58-117   Ratings of Perceived Exertion 11-13   Perceived Dyspnea 0-4   Progression   Progression Continue to progress workloads to maintain intensity without signs/symptoms of physical distress.   Resistance Training   Training Prescription Yes   Weight orange bands   Reps 10-12      Perform Capillary Blood Glucose checks as needed.  Exercise Prescription Changes:      Exercise Prescription Changes      07/14/15 1500 07/16/15 1600 07/21/15 1500       Exercise Review   Progression No Yes Yes     Response to Exercise   Blood Pressure (Admit) 110/62 mmHg 120/70 mmHg 148/80 mmHg     Blood Pressure (Exercise) 124/78 mmHg 138/94 mmHg 132/70 mmHg     Blood Pressure (Exit) 96/60 mmHg 96/60 mmHg 132/68 mmHg     Heart Rate (Admit) 75 bpm 69 bpm 60 bpm     Heart Rate (Exercise) 79 bpm 82 bpm 70 bpm     Heart Rate (Exit) 67 bpm 60 bpm 56 bpm     Oxygen Saturation (Admit) 97 % 99 % 99 %     Oxygen Saturation (Exercise) 95 % 97 % 98 %     Oxygen Saturation (Exit) 99 % 100 % 98 %     Rating of Perceived Exertion (Exercise) 13 13 13      Perceived Dyspnea (Exercise) 1 0 1     Duration Progress to 45 minutes of aerobic exercise without signs/symptoms of physical distress Progress to 45 minutes of aerobic  exercise without signs/symptoms of physical distress Progress to 45 minutes of aerobic exercise without signs/symptoms of physical distress     Intensity THRR unchanged THRR unchanged THRR unchanged     Progression   Progression Continue to progress workloads to maintain intensity without signs/symptoms of physical distress. Continue to progress workloads to maintain intensity without signs/symptoms of physical distress. Continue to progress workloads to maintain intensity without signs/symptoms of physical distress.     Resistance Training   Training Prescription Yes Yes Yes     Weight orange bands ornage bands ornage bands     Reps 10-12 10-12 10-12  Bike   Level 0.2 0.3 0.5     Minutes 15 15 15      NuStep   Level 1 1 2      Minutes 15 15 15      METs 1 1.6 1.6     Track   Laps 8  10     Minutes 15  15        Exercise Comments:      Exercise Comments      07/23/15 0910           Exercise Comments Patient is starting off well with exercise. Will continue to monitor exericise progression.           Discharge Exercise Prescription (Final Exercise Prescription Changes):     Exercise Prescription Changes - 07/21/15 1500    Exercise Review   Progression Yes   Response to Exercise   Blood Pressure (Admit) 148/80 mmHg   Blood Pressure (Exercise) 132/70 mmHg   Blood Pressure (Exit) 132/68 mmHg   Heart Rate (Admit) 60 bpm   Heart Rate (Exercise) 70 bpm   Heart Rate (Exit) 56 bpm   Oxygen Saturation (Admit) 99 %   Oxygen Saturation (Exercise) 98 %   Oxygen Saturation (Exit) 98 %   Rating of Perceived Exertion (Exercise) 13   Perceived Dyspnea (Exercise) 1   Duration Progress to 45 minutes of aerobic exercise without signs/symptoms of physical distress   Intensity THRR unchanged   Progression   Progression Continue to progress workloads to maintain intensity without signs/symptoms of physical distress.   Resistance Training   Training Prescription Yes   Weight ornage  bands   Reps 10-12   Bike   Level 0.5   Minutes 15   NuStep   Level 2   Minutes 15   METs 1.6   Track   Laps 10   Minutes 15       Nutrition:  Target Goals: Understanding of nutrition guidelines, daily intake of sodium 1500mg , cholesterol 200mg , calories 30% from fat and 7% or less from saturated fats, daily to have 5 or more servings of fruits and vegetables.  Biometrics:     Pre Biometrics - 07/06/15 1032    Pre Biometrics   Grip Strength 20 kg       Nutrition Therapy Plan and Nutrition Goals:     Nutrition Therapy & Goals - 07/23/15 1526    Nutrition Therapy   Diet High Calorie, High Protein   Personal Nutrition Goals   Personal Goal #1 Wt gain of 0.5-2 lb/week to a goal wt of 115 lb at graduation from [place], educate and counsel regarding individualized specific dietary modifications aiming towards targeted core components such as weight, hypertension, lipid management, diabetes, heart failure and other comorbidities.;Nutrition handout(s) given to patient.  Handouts given for: High Calorie, High Protein diet ; Suggestions for Increasing Calories and Protein; and High Calorie, High Protein recipes   Expected Outcomes Short Term Goal: Understand basic principles of dietary content, such as calories, fat, sodium, cholesterol and nutrients.;Long Term Goal: Adherence to prescribed nutrition plan.      Nutrition Discharge: Rate Your Plate Scores:     Nutrition Assessments - 07/23/15 1517    Rate Your Plate Scores   Pre Score 41      Psychosocial: Target Goals: Acknowledge presence or absence of depression, maximize coping skills, provide positive support system. Participant is able to verbalize types and ability to use techniques and  skills needed for reducing stress and depression.  Initial Review & Psychosocial Screening:     Initial Psych Review & Screening - 07/06/15 1044    Initial Review   Current  issues with History of Depression   Family Dynamics   Good Support System? Yes   Barriers   Psychosocial barriers to participate in program There are no identifiable barriers or psychosocial needs.   Screening Interventions   Interventions Encouraged to exercise      Quality of Life Scores:     Quality of Life - 07/08/15 1020    Quality of Life Scores   Health/Function Pre 12.4 %   Socioeconomic Pre 16 %   Psych/Spiritual Pre 23.14 %   Family Pre 30 %   GLOBAL Pre 17.49 %      PHQ-9:     Recent Review Flowsheet Data    Depression screen [facility] 2/9 07/06/2015   Decreased Interest 0   Down, Depressed, Hopeless 1   PHQ - 2 Score 1      Psychosocial Evaluation and Intervention:     Psychosocial Evaluation - 07/06/15 1044    Psychosocial Evaluation & Interventions   Interventions Relaxation education;Encouraged to exercise with the program and follow exercise prescription   Comments Continue Lexapro, begin exercising in our program   Continued Psychosocial Services Needed No      Psychosocial Re-Evaluation:  Education: Education Goals: Education classes will be provided on a weekly basis, covering required topics. Participant will state understanding/return demonstration of topics presented.  Learning Barriers/Preferences:     Learning Barriers/Preferences - 07/06/15 1026    Learning Barriers/Preferences   Learning Barriers None   Learning Preferences Written Material;Verbal Instruction;Skilled Demonstration;Audio      Education Topics: Risk Factor Reduction:  -Group instruction that is supported by a PowerPoint presentation. Instructor discusses the definition of a risk factor, different risk factors for pulmonary disease, and how the heart and lungs work together.     Nutrition for Pulmonary Patient:  -Group instruction provided by PowerPoint slides, verbal discussion, and written materials to support subject matter. The instructor gives an explanation and  review of healthy diet recommendations, which includes a discussion on weight management, recommendations for fruit and vegetable consumption, as well as protein, fluid, caffeine, fiber, sodium, sugar, and alcohol. Tips for eating when patients are short of breath are discussed.   Pursed Lip Breathing:  -Group instruction that is supported by demonstration and informational handouts. Instructor discusses the benefits of pursed lip and diaphragmatic breathing and detailed demonstration on how to preform both.        PULMONARY REHAB OTHER RESPIRATORY from 07/16/2015 in [place]   Date  07/16/15   Educator  RT   Instruction Review Code  2- meets goals/outcomes      Oxygen Safety:  -Group instruction provided by PowerPoint, verbal discussion, and written material to support subject matter. There is an overview of "What is Oxygen" and "Why do we need it".  Instructor also reviews how to create a safe environment for oxygen use, the importance of using oxygen as prescribed, and the risks of noncompliance. There is a brief discussion on traveling with oxygen and resources the patient may utilize.   Oxygen Equipment:  -Group instruction provided by [facility] Staff utilizing handouts, written materials, and equipment demonstrations.   Signs and Symptoms:  -Group instruction provided by written material and verbal discussion to support subject matter. Warning signs and symptoms of infection, stroke, and heart  attack are reviewed and when to call the physician/911 reinforced. Tips for preventing the spread of infection discussed.   Advanced Directives:  -Group instruction provided by verbal instruction and written material to support subject matter. Instructor reviews Advanced Directive laws and proper instruction for filling out document.   Pulmonary Video:  -Group video education that reviews the importance of medication and oxygen compliance, exercise, good  nutrition, pulmonary hygiene, and pursed lip and diaphragmatic breathing for the pulmonary patient.   Exercise for the Pulmonary Patient:  -Group instruction that is supported by a PowerPoint presentation. Instructor discusses benefits of exercise, core components of exercise, frequency, duration, and intensity of an exercise routine, importance of utilizing pulse oximetry during exercise, safety while exercising, and options of places to exercise outside of rehab.     Pulmonary Medications:  -Verbally interactive group education provided by instructor with focus on inhaled medications and proper administration.          PULMONARY REHAB OTHER RESPIRATORY from 07/16/2015 in [place]   Date  07/16/15   Educator  RT   Instruction Review Code  2- meets goals/outcomes      Anatomy and Physiology of the Respiratory System and Intimacy:  -Group instruction provided by PowerPoint, verbal discussion, and written material to support subject matter. Instructor reviews respiratory cycle and anatomical components of the respiratory system and their functions. Instructor also reviews differences in obstructive and restrictive respiratory diseases with examples of each. Intimacy, Sex, and Sexuality differences are reviewed with a discussion on how relationships can change when diagnosed with pulmonary disease. Common sexual concerns are reviewed.   Knowledge Questionnaire Score:     Knowledge Questionnaire Score - 07/08/15 1019    Knowledge Questionnaire Score   Pre Score 9/13      Core Components/Risk Factors/Patient Goals at Admission:     Personal Goals and Risk Factors at Admission - 07/06/15 1032    Core Components/Risk Factors/Patient Goals on Admission   Increase Strength and Stamina Yes   Intervention Provide advice, education, support and counseling about physical activity/exercise needs.;Develop an individualized exercise prescription for aerobic and  resistive training based on initial evaluation findings, risk stratification, comorbidities and participant's personal goals.   Expected Outcomes Achievement of increased cardiorespiratory fitness and enhanced flexibility, muscular endurance and strength shown through measurements of functional capacity and personal statement of participant.   Improve shortness of breath with ADL's Yes   Intervention Provide education, individualized exercise plan and daily activity instruction to help decrease symptoms of SOB with activities of daily living.   Expected Outcomes Short Term: Achieves a reduction of symptoms when performing activities of daily living.   Intervention Provide education, demonstration and support about specific breathing techniuqes utilized for more efficient breathing. Include techniques such as pursed lipped breathing, diaphragmatic breathing and self-pacing activity.   Expected Outcomes Short Term: Participant will be able to demonstrate and use breathing techniques as needed throughout daily activities.   Personal Goal Other Yes   Personal Goal To be able to have the energy to do more house work and gardening.     Intervention Increase workloads on equipment as tolerated to increase her strength and stamina   Expected Outcomes To have the energy to perform ADL's at home.      Core Components/Risk Factors/Patient Goals Review:      Goals and Risk Factor Review      07/06/15 1043           Core Components/Risk  Factors/Patient Goals Review   Personal Goals Review Increase Strength and Stamina;Develop more efficient breathing techniques such as purse lipped breathing and diaphragmatic breathing and practicing self-pacing with activity.;Improve shortness of breath with ADL's          Core Components/Risk Factors/Patient Goals at Discharge (Final Review):      Goals and Risk Factor Review - 07/06/15 1043    Core Components/Risk Factors/Patient Goals Review   Personal Goals  Review Increase Strength and Stamina;Develop more efficient breathing techniques such as purse lipped breathing and diaphragmatic breathing and practicing self-pacing with activity.;Improve shortness of breath with ADL's      ITP Comments:   Comments: ITP REVIEW Pt is making expected progress toward personal goals after completing 3 sessions.   Recommend continued exercise, life style modification, education, and utilization of breathing texhniques to increase stamina and strength and decrease shortness of breath with exertion.

## 2015-07-23 ENCOUNTER — Ambulatory Visit (INDEPENDENT_AMBULATORY_CARE_PROVIDER_SITE_OTHER): Payer: Medicare Other | Admitting: Adult Health

## 2015-07-23 ENCOUNTER — Encounter (HOSPITAL_COMMUNITY)
Admission: RE | Admit: 2015-07-23 | Discharge: 2015-07-23 | Disposition: A | Discharge status: Home / Self Care | Payer: Medicare Other | Source: Ambulatory Visit | Attending: Pulmonary Disease | Admitting: Pulmonary Disease

## 2015-07-23 ENCOUNTER — Encounter: Payer: Self-pay | Admitting: Adult Health

## 2015-07-23 VITALS — Wt 112.4 lb

## 2015-07-23 VITALS — BP 126/80 | HR 62 | Temp 98.0°F | Ht 62.0 in | Wt 112.0 lb

## 2015-07-23 DIAGNOSIS — J439 Emphysema, unspecified: Secondary | ICD-10-CM | POA: Diagnosis not present

## 2015-07-23 DIAGNOSIS — J449 Chronic obstructive pulmonary disease, unspecified: Secondary | ICD-10-CM

## 2015-07-23 DIAGNOSIS — J45909 Unspecified asthma, uncomplicated: Secondary | ICD-10-CM

## 2015-07-23 NOTE — Progress Notes (Signed)
Subjective:    Patient ID: Gina Fritz, female    DOB: 1940/08/13, 75 y.o.   MRN: [id number]  HPI  75 year old female former smoker seen for pulmonary consult during hospitalization. November 2016  For community-acquired pneumonia, hemoptysis.  TEST  CT chest 02/05/15 >> moderate emphysema PFT 05/22/15 >> FEV1 1.54 (78%), FEV1% 70, TLC 6.22 (130%), DLCO 46%, no BD  07/23/2015 Follow up : COPD /Asthma  Pt returns for 1 month follow up . She is doing better. Now on [facility].  Has started Pulmonary rehab. Feels that it is going to help her regain her strength.  She denies chest pain, orthopnea, edema or fever.  Pneumovax is utd.   CXR 06/2015 with copd changes.  D Dimer last month neg and CBC showed nml wbc and hbg.       Past Medical History  Diagnosis Date  . Hypertension   . Depression   . Allergic rhinitis   . Rash   . Irritable bowel syndrome   . Sensorineural hearing loss, unilateral   . Subjective tinnitus   . Loss of weight   . Dizziness   . COPD with emphysema ([place])   . Anxiety   . Right ureteral stone   . History of kidney stones   . Colitis     dx 02-05-2015  . CAP (community acquired pneumonia)     dx 02-05-2015  . Pulmonary nodules     benign and stable per CT  . GERD (gastroesophageal reflux disease)   . Hyperlipidemia   . History of chronic sinusitis    Current Outpatient Prescriptions on File Prior to Visit  Medication Sig Dispense Refill  . acetaminophen (TYLENOL) 500 MG tablet Take 1,000 mg by mouth every 6 (six) hours as needed for moderate pain or headache.    . albuterol (PROAIR HFA) 108 (90 Base) MCG/ACT inhaler Inhale 2 puffs into the lungs every 6 (six) hours as needed for wheezing or shortness of breath. 1 Inhaler 2  . aspirin EC 81 MG tablet Take 81 mg by mouth daily.    Marland Kitchen atorvastatin (LIPITOR) 40 MG tablet Take 40 mg by mouth daily.    Marland Kitchen BESIVANCE 0.6 % SUSP Place 1 drop into the right eye 4 (four) times daily. The day before and the day  of the dr visit    . Cholecalciferol (VITAMIN D) 2000 UNITS CAPS Take 2,000 Units by mouth daily.    . DONNATAL 16.2 MG tablet 16.2 tablets daily.    Marland Kitchen escitalopram (LEXAPRO) 20 MG tablet     . fluticasone (FLONASE) 50 MCG/ACT nasal spray Place 2 sprays into the nose daily.    Marland Kitchen ibuprofen (ADVIL,MOTRIN) 600 MG tablet Take 1 tablet (600 mg total) by mouth every 8 (eight) hours as needed. 15 tablet 0  . loperamide (IMODIUM) 2 MG capsule Take 2-4 mg by mouth 4 (four) times daily as needed for diarrhea or loose stools. For upset stomach    . mometasone-formoterol (DULERA) 100-5 MCG/ACT AERO Inhale 2 puffs into the lungs 2 (two) times daily. 1 Inhaler 5  . montelukast (SINGULAIR) 10 MG tablet Take 10 mg by mouth daily.     . Multiple Vitamin (MULTIVITAMIN WITH MINERALS) TABS Take 1 tablet by mouth daily.    . Multiple Vitamins-Minerals (ICAPS AREDS 2 PO) Take 1 tablet by mouth 2 (two) times daily.    . ranitidine (ZANTAC) 300 MG tablet Take 300 mg by mouth 2 (two) times daily.    . solifenacin (  VESICARE) 10 MG tablet Take 10 mg by mouth daily.     . fenofibrate micronized (ANTARA) 130 MG capsule Take 130 mg by mouth daily. Reported on 07/23/2015  1  . mometasone (ASMANEX) 220 MCG/INH inhaler Inhale 2 puffs into the lungs daily. Reported on 07/23/2015    . tamsulosin (FLOMAX) 0.4 MG CAPS capsule Take 1 capsule (0.4 mg total) by mouth daily after breakfast. (Patient not taking: Reported on 07/23/2015) 30 capsule 2   No current facility-administered medications on file prior to visit.      Review of Systems  Constitutional:   No  weight loss, night sweats,  Fevers, chills, + fatigue, or  lassitude.  HEENT:   No headaches,  Difficulty swallowing,  Tooth/dental problems, or  Sore throat,                No sneezing, itching, ear ache, nasal congestion, post nasal drip,   CV:  No chest pain,  Orthopnea, PND, swelling in lower extremities, anasarca, dizziness, palpitations, syncope.   GI  No  heartburn, indigestion, abdominal pain, nausea, vomiting, diarrhea, change in bowel habits, loss of appetite, bloody stools.   Resp:   No chest wall deformity  Skin: no rash or lesions.  GU: no dysuria, change in color of urine, no urgency or frequency.  No flank pain, no hematuria   MS:  No joint pain or swelling.  No decreased range of motion.  No back pain.  Psych:  No change in mood or affect. No depression or anxiety.  No memory loss.          Objective:   Physical Exam   Filed Vitals:   07/23/15 1617  BP: 126/80  Pulse: 62  Temp: 98 F (36.7 C)  TempSrc: Oral  Height: 5\' 2"  (1.575 m)  Weight: 112 lb (50.803 kg)  SpO2: 98%    GEN: A/Ox3; pleasant , NAD, thin and frail   HEENT:  [place]/AT,  EACs-clear, TMs-wnl, NOSE-clear, THROAT-clear, no lesions, no postnasal drip or exudate noted.   NECK:  Supple w/ fair ROM; no JVD; normal carotid impulses w/o bruits; no thyromegaly or nodules palpated; no lymphadenopathy.  RESP  Decreased BS in bases .no accessory muscle use, no dullness to percussion  CARD:  RRR, no m/r/g  , no peripheral edema, pulses intact, no cyanosis or clubbing.  GI:   Soft & nt; nml bowel sounds; no organomegaly or masses detected.  Musco: Warm bil, no deformities or joint swelling noted.   Neuro: alert, no focal deficits noted.    Skin: Warm, no lesions or rashes   06/16/15  COPD changes   Tammy Parrett NP-C  [facility]     07/23/2015 Assessment & Plan:

## 2015-07-23 NOTE — Patient Instructions (Addendum)
Continue on Dulera 2 puffs Twice daily  , rinse after use.  Continue with Pulmonary rehab. -great job.  Follow up with Dr. Halford Chessman in 4 months and As needed

## 2015-07-23 NOTE — Progress Notes (Signed)
Gina Fritz 75 y.o. female Nutrition Note Spoke with pt. Pt is at a normal wt at this time. Pt with a recent h/o wt loss down to 97 lb. Pt states wt loss was due to pna. Pt denies dx of colitis/Crohn's. Per discussion, pt wants to gain up to 115 lb "so I have a cushion if I get sick."  Pt's Rate Your Plate results not reviewed with pt due to High Calorie, High Protein diet. Pt educated re: high calorie, high protein diet. Pt reports her appetite was poor, but has improved. Pt states she took an appetite stimulant, but has stopped taking it due to "I'm eating pretty well." Pt expressed understanding of the information reviewed via feedback method.   No results found for: HGBA1C  Nutrition Diagnosis ? Food-and nutrition-related knowledge deficit related to lack of exposure to information as related to diagnosis of pulmonary disease ? Increased energy expenditure related to increased energy requirements during pna illness as evidenced by recent h/o wt loss. ?  Nutrition Rx/Est. Daily Nutrition Needs for: ? wt gain 1800-2050 Kcal  75-90 gm protein   1500 mg or less sodium   Nutrition Intervention ? Pt's individual nutrition plan and goals reviewed with pt. ? Benefits of adopting healthy eating habits discussed when pt's Rate Your Plate reviewed. ? Handouts given for: High Calorie, High Protein diet, Suggestions for increasing calories and protein, and High calorie, high protein recipes ? Pt to attend the Nutrition and Lung Disease class ? Continual client-centered nutrition education by RD, as part of interdisciplinary care. Goal(s) 1. The pt will recognize symptoms that can interfere with adequate oral intake, such as shortness of breath, N/V, early satiety, fatigue, ability to secure and prepare food, taste and smell changes, chewing/swallowing difficulties, and/ or pain when eating. 2. The pt will consume high-energy, high-nutrient dense beverages when necessary to compensate for decreased  oral intake of solid foods. 3. Identify food quantities necessary to achieve wt gain of  -2# per week to a goal wt of 115 lb at graduation from pulmonary rehab. Monitor and Evaluate progress toward nutrition goal with team.   Derek Mound, M.Ed, RD, LDN, CDE 07/23/2015 3:19 PM

## 2015-07-23 NOTE — Assessment & Plan Note (Signed)
Improved control on Dulera  Doing well in pulmonary rehab.   Plan  ontinue on Dulera 2 puffs Twice daily  , rinse after use.  Continue with Pulmonary rehab. -great job.  Follow up with Dr. Halford Chessman in 4 months and As needed

## 2015-07-23 NOTE — Progress Notes (Signed)
Daily Session Note  Patient Details  Name: Gina Fritz MRN: [id number] Date of Birth: 07-Jan-1941 Referring Provider:    Encounter Date: 07/23/2015  Check In:     Session Check In - 07/23/15 1357    Check-In   Location [facility]   Staff Present Rosebud Poles, RN, Luisa Hart, RN, BSN;Molly diVincenzo, MS, ACSM RCEP, Exercise Physiologist;Zedekiah Hinderman Ysidro Evert, RN   Supervising physician immediately available to respond to emergencies [facility] immediately available   Physician(s) Dr. Marily Memos   Medication changes reported     No   Fall or balance concerns reported    No   Warm-up and Cool-down Performed as group-led instruction   Resistance Training Performed Yes   VAD Patient? No   Pain Assessment   Currently in Pain? No/denies   Multiple Pain Sites No      Capillary Blood Glucose: No results found for this or any previous visit (from the past 24 hour(s)).      Exercise Prescription Changes - 07/23/15 1600    Response to Exercise   Blood Pressure (Admit) 100/70 mmHg   Blood Pressure (Exercise) 100/62 mmHg   Blood Pressure (Exit) 134/74 mmHg   Heart Rate (Admit) 64 bpm   Heart Rate (Exercise) 66 bpm   Heart Rate (Exit) 62 bpm   Oxygen Saturation (Admit) 98 %   Oxygen Saturation (Exercise) 98 %   Oxygen Saturation (Exit) 97 %   Rating of Perceived Exertion (Exercise) 13   Perceived Dyspnea (Exercise) 0   Duration Progress to 45 minutes of aerobic exercise without signs/symptoms of physical distress   Intensity THRR unchanged   Progression   Progression Continue to progress workloads to maintain intensity without signs/symptoms of physical distress.   Resistance Training   Training Prescription Yes   Weight orange bands   Reps 10-12   NuStep   Level 2   Minutes 15   METs 1.4     Goals Met:  Exercise tolerated well No report of cardiac concerns or symptoms Strength training completed today  Goals Unmet:  Not Applicable  Comments:  Service time is from 1530 to [place]    Dr. Rush Farmer is Medical Director for Pulmonary Rehab at [facility].

## 2015-07-24 ENCOUNTER — Encounter (INDEPENDENT_AMBULATORY_CARE_PROVIDER_SITE_OTHER): Payer: Medicare Other | Admitting: Ophthalmology

## 2015-07-24 DIAGNOSIS — H353211 Exudative age-related macular degeneration, right eye, with active choroidal neovascularization: Secondary | ICD-10-CM

## 2015-07-24 DIAGNOSIS — H35033 Hypertensive retinopathy, bilateral: Secondary | ICD-10-CM

## 2015-07-24 DIAGNOSIS — H353122 Nonexudative age-related macular degeneration, left eye, intermediate dry stage: Secondary | ICD-10-CM | POA: Diagnosis not present

## 2015-07-24 DIAGNOSIS — I10 Essential (primary) hypertension: Secondary | ICD-10-CM | POA: Diagnosis not present

## 2015-07-24 DIAGNOSIS — H43813 Vitreous degeneration, bilateral: Secondary | ICD-10-CM

## 2015-07-24 NOTE — Progress Notes (Signed)
Reviewed, and agree with assessment/plan.  Chesley Mires, MD [facility] Pulmonary/Critical Care 07/24/2015, 10:19 AM Pager:  [phone]

## 2015-07-28 ENCOUNTER — Encounter (HOSPITAL_COMMUNITY)
Admission: RE | Admit: 2015-07-28 | Discharge: 2015-07-28 | Disposition: A | Discharge status: Home / Self Care | Payer: Medicare Other | Source: Ambulatory Visit | Attending: Pulmonary Disease | Admitting: Pulmonary Disease

## 2015-07-28 VITALS — Wt 113.3 lb

## 2015-07-28 DIAGNOSIS — J439 Emphysema, unspecified: Secondary | ICD-10-CM

## 2015-07-28 NOTE — Progress Notes (Signed)
Daily Session Note  Patient Details  Name: Gina Fritz MRN: [id number] Date of Birth: Oct 14, 1940 Referring Provider:    Encounter Date: 07/28/2015  Check In:     Session Check In - 07/28/15 1455    Check-In   Staff Present Rosebud Poles, RN, BSN;Molly diVincenzo, MS, ACSM RCEP, Exercise Physiologist;Portia Rollene Rotunda, RN, BSN   Supervising physician immediately available to respond to emergencies [facility] immediately available   Physician(s) Dr. Marily Memos   Medication changes reported     No   Fall or balance concerns reported    No   Warm-up and Cool-down Performed as group-led instruction   Resistance Training Performed Yes   VAD Patient? No   Pain Assessment   Currently in Pain? No/denies   Multiple Pain Sites No      Capillary Blood Glucose: No results found for this or any previous visit (from the past 24 hour(s)).      Exercise Prescription Changes - 07/28/15 1400    Exercise Review   Progression Yes   Response to Exercise   Blood Pressure (Admit) 108/60 mmHg   Blood Pressure (Exercise) 98/60 mmHg   Blood Pressure (Exit) 110/60 mmHg   Heart Rate (Admit) 62 bpm   Heart Rate (Exercise) 107 bpm   Heart Rate (Exit) 67 bpm   Oxygen Saturation (Admit) 95 %   Oxygen Saturation (Exercise) 91 %   Oxygen Saturation (Exit) 97 %   Rating of Perceived Exertion (Exercise) 13   Perceived Dyspnea (Exercise) 1   Duration Progress to 45 minutes of aerobic exercise without signs/symptoms of physical distress   Intensity THRR unchanged   Progression   Progression Continue to progress workloads to maintain intensity without signs/symptoms of physical distress.   Resistance Training   Training Prescription Yes   Weight orange bands   Reps 10-12   Bike   Level 0.5   Minutes 15   NuStep   Level 3   Minutes 15   METs 1.2   Track   Laps 9   Minutes 15     Goals Met:  Exercise tolerated well No report of cardiac concerns or symptoms Strength training completed  today  Goals Unmet:  Not Applicable  Comments: Service time is from 1330 to 1455    Dr. Rush Farmer is Medical Director for Pulmonary Rehab at [facility].

## 2015-07-28 NOTE — Progress Notes (Signed)
I have reviewed a Home Exercise Prescription with Gina Fritz . Gina Fritz is not currently exercising at home.  The patient was advised to walk 2-3 days a week for 30 minutes.  Gina Fritz and I discussed how to progress their exercise prescription.  The patient stated that their goals were to be able to increase strength, weigh above 110 pounds, and increase energy.  The patient stated that they understand the exercise prescription.  We reviewed exercise guidelines, target heart rate during exercise, oxygen use, weather, home pulse oximeter, endpoints for exercise, and goals.  Patient is encouraged to come to me with any questions. I will continue to follow up with the patient to assist them with progression and safety.

## 2015-07-30 ENCOUNTER — Encounter (HOSPITAL_COMMUNITY)
Admission: RE | Admit: 2015-07-30 | Discharge: 2015-07-30 | Disposition: A | Discharge status: Home / Self Care | Payer: Medicare Other | Source: Ambulatory Visit | Attending: Pulmonary Disease | Admitting: Pulmonary Disease

## 2015-07-30 VITALS — Wt 113.5 lb

## 2015-07-30 DIAGNOSIS — J439 Emphysema, unspecified: Secondary | ICD-10-CM | POA: Diagnosis not present

## 2015-07-30 NOTE — Progress Notes (Signed)
Daily Session Note  Patient Details  Name: Gina Fritz MRN: [id number] Date of Birth: 01/17/1941 Referring Provider:    Encounter Date: 07/30/2015  Check In:     Session Check In - 07/30/15 1330    Check-In   Staff Present Joan Behrens, RN, BSN;Molly diVincenzo, MS, ACSM RCEP, Exercise Physiologist;Lisa Hughes, RN   Supervising physician immediately available to respond to emergencies [facility] immediately available   Physician(s) Dr. Vann   Medication changes reported     No   Fall or balance concerns reported    No   Warm-up and Cool-down Performed as group-led instruction   Resistance Training Performed Yes   VAD Patient? No   Pain Assessment   Currently in Pain? No/denies   Multiple Pain Sites No      Capillary Blood Glucose: No results found for this or any previous visit (from the past 24 hour(s)).      Exercise Prescription Changes - 07/30/15 1500    Exercise Review   Progression Yes   Response to Exercise   Blood Pressure (Admit) 112/70 mmHg   Blood Pressure (Exercise) 100/60 mmHg   Blood Pressure (Exit) 106/60 mmHg   Heart Rate (Admit) 61 bpm   Heart Rate (Exercise) 95 bpm   Heart Rate (Exit) 68 bpm   Oxygen Saturation (Admit) 96 %   Oxygen Saturation (Exercise) 94 %   Oxygen Saturation (Exit) 98 %   Rating of Perceived Exertion (Exercise) 11   Perceived Dyspnea (Exercise) 1   Duration Progress to 45 minutes of aerobic exercise without signs/symptoms of physical distress   Intensity THRR unchanged   Progression   Progression Continue to progress workloads to maintain intensity without signs/symptoms of physical distress.   Resistance Training   Training Prescription Yes   Weight orange bands   Reps 10-12   Interval Training   Interval Training No   Bike   Level 0.7   Minutes 15   NuStep   Level 3   Minutes 15   METs 1.5   Track   Laps 13   Minutes 15     Goals Met:  Improved SOB with ADL's Exercise tolerated well Strength  training completed today  Goals Unmet:  Not Applicable  Comments: Service time is from 1330 to 1445    Dr. Wesam G. Yacoub is Medical Director for Pulmonary Rehab at [facility]. 

## 2015-08-04 ENCOUNTER — Encounter (HOSPITAL_COMMUNITY)
Admission: RE | Admit: 2015-08-04 | Discharge: 2015-08-04 | Disposition: A | Discharge status: Home / Self Care | Payer: Medicare Other | Source: Ambulatory Visit | Attending: Pulmonary Disease | Admitting: Pulmonary Disease

## 2015-08-04 VITALS — Wt 114.9 lb

## 2015-08-04 DIAGNOSIS — J439 Emphysema, unspecified: Secondary | ICD-10-CM | POA: Diagnosis not present

## 2015-08-04 NOTE — Progress Notes (Signed)
Daily Session Note  Patient Details  Name: Gina Fritz MRN: [id number] Date of Birth: Sep 17, 1940 Referring Provider:    Encounter Date: 08/04/2015  Check In:     Session Check In - 08/04/15 1513    Check-In   [facility]   Staff Present Rosebud Poles, RN, BSN;Molly diVincenzo, MS, ACSM RCEP, Exercise Physiologist   Supervising physician immediately available to respond to emergencies [facility] immediately available   Physician(s) Dr. Marily Memos   Medication changes reported     No   Fall or balance concerns reported    No   Warm-up and Cool-down Performed as group-led instruction   Resistance Training Performed Yes   VAD Patient? No   Pain Assessment   Currently in Pain? No/denies   Multiple Pain Sites No      Capillary Blood Glucose: No results found for this or any previous visit (from the past 24 hour(s)).      Exercise Prescription Changes - 08/04/15 1500    Response to Exercise   Blood Pressure (Admit) 122/70 mmHg   Blood Pressure (Exercise) 120/70 mmHg   Blood Pressure (Exit) 116/64 mmHg   Heart Rate (Admit) 62 bpm   Heart Rate (Exercise) 74 bpm   Heart Rate (Exit) 70 bpm   Oxygen Saturation (Admit) 99 %   Oxygen Saturation (Exercise) 95 %   Oxygen Saturation (Exit) 94 %   Rating of Perceived Exertion (Exercise) 15   Perceived Dyspnea (Exercise) 2   Duration Progress to 45 minutes of aerobic exercise without signs/symptoms of physical distress   Intensity THRR unchanged   Progression   Progression Continue to progress workloads to maintain intensity without signs/symptoms of physical distress.   Resistance Training   Training Prescription Yes   Weight orange bands'   Reps 10-12   Interval Training   Interval Training No   Bike   Level 0.7   Minutes 15   NuStep   Level 3   Minutes 15   METs 1.8   Track   Laps 14   Minutes 15     Goals Met:  Exercise tolerated well No report of cardiac concerns or  symptoms Strength training completed today  Goals Unmet:  Not Applicable  Comments: Service time is from 1330 to 1500    Dr. Rush Farmer is Medical Director for Pulmonary Rehab at [facility].

## 2015-08-06 ENCOUNTER — Encounter (HOSPITAL_COMMUNITY)
Admission: RE | Admit: 2015-08-06 | Discharge: 2015-08-06 | Disposition: A | Discharge status: Home / Self Care | Payer: Medicare Other | Source: Ambulatory Visit | Attending: Pulmonary Disease | Admitting: Pulmonary Disease

## 2015-08-06 VITALS — Wt 115.5 lb

## 2015-08-06 DIAGNOSIS — J439 Emphysema, unspecified: Secondary | ICD-10-CM

## 2015-08-06 NOTE — Progress Notes (Signed)
Daily Session Note  Patient Details  Name: Gina Fritz MRN: [id number] Date of Birth: 1941-03-14 Referring Provider:    Encounter Date: 08/06/2015  Check In:     Session Check In - 08/06/15 1355    Check-In   [facility]   Staff Present Rosebud Poles, RN, BSN;Rhiana Morash Ysidro Evert, RN;Portia Rollene Rotunda, RN, BSN;Molly diVincenzo, MS, ACSM RCEP, Exercise Physiologist   Supervising physician immediately available to respond to emergencies [facility] immediately available   Physician(s) Dr. Waldron Labs   Medication changes reported     No   Fall or balance concerns reported    No   Warm-up and Cool-down Performed as group-led instruction   Resistance Training Performed Yes   VAD Patient? No   Pain Assessment   Currently in Pain? No/denies   Multiple Pain Sites No      Capillary Blood Glucose: No results found for this or any previous visit (from the past 24 hour(s)).      Exercise Prescription Changes - 08/06/15 1500    Exercise Review   Progression Yes   Response to Exercise   Blood Pressure (Admit) 140/80 mmHg   Blood Pressure (Exercise) 118/74 mmHg   Blood Pressure (Exit) 116/60 mmHg   Heart Rate (Admit) 62 bpm   Heart Rate (Exercise) 77 bpm   Heart Rate (Exit) 64 bpm   Oxygen Saturation (Admit) 96 %   Oxygen Saturation (Exercise) 94 %   Oxygen Saturation (Exit) 98 %   Rating of Perceived Exertion (Exercise) 13   Perceived Dyspnea (Exercise) 1   Duration Progress to 45 minutes of aerobic exercise without signs/symptoms of physical distress   Intensity THRR unchanged   Progression   Progression Continue progressive overload as per policy without signs/symptoms or physical distress.   Resistance Training   Training Prescription Yes   Weight orange bands   Reps 10-12   Interval Training   Interval Training No   NuStep   Level 4  chest pain relieved with rest didn't return   Minutes 15   METs 1.2   Track   Laps 8   Minutes 15     Goals  Met:  Using PLB without cueing & demonstrates good technique Strength training completed today  Goals Unmet:  Not Applicable  Comments: **Service time is from 1330 to 1530    Dr. Rush Farmer is Medical Director for Pulmonary Rehab at [facility].

## 2015-08-11 ENCOUNTER — Encounter (HOSPITAL_COMMUNITY): Payer: Medicare Other

## 2015-08-13 ENCOUNTER — Encounter (HOSPITAL_COMMUNITY)
Admission: RE | Admit: 2015-08-13 | Discharge: 2015-08-13 | Disposition: A | Discharge status: Home / Self Care | Payer: Medicare Other | Source: Ambulatory Visit | Attending: Pulmonary Disease | Admitting: Pulmonary Disease

## 2015-08-13 VITALS — Wt 114.6 lb

## 2015-08-13 DIAGNOSIS — J439 Emphysema, unspecified: Secondary | ICD-10-CM

## 2015-08-13 NOTE — Progress Notes (Signed)
Daily Session Note  Patient Details  Name: Gina Fritz MRN: [id number] Date of Birth: March 04, 1941 Referring Provider:    Encounter Date: 08/13/2015  Check In:     Session Check In - 08/13/15 1332    Check-In   [facility]   Staff Present Su Hilt, MS, ACSM RCEP, Exercise Physiologist;Yobana Culliton Leonia Reeves, RN, Luisa Hart, RN, BSN   Supervising physician immediately available to respond to emergencies [facility] immediately available   Physician(s) Dr. Marily Memos   Medication changes reported     No   Fall or balance concerns reported    No   Warm-up and Cool-down Performed as group-led instruction   Resistance Training Performed No   VAD Patient? Yes   Pain Assessment   Currently in Pain? No/denies   Multiple Pain Sites No      Capillary Blood Glucose: No results found for this or any previous visit (from the past 24 hour(s)).      Exercise Prescription Changes - 08/13/15 1600    Response to Exercise   Blood Pressure (Admit) 140/80 mmHg   Blood Pressure (Exercise) 138/82 mmHg   Blood Pressure (Exit) 118/70 mmHg   Heart Rate (Admit) 59 bpm   Heart Rate (Exercise) 71 bpm   Heart Rate (Exit) 57 bpm   Oxygen Saturation (Admit) 98 %   Oxygen Saturation (Exercise) 96 %   Oxygen Saturation (Exit) 98 %   Rating of Perceived Exertion (Exercise) 13   Perceived Dyspnea (Exercise) 1   Duration Progress to 45 minutes of aerobic exercise without signs/symptoms of physical distress   Intensity THRR unchanged   Progression   Progression Continue progressive overload as per policy without signs/symptoms or physical distress.   Resistance Training   Training Prescription Yes   Weight orange bands   Reps 10-12   Interval Training   Interval Training No   Bike   Level 0.7   NuStep   Level 4   Minutes 15   METs 2     Goals Met:  Exercise tolerated well Strength training completed today  Goals Unmet:  Not Applicable  Comments:  Service time is from 1330 to 1545    Dr. Rush Farmer is Medical Director for Pulmonary Rehab at [facility].

## 2015-08-18 ENCOUNTER — Encounter (HOSPITAL_COMMUNITY)
Admission: RE | Admit: 2015-08-18 | Discharge: 2015-08-18 | Disposition: A | Discharge status: Home / Self Care | Payer: Medicare Other | Source: Ambulatory Visit | Attending: Pulmonary Disease | Admitting: Pulmonary Disease

## 2015-08-18 VITALS — Wt 114.4 lb

## 2015-08-18 DIAGNOSIS — J439 Emphysema, unspecified: Secondary | ICD-10-CM

## 2015-08-18 NOTE — Progress Notes (Signed)
Daily Session Note  Patient Details  Name: Gina Fritz MRN: [id number] Date of Birth: 07/21/1940 Referring Provider:    Encounter Date: 08/18/2015  Check In:     Session Check In - 08/18/15 1332    Check-In   VAD Patient? No      Capillary Blood Glucose: No results found for this or any previous visit (from the past 24 hour(s)).      Exercise Prescription Changes - 08/18/15 1540    Response to Exercise   Blood Pressure (Admit) 120/70 mmHg   Blood Pressure (Exercise) 120/64 mmHg   Blood Pressure (Exit) 100/60 mmHg   Heart Rate (Admit) 63 bpm   Heart Rate (Exercise) 84 bpm   Heart Rate (Exit) 67 bpm   Oxygen Saturation (Admit) 97 %   Oxygen Saturation (Exercise) 96 %   Oxygen Saturation (Exit) 98 %   Rating of Perceived Exertion (Exercise) 17   Perceived Dyspnea (Exercise) 2   Duration Progress to 45 minutes of aerobic exercise without signs/symptoms of physical distress   Intensity THRR unchanged   Progression   Progression Continue progressive overload as per policy without signs/symptoms or physical distress.   Resistance Training   Training Prescription Yes   Weight orange bands   Reps 10-12   Interval Training   Interval Training No   Bike   Level 0.7   Minutes 15   NuStep   Level 4   Minutes 15   METs 1.9   Track   Laps 13   Minutes 15     Goals Met:  Improved SOB with ADL's Using PLB without cueing & demonstrates good technique Exercise tolerated well No report of cardiac concerns or symptoms Strength training completed today  Goals Unmet:  Not Applicable  Comments: Service time is from 1330 to 1500   Dr. Wesam G. Yacoub is Medical Director for Pulmonary Rehab at [facility]. 

## 2015-08-20 ENCOUNTER — Encounter (HOSPITAL_COMMUNITY)
Admission: RE | Admit: 2015-08-20 | Discharge: 2015-08-20 | Disposition: A | Discharge status: Home / Self Care | Payer: Medicare Other | Source: Ambulatory Visit | Attending: Pulmonary Disease | Admitting: Pulmonary Disease

## 2015-08-20 DIAGNOSIS — J439 Emphysema, unspecified: Secondary | ICD-10-CM | POA: Diagnosis not present

## 2015-08-20 NOTE — Progress Notes (Signed)
Pulmonary Individual Treatment Plan  Patient Details  Name: Gina Fritz MRN: [id number] Date of Birth: 14-May-1940 Referring Provider:    Initial Encounter Date:       Pulmonary Rehab Walk Test from 07/07/2015 in [place]   Date  07/09/15      Visit Diagnosis: No diagnosis found.  Patient's Home Medications on Admission:   Current outpatient prescriptions:  .  acetaminophen (TYLENOL) 500 MG tablet, Take 1,000 mg by mouth every 6 (six) hours as needed for moderate pain or headache., Disp: , Rfl:  .  albuterol (PROAIR HFA) 108 (90 Base) MCG/ACT inhaler, Inhale 2 puffs into the lungs every 6 (six) hours as needed for wheezing or shortness of breath., Disp: 1 Inhaler, Rfl: 2 .  aspirin EC 81 MG tablet, Take 81 mg by mouth daily., Disp: , Rfl:  .  atorvastatin (LIPITOR) 40 MG tablet, Take 40 mg by mouth daily., Disp: , Rfl:  .  BESIVANCE 0.6 % SUSP, Place 1 drop into the right eye 4 (four) times daily. The day before and the day of the dr visit, Disp: , Rfl:  .  Cholecalciferol (VITAMIN D) 2000 UNITS CAPS, Take 2,000 Units by mouth daily., Disp: , Rfl:  .  DONNATAL 16.2 MG tablet, 16.2 tablets daily., Disp: , Rfl:  .  escitalopram (LEXAPRO) 20 MG tablet, , Disp: , Rfl:  .  fenofibrate micronized (ANTARA) 130 MG capsule, Take 130 mg by mouth daily. Reported on 07/23/2015, Disp: , Rfl: 1 .  fluticasone (FLONASE) 50 MCG/ACT nasal spray, Place 2 sprays into the nose daily., Disp: , Rfl:  .  ibuprofen (ADVIL,MOTRIN) 600 MG tablet, Take 1 tablet (600 mg total) by mouth every 8 (eight) hours as needed., Disp: 15 tablet, Rfl: 0 .  loperamide (IMODIUM) 2 MG capsule, Take 2-4 mg by mouth 4 (four) times daily as needed for diarrhea or loose stools. For upset stomach, Disp: , Rfl:  .  mometasone (ASMANEX) 220 MCG/INH inhaler, Inhale 2 puffs into the lungs daily. Reported on 07/23/2015, Disp: , Rfl:  .  mometasone-formoterol (DULERA) 100-5 MCG/ACT AERO, Inhale 2 puffs into  the lungs 2 (two) times daily., Disp: 1 Inhaler, Rfl: 5 .  montelukast (SINGULAIR) 10 MG tablet, Take 10 mg by mouth daily. , Disp: , Rfl:  .  Multiple Vitamin (MULTIVITAMIN WITH MINERALS) TABS, Take 1 tablet by mouth daily., Disp: , Rfl:  .  Multiple Vitamins-Minerals (ICAPS AREDS 2 PO), Take 1 tablet by mouth 2 (two) times daily., Disp: , Rfl:  .  ranitidine (ZANTAC) 300 MG tablet, Take 300 mg by mouth 2 (two) times daily., Disp: , Rfl:  .  solifenacin (VESICARE) 10 MG tablet, Take 10 mg by mouth daily. , Disp: , Rfl:  .  tamsulosin (FLOMAX) 0.4 MG CAPS capsule, Take 1 capsule (0.4 mg total) by mouth daily after breakfast. (Patient not taking: Reported on 07/23/2015), Disp: 30 capsule, Rfl: 2  Past Medical History: Past Medical History  Diagnosis Date  . Hypertension   . Depression   . Allergic rhinitis   . Rash   . Irritable bowel syndrome   . Sensorineural hearing loss, unilateral   . Subjective tinnitus   . Loss of weight   . Dizziness   . COPD with emphysema ([place])   . Anxiety   . Right ureteral stone   . History of kidney stones   . Colitis     dx 02-05-2015  . CAP (community acquired pneumonia)  dx 02-05-2015  . Pulmonary nodules     benign and stable per CT  . GERD (gastroesophageal reflux disease)   . Hyperlipidemia   . History of chronic sinusitis     Tobacco Use: History  Smoking status  . Former Smoker -- 1.00 packs/day for 25 years  . Types: Cigarettes  . Quit date: 02/05/2015  Smokeless tobacco  . Never Used    Labs: Recent Review Flowsheet Data    There is no flowsheet data to display.      Capillary Blood Glucose: Lab Results  Component Value Date   GLUCAP 112* 02/09/2015     ADL UCSD:     Pulmonary Assessment Scores      07/08/15 1027       ADL [facility]   SOB Score total 72        Pulmonary Function Assessment:     Pulmonary Function Assessment - 07/06/15 1029    Breath   Bilateral Breath Sounds Clear   Shortness of Breath  Yes;Limiting activity      Exercise Target Goals:    Exercise Program Goal: Individual exercise prescription set with THRR, safety & activity barriers. Participant demonstrates ability to understand and report RPE using BORG scale, to self-measure pulse accurately, and to acknowledge the importance of the exercise prescription.  Exercise Prescription Goal: Starting with aerobic activity 30 plus minutes a day, 3 days per week for initial exercise prescription. Provide home exercise prescription and guidelines that participant acknowledges understanding prior to discharge.  Activity Barriers & Risk Stratification:     Activity Barriers & Cardiac Risk Stratification - 07/06/15 1026    Activity Barriers & Cardiac Risk Stratification   Activity Barriers Deconditioning;Muscular Weakness;Shortness of Breath      6 Minute Walk:     6 Minute Walk      07/09/15 0758       6 Minute Walk   Phase Initial     Distance 678 feet     Walk Time 6 minutes     # of Rest Breaks 0     MPH 1.28     METS 1.73     RPE 7     Perceived Dyspnea  0     VO2 Peak 6.08     Symptoms No     Resting HR 80 bpm     Resting BP 138/78 mmHg     Max Ex. HR 96 bpm     Max Ex. BP 120/80 mmHg     2 Minute Post BP 120/80 mmHg     Interval HR   Baseline HR 80     1 Minute HR 83     2 Minute HR 81     3 Minute HR 82     4 Minute HR 96     5 Minute HR 90     6 Minute HR 91     2 Minute Post HR 65     Interval Heart Rate? Yes     Interval Oxygen   Interval Oxygen? Yes     Baseline Oxygen Saturation % 98 %     Baseline Liters of Oxygen 0 L     1 Minute Oxygen Saturation % 97 %     1 Minute Liters of Oxygen 0 L     2 Minute Oxygen Saturation % 97 %     2 Minute Liters of Oxygen 0 L     3 Minute Oxygen Saturation % 96 %  3 Minute Liters of Oxygen 0 L     4 Minute Oxygen Saturation % 96 %     4 Minute Liters of Oxygen 0 L     5 Minute Oxygen Saturation % 94 %     5 Minute Liters of Oxygen 0 L      6 Minute Oxygen Saturation % 96 %     6 Minute Liters of Oxygen 0 L     2 Minute Post Oxygen Saturation % 98 %     2 Minute Post Liters of Oxygen 0 L        Initial Exercise Prescription:     Initial Exercise Prescription - 07/09/15 0800    Date of Initial Exercise RX and Referring Provider   Date 07/09/15   Oxygen   Oxygen --  room air   Bike   Level 0.2   Minutes 15   NuStep   Level 1   Minutes 15   METs 1.5   Track   Laps 5   Minutes 15   Prescription Details   Frequency (times per week) 2   Duration Progress to 45 minutes of aerobic exercise without signs/symptoms of physical distress   Intensity   THRR 40-80% of Max Heartrate 58-117   Ratings of Perceived Exertion 11-13   Perceived Dyspnea 0-4   Progression   Progression Continue to progress workloads to maintain intensity without signs/symptoms of physical distress.   Resistance Training   Training Prescription Yes   Weight orange bands   Reps 10-12      Perform Capillary Blood Glucose checks as needed.  Exercise Prescription Changes:     Exercise Prescription Changes      07/14/15 1500 07/16/15 1600 07/21/15 1500 07/23/15 1600 07/28/15 1400   Exercise Review   Progression No Yes Yes  Yes   Response to Exercise   Blood Pressure (Admit) 110/62 mmHg 120/70 mmHg 148/80 mmHg 100/70 mmHg 108/60 mmHg   Blood Pressure (Exercise) 124/78 mmHg 138/94 mmHg 132/70 mmHg 100/62 mmHg 98/60 mmHg   Blood Pressure (Exit) 96/60 mmHg 96/60 mmHg 132/68 mmHg 134/74 mmHg 110/60 mmHg   Heart Rate (Admit) 75 bpm 69 bpm 60 bpm 64 bpm 62 bpm   Heart Rate (Exercise) 79 bpm 82 bpm 70 bpm 66 bpm 107 bpm   Heart Rate (Exit) 67 bpm 60 bpm 56 bpm 62 bpm 67 bpm   Oxygen Saturation (Admit) 97 % 99 % 99 % 98 % 95 %   Oxygen Saturation (Exercise) 95 % 97 % 98 % 98 % 91 %   Oxygen Saturation (Exit) 99 % 100 % 98 % 97 % 97 %   Rating of Perceived Exertion (Exercise) 13 13 13 13 13    Perceived Dyspnea (Exercise) 1 0 1 0 1   Comments      --  Reviewed patients Home Exercise Prescription   Duration Progress to 45 minutes of aerobic exercise without signs/symptoms of physical distress Progress to 45 minutes of aerobic exercise without signs/symptoms of physical distress Progress to 45 minutes of aerobic exercise without signs/symptoms of physical distress Progress to 45 minutes of aerobic exercise without signs/symptoms of physical distress Progress to 45 minutes of aerobic exercise without signs/symptoms of physical distress   Intensity THRR unchanged THRR unchanged THRR unchanged THRR unchanged THRR unchanged   Progression   Progression Continue to progress workloads to maintain intensity without signs/symptoms of physical distress. Continue to progress workloads to maintain intensity without signs/symptoms of physical distress. Continue to  progress workloads to maintain intensity without signs/symptoms of physical distress. Continue to progress workloads to maintain intensity without signs/symptoms of physical distress. Continue to progress workloads to maintain intensity without signs/symptoms of physical distress.   Resistance Training   Training Prescription Yes Yes Yes Yes Yes   Weight orange bands ornage bands ornage bands orange bands orange bands   Reps 10-12 10-12 10-12 10-12 10-12   Bike   Level 0.2 0.3 0.5  0.5   Minutes 15 15 15  15    NuStep   Level 1 1 2 2 3    Minutes 15 15 15 15 15    METs 1 1.6 1.6 1.4 1.2   Track   Laps 8  10  9    Minutes 15  15  15    Home Exercise Plan   Plans to continue exercise at     Home   Frequency     Add 3 additional days to program exercise sessions.     07/30/15 1500 08/04/15 1500 08/06/15 1500 08/13/15 1600 08/18/15 1540   Exercise Review   Progression Yes  Yes     Response to Exercise   Blood Pressure (Admit) 112/70 mmHg 122/70 mmHg 140/80 mmHg 140/80 mmHg 120/70 mmHg   Blood Pressure (Exercise) 100/60 mmHg 120/70 mmHg 118/74 mmHg 138/82 mmHg 120/64 mmHg   Blood Pressure (Exit)  106/60 mmHg 116/64 mmHg 116/60 mmHg 118/70 mmHg 100/60 mmHg   Heart Rate (Admit) 61 bpm 62 bpm 62 bpm 59 bpm 63 bpm   Heart Rate (Exercise) 95 bpm 74 bpm 77 bpm 71 bpm 84 bpm   Heart Rate (Exit) 68 bpm 70 bpm 64 bpm 57 bpm 67 bpm   Oxygen Saturation (Admit) 96 % 99 % 96 % 98 % 97 %   Oxygen Saturation (Exercise) 94 % 95 % 94 % 96 % 96 %   Oxygen Saturation (Exit) 98 % 94 % 98 % 98 % 98 %   Rating of Perceived Exertion (Exercise) 11 15 13 13 17    Perceived Dyspnea (Exercise) 1 2 1 1 2    Duration Progress to 45 minutes of aerobic exercise without signs/symptoms of physical distress Progress to 45 minutes of aerobic exercise without signs/symptoms of physical distress Progress to 45 minutes of aerobic exercise without signs/symptoms of physical distress Progress to 45 minutes of aerobic exercise without signs/symptoms of physical distress Progress to 45 minutes of aerobic exercise without signs/symptoms of physical distress   Intensity THRR unchanged THRR unchanged THRR unchanged THRR unchanged THRR unchanged   Progression   Progression Continue to progress workloads to maintain intensity without signs/symptoms of physical distress. Continue to progress workloads to maintain intensity without signs/symptoms of physical distress. Continue progressive overload as per policy without signs/symptoms or physical distress. Continue progressive overload as per policy without signs/symptoms or physical distress. Continue progressive overload as per policy without signs/symptoms or physical distress.   Resistance Training   Training Prescription Yes Yes Yes Yes Yes   Weight orange bands orange bands' orange bands orange bands orange bands   Reps 10-12 10-12 10-12 10-12 10-12   Interval Training   Interval Training No No No No No   Bike   Level 0.7 0.7  0.7 0.7   Minutes 15 15   15    NuStep   Level 3 3 4   chest pain relieved with rest didn't return 4 4   Minutes 15 15 15 15 15    METs 1.5 1.8 1.2 2 1.9    Track  Laps 13 14 8  13    Minutes 15 15 15  15      08/20/15 1600           Response to Exercise   Blood Pressure (Admit) 130/60 mmHg       Blood Pressure (Exercise) 124/72 mmHg       Blood Pressure (Exit) 100/60 mmHg       Heart Rate (Admit) 65 bpm       Heart Rate (Exercise) 80 bpm       Heart Rate (Exit) 70 bpm       Oxygen Saturation (Admit) 98 %       Oxygen Saturation (Exercise) 94 %       Oxygen Saturation (Exit) 98 %       Rating of Perceived Exertion (Exercise) 13       Perceived Dyspnea (Exercise) 2       Duration Progress to 45 minutes of aerobic exercise without signs/symptoms of physical distress       Intensity THRR unchanged       Progression   Progression Continue progressive overload as per policy without signs/symptoms or physical distress.       Resistance Training   Training Prescription Yes       Weight orange bands       Reps 10-12       Interval Training   Interval Training No       NuStep   Level 4       Minutes 15       METs 2       Track   Laps 10       Minutes 15          Exercise Comments:     Exercise Comments      07/23/15 0910 08/20/15 0920         Exercise Comments Patient is starting off well with exercise. Will continue to monitor exericise progression.  Patient is cont. to progress in exercise intensity. Patient is averaging about 14 laps in 15 minutes. Will continue to monitor and progress as appropriate.         Discharge Exercise Prescription (Final Exercise Prescription Changes):     Exercise Prescription Changes - 08/20/15 1600    Response to Exercise   Blood Pressure (Admit) 130/60 mmHg   Blood Pressure (Exercise) 124/72 mmHg   Blood Pressure (Exit) 100/60 mmHg   Heart Rate (Admit) 65 bpm   Heart Rate (Exercise) 80 bpm   Heart Rate (Exit) 70 bpm   Oxygen Saturation (Admit) 98 %   Oxygen Saturation (Exercise) 94 %   Oxygen Saturation (Exit) 98 %   Rating of Perceived Exertion (Exercise) 13   Perceived Dyspnea  (Exercise) 2   Duration Progress to 45 minutes of aerobic exercise without signs/symptoms of physical distress   Intensity THRR unchanged   Progression   Progression Continue progressive overload as per policy without signs/symptoms or physical distress.   Resistance Training   Training Prescription Yes   Weight orange bands   Reps 10-12   Interval Training   Interval Training No   NuStep   Level 4   Minutes 15   METs 2   Track   Laps 10   Minutes 15       Nutrition:  Target Goals: Understanding of nutrition guidelines, daily intake of sodium 1500mg , cholesterol 200mg , calories 30% from fat and 7% or less from saturated fats, daily to have 5 or more  servings of fruits and vegetables.  Biometrics:     Pre Biometrics - 07/06/15 1032    Pre Biometrics   Grip Strength 20 kg       Nutrition Therapy Plan and Nutrition Goals:     Nutrition Therapy & Goals - 07/23/15 1526    Nutrition Therapy   Diet High Calorie, High Protein   Personal Nutrition Goals   Personal Goal #1 Wt gain of 0.5-2 lb/week to a goal wt of 115 lb at graduation from [place], educate and counsel regarding individualized specific dietary modifications aiming towards targeted core components such as weight, hypertension, lipid management, diabetes, heart failure and other comorbidities.;Nutrition handout(s) given to patient.  Handouts given for: High Calorie, High Protein diet ; Suggestions for Increasing Calories and Protein; and High Calorie, High Protein recipes   Expected Outcomes Short Term Goal: Understand basic principles of dietary content, such as calories, fat, sodium, cholesterol and nutrients.;Long Term Goal: Adherence to prescribed nutrition plan.      Nutrition Discharge: Rate Your Plate Scores:     Nutrition Assessments - 07/23/15 1517    Rate Your Plate Scores   Pre Score 41      Psychosocial: Target Goals: Acknowledge presence  or absence of depression, maximize coping skills, provide positive support system. Participant is able to verbalize types and ability to use techniques and skills needed for reducing stress and depression.  Initial Review & Psychosocial Screening:     Initial Psych Review & Screening - 07/06/15 1044    Initial Review   Current issues with History of Depression   Family Dynamics   Good Support System? Yes   Barriers   Psychosocial barriers to participate in program There are no identifiable barriers or psychosocial needs.   Screening Interventions   Interventions Encouraged to exercise      Quality of Life Scores:     Quality of Life - 07/08/15 1020    Quality of Life Scores   Health/Function Pre 12.4 %   Socioeconomic Pre 16 %   Psych/Spiritual Pre 23.14 %   Family Pre 30 %   GLOBAL Pre 17.49 %      PHQ-9:     Recent Review Flowsheet Data    Depression screen [facility] 2/9 07/06/2015   Decreased Interest 0   Down, Depressed, Hopeless 1   PHQ - 2 Score 1      Psychosocial Evaluation and Intervention:     Psychosocial Evaluation - 07/06/15 1044    Psychosocial Evaluation & Interventions   Interventions Relaxation education;Encouraged to exercise with the program and follow exercise prescription   Comments Continue Lexapro, begin exercising in our program   Continued Psychosocial Services Needed No      Psychosocial Re-Evaluation:     Psychosocial Re-Evaluation      08/18/15 1641           Psychosocial Re-Evaluation   Interventions Encouraged to attend Pulmonary Rehabilitation for the exercise       Comments --  no psychosocial issues at this time       Continued Psychosocial Services Needed No         Education: Education Goals: Education classes will be provided on a weekly basis, covering required topics. Participant will state understanding/return demonstration of topics presented.  Learning Barriers/Preferences:     Learning Barriers/Preferences -  07/06/15 1026    Learning Barriers/Preferences   Learning Barriers None   Learning Preferences Written Material;Verbal  Instruction;Skilled Demonstration;Audio      Education Topics: Risk Factor Reduction:  -Group instruction that is supported by a PowerPoint presentation. Instructor discusses the definition of a risk factor, different risk factors for pulmonary disease, and how the heart and lungs work together.     Nutrition for Pulmonary Patient:  -Group instruction provided by PowerPoint slides, verbal discussion, and written materials to support subject matter. The instructor gives an explanation and review of healthy diet recommendations, which includes a discussion on weight management, recommendations for fruit and vegetable consumption, as well as protein, fluid, caffeine, fiber, sodium, sugar, and alcohol. Tips for eating when patients are short of breath are discussed.   Pursed Lip Breathing:  -Group instruction that is supported by demonstration and informational handouts. Instructor discusses the benefits of pursed lip and diaphragmatic breathing and detailed demonstration on how to preform both.            PULMONARY REHAB OTHER RESPIRATORY from 08/20/2015 in [place]   Date  07/16/15   Educator  RT   Instruction Review Code  2- meets goals/outcomes      Oxygen Safety:  -Group instruction provided by PowerPoint, verbal discussion, and written material to support subject matter. There is an overview of "What is Oxygen" and "Why do we need it".  Instructor also reviews how to create a safe environment for oxygen use, the importance of using oxygen as prescribed, and the risks of noncompliance. There is a brief discussion on traveling with oxygen and resources the patient may utilize.   Oxygen Equipment:  -Group instruction provided by [facility] Staff utilizing handouts, written materials, and equipment demonstrations.      PULMONARY REHAB  OTHER RESPIRATORY from 08/20/2015 in [place]   Date  07/23/15   Educator  Ace Gins Rep   Instruction Review Code  2- meets goals/outcomes      Signs and Symptoms:  -Group instruction provided by written material and verbal discussion to support subject matter. Warning signs and symptoms of infection, stroke, and heart attack are reviewed and when to call the physician/911 reinforced. Tips for preventing the spread of infection discussed.   Advanced Directives:  -Group instruction provided by verbal instruction and written material to support subject matter. Instructor reviews Advanced Directive laws and proper instruction for filling out document.      PULMONARY REHAB OTHER RESPIRATORY from 08/20/2015 in [place]   Date  08/20/15   Educator  Jeanella Craze   Instruction Review Code  2- meets goals/outcomes      Pulmonary Video:  -Group video education that reviews the importance of medication and oxygen compliance, exercise, good nutrition, pulmonary hygiene, and pursed lip and diaphragmatic breathing for the pulmonary patient.   Exercise for the Pulmonary Patient:  -Group instruction that is supported by a PowerPoint presentation. Instructor discusses benefits of exercise, core components of exercise, frequency, duration, and intensity of an exercise routine, importance of utilizing pulse oximetry during exercise, safety while exercising, and options of places to exercise outside of rehab.     Pulmonary Medications:  -Verbally interactive group education provided by instructor with focus on inhaled medications and proper administration.      PULMONARY REHAB OTHER RESPIRATORY from 08/20/2015 in [place]   Date  07/16/15   Educator  RT   Instruction Review Code  2- meets goals/outcomes      Anatomy and Physiology of the Respiratory  System and Intimacy:  -Group instruction provided by  PowerPoint, verbal discussion, and written material to support subject matter. Instructor reviews respiratory cycle and anatomical components of the respiratory system and their functions. Instructor also reviews differences in obstructive and restrictive respiratory diseases with examples of each. Intimacy, Sex, and Sexuality differences are reviewed with a discussion on how relationships can change when diagnosed with pulmonary disease. Common sexual concerns are reviewed.      PULMONARY REHAB OTHER RESPIRATORY from 08/20/2015 in [place]   Date  08/13/15   Educator  RN   Instruction Review Code  2- meets goals/outcomes      Knowledge Questionnaire Score:     Knowledge Questionnaire Score - 07/08/15 1019    Knowledge Questionnaire Score   Pre Score 9/13      Core Components/Risk Factors/Patient Goals at Admission:     Personal Goals and Risk Factors at Admission - 07/06/15 1032    Core Components/Risk Factors/Patient Goals on Admission   Increase Strength and Stamina Yes   Intervention Provide advice, education, support and counseling about physical activity/exercise needs.;Develop an individualized exercise prescription for aerobic and resistive training based on initial evaluation findings, risk stratification, comorbidities and participant's personal goals.   Expected Outcomes Achievement of increased cardiorespiratory fitness and enhanced flexibility, muscular endurance and strength shown through measurements of functional capacity and personal statement of participant.   Improve shortness of breath with ADL's Yes   Intervention Provide education, individualized exercise plan and daily activity instruction to help decrease symptoms of SOB with activities of daily living.   Expected Outcomes Short Term: Achieves a reduction of symptoms when performing activities of daily living.   Intervention Provide education, demonstration and support about  specific breathing techniuqes utilized for more efficient breathing. Include techniques such as pursed lipped breathing, diaphragmatic breathing and self-pacing activity.   Expected Outcomes Short Term: Participant will be able to demonstrate and use breathing techniques as needed throughout daily activities.   Personal Goal Other Yes   Personal Goal To be able to have the energy to do more house work and gardening.     Intervention Increase workloads on equipment as tolerated to increase her strength and stamina   Expected Outcomes To have the energy to perform ADL's at home.      Core Components/Risk Factors/Patient Goals Review:      Goals and Risk Factor Review      07/06/15 1043 08/18/15 1639         Core Components/Risk Factors/Patient Goals Review   Personal Goals Review Increase Strength and Stamina;Develop more efficient breathing techniques such as purse lipped breathing and diaphragmatic breathing and practicing self-pacing with activity.;Improve shortness of breath with ADL's       Review  --  she has attended 10 exercise sessions and has noticed a definite increase in her strength and stamina,.      Expected Outcomes  further improvement in strength and stamina by increasing workloads, purse lip breathing is improving         Core Components/Risk Factors/Patient Goals at Discharge (Final Review):      Goals and Risk Factor Review - 08/18/15 1639    Core Components/Risk Factors/Patient Goals Review   Review --  she has attended 10 exercise sessions and has noticed a definite increase in her strength and stamina,.   Expected Outcomes further improvement in strength and stamina by increasing workloads, purse lip breathing is improving      ITP Comments:  Comments: ITP REVIEW Pt is making expected progress toward personal goals after completing 11 sessions.   Recommend continued exercise, life style modification, education, and utilization of breathing techniques to  increase stamina and strength and decrease shortness of breath with exertion.

## 2015-08-20 NOTE — Progress Notes (Signed)
Daily Session Note  Patient Details  Name: Gina Fritz MRN: [id number] Date of Birth: [birth year] Referring Provider:    Encounter Date: 08/20/2015  Check In:     Session Check In - 08/20/15 1353    Check-In   Location [facility]   Staff Present Rosebud Poles, RN, BSN;Lisa Ysidro Evert, RN;Portia Rollene Rotunda, RN, BSN;Jillyn Stacey, MS, ACSM RCEP, Exercise Physiologist   Supervising physician immediately available to respond to emergencies [facility] immediately available   Physician(s) Dr. Waldron Labs   Medication changes reported     No   Fall or balance concerns reported    No   Warm-up and Cool-down Performed as group-led instruction   Resistance Training Performed Yes   VAD Patient? No   Pain Assessment   Currently in Pain? No/denies   Multiple Pain Sites No      Capillary Blood Glucose: No results found for this or any previous visit (from the past 24 hour(s)).      Exercise Prescription Changes - 08/20/15 1600    Response to Exercise   Blood Pressure (Admit) 130/60 mmHg   Blood Pressure (Exercise) 124/72 mmHg   Blood Pressure (Exit) 100/60 mmHg   Heart Rate (Admit) 65 bpm   Heart Rate (Exercise) 80 bpm   Heart Rate (Exit) 70 bpm   Oxygen Saturation (Admit) 98 %   Oxygen Saturation (Exercise) 94 %   Oxygen Saturation (Exit) 98 %   Rating of Perceived Exertion (Exercise) 13   Perceived Dyspnea (Exercise) 2   Duration Progress to 45 minutes of aerobic exercise without signs/symptoms of physical distress   Intensity THRR unchanged   Progression   Progression Continue progressive overload as per policy without signs/symptoms or physical distress.   Resistance Training   Training Prescription Yes   Weight orange bands   Reps 10-12   Interval Training   Interval Training No   NuStep   Level 4   Minutes 15   METs 2   Track   Laps 10   Minutes 15     Goals Met:  Exercise tolerated well No report of cardiac concerns or symptoms Strength  training completed today  Goals Unmet:  Not Applicable  Comments: Service time is from 1:30PM to 3:15PM    Dr. Rush Farmer is Medical Director for Pulmonary Rehab at [facility].

## 2015-08-21 ENCOUNTER — Encounter (INDEPENDENT_AMBULATORY_CARE_PROVIDER_SITE_OTHER): Payer: Medicare Other | Admitting: Ophthalmology

## 2015-08-21 DIAGNOSIS — H353211 Exudative age-related macular degeneration, right eye, with active choroidal neovascularization: Secondary | ICD-10-CM

## 2015-08-21 DIAGNOSIS — H43813 Vitreous degeneration, bilateral: Secondary | ICD-10-CM | POA: Diagnosis not present

## 2015-08-21 DIAGNOSIS — H35033 Hypertensive retinopathy, bilateral: Secondary | ICD-10-CM | POA: Diagnosis not present

## 2015-08-21 DIAGNOSIS — I10 Essential (primary) hypertension: Secondary | ICD-10-CM | POA: Diagnosis not present

## 2015-08-21 DIAGNOSIS — H353122 Nonexudative age-related macular degeneration, left eye, intermediate dry stage: Secondary | ICD-10-CM | POA: Diagnosis not present

## 2015-08-25 ENCOUNTER — Encounter (HOSPITAL_COMMUNITY): Payer: Medicare Other

## 2015-08-27 ENCOUNTER — Encounter (HOSPITAL_COMMUNITY)
Admission: RE | Admit: 2015-08-27 | Discharge: 2015-08-27 | Disposition: A | Discharge status: Home / Self Care | Payer: Medicare Other | Source: Ambulatory Visit | Attending: Pulmonary Disease | Admitting: Pulmonary Disease

## 2015-08-27 VITALS — Wt 116.0 lb

## 2015-08-27 DIAGNOSIS — J439 Emphysema, unspecified: Secondary | ICD-10-CM | POA: Diagnosis not present

## 2015-08-27 NOTE — Progress Notes (Signed)
Daily Session Note  Patient Details  Name: Gina Fritz MRN: [id number] Date of Birth: September 13, 1940 Referring Provider:    Encounter Date: 08/27/2015  Check In:     Session Check In - 08/27/15 1353    Check-In   [facility]   Staff Present Rosebud Poles, RN, BSN;Molly diVincenzo, MS, ACSM RCEP, Exercise Physiologist;Naeemah Jasmer Ysidro Evert, RN;Portia Rollene Rotunda, RN, BSN   Supervising physician immediately available to respond to emergencies [facility] immediately available   Physician(s) Dr. Eliseo Squires   Medication changes reported     No   Fall or balance concerns reported    No   Warm-up and Cool-down Performed as group-led instruction   Resistance Training Performed Yes   VAD Patient? No   Pain Assessment   Currently in Pain? No/denies   Multiple Pain Sites No      Capillary Blood Glucose: No results found for this or any previous visit (from the past 24 hour(s)).      Exercise Prescription Changes - 08/27/15 1500    Response to Exercise   Blood Pressure (Admit) 92/68 mmHg   Blood Pressure (Exercise) 148/78 mmHg   Blood Pressure (Exit) 124/72 mmHg   Heart Rate (Admit) 61 bpm   Heart Rate (Exercise) 122 bpm   Heart Rate (Exit) 63 bpm   Oxygen Saturation (Admit) 98 %   Oxygen Saturation (Exercise) 96 %   Oxygen Saturation (Exit) 98 %   Rating of Perceived Exertion (Exercise) 14   Perceived Dyspnea (Exercise) 1   Duration Progress to 45 minutes of aerobic exercise without signs/symptoms of physical distress   Intensity THRR unchanged   Progression   Progression Continue to progress workloads to maintain intensity without signs/symptoms of physical distress.   Resistance Training   Training Prescription Yes   Weight orange bands   Reps 10-12   Interval Training   Interval Training No   Bike   Level 0.7   Minutes 15   Track   Laps 3   Minutes 15     Goals Met:  Exercise tolerated well No report of cardiac concerns or symptoms Strength training  completed today  Goals Unmet:  Not Applicable  Comments: Service time is from 1330 to 1515    Dr. Rush Farmer is Medical Director for Pulmonary Rehab at [facility].

## 2015-09-01 ENCOUNTER — Encounter (HOSPITAL_COMMUNITY)
Admission: RE | Admit: 2015-09-01 | Discharge: 2015-09-01 | Disposition: A | Discharge status: Home / Self Care | Payer: Medicare Other | Source: Ambulatory Visit | Attending: Pulmonary Disease | Admitting: Pulmonary Disease

## 2015-09-01 VITALS — Wt 115.3 lb

## 2015-09-01 DIAGNOSIS — J439 Emphysema, unspecified: Secondary | ICD-10-CM | POA: Diagnosis not present

## 2015-09-01 NOTE — Progress Notes (Signed)
Daily Session Note  Patient Details  Name: Gina Fritz MRN: [id number] Date of Birth: 09-28-1940 Referring Provider:    Encounter Date: 09/01/2015  Check In:     Session Check In - 09/01/15 1334    Check-In   [facility]   Staff Present Rosebud Poles, RN, Luisa Hart, RN, BSN;Ramon Dredge, RN, MHA;Molly diVincenzo, MS, ACSM RCEP, Exercise Physiologist   Supervising physician immediately available to respond to emergencies [facility] immediately available   Physician(s) Dr. Marily Memos   Medication changes reported     No   Fall or balance concerns reported    No   Warm-up and Cool-down Performed as group-led instruction   Resistance Training Performed Yes   VAD Patient? No   Pain Assessment   Currently in Pain? No/denies   Multiple Pain Sites No      Capillary Blood Glucose: No results found for this or any previous visit (from the past 24 hour(s)).      Exercise Prescription Changes - 09/01/15 1527    Response to Exercise   Blood Pressure (Admit) 100/72 mmHg   Blood Pressure (Exercise) 140/78 mmHg   Blood Pressure (Exit) 104/60 mmHg   Heart Rate (Admit) 67 bpm   Heart Rate (Exercise) 83 bpm   Heart Rate (Exit) 70 bpm   Oxygen Saturation (Admit) 97 %   Oxygen Saturation (Exercise) 95 %   Oxygen Saturation (Exit) 97 %   Rating of Perceived Exertion (Exercise) 15   Perceived Dyspnea (Exercise) 2   Duration Progress to 45 minutes of aerobic exercise without signs/symptoms of physical distress   Intensity THRR unchanged   Progression   Progression Continue to progress workloads to maintain intensity without signs/symptoms of physical distress.   Resistance Training   Training Prescription Yes   Weight orange bands   Reps 10-12   Interval Training   Interval Training No   Bike   Level 0.7   Minutes 15   NuStep   Level 4   METs 1.9   Track   Laps 10   Minutes 15     Goals Met:  Improved SOB with ADL's Using PLB  without cueing & demonstrates good technique Exercise tolerated well No report of cardiac concerns or symptoms Strength training completed today  Goals Unmet:  Not Applicable  Comments: Service time is from 1330 to 1500   Dr. Rush Farmer is Medical Director for Pulmonary Rehab at [facility].

## 2015-09-03 ENCOUNTER — Encounter (HOSPITAL_COMMUNITY)
Admission: RE | Admit: 2015-09-03 | Discharge: 2015-09-03 | Disposition: A | Discharge status: Home / Self Care | Payer: Medicare Other | Source: Ambulatory Visit | Attending: Pulmonary Disease | Admitting: Pulmonary Disease

## 2015-09-03 VITALS — Wt 117.1 lb

## 2015-09-03 DIAGNOSIS — J439 Emphysema, unspecified: Secondary | ICD-10-CM | POA: Diagnosis not present

## 2015-09-03 NOTE — Progress Notes (Signed)
Daily Session Note  Patient Details  Name: Gina Fritz MRN: [id number] Date of Birth: [birth year] Referring Provider:    Encounter Date: 09/03/2015  Check In:     Session Check In - 09/03/15 1350    Check-In   [facility]   Staff Present Rosebud Poles, RN, BSN;Aneliese Beaudry Ysidro Evert, RN;Portia Rollene Rotunda, RN, BSN;Ramon Dredge, RN, MHA;Molly diVincenzo, MS, ACSM RCEP, Exercise Physiologist   Supervising physician immediately available to respond to emergencies [facility] immediately available   Physician(s) Dr. Renaee Munda   Medication changes reported     No   Fall or balance concerns reported    No   Warm-up and Cool-down Performed as group-led instruction   Resistance Training Performed Yes   VAD Patient? No   VAD patient   Has back up controller? No   Pain Assessment   Currently in Pain? No/denies   Multiple Pain Sites No      Capillary Blood Glucose: No results found for this or any previous visit (from the past 24 hour(s)).      Exercise Prescription Changes - 09/03/15 1600    Response to Exercise   Blood Pressure (Admit) 98/68 mmHg   Blood Pressure (Exercise) 124/84 mmHg   Blood Pressure (Exit) 100/60 mmHg   Heart Rate (Admit) 64 bpm   Heart Rate (Exercise) 72 bpm   Heart Rate (Exit) 61 bpm   Oxygen Saturation (Admit) 97 %   Oxygen Saturation (Exercise) 93 %   Oxygen Saturation (Exit) 97 %   Rating of Perceived Exertion (Exercise) 15   Perceived Dyspnea (Exercise) 3   Duration Progress to 45 minutes of aerobic exercise without signs/symptoms of physical distress   Intensity THRR unchanged   Progression   Progression Continue to progress workloads to maintain intensity without signs/symptoms of physical distress.   Resistance Training   Training Prescription Yes   Weight orange bands   Reps 10-12   Interval Training   Interval Training No   NuStep   Level 4   Minutes 15   METs 2   Track   Laps 16   Minutes 15     Goals Met:   Exercise tolerated well No report of cardiac concerns or symptoms Strength training completed today  Goals Unmet:  Not Applicable  Comments: Service time is from 1330 to 1530    Dr. Rush Farmer is Medical Director for Pulmonary Rehab at [facility].

## 2015-09-08 ENCOUNTER — Encounter (HOSPITAL_COMMUNITY)
Admission: RE | Admit: 2015-09-08 | Discharge: 2015-09-08 | Disposition: A | Discharge status: Home / Self Care | Payer: Medicare Other | Source: Ambulatory Visit | Attending: Pulmonary Disease | Admitting: Pulmonary Disease

## 2015-09-08 VITALS — Wt 116.8 lb

## 2015-09-08 DIAGNOSIS — J439 Emphysema, unspecified: Secondary | ICD-10-CM

## 2015-09-08 NOTE — Progress Notes (Signed)
Daily Session Note  Patient Details  Name: Gina Fritz MRN: [id number] Date of Birth: 03/01/1941 Referring Provider:    Encounter Date: 09/08/2015  Check In:     Session Check In - 09/08/15 1327    Check-In   [facility]   Staff Present Rosebud Poles, RN, BSN;Azeneth Carbonell Ysidro Evert, RN;Portia Rollene Rotunda, RN, BSN;Ramon Dredge, RN, MHA;Molly diVincenzo, MS, ACSM RCEP, Exercise Physiologist   Supervising physician immediately available to respond to emergencies [facility] immediately available   Physician(s) Dr. Marily Memos   Medication changes reported     No   Fall or balance concerns reported    No   Warm-up and Cool-down Performed as group-led instruction   Resistance Training Performed Yes   VAD Patient? No   Pain Assessment   Currently in Pain? No/denies   Multiple Pain Sites No      Capillary Blood Glucose: No results found for this or any previous visit (from the past 24 hour(s)).      Exercise Prescription Changes - 09/08/15 1500    Response to Exercise   Blood Pressure (Admit) 108/70 mmHg   Blood Pressure (Exercise) 122/70 mmHg   Blood Pressure (Exit) 122/64 mmHg   Heart Rate (Admit) 62 bpm   Heart Rate (Exercise) 84 bpm   Heart Rate (Exit) 77 bpm   Oxygen Saturation (Admit) 98 %   Oxygen Saturation (Exercise) 92 %   Oxygen Saturation (Exit) 97 %   Rating of Perceived Exertion (Exercise) 15   Perceived Dyspnea (Exercise) 2   Duration Progress to 45 minutes of aerobic exercise without signs/symptoms of physical distress   Intensity THRR unchanged   Progression   Progression Continue to progress workloads to maintain intensity without signs/symptoms of physical distress.   Resistance Training   Training Prescription Yes   Weight orange bands   Reps 10-12   Interval Training   Interval Training No   Bike   Level 0.7   Minutes 15   NuStep   Level 4   Minutes 15   METs 2.1   Track   Laps 10   Minutes 15     Goals Met:   Exercise tolerated well No report of cardiac concerns or symptoms Strength training completed today  Goals Unmet:  Not Applicable  Comments: Service time is from 1330 to 1500    Dr. Rush Farmer is Medical Director for Pulmonary Rehab at [facility].

## 2015-09-10 ENCOUNTER — Encounter (HOSPITAL_COMMUNITY)
Admission: RE | Admit: 2015-09-10 | Discharge: 2015-09-10 | Disposition: A | Discharge status: Home / Self Care | Payer: Medicare Other | Source: Ambulatory Visit | Attending: Pulmonary Disease | Admitting: Pulmonary Disease

## 2015-09-10 VITALS — Wt 117.5 lb

## 2015-09-10 DIAGNOSIS — J439 Emphysema, unspecified: Secondary | ICD-10-CM | POA: Diagnosis not present

## 2015-09-10 NOTE — Progress Notes (Signed)
Daily Session Note  Patient Details  Name: Gina Fritz MRN: [id number] Date of Birth: 05-May-1940 Referring Provider:    Encounter Date: 09/10/2015  Check In:     Session Check In - 09/10/15 1522    Check-In   [facility]      Capillary Blood Glucose: No results found for this or any previous visit (from the past 24 hour(s)).      Exercise Prescription Changes - 09/10/15 1522    Response to Exercise   Blood Pressure (Admit) 130/70 mmHg   Blood Pressure (Exercise) 130/66 mmHg   Blood Pressure (Exit) 96/66 mmHg  asymptomatic, H20 encouraged   Heart Rate (Admit) 68 bpm   Heart Rate (Exercise) 91 bpm   Heart Rate (Exit) 92 bpm   Oxygen Saturation (Admit) 88 %   Oxygen Saturation (Exercise) 96 %   Oxygen Saturation (Exit) 92 %   Rating of Perceived Exertion (Exercise) 17   Perceived Dyspnea (Exercise) 1   Duration Progress to 45 minutes of aerobic exercise without signs/symptoms of physical distress   Intensity THRR unchanged   Progression   Progression Continue to progress workloads to maintain intensity without signs/symptoms of physical distress.   Resistance Training   Training Prescription Yes   Weight orange bands   Reps 10-12   Interval Training   Interval Training No   Bike   Level 0.7   Minutes 15   Track   Laps 10   Minutes 15     Goals Met:  Using PLB without cueing & demonstrates good technique Exercise tolerated well Queuing for purse lip breathing No report of cardiac concerns or symptoms Strength training completed today  Goals Unmet:  Not Applicable  Comments: Service time is from 1330 to 1500   Dr. Rush Farmer is Medical Director for Pulmonary Rehab at [facility].

## 2015-09-15 ENCOUNTER — Encounter (HOSPITAL_COMMUNITY)
Admission: RE | Admit: 2015-09-15 | Discharge: 2015-09-15 | Disposition: A | Discharge status: Home / Self Care | Payer: Medicare Other | Source: Ambulatory Visit | Attending: Pulmonary Disease | Admitting: Pulmonary Disease

## 2015-09-15 DIAGNOSIS — J439 Emphysema, unspecified: Secondary | ICD-10-CM | POA: Diagnosis not present

## 2015-09-15 NOTE — Progress Notes (Signed)
Daily Session Note  Patient Details  Name: SYERRA ABDELRAHMAN MRN: [id number] Date of Birth: Jun 23, 1940 Referring Provider:    Encounter Date: 09/15/2015  Check In:     Session Check In - 09/15/15 1344    Check-In   Location [facility]   Staff Present Su Hilt, MS, ACSM RCEP, Exercise Physiologist;Lisa Ysidro Evert, Felipe Drone, RN, MHA;Portia Rollene Rotunda, RN, Maxcine Ham, RN, BSN   Supervising physician immediately available to respond to emergencies [facility] immediately available   Physician(s) Dr. Marily Memos   Medication changes reported     No   Fall or balance concerns reported    No   Warm-up and Cool-down Performed as group-led instruction   Resistance Training Performed Yes   VAD Patient? No   VAD patient   Has back up controller? No   Pain Assessment   Multiple Pain Sites No      Capillary Blood Glucose: No results found for this or any previous visit (from the past 24 hour(s)).      Exercise Prescription Changes - 09/15/15 1500    Response to Exercise   Blood Pressure (Admit) 120/70 mmHg   Blood Pressure (Exercise) 102/60 mmHg   Blood Pressure (Exit) 110/60 mmHg   Heart Rate (Admit) 66 bpm   Heart Rate (Exercise) 88 bpm   Heart Rate (Exit) 70 bpm   Oxygen Saturation (Admit) 98 %   Oxygen Saturation (Exercise) 94 %   Oxygen Saturation (Exit) 97 %   Rating of Perceived Exertion (Exercise) 13   Perceived Dyspnea (Exercise) 2   Duration Progress to 45 minutes of aerobic exercise without signs/symptoms of physical distress   Intensity THRR unchanged   Progression   Progression Continue to progress workloads to maintain intensity without signs/symptoms of physical distress.   Resistance Training   Training Prescription Yes   Weight orange bands   Reps 10-12   Interval Training   Interval Training No   Bike   Level 0.5  decreased workload due to leg fatigueand RPE of 15   Minutes 15   NuStep   Level 4   Minutes 15   METs 2   Track   Laps 7   Minutes 15     Goals Met:  Improved SOB with ADL's Exercise tolerated well Strength training completed today  Goals Unmet:  Not Applicable  Comments: Service time is from 1330 to 1500    Dr. Rush Farmer is Medical Director for Pulmonary Rehab at [facility].

## 2015-09-15 NOTE — Progress Notes (Signed)
Pulmonary Individual Treatment Plan  Patient Details  Name: Gina Fritz MRN: [id number] Date of Birth: May 09, 1940 Referring Provider:    Initial Encounter Date:       Pulmonary Rehab Walk Test from 07/07/2015 in [place]   Date  07/09/15      Visit Diagnosis: No diagnosis found.  Patient's Home Medications on Admission:   Current outpatient prescriptions:  .  acetaminophen (TYLENOL) 500 MG tablet, Take 1,000 mg by mouth every 6 (six) hours as needed for moderate pain or headache., Disp: , Rfl:  .  albuterol (PROAIR HFA) 108 (90 Base) MCG/ACT inhaler, Inhale 2 puffs into the lungs every 6 (six) hours as needed for wheezing or shortness of breath., Disp: 1 Inhaler, Rfl: 2 .  aspirin EC 81 MG tablet, Take 81 mg by mouth daily., Disp: , Rfl:  .  atorvastatin (LIPITOR) 40 MG tablet, Take 40 mg by mouth daily., Disp: , Rfl:  .  BESIVANCE 0.6 % SUSP, Place 1 drop into the right eye 4 (four) times daily. The day before and the day of the dr visit, Disp: , Rfl:  .  Cholecalciferol (VITAMIN D) 2000 UNITS CAPS, Take 2,000 Units by mouth daily., Disp: , Rfl:  .  DONNATAL 16.2 MG tablet, 16.2 tablets daily., Disp: , Rfl:  .  escitalopram (LEXAPRO) 20 MG tablet, , Disp: , Rfl:  .  fenofibrate micronized (ANTARA) 130 MG capsule, Take 130 mg by mouth daily. Reported on 07/23/2015, Disp: , Rfl: 1 .  fluticasone (FLONASE) 50 MCG/ACT nasal spray, Place 2 sprays into the nose daily., Disp: , Rfl:  .  ibuprofen (ADVIL,MOTRIN) 600 MG tablet, Take 1 tablet (600 mg total) by mouth every 8 (eight) hours as needed., Disp: 15 tablet, Rfl: 0 .  loperamide (IMODIUM) 2 MG capsule, Take 2-4 mg by mouth 4 (four) times daily as needed for diarrhea or loose stools. For upset stomach, Disp: , Rfl:  .  mometasone (ASMANEX) 220 MCG/INH inhaler, Inhale 2 puffs into the lungs daily. Reported on 07/23/2015, Disp: , Rfl:  .  mometasone-formoterol (DULERA) 100-5 MCG/ACT AERO, Inhale 2 puffs into  the lungs 2 (two) times daily., Disp: 1 Inhaler, Rfl: 5 .  montelukast (SINGULAIR) 10 MG tablet, Take 10 mg by mouth daily. , Disp: , Rfl:  .  Multiple Vitamin (MULTIVITAMIN WITH MINERALS) TABS, Take 1 tablet by mouth daily., Disp: , Rfl:  .  Multiple Vitamins-Minerals (ICAPS AREDS 2 PO), Take 1 tablet by mouth 2 (two) times daily., Disp: , Rfl:  .  ranitidine (ZANTAC) 300 MG tablet, Take 300 mg by mouth 2 (two) times daily., Disp: , Rfl:  .  solifenacin (VESICARE) 10 MG tablet, Take 10 mg by mouth daily. , Disp: , Rfl:  .  tamsulosin (FLOMAX) 0.4 MG CAPS capsule, Take 1 capsule (0.4 mg total) by mouth daily after breakfast. (Patient not taking: Reported on 07/23/2015), Disp: 30 capsule, Rfl: 2  Past Medical History: Past Medical History  Diagnosis Date  . Hypertension   . Depression   . Allergic rhinitis   . Rash   . Irritable bowel syndrome   . Sensorineural hearing loss, unilateral   . Subjective tinnitus   . Loss of weight   . Dizziness   . COPD with emphysema ([place])   . Anxiety   . Right ureteral stone   . History of kidney stones   . Colitis     dx 02-05-2015  . CAP (community acquired pneumonia)  dx 02-05-2015  . Pulmonary nodules     benign and stable per CT  . GERD (gastroesophageal reflux disease)   . Hyperlipidemia   . History of chronic sinusitis     Tobacco Use: History  Smoking status  . Former Smoker -- 1.00 packs/day for 25 years  . Types: Cigarettes  . Quit date: 02/05/2015  Smokeless tobacco  . Never Used    Labs:     Recent Review Flowsheet Data    There is no flowsheet data to display.      Capillary Blood Glucose: Lab Results  Component Value Date   GLUCAP 112* 02/09/2015     ADL UCSD:   Pulmonary Function Assessment:     Pulmonary Function Assessment - 07/06/15 1029    Breath   Bilateral Breath Sounds Clear   Shortness of Breath Yes;Limiting activity      Exercise Target Goals:    Exercise Program Goal: Individual  exercise prescription set with THRR, safety & activity barriers. Participant demonstrates ability to understand and report RPE using BORG scale, to self-measure pulse accurately, and to acknowledge the importance of the exercise prescription.  Exercise Prescription Goal: Starting with aerobic activity 30 plus minutes a day, 3 days per week for initial exercise prescription. Provide home exercise prescription and guidelines that participant acknowledges understanding prior to discharge.  Activity Barriers & Risk Stratification:     Activity Barriers & Cardiac Risk Stratification - 07/06/15 1026    Activity Barriers & Cardiac Risk Stratification   Activity Barriers Deconditioning;Muscular Weakness;Shortness of Breath      6 Minute Walk:     6 Minute Walk      07/09/15 0758       6 Minute Walk   Phase Initial     Distance 678 feet     Walk Time 6 minutes     # of Rest Breaks 0     MPH 1.28     METS 1.73     RPE 7     Perceived Dyspnea  0     VO2 Peak 6.08     Symptoms No     Resting HR 80 bpm     Resting BP 138/78 mmHg     Max Ex. HR 96 bpm     Max Ex. BP 120/80 mmHg     2 Minute Post BP 120/80 mmHg     Interval HR   Baseline HR 80     1 Minute HR 83     2 Minute HR 81     3 Minute HR 82     4 Minute HR 96     5 Minute HR 90     6 Minute HR 91     2 Minute Post HR 65     Interval Heart Rate? Yes     Interval Oxygen   Interval Oxygen? Yes     Baseline Oxygen Saturation % 98 %     Baseline Liters of Oxygen 0 L     1 Minute Oxygen Saturation % 97 %     1 Minute Liters of Oxygen 0 L     2 Minute Oxygen Saturation % 97 %     2 Minute Liters of Oxygen 0 L     3 Minute Oxygen Saturation % 96 %     3 Minute Liters of Oxygen 0 L     4 Minute Oxygen Saturation % 96 %     4 Minute Liters of  Oxygen 0 L     5 Minute Oxygen Saturation % 94 %     5 Minute Liters of Oxygen 0 L     6 Minute Oxygen Saturation % 96 %     6 Minute Liters of Oxygen 0 L     2 Minute Post Oxygen  Saturation % 98 %     2 Minute Post Liters of Oxygen 0 L        Initial Exercise Prescription:     Initial Exercise Prescription - 07/09/15 0800    Date of Initial Exercise RX and Referring Provider   Date 07/09/15   Oxygen   Oxygen --  room air   Bike   Level 0.2   Minutes 15   NuStep   Level 1   Minutes 15   METs 1.5   Track   Laps 5   Minutes 15   Prescription Details   Frequency (times per week) 2   Duration Progress to 45 minutes of aerobic exercise without signs/symptoms of physical distress   Intensity   THRR 40-80% of Max Heartrate 58-117   Ratings of Perceived Exertion 11-13   Perceived Dyspnea 0-4   Progression   Progression Continue to progress workloads to maintain intensity without signs/symptoms of physical distress.   Resistance Training   Training Prescription Yes   Weight orange bands   Reps 10-12      Perform Capillary Blood Glucose checks as needed.  Exercise Prescription Changes:      Exercise Prescription Changes      07/14/15 1500 07/16/15 1600 07/21/15 1500 07/23/15 1600 07/28/15 1400   Exercise Review   Progression No Yes Yes  Yes   Response to Exercise   Blood Pressure (Admit) 110/62 mmHg 120/70 mmHg 148/80 mmHg 100/70 mmHg 108/60 mmHg   Blood Pressure (Exercise) 124/78 mmHg 138/94 mmHg 132/70 mmHg 100/62 mmHg 98/60 mmHg   Blood Pressure (Exit) 96/60 mmHg 96/60 mmHg 132/68 mmHg 134/74 mmHg 110/60 mmHg   Heart Rate (Admit) 75 bpm 69 bpm 60 bpm 64 bpm 62 bpm   Heart Rate (Exercise) 79 bpm 82 bpm 70 bpm 66 bpm 107 bpm   Heart Rate (Exit) 67 bpm 60 bpm 56 bpm 62 bpm 67 bpm   Oxygen Saturation (Admit) 97 % 99 % 99 % 98 % 95 %   Oxygen Saturation (Exercise) 95 % 97 % 98 % 98 % 91 %   Oxygen Saturation (Exit) 99 % 100 % 98 % 97 % 97 %   Rating of Perceived Exertion (Exercise) 13 13 13 13 13    Perceived Dyspnea (Exercise) 1 0 1 0 1   Comments     --  Reviewed patients Home Exercise Prescription   Duration Progress to 45 minutes of  aerobic exercise without signs/symptoms of physical distress Progress to 45 minutes of aerobic exercise without signs/symptoms of physical distress Progress to 45 minutes of aerobic exercise without signs/symptoms of physical distress Progress to 45 minutes of aerobic exercise without signs/symptoms of physical distress Progress to 45 minutes of aerobic exercise without signs/symptoms of physical distress   Intensity THRR unchanged THRR unchanged THRR unchanged THRR unchanged THRR unchanged   Progression   Progression Continue to progress workloads to maintain intensity without signs/symptoms of physical distress. Continue to progress workloads to maintain intensity without signs/symptoms of physical distress. Continue to progress workloads to maintain intensity without signs/symptoms of physical distress. Continue to progress workloads to maintain intensity without signs/symptoms of physical distress. Continue to progress  workloads to maintain intensity without signs/symptoms of physical distress.   Resistance Training   Training Prescription Yes Yes Yes Yes Yes   Weight orange bands ornage bands ornage bands orange bands orange bands   Reps 10-12 10-12 10-12 10-12 10-12   Bike   Level 0.2 0.3 0.5  0.5   Minutes 15 15 15  15    NuStep   Level 1 1 2 2 3    Minutes 15 15 15 15 15    METs 1 1.6 1.6 1.4 1.2   Track   Laps 8  10  9    Minutes 15  15  15    Home Exercise Plan   Plans to continue exercise at     Home   Frequency     Add 3 additional days to program exercise sessions.     07/30/15 1500 08/04/15 1500 08/06/15 1500 08/13/15 1600 08/18/15 1540   Exercise Review   Progression Yes  Yes     Response to Exercise   Blood Pressure (Admit) 112/70 mmHg 122/70 mmHg 140/80 mmHg 140/80 mmHg 120/70 mmHg   Blood Pressure (Exercise) 100/60 mmHg 120/70 mmHg 118/74 mmHg 138/82 mmHg 120/64 mmHg   Blood Pressure (Exit) 106/60 mmHg 116/64 mmHg 116/60 mmHg 118/70 mmHg 100/60 mmHg   Heart Rate (Admit) 61  bpm 62 bpm 62 bpm 59 bpm 63 bpm   Heart Rate (Exercise) 95 bpm 74 bpm 77 bpm 71 bpm 84 bpm   Heart Rate (Exit) 68 bpm 70 bpm 64 bpm 57 bpm 67 bpm   Oxygen Saturation (Admit) 96 % 99 % 96 % 98 % 97 %   Oxygen Saturation (Exercise) 94 % 95 % 94 % 96 % 96 %   Oxygen Saturation (Exit) 98 % 94 % 98 % 98 % 98 %   Rating of Perceived Exertion (Exercise) 11 15 13 13 17    Perceived Dyspnea (Exercise) 1 2 1 1 2    Duration Progress to 45 minutes of aerobic exercise without signs/symptoms of physical distress Progress to 45 minutes of aerobic exercise without signs/symptoms of physical distress Progress to 45 minutes of aerobic exercise without signs/symptoms of physical distress Progress to 45 minutes of aerobic exercise without signs/symptoms of physical distress Progress to 45 minutes of aerobic exercise without signs/symptoms of physical distress   Intensity THRR unchanged THRR unchanged THRR unchanged THRR unchanged THRR unchanged   Progression   Progression Continue to progress workloads to maintain intensity without signs/symptoms of physical distress. Continue to progress workloads to maintain intensity without signs/symptoms of physical distress. Continue progressive overload as per policy without signs/symptoms or physical distress. Continue progressive overload as per policy without signs/symptoms or physical distress. Continue progressive overload as per policy without signs/symptoms or physical distress.   Resistance Training   Training Prescription Yes Yes Yes Yes Yes   Weight orange bands orange bands' orange bands orange bands orange bands   Reps 10-12 10-12 10-12 10-12 10-12   Interval Training   Interval Training No No No No No   Bike   Level 0.7 0.7  0.7 0.7   Minutes 15 15   15    NuStep   Level 3 3 4   chest pain relieved with rest didn't return 4 4   Minutes 15 15 15 15 15    METs 1.5 1.8 1.2 2 1.9   Track   Laps 13 14 8  13    Minutes 15 15 15  15      08/20/15 1600 08/27/15 1500  09/01/15 1527 09/03/15  1600 09/08/15 1500   Response to Exercise   Blood Pressure (Admit) 130/60 mmHg 92/68 mmHg 100/72 mmHg 98/68 mmHg 108/70 mmHg   Blood Pressure (Exercise) 124/72 mmHg 148/78 mmHg 140/78 mmHg 124/84 mmHg 122/70 mmHg   Blood Pressure (Exit) 100/60 mmHg 124/72 mmHg 104/60 mmHg 100/60 mmHg 122/64 mmHg   Heart Rate (Admit) 65 bpm 61 bpm 67 bpm 64 bpm 62 bpm   Heart Rate (Exercise) 80 bpm 122 bpm 83 bpm 72 bpm 84 bpm   Heart Rate (Exit) 70 bpm 63 bpm 70 bpm 61 bpm 77 bpm   Oxygen Saturation (Admit) 98 % 98 % 97 % 97 % 98 %   Oxygen Saturation (Exercise) 94 % 96 % 95 % 93 % 92 %   Oxygen Saturation (Exit) 98 % 98 % 97 % 97 % 97 %   Rating of Perceived Exertion (Exercise) 13 14 15 15 15    Perceived Dyspnea (Exercise) 2 1 2 3 2    Duration Progress to 45 minutes of aerobic exercise without signs/symptoms of physical distress Progress to 45 minutes of aerobic exercise without signs/symptoms of physical distress Progress to 45 minutes of aerobic exercise without signs/symptoms of physical distress Progress to 45 minutes of aerobic exercise without signs/symptoms of physical distress Progress to 45 minutes of aerobic exercise without signs/symptoms of physical distress   Intensity THRR unchanged THRR unchanged THRR unchanged THRR unchanged THRR unchanged   Progression   Progression Continue progressive overload as per policy without signs/symptoms or physical distress. Continue to progress workloads to maintain intensity without signs/symptoms of physical distress. Continue to progress workloads to maintain intensity without signs/symptoms of physical distress. Continue to progress workloads to maintain intensity without signs/symptoms of physical distress. Continue to progress workloads to maintain intensity without signs/symptoms of physical distress.   Resistance Training   Training Prescription Yes Yes Yes Yes Yes   Weight orange bands orange bands orange bands orange bands orange  bands   Reps 10-12 10-12 10-12 10-12 10-12   Interval Training   Interval Training No No No No No   Bike   Level  0.7 0.7  0.7   Minutes  15 15  15    NuStep   Level 4  4 4 4    Minutes 15   15 15    METs 2  1.9 2 2.1   Track   Laps 10 3 10 16 10    Minutes 15 15 15 15 15      09/10/15 1522 09/15/15 1500         Response to Exercise   Blood Pressure (Admit) 130/70 mmHg 120/70 mmHg      Blood Pressure (Exercise) 130/66 mmHg 102/60 mmHg      Blood Pressure (Exit) 96/66 mmHg  asymptomatic, H20 encouraged 110/60 mmHg      Heart Rate (Admit) 68 bpm 66 bpm      Heart Rate (Exercise) 91 bpm 88 bpm      Heart Rate (Exit) 92 bpm 70 bpm      Oxygen Saturation (Admit) 88 % 98 %      Oxygen Saturation (Exercise) 96 % 94 %      Oxygen Saturation (Exit) 92 % 97 %      Rating of Perceived Exertion (Exercise) 17 13      Perceived Dyspnea (Exercise) 1 2      Duration Progress to 45 minutes of aerobic exercise without signs/symptoms of physical distress Progress to 45 minutes of aerobic exercise without signs/symptoms of physical distress  Intensity THRR unchanged THRR unchanged      Progression   Progression Continue to progress workloads to maintain intensity without signs/symptoms of physical distress. Continue to progress workloads to maintain intensity without signs/symptoms of physical distress.      Resistance Training   Training Prescription Yes Yes      Weight orange bands orange bands      Reps 10-12 10-12      Interval Training   Interval Training No No      Bike   Level 0.7 0.5  decreased workload due to leg fatigueand RPE of 15      Minutes 15 15      NuStep   Level  4      Minutes  15      METs  2      Track   Laps 10 7      Minutes 15 15         Exercise Comments:      Exercise Comments      07/23/15 0910 08/20/15 0920 09/17/15 0855       Exercise Comments Patient is starting off well with exercise. Will continue to monitor exericise progression.  Patient is  cont. to progress in exercise intensity. Patient is averaging about 14 laps in 15 minutes. Will continue to monitor and progress as appropriate. Patient is cont. to progress exercise intensity. Will cont. to monitor.        Discharge Exercise Prescription (Final Exercise Prescription Changes):     Exercise Prescription Changes - 09/15/15 1500    Response to Exercise   Blood Pressure (Admit) 120/70 mmHg   Blood Pressure (Exercise) 102/60 mmHg   Blood Pressure (Exit) 110/60 mmHg   Heart Rate (Admit) 66 bpm   Heart Rate (Exercise) 88 bpm   Heart Rate (Exit) 70 bpm   Oxygen Saturation (Admit) 98 %   Oxygen Saturation (Exercise) 94 %   Oxygen Saturation (Exit) 97 %   Rating of Perceived Exertion (Exercise) 13   Perceived Dyspnea (Exercise) 2   Duration Progress to 45 minutes of aerobic exercise without signs/symptoms of physical distress   Intensity THRR unchanged   Progression   Progression Continue to progress workloads to maintain intensity without signs/symptoms of physical distress.   Resistance Training   Training Prescription Yes   Weight orange bands   Reps 10-12   Interval Training   Interval Training No   Bike   Level 0.5  decreased workload due to leg fatigueand RPE of 15   Minutes 15   NuStep   Level 4   Minutes 15   METs 2   Track   Laps 7   Minutes 15       Nutrition:  Target Goals: Understanding of nutrition guidelines, daily intake of sodium 1500mg , cholesterol 200mg , calories 30% from fat and 7% or less from saturated fats, daily to have 5 or more servings of fruits and vegetables.  Biometrics:     Pre Biometrics - 07/06/15 1032    Pre Biometrics   Grip Strength 20 kg       Nutrition Therapy Plan and Nutrition Goals:     Nutrition Therapy & Goals - 07/23/15 1526    Nutrition Therapy   Diet High Calorie, High Protein   Personal Nutrition Goals   Personal Goal #1 Wt gain of 0.5-2 lb/week to a goal wt of 115 lb at graduation from [employer], educate  and counsel regarding individualized specific dietary modifications aiming towards targeted core components such as weight, hypertension, lipid management, diabetes, heart failure and other comorbidities.;Nutrition handout(s) given to patient.  Handouts given for: High Calorie, High Protein diet ; Suggestions for Increasing Calories and Protein; and High Calorie, High Protein recipes   Expected Outcomes Short Term Goal: Understand basic principles of dietary content, such as calories, fat, sodium, cholesterol and nutrients.;Long Term Goal: Adherence to prescribed nutrition plan.      Nutrition Discharge: Rate Your Plate Scores:     Nutrition Assessments - 07/23/15 1517    Rate Your Plate Scores   Pre Score 41      Psychosocial: Target Goals: Acknowledge presence or absence of depression, maximize coping skills, provide positive support system. Participant is able to verbalize types and ability to use techniques and skills needed for reducing stress and depression.  Initial Review & Psychosocial Screening:     Initial Psych Review & Screening - 07/06/15 1044    Initial Review   Current issues with History of Depression   Family Dynamics   Good Support System? Yes   Barriers   Psychosocial barriers to participate in program There are no identifiable barriers or psychosocial needs.   Screening Interventions   Interventions Encouraged to exercise      Quality of Life Scores:   PHQ-9:     Recent Review Flowsheet Data    Depression screen [facility] 2/9 07/06/2015   Decreased Interest 0   Down, Depressed, Hopeless 1   PHQ - 2 Score 1      Psychosocial Evaluation and Intervention:     Psychosocial Evaluation - 07/06/15 1044    Psychosocial Evaluation & Interventions   Interventions Relaxation education;Encouraged to exercise with the program and follow exercise prescription   Comments Continue Lexapro, begin exercising  in our program   Continued Psychosocial Services Needed No      Psychosocial Re-Evaluation:     Psychosocial Re-Evaluation      08/18/15 1641 09/10/15 1124         Psychosocial Re-Evaluation   Interventions Encouraged to attend Pulmonary Rehabilitation for the exercise Encouraged to attend Pulmonary Rehabilitation for the exercise      Comments --  no psychosocial issues at this time No psychosocial concerns are identified at this time.      Continued Psychosocial Services Needed No No        Education: Education Goals: Education classes will be provided on a weekly basis, covering required topics. Participant will state understanding/return demonstration of topics presented.  Learning Barriers/Preferences:     Learning Barriers/Preferences - 07/06/15 1026    Learning Barriers/Preferences   Learning Barriers None   Learning Preferences Written Material;Verbal Instruction;Skilled Demonstration;Audio      Education Topics: Risk Factor Reduction:  -Group instruction that is supported by a PowerPoint presentation. Instructor discusses the definition of a risk factor, different risk factors for pulmonary disease, and how the heart and lungs work together.     Nutrition for Pulmonary Patient:  -Group instruction provided by PowerPoint slides, verbal discussion, and written materials to support subject matter. The instructor gives an explanation and review of healthy diet recommendations, which includes a discussion on weight management, recommendations for fruit and vegetable consumption, as well as protein, fluid, caffeine, fiber, sodium, sugar, and alcohol. Tips for eating when patients are short of breath are discussed.      PULMONARY REHAB OTHER RESPIRATORY from 09/03/2015 in [place]   Date  09/03/15   Educator  RD   Instruction Review Code  2- meets goals/outcomes      Pursed Lip Breathing:  -Group instruction that is supported by  demonstration and informational handouts. Instructor discusses the benefits of pursed lip and diaphragmatic breathing and detailed demonstration on how to preform both.        PULMONARY REHAB OTHER RESPIRATORY from 09/03/2015 in [place]   Date  08/27/15   Educator  EP   Instruction Review Code  2- meets goals/outcomes      Oxygen Safety:  -Group instruction provided by PowerPoint, verbal discussion, and written material to support subject matter. There is an overview of "What is Oxygen" and "Why do we need it".  Instructor also reviews how to create a safe environment for oxygen use, the importance of using oxygen as prescribed, and the risks of noncompliance. There is a brief discussion on traveling with oxygen and resources the patient may utilize.   Oxygen Equipment:  -Group instruction provided by [facility] Staff utilizing handouts, written materials, and equipment demonstrations.      PULMONARY REHAB OTHER RESPIRATORY from 09/03/2015 in [place]   Date  07/23/15   Educator  Ace Gins Rep   Instruction Review Code  2- meets goals/outcomes      Signs and Symptoms:  -Group instruction provided by written material and verbal discussion to support subject matter. Warning signs and symptoms of infection, stroke, and heart attack are reviewed and when to call the physician/911 reinforced. Tips for preventing the spread of infection discussed.   Advanced Directives:  -Group instruction provided by verbal instruction and written material to support subject matter. Instructor reviews Advanced Directive laws and proper instruction for filling out document.      PULMONARY REHAB OTHER RESPIRATORY from 09/03/2015 in [place]   Date  08/20/15   Educator  Jeanella Craze   Instruction Review Code  2- meets goals/outcomes      Pulmonary Video:  -Group video education that reviews the importance of  medication and oxygen compliance, exercise, good nutrition, pulmonary hygiene, and pursed lip and diaphragmatic breathing for the pulmonary patient.   Exercise for the Pulmonary Patient:  -Group instruction that is supported by a PowerPoint presentation. Instructor discusses benefits of exercise, core components of exercise, frequency, duration, and intensity of an exercise routine, importance of utilizing pulse oximetry during exercise, safety while exercising, and options of places to exercise outside of rehab.     Pulmonary Medications:  -Verbally interactive group education provided by instructor with focus on inhaled medications and proper administration.      PULMONARY REHAB OTHER RESPIRATORY from 09/03/2015 in [place]   Date  07/16/15   Educator  RT   Instruction Review Code  2- meets goals/outcomes      Anatomy and Physiology of the Respiratory System and Intimacy:  -Group instruction provided by PowerPoint, verbal discussion, and written material to support subject matter. Instructor reviews respiratory cycle and anatomical components of the respiratory system and their functions. Instructor also reviews differences in obstructive and restrictive respiratory diseases with examples of each. Intimacy, Sex, and Sexuality differences are reviewed with a discussion on how relationships can change when diagnosed with pulmonary disease. Common sexual concerns are reviewed.          PULMONARY REHAB OTHER RESPIRATORY from 09/03/2015 in [place]   Date  08/13/15  Educator  RN   Instruction Review Code  2- meets goals/outcomes      Knowledge Questionnaire Score:   Core Components/Risk Factors/Patient Goals at Admission:     Personal Goals and Risk Factors at Admission - 07/06/15 1032    Core Components/Risk Factors/Patient Goals on Admission   Increase Strength and Stamina Yes   Intervention Provide advice, education,  support and counseling about physical activity/exercise needs.;Develop an individualized exercise prescription for aerobic and resistive training based on initial evaluation findings, risk stratification, comorbidities and participant's personal goals.   Expected Outcomes Achievement of increased cardiorespiratory fitness and enhanced flexibility, muscular endurance and strength shown through measurements of functional capacity and personal statement of participant.   Improve shortness of breath with ADL's Yes   Intervention Provide education, individualized exercise plan and daily activity instruction to help decrease symptoms of SOB with activities of daily living.   Expected Outcomes Short Term: Achieves a reduction of symptoms when performing activities of daily living.   Intervention Provide education, demonstration and support about specific breathing techniuqes utilized for more efficient breathing. Include techniques such as pursed lipped breathing, diaphragmatic breathing and self-pacing activity.   Expected Outcomes Short Term: Participant will be able to demonstrate and use breathing techniques as needed throughout daily activities.   Personal Goal Other Yes   Personal Goal To be able to have the energy to do more house work and gardening.     Intervention Increase workloads on equipment as tolerated to increase her strength and stamina   Expected Outcomes To have the energy to perform ADL's at home.      Core Components/Risk Factors/Patient Goals Review:      Goals and Risk Factor Review      07/06/15 1043 08/18/15 1639 09/10/15 1121       Core Components/Risk Factors/Patient Goals Review   Personal Goals Review Increase Strength and Stamina;Develop more efficient breathing techniques such as purse lipped breathing and diaphragmatic breathing and practicing self-pacing with activity.;Improve shortness of breath with ADL's       Review  --  she has attended 10 exercise sessions and  has noticed a definite increase in her strength and stamina,. Improvement in strength and stamina, still is not exercising at home, encouraged that.  Positive outlook with exercise.Purse lip breathing technique is excellent.     Expected Outcomes  further improvement in strength and stamina by increasing workloads, purse lip breathing is improving Increased strength and stamina as workloads are increased as tolerated.        Core Components/Risk Factors/Patient Goals at Discharge (Final Review):      Goals and Risk Factor Review - 09/10/15 1121    Core Components/Risk Factors/Patient Goals Review   Review Improvement in strength and stamina, still is not exercising at home, encouraged that.  Positive outlook with exercise.Purse lip breathing technique is excellent.   Expected Outcomes Increased strength and stamina as workloads are increased as tolerated.      ITP Comments:   Comments: ITP REVIEW Pt is making expected progress toward personal goals after completing 17 sessions.   Recommend continued exercise, life style modification, education, and utilization of breathing techniques to increase stamina and strength and decrease shortness of breath with exertion.

## 2015-09-17 ENCOUNTER — Encounter (HOSPITAL_COMMUNITY)
Admission: RE | Admit: 2015-09-17 | Discharge: 2015-09-17 | Disposition: A | Discharge status: Home / Self Care | Payer: Medicare Other | Source: Ambulatory Visit | Attending: Pulmonary Disease | Admitting: Pulmonary Disease

## 2015-09-17 VITALS — Wt 117.7 lb

## 2015-09-17 DIAGNOSIS — J439 Emphysema, unspecified: Secondary | ICD-10-CM

## 2015-09-17 NOTE — Progress Notes (Signed)
Daily Session Note  Patient Details  Name: Gina Fritz MRN: [id number] Date of Birth: 26-Nov-1940 Referring Provider:    Encounter Date: 09/17/2015  Check In:     Session Check In - 09/17/15 1346    Check-In   Location [facility]   Staff Present Su Hilt, MS, ACSM RCEP, Exercise Physiologist;Joan Leonia Reeves, RN, Luisa Hart, RN, BSN   Supervising physician immediately available to respond to emergencies [facility] immediately available   Physician(s) Dr. Waldron Labs   Medication changes reported     No   Fall or balance concerns reported    No   Warm-up and Cool-down Performed as group-led Location manager Performed Yes   VAD Patient? No   Pain Assessment   Currently in Pain? No/denies   Multiple Pain Sites No      Capillary Blood Glucose: No results found for this or any previous visit (from the past 24 hour(s)).      Exercise Prescription Changes - 09/17/15 1648    Response to Exercise   Blood Pressure (Admit) 156/78 mmHg   Blood Pressure (Exercise) 162/82 mmHg   Blood Pressure (Exit) 106/68 mmHg   Heart Rate (Admit) 63 bpm   Heart Rate (Exercise) 69 bpm   Heart Rate (Exit) 70 bpm   Oxygen Saturation (Admit) 96 %   Oxygen Saturation (Exercise) 91 %   Oxygen Saturation (Exit) 94 %   Rating of Perceived Exertion (Exercise) 15   Perceived Dyspnea (Exercise) 2   Duration Progress to 45 minutes of aerobic exercise without signs/symptoms of physical distress   Intensity THRR unchanged   Progression   Progression Continue to progress workloads to maintain intensity without signs/symptoms of physical distress.   Resistance Training   Training Prescription Yes   Weight orange bands   Reps 10-12   Interval Training   Interval Training No   Bike   Level 0.5  decreased workload due to leg fatigueand RPE of 15   Minutes 15   NuStep   Level 4   Minutes 15   METs 1     Goals Met:  Improved SOB with ADL's Using  PLB without cueing & demonstrates good technique Exercise tolerated well No report of cardiac concerns or symptoms Strength training completed today  Goals Unmet:  Not Applicable  Comments: Service time is from 1330 to 1550   Dr. Rush Farmer is Medical Director for Pulmonary Rehab at [facility].

## 2015-09-18 ENCOUNTER — Encounter (INDEPENDENT_AMBULATORY_CARE_PROVIDER_SITE_OTHER): Payer: Medicare Other | Admitting: Ophthalmology

## 2015-09-18 DIAGNOSIS — I10 Essential (primary) hypertension: Secondary | ICD-10-CM

## 2015-09-18 DIAGNOSIS — H353112 Nonexudative age-related macular degeneration, right eye, intermediate dry stage: Secondary | ICD-10-CM | POA: Diagnosis not present

## 2015-09-18 DIAGNOSIS — H35033 Hypertensive retinopathy, bilateral: Secondary | ICD-10-CM | POA: Diagnosis not present

## 2015-09-18 DIAGNOSIS — H43813 Vitreous degeneration, bilateral: Secondary | ICD-10-CM | POA: Diagnosis not present

## 2015-09-18 DIAGNOSIS — H353221 Exudative age-related macular degeneration, left eye, with active choroidal neovascularization: Secondary | ICD-10-CM | POA: Diagnosis not present

## 2015-09-22 ENCOUNTER — Encounter (HOSPITAL_COMMUNITY)
Admission: RE | Admit: 2015-09-22 | Discharge: 2015-09-22 | Disposition: A | Discharge status: Home / Self Care | Payer: Medicare Other | Source: Ambulatory Visit | Attending: Pulmonary Disease | Admitting: Pulmonary Disease

## 2015-09-22 VITALS — Wt 117.1 lb

## 2015-09-22 DIAGNOSIS — J439 Emphysema, unspecified: Secondary | ICD-10-CM | POA: Diagnosis not present

## 2015-09-22 NOTE — Progress Notes (Signed)
Daily Session Note  Patient Details  Name: Gina Fritz MRN: [id number] Date of Birth: 08-Jan-1941 Referring Provider:    Encounter Date: 09/22/2015  Check In:     Session Check In - 09/22/15 1348    Check-In   [facility]   Staff Present Su Hilt, MS, ACSM RCEP, Exercise Physiologist;Anniyah Mood Leonia Reeves, RN, Roque Cash, RN   Supervising physician immediately available to respond to emergencies [facility] immediately available   Physician(s) Dr. Marily Memos   Medication changes reported     No   Fall or balance concerns reported    No   Warm-up and Cool-down Performed as group-led instruction   Resistance Training Performed Yes   VAD Patient? No   Pain Assessment   Currently in Pain? No/denies   Multiple Pain Sites No      Capillary Blood Glucose: No results found for this or any previous visit (from the past 24 hour(s)).      Exercise Prescription Changes - 09/22/15 1600    Response to Exercise   Blood Pressure (Admit) 90/60 mmHg   Blood Pressure (Exercise) 104/60 mmHg   Blood Pressure (Exit) 96/60 mmHg   Heart Rate (Admit) 68 bpm   Heart Rate (Exercise) 85 bpm   Heart Rate (Exit) 68 bpm   Oxygen Saturation (Admit) 99 %   Oxygen Saturation (Exercise) 98 %   Oxygen Saturation (Exit) 97 %   Rating of Perceived Exertion (Exercise) 13   Perceived Dyspnea (Exercise) 1   Duration Progress to 45 minutes of aerobic exercise without signs/symptoms of physical distress   Intensity THRR unchanged   Progression   Progression Continue to progress workloads to maintain intensity without signs/symptoms of physical distress.   Resistance Training   Training Prescription Yes   Weight orange bands   Reps 10-12   Interval Training   Interval Training No   Bike   Level 0.5   Minutes 15   NuStep   Level 4   Minutes 15   METs 1.8   Track   Laps 9   Minutes 15     Goals Met:  Exercise tolerated well Strength training completed  today  Goals Unmet:  Not Applicable  Comments: Service time is from 1330 to 1505    Dr. Rush Farmer is Medical Director for Pulmonary Rehab at [facility].

## 2015-09-24 ENCOUNTER — Encounter (HOSPITAL_COMMUNITY)
Admission: RE | Admit: 2015-09-24 | Discharge: 2015-09-24 | Disposition: A | Discharge status: Home / Self Care | Payer: Medicare Other | Source: Ambulatory Visit | Attending: Pulmonary Disease | Admitting: Pulmonary Disease

## 2015-09-24 VITALS — Wt 119.0 lb

## 2015-09-24 DIAGNOSIS — J439 Emphysema, unspecified: Secondary | ICD-10-CM | POA: Diagnosis not present

## 2015-09-24 NOTE — Progress Notes (Signed)
Daily Session Note  Patient Details  Name: Gina Fritz MRN: [id number] Date of Birth: [birth year] Referring Provider:    Encounter Date: 09/24/2015  Check In:     Session Check In - 09/24/15 1613    Check-In   [facility]   Staff Present Rosebud Poles, RN, BSN;Molly diVincenzo, MS, ACSM RCEP, Exercise Physiologist   Supervising physician immediately available to respond to emergencies [facility] immediately available   Physician(s) Dr. Marily Memos   Medication changes reported     No   Fall or balance concerns reported    No   Warm-up and Cool-down Performed as group-led instruction   Resistance Training Performed Yes   VAD Patient? No   VAD patient   Has back up controller? No   Pain Assessment   Currently in Pain? No/denies   Multiple Pain Sites No      Capillary Blood Glucose: No results found for this or any previous visit (from the past 24 hour(s)).      Exercise Prescription Changes - 09/24/15 1600    Response to Exercise   Blood Pressure (Admit) 130/60 mmHg   Blood Pressure (Exercise) 124/70 mmHg   Blood Pressure (Exit) 96/60 mmHg   Heart Rate (Admit) 72 bpm   Heart Rate (Exercise) 82 bpm   Heart Rate (Exit) 71 bpm   Oxygen Saturation (Admit) 90 %   Oxygen Saturation (Exercise) 95 %   Oxygen Saturation (Exit) 97 %   Rating of Perceived Exertion (Exercise) 14   Perceived Dyspnea (Exercise) 2   Duration Progress to 45 minutes of aerobic exercise without signs/symptoms of physical distress   Intensity THRR unchanged   Progression   Progression Continue to progress workloads to maintain intensity without signs/symptoms of physical distress.   Resistance Training   Training Prescription Yes   Weight orange bands   Reps 10-12   Interval Training   Interval Training No   Bike   Level 0.5   Minutes 15   NuStep   Level 4   Minutes 15   METs 1.7   Track   Laps 9   Minutes 15     Goals Met:  Exercise tolerated well No  report of cardiac concerns or symptoms Strength training completed today  Goals Unmet:  Not Applicable  Comments: Service time is from 1330 to 1500    Dr. Rush Farmer is Medical Director for Pulmonary Rehab at [facility].

## 2015-09-29 ENCOUNTER — Encounter (HOSPITAL_COMMUNITY)
Admission: RE | Admit: 2015-09-29 | Discharge: 2015-09-29 | Disposition: A | Discharge status: Home / Self Care | Payer: Medicare Other | Source: Ambulatory Visit | Attending: Pulmonary Disease | Admitting: Pulmonary Disease

## 2015-09-29 VITALS — Wt 119.5 lb

## 2015-09-29 DIAGNOSIS — J439 Emphysema, unspecified: Secondary | ICD-10-CM | POA: Diagnosis not present

## 2015-09-29 NOTE — Progress Notes (Signed)
Daily Session Note  Patient Details  Name: Gina Fritz MRN: [id number] Date of Birth: 1940/05/28 Referring Provider:    Encounter Date: 09/29/2015  Check In:     Session Check In - 09/29/15 1339    Check-In   [facility]   Staff Present Trish Fountain, RN, Maxcine Ham, RN, BSN;Lisa Ysidro Evert, RN;Molly diVincenzo, MS, ACSM RCEP, Exercise Physiologist   Supervising physician immediately available to respond to emergencies [facility] immediately available   Physician(s) Dr. Marily Memos   Medication changes reported     No   Fall or balance concerns reported    No   Warm-up and Cool-down Performed as group-led instruction   Resistance Training Performed Yes   VAD Patient? No   VAD patient   Has back up controller? No   Pain Assessment   Currently in Pain? No/denies   Multiple Pain Sites No      Capillary Blood Glucose: No results found for this or any previous visit (from the past 24 hour(s)).      Exercise Prescription Changes - 09/29/15 1500    Exercise Review   Progression No   Response to Exercise   Blood Pressure (Admit) 120/60 mmHg   Blood Pressure (Exercise) 112/60 mmHg   Blood Pressure (Exit) 114/66 mmHg   Heart Rate (Admit) 64 bpm   Heart Rate (Exercise) 84 bpm   Heart Rate (Exit) 68 bpm   Oxygen Saturation (Admit) 99 %   Oxygen Saturation (Exercise) 92 %   Oxygen Saturation (Exit) 97 %   Rating of Perceived Exertion (Exercise) 15   Perceived Dyspnea (Exercise) 2   Duration Progress to 45 minutes of aerobic exercise without signs/symptoms of physical distress   Intensity THRR unchanged   Progression   Progression Continue to progress workloads to maintain intensity without signs/symptoms of physical distress.   Resistance Training   Training Prescription Yes   Weight orange bands   Reps 10-12   Interval Training   Interval Training No   Bike   Level 0.5   Minutes 17   NuStep   Level 4   Minutes 17   METs 2.1   Track   Laps 9   Minutes 17     Goals Met:  Independence with exercise equipment Improved SOB with ADL's Using PLB without cueing & demonstrates good technique Exercise tolerated well Strength training completed today  Goals Unmet:  Not Applicable  Comments: Service time is from 1330 to 1505     Dr. Rush Farmer is Medical Director for Pulmonary Rehab at [facility]

## 2015-10-01 ENCOUNTER — Encounter (HOSPITAL_COMMUNITY): Payer: Medicare Other

## 2015-10-06 ENCOUNTER — Encounter (HOSPITAL_COMMUNITY): Payer: Medicare Other

## 2015-10-08 ENCOUNTER — Encounter (HOSPITAL_COMMUNITY)
Admission: RE | Admit: 2015-10-08 | Discharge: 2015-10-08 | Disposition: A | Discharge status: Home / Self Care | Payer: Medicare Other | Source: Ambulatory Visit | Attending: Pulmonary Disease | Admitting: Pulmonary Disease

## 2015-10-08 VITALS — Wt 119.3 lb

## 2015-10-08 DIAGNOSIS — J439 Emphysema, unspecified: Secondary | ICD-10-CM

## 2015-10-08 NOTE — Progress Notes (Signed)
Daily Session Note  Patient Details  Name: Gina Fritz MRN: [id number] Date of Birth: 21-Mar-1941 Referring Provider:    Encounter Date: 10/08/2015  Check In:     Session Check In - 10/08/15 1403    Check-In   [facility]   Staff Present Rosebud Poles, RN, BSN;Lisa Ysidro Evert, Felipe Drone, RN, MHA;Molly diVincenzo, MS, ACSM RCEP, Exercise Physiologist   Supervising physician immediately available to respond to emergencies [facility] immediately available   Physician(s) Dr. Cruzita Lederer   Medication changes reported     No   Fall or balance concerns reported    No   Warm-up and Cool-down Performed as group-led instruction   Resistance Training Performed Yes   VAD Patient? No   Pain Assessment   Currently in Pain? No/denies   Multiple Pain Sites No      Capillary Blood Glucose: No results found for this or any previous visit (from the past 24 hour(s)).      Exercise Prescription Changes - 10/08/15 1600    Response to Exercise   Blood Pressure (Admit) 136/60 mmHg   Blood Pressure (Exercise) 92/60 mmHg   Blood Pressure (Exit) 104/72 mmHg   Heart Rate (Admit) 62 bpm   Heart Rate (Exercise) 97 bpm   Heart Rate (Exit) 65 bpm   Oxygen Saturation (Admit) 95 %   Oxygen Saturation (Exercise) 92 %   Oxygen Saturation (Exit) 98 %   Rating of Perceived Exertion (Exercise) 15   Perceived Dyspnea (Exercise) 2   Duration Progress to 45 minutes of aerobic exercise without signs/symptoms of physical distress   Intensity THRR unchanged   Progression   Progression Continue to progress workloads to maintain intensity without signs/symptoms of physical distress.   Resistance Training   Training Prescription Yes   Weight orange bands   Reps 10-12   Interval Training   Interval Training No   Bike   Level 0.5   Minutes 17   Track   Laps 9   Minutes 17     Goals Met:  Independence with exercise equipment Improved SOB with ADL's Using PLB  without cueing & demonstrates good technique Exercise tolerated well Strength training completed today  Goals Unmet:  Not Applicable  Comments: Service time is from 1330 to 1530   Dr. Rush Farmer is Medical Director for Pulmonary Rehab at [facility].

## 2015-10-13 ENCOUNTER — Telehealth: Payer: Self-pay | Admitting: Pulmonary Disease

## 2015-10-13 ENCOUNTER — Encounter (HOSPITAL_COMMUNITY)
Admission: RE | Admit: 2015-10-13 | Discharge: 2015-10-13 | Disposition: A | Discharge status: Home / Self Care | Payer: Medicare Other | Source: Ambulatory Visit | Attending: Pulmonary Disease | Admitting: Pulmonary Disease

## 2015-10-13 VITALS — Wt 118.6 lb

## 2015-10-13 DIAGNOSIS — J449 Chronic obstructive pulmonary disease, unspecified: Secondary | ICD-10-CM

## 2015-10-13 DIAGNOSIS — J439 Emphysema, unspecified: Secondary | ICD-10-CM

## 2015-10-13 NOTE — Telephone Encounter (Signed)
Called Gina Fritz and [email or web address] to discuss the order for pulmonary rehab.

## 2015-10-13 NOTE — Progress Notes (Signed)
Daily Session Note  Patient Details  Name: Gina Fritz MRN: [id number] Date of Birth: [birth year] Referring Provider:    Encounter Date: 10/13/2015  Check In:     Session Check In - 10/13/15 1340    Check-In   [facility]   Staff Present Rosebud Poles, RN, BSN;Lisa Ysidro Evert, RN;Portia Rollene Rotunda, RN, BSN;Ramon Dredge, RN, [facility]   Supervising physician immediately available to respond to emergencies [facility] immediately available   Physician(s) Dr Marily Memos   Medication changes reported     No   Fall or balance concerns reported    No   Warm-up and Cool-down Performed as group-led instruction   Resistance Training Performed Yes   VAD Patient? No   VAD patient   Has back up controller? No   Pain Assessment   Currently in Pain? No/denies   Multiple Pain Sites No      Capillary Blood Glucose: No results found for this or any previous visit (from the past 24 hour(s)).      Exercise Prescription Changes - 10/13/15 1600    Response to Exercise   Blood Pressure (Admit) 104/70 mmHg   Blood Pressure (Exercise) 120/70 mmHg   Blood Pressure (Exit) 100/60 mmHg   Heart Rate (Admit) 68 bpm   Heart Rate (Exercise) 87 bpm   Heart Rate (Exit) 69 bpm   Oxygen Saturation (Admit) 98 %   Oxygen Saturation (Exercise) 96 %   Oxygen Saturation (Exit) 98 %   Rating of Perceived Exertion (Exercise) 12   Perceived Dyspnea (Exercise) 1   Duration Progress to 45 minutes of aerobic exercise without signs/symptoms of physical distress   Intensity THRR unchanged   Progression   Progression Continue to progress workloads to maintain intensity without signs/symptoms of physical distress.   Resistance Training   Training Prescription Yes   Weight orange bands   Reps 10-12   Interval Training   Interval Training No   Bike   Level 0.5   Minutes 17   NuStep   Level 4   Minutes 17   Track   Laps 11   Minutes 17     Goals Met:  Independence with exercise  equipment Improved SOB with ADL's Using PLB without cueing & demonstrates good technique Exercise tolerated well Strength training completed today  Goals Unmet:  Not Applicable  Comments: Service time is from 1330 to 1515    Dr. Rush Farmer is Medical Director for Pulmonary Rehab at [facility].

## 2015-10-14 ENCOUNTER — Ambulatory Visit (HOSPITAL_COMMUNITY): Payer: Self-pay | Admitting: *Deleted

## 2015-10-14 NOTE — Telephone Encounter (Signed)
Gina Fritz for Lattie Haw with pulm rehab

## 2015-10-14 NOTE — Telephone Encounter (Signed)
Lattie Haw, pulm rehab, returned call [phone]

## 2015-10-14 NOTE — Telephone Encounter (Signed)
Lattie Haw w/ pulm rehab called back. She is requesting an order for pulm maintenance. Pt will graduate from under grad and is wanting to do maintenance. Please advise Dr. Halford Chessman thanks

## 2015-10-15 ENCOUNTER — Encounter (HOSPITAL_COMMUNITY)
Admission: RE | Admit: 2015-10-15 | Discharge: 2015-10-15 | Disposition: A | Discharge status: Home / Self Care | Payer: Medicare Other | Source: Ambulatory Visit | Attending: Pulmonary Disease | Admitting: Pulmonary Disease

## 2015-10-15 VITALS — Wt 118.6 lb

## 2015-10-15 DIAGNOSIS — J439 Emphysema, unspecified: Secondary | ICD-10-CM | POA: Diagnosis not present

## 2015-10-15 NOTE — Progress Notes (Addendum)
Daily Session Note  Patient Details  Name: Gina Fritz MRN: [id number] Date of Birth: [birth year] Referring Provider:    Encounter Date: 10/15/2015  Check In:     Session Check In - 10/15/15 1625    Check-In   [facility]   Staff Present Rosebud Poles, RN, BSN;Aubriee Szeto Ysidro Evert, RN;Portia Rollene Rotunda, RN, BSN   Supervising physician immediately available to respond to emergencies [facility] immediately available   Physician(s) Dr. Marthenia Rolling   Medication changes reported     No   Fall or balance concerns reported    No   Warm-up and Cool-down Performed as group-led instruction   Resistance Training Performed Yes   VAD Patient? No   VAD patient   Has back up controller? No   Pain Assessment   Currently in Pain? No/denies   Multiple Pain Sites No      Capillary Blood Glucose: No results found for this or any previous visit (from the past 24 hour(s)).      Exercise Prescription Changes - 10/15/15 1600    Response to Exercise   Blood Pressure (Admit) 108/68 mmHg   Blood Pressure (Exercise) 122/60 mmHg   Blood Pressure (Exit) 130/60 mmHg   Heart Rate (Admit) 62 bpm   Heart Rate (Exercise) 81 bpm   Heart Rate (Exit) 60 bpm   Oxygen Saturation (Admit) 97 %   Oxygen Saturation (Exercise) 95 %   Oxygen Saturation (Exit) 95 %   Rating of Perceived Exertion (Exercise) 13   Perceived Dyspnea (Exercise) 1   Duration Progress to 45 minutes of aerobic exercise without signs/symptoms of physical distress   Intensity THRR unchanged   Progression   Progression Continue to progress workloads to maintain intensity without signs/symptoms of physical distress.   Resistance Training   Training Prescription Yes   Weight orange bands   Reps 10-12   Interval Training   Interval Training No   NuStep   Level 4   Minutes 17   METs 1.8   Track   Laps 9   Minutes 17     Goals Met:  Exercise tolerated well No report of cardiac concerns or symptoms Strength  training completed today  Goals Unmet:  Not Applicable  Comments: Service time is from 1330 to 1530    Dr. Rush Farmer is Medical Director for Pulmonary Rehab at [facility].

## 2015-10-15 NOTE — Telephone Encounter (Signed)
Order placed. Nothing further needed. 

## 2015-10-15 NOTE — Progress Notes (Signed)
Pulmonary Individual Treatment Plan  Patient Details  Name: Gina Fritz MRN: [id number] Date of Birth: Feb 17, 1941 Referring Provider:    Initial Encounter Date:       Pulmonary Rehab Walk Test from 07/07/2015 in [place]   Date  07/09/15      Visit Diagnosis: No diagnosis found.  Patient's Home Medications on Admission:   Current outpatient prescriptions:  .  acetaminophen (TYLENOL) 500 MG tablet, Take 1,000 mg by mouth every 6 (six) hours as needed for moderate pain or headache., Disp: , Rfl:  .  albuterol (PROAIR HFA) 108 (90 Base) MCG/ACT inhaler, Inhale 2 puffs into the lungs every 6 (six) hours as needed for wheezing or shortness of breath., Disp: 1 Inhaler, Rfl: 2 .  aspirin EC 81 MG tablet, Take 81 mg by mouth daily., Disp: , Rfl:  .  atorvastatin (LIPITOR) 40 MG tablet, Take 40 mg by mouth daily., Disp: , Rfl:  .  BESIVANCE 0.6 % SUSP, Place 1 drop into the right eye 4 (four) times daily. The day before and the day of the dr visit, Disp: , Rfl:  .  Cholecalciferol (VITAMIN D) 2000 UNITS CAPS, Take 2,000 Units by mouth daily., Disp: , Rfl:  .  DONNATAL 16.2 MG tablet, 16.2 tablets daily., Disp: , Rfl:  .  escitalopram (LEXAPRO) 20 MG tablet, , Disp: , Rfl:  .  fenofibrate micronized (ANTARA) 130 MG capsule, Take 130 mg by mouth daily. Reported on 07/23/2015, Disp: , Rfl: 1 .  fluticasone (FLONASE) 50 MCG/ACT nasal spray, Place 2 sprays into the nose daily., Disp: , Rfl:  .  ibuprofen (ADVIL,MOTRIN) 600 MG tablet, Take 1 tablet (600 mg total) by mouth every 8 (eight) hours as needed., Disp: 15 tablet, Rfl: 0 .  loperamide (IMODIUM) 2 MG capsule, Take 2-4 mg by mouth 4 (four) times daily as needed for diarrhea or loose stools. For upset stomach, Disp: , Rfl:  .  mometasone (ASMANEX) 220 MCG/INH inhaler, Inhale 2 puffs into the lungs daily. Reported on 07/23/2015, Disp: , Rfl:  .  mometasone-formoterol (DULERA) 100-5 MCG/ACT AERO, Inhale 2 puffs into  the lungs 2 (two) times daily., Disp: 1 Inhaler, Rfl: 5 .  montelukast (SINGULAIR) 10 MG tablet, Take 10 mg by mouth daily. , Disp: , Rfl:  .  Multiple Vitamin (MULTIVITAMIN WITH MINERALS) TABS, Take 1 tablet by mouth daily., Disp: , Rfl:  .  Multiple Vitamins-Minerals (ICAPS AREDS 2 PO), Take 1 tablet by mouth 2 (two) times daily., Disp: , Rfl:  .  ranitidine (ZANTAC) 300 MG tablet, Take 300 mg by mouth 2 (two) times daily., Disp: , Rfl:  .  solifenacin (VESICARE) 10 MG tablet, Take 10 mg by mouth daily. , Disp: , Rfl:  .  tamsulosin (FLOMAX) 0.4 MG CAPS capsule, Take 1 capsule (0.4 mg total) by mouth daily after breakfast. (Patient not taking: Reported on 07/23/2015), Disp: 30 capsule, Rfl: 2  Past Medical History: Past Medical History  Diagnosis Date  . Hypertension   . Depression   . Allergic rhinitis   . Rash   . Irritable bowel syndrome   . Sensorineural hearing loss, unilateral   . Subjective tinnitus   . Loss of weight   . Dizziness   . COPD with emphysema ([place])   . Anxiety   . Right ureteral stone   . History of kidney stones   . Colitis     dx 02-05-2015  . CAP (community acquired pneumonia)  dx 02-05-2015  . Pulmonary nodules     benign and stable per CT  . GERD (gastroesophageal reflux disease)   . Hyperlipidemia   . History of chronic sinusitis     Tobacco Use: History  Smoking status  . Former Smoker -- 1.00 packs/day for 25 years  . Types: Cigarettes  . Quit date: 02/05/2015  Smokeless tobacco  . Never Used    Labs: Recent Review Flowsheet Data    There is no flowsheet data to display.      Capillary Blood Glucose: Lab Results  Component Value Date   GLUCAP 112* 02/09/2015     ADL UCSD:   Pulmonary Function Assessment:     Pulmonary Function Assessment - 07/06/15 1029    Breath   Bilateral Breath Sounds Clear   Shortness of Breath Yes;Limiting activity      Exercise Target Goals:    Exercise Program Goal: Individual exercise  prescription set with THRR, safety & activity barriers. Participant demonstrates ability to understand and report RPE using BORG scale, to self-measure pulse accurately, and to acknowledge the importance of the exercise prescription.  Exercise Prescription Goal: Starting with aerobic activity 30 plus minutes a day, 3 days per week for initial exercise prescription. Provide home exercise prescription and guidelines that participant acknowledges understanding prior to discharge.  Activity Barriers & Risk Stratification:     Activity Barriers & Cardiac Risk Stratification - 07/06/15 1026    Activity Barriers & Cardiac Risk Stratification   Activity Barriers Deconditioning;Muscular Weakness;Shortness of Breath      6 Minute Walk:     6 Minute Walk      07/09/15 0758       6 Minute Walk   Phase Initial     Distance 678 feet     Walk Time 6 minutes     # of Rest Breaks 0     MPH 1.28     METS 1.73     RPE 7     Perceived Dyspnea  0     VO2 Peak 6.08     Symptoms No     Resting HR 80 bpm     Resting BP 138/78 mmHg     Max Ex. HR 96 bpm     Max Ex. BP 120/80 mmHg     2 Minute Post BP 120/80 mmHg     Interval HR   Baseline HR 80     1 Minute HR 83     2 Minute HR 81     3 Minute HR 82     4 Minute HR 96     5 Minute HR 90     6 Minute HR 91     2 Minute Post HR 65     Interval Heart Rate? Yes     Interval Oxygen   Interval Oxygen? Yes     Baseline Oxygen Saturation % 98 %     Baseline Liters of Oxygen 0 L     1 Minute Oxygen Saturation % 97 %     1 Minute Liters of Oxygen 0 L     2 Minute Oxygen Saturation % 97 %     2 Minute Liters of Oxygen 0 L     3 Minute Oxygen Saturation % 96 %     3 Minute Liters of Oxygen 0 L     4 Minute Oxygen Saturation % 96 %     4 Minute Liters of Oxygen 0 L  5 Minute Oxygen Saturation % 94 %     5 Minute Liters of Oxygen 0 L     6 Minute Oxygen Saturation % 96 %     6 Minute Liters of Oxygen 0 L     2 Minute Post Oxygen  Saturation % 98 %     2 Minute Post Liters of Oxygen 0 L        Initial Exercise Prescription:     Initial Exercise Prescription - 07/09/15 0800    Date of Initial Exercise RX and Referring Provider   Date 07/09/15   Oxygen   Oxygen --  room air   Bike   Level 0.2   Minutes 15   NuStep   Level 1   Minutes 15   METs 1.5   Track   Laps 5   Minutes 15   Prescription Details   Frequency (times per week) 2   Duration Progress to 45 minutes of aerobic exercise without signs/symptoms of physical distress   Intensity   THRR 40-80% of Max Heartrate 58-117   Ratings of Perceived Exertion 11-13   Perceived Dyspnea 0-4   Progression   Progression Continue to progress workloads to maintain intensity without signs/symptoms of physical distress.   Resistance Training   Training Prescription Yes   Weight orange bands   Reps 10-12      Perform Capillary Blood Glucose checks as needed.  Exercise Prescription Changes:     Exercise Prescription Changes      07/14/15 1500 07/16/15 1600 07/21/15 1500 07/23/15 1600 07/28/15 1400   Exercise Review   Progression No Yes Yes  Yes   Response to Exercise   Blood Pressure (Admit) 110/62 mmHg 120/70 mmHg 148/80 mmHg 100/70 mmHg 108/60 mmHg   Blood Pressure (Exercise) 124/78 mmHg 138/94 mmHg 132/70 mmHg 100/62 mmHg 98/60 mmHg   Blood Pressure (Exit) 96/60 mmHg 96/60 mmHg 132/68 mmHg 134/74 mmHg 110/60 mmHg   Heart Rate (Admit) 75 bpm 69 bpm 60 bpm 64 bpm 62 bpm   Heart Rate (Exercise) 79 bpm 82 bpm 70 bpm 66 bpm 107 bpm   Heart Rate (Exit) 67 bpm 60 bpm 56 bpm 62 bpm 67 bpm   Oxygen Saturation (Admit) 97 % 99 % 99 % 98 % 95 %   Oxygen Saturation (Exercise) 95 % 97 % 98 % 98 % 91 %   Oxygen Saturation (Exit) 99 % 100 % 98 % 97 % 97 %   Rating of Perceived Exertion (Exercise) 13 13 13 13 13    Perceived Dyspnea (Exercise) 1 0 1 0 1   Comments     --  Reviewed patients Home Exercise Prescription   Duration Progress to 45 minutes of aerobic  exercise without signs/symptoms of physical distress Progress to 45 minutes of aerobic exercise without signs/symptoms of physical distress Progress to 45 minutes of aerobic exercise without signs/symptoms of physical distress Progress to 45 minutes of aerobic exercise without signs/symptoms of physical distress Progress to 45 minutes of aerobic exercise without signs/symptoms of physical distress   Intensity THRR unchanged THRR unchanged THRR unchanged THRR unchanged THRR unchanged   Progression   Progression Continue to progress workloads to maintain intensity without signs/symptoms of physical distress. Continue to progress workloads to maintain intensity without signs/symptoms of physical distress. Continue to progress workloads to maintain intensity without signs/symptoms of physical distress. Continue to progress workloads to maintain intensity without signs/symptoms of physical distress. Continue to progress workloads to maintain intensity without signs/symptoms of physical  distress.   Resistance Training   Training Prescription Yes Yes Yes Yes Yes   Weight orange bands ornage bands ornage bands orange bands orange bands   Reps 10-12 10-12 10-12 10-12 10-12   Bike   Level 0.2 0.3 0.5  0.5   Minutes 15 15 15  15    NuStep   Level 1 1 2 2 3    Minutes 15 15 15 15 15    METs 1 1.6 1.6 1.4 1.2   Track   Laps 8  10  9    Minutes 15  15  15    Home Exercise Plan   Plans to continue exercise at     Home   Frequency     Add 3 additional days to program exercise sessions.     07/30/15 1500 08/04/15 1500 08/06/15 1500 08/13/15 1600 08/18/15 1540   Exercise Review   Progression Yes  Yes     Response to Exercise   Blood Pressure (Admit) 112/70 mmHg 122/70 mmHg 140/80 mmHg 140/80 mmHg 120/70 mmHg   Blood Pressure (Exercise) 100/60 mmHg 120/70 mmHg 118/74 mmHg 138/82 mmHg 120/64 mmHg   Blood Pressure (Exit) 106/60 mmHg 116/64 mmHg 116/60 mmHg 118/70 mmHg 100/60 mmHg   Heart Rate (Admit) 61 bpm 62 bpm  62 bpm 59 bpm 63 bpm   Heart Rate (Exercise) 95 bpm 74 bpm 77 bpm 71 bpm 84 bpm   Heart Rate (Exit) 68 bpm 70 bpm 64 bpm 57 bpm 67 bpm   Oxygen Saturation (Admit) 96 % 99 % 96 % 98 % 97 %   Oxygen Saturation (Exercise) 94 % 95 % 94 % 96 % 96 %   Oxygen Saturation (Exit) 98 % 94 % 98 % 98 % 98 %   Rating of Perceived Exertion (Exercise) 11 15 13 13 17    Perceived Dyspnea (Exercise) 1 2 1 1 2    Duration Progress to 45 minutes of aerobic exercise without signs/symptoms of physical distress Progress to 45 minutes of aerobic exercise without signs/symptoms of physical distress Progress to 45 minutes of aerobic exercise without signs/symptoms of physical distress Progress to 45 minutes of aerobic exercise without signs/symptoms of physical distress Progress to 45 minutes of aerobic exercise without signs/symptoms of physical distress   Intensity THRR unchanged THRR unchanged THRR unchanged THRR unchanged THRR unchanged   Progression   Progression Continue to progress workloads to maintain intensity without signs/symptoms of physical distress. Continue to progress workloads to maintain intensity without signs/symptoms of physical distress. Continue progressive overload as per policy without signs/symptoms or physical distress. Continue progressive overload as per policy without signs/symptoms or physical distress. Continue progressive overload as per policy without signs/symptoms or physical distress.   Resistance Training   Training Prescription Yes Yes Yes Yes Yes   Weight orange bands orange bands' orange bands orange bands orange bands   Reps 10-12 10-12 10-12 10-12 10-12   Interval Training   Interval Training No No No No No   Bike   Level 0.7 0.7  0.7 0.7   Minutes 15 15   15    NuStep   Level 3 3 4   chest pain relieved with rest didn't return 4 4   Minutes 15 15 15 15 15    METs 1.5 1.8 1.2 2 1.9   Track   Laps 13 14 8  13    Minutes 15 15 15  15      08/20/15 1600 08/27/15 1500 09/01/15  1527 09/03/15 1600 09/08/15 1500   Response to Exercise  Blood Pressure (Admit) 130/60 mmHg 92/68 mmHg 100/72 mmHg 98/68 mmHg 108/70 mmHg   Blood Pressure (Exercise) 124/72 mmHg 148/78 mmHg 140/78 mmHg 124/84 mmHg 122/70 mmHg   Blood Pressure (Exit) 100/60 mmHg 124/72 mmHg 104/60 mmHg 100/60 mmHg 122/64 mmHg   Heart Rate (Admit) 65 bpm 61 bpm 67 bpm 64 bpm 62 bpm   Heart Rate (Exercise) 80 bpm 122 bpm 83 bpm 72 bpm 84 bpm   Heart Rate (Exit) 70 bpm 63 bpm 70 bpm 61 bpm 77 bpm   Oxygen Saturation (Admit) 98 % 98 % 97 % 97 % 98 %   Oxygen Saturation (Exercise) 94 % 96 % 95 % 93 % 92 %   Oxygen Saturation (Exit) 98 % 98 % 97 % 97 % 97 %   Rating of Perceived Exertion (Exercise) 13 14 15 15 15    Perceived Dyspnea (Exercise) 2 1 2 3 2    Duration Progress to 45 minutes of aerobic exercise without signs/symptoms of physical distress Progress to 45 minutes of aerobic exercise without signs/symptoms of physical distress Progress to 45 minutes of aerobic exercise without signs/symptoms of physical distress Progress to 45 minutes of aerobic exercise without signs/symptoms of physical distress Progress to 45 minutes of aerobic exercise without signs/symptoms of physical distress   Intensity THRR unchanged THRR unchanged THRR unchanged THRR unchanged THRR unchanged   Progression   Progression Continue progressive overload as per policy without signs/symptoms or physical distress. Continue to progress workloads to maintain intensity without signs/symptoms of physical distress. Continue to progress workloads to maintain intensity without signs/symptoms of physical distress. Continue to progress workloads to maintain intensity without signs/symptoms of physical distress. Continue to progress workloads to maintain intensity without signs/symptoms of physical distress.   Resistance Training   Training Prescription Yes Yes Yes Yes Yes   Weight orange bands orange bands orange bands orange bands orange bands   Reps  10-12 10-12 10-12 10-12 10-12   Interval Training   Interval Training No No No No No   Bike   Level  0.7 0.7  0.7   Minutes  15 15  15    NuStep   Level 4  4 4 4    Minutes 15   15 15    METs 2  1.9 2 2.1   Track   Laps 10 3 10 16 10    Minutes 15 15 15 15 15      09/10/15 1522 09/15/15 1500 09/17/15 1648 09/22/15 1600 09/24/15 1600   Response to Exercise   Blood Pressure (Admit) 130/70 mmHg 120/70 mmHg 156/78 mmHg 90/60 mmHg 130/60 mmHg   Blood Pressure (Exercise) 130/66 mmHg 102/60 mmHg 162/82 mmHg 104/60 mmHg 124/70 mmHg   Blood Pressure (Exit) 96/66 mmHg  asymptomatic, H20 encouraged 110/60 mmHg 106/68 mmHg 96/60 mmHg 96/60 mmHg   Heart Rate (Admit) 68 bpm 66 bpm 63 bpm 68 bpm 72 bpm   Heart Rate (Exercise) 91 bpm 88 bpm 69 bpm 85 bpm 82 bpm   Heart Rate (Exit) 92 bpm 70 bpm 70 bpm 68 bpm 71 bpm   Oxygen Saturation (Admit) 88 % 98 % 96 % 99 % 90 %   Oxygen Saturation (Exercise) 96 % 94 % 91 % 98 % 95 %   Oxygen Saturation (Exit) 92 % 97 % 94 % 97 % 97 %   Rating of Perceived Exertion (Exercise) 17 13 15 13 14    Perceived Dyspnea (Exercise) 1 2 2 1 2    Duration Progress to 45 minutes of aerobic exercise without  signs/symptoms of physical distress Progress to 45 minutes of aerobic exercise without signs/symptoms of physical distress Progress to 45 minutes of aerobic exercise without signs/symptoms of physical distress Progress to 45 minutes of aerobic exercise without signs/symptoms of physical distress Progress to 45 minutes of aerobic exercise without signs/symptoms of physical distress   Intensity THRR unchanged THRR unchanged THRR unchanged THRR unchanged THRR unchanged   Progression   Progression Continue to progress workloads to maintain intensity without signs/symptoms of physical distress. Continue to progress workloads to maintain intensity without signs/symptoms of physical distress. Continue to progress workloads to maintain intensity without signs/symptoms of physical distress.  Continue to progress workloads to maintain intensity without signs/symptoms of physical distress. Continue to progress workloads to maintain intensity without signs/symptoms of physical distress.   Resistance Training   Training Prescription Yes Yes Yes Yes Yes   Weight orange bands orange bands orange bands orange bands orange bands   Reps 10-12 10-12 10-12 10-12 10-12   Interval Training   Interval Training No No No No No   Bike   Level 0.7 0.5  decreased workload due to leg fatigueand RPE of 15 0.5  decreased workload due to leg fatigueand RPE of 15 0.5 0.5   Minutes 15 15 15 15 15    NuStep   Level  4 4 4 4    Minutes  15 15 15 15    METs  2 1 1.8 1.7   Track   Laps 10 7  9 9    Minutes 15 15  15 15      09/29/15 1500 10/08/15 1600 10/13/15 1600       Exercise Review   Progression No       Response to Exercise   Blood Pressure (Admit) 120/60 mmHg 136/60 mmHg 104/70 mmHg     Blood Pressure (Exercise) 112/60 mmHg 92/60 mmHg 120/70 mmHg     Blood Pressure (Exit) 114/66 mmHg 104/72 mmHg 100/60 mmHg     Heart Rate (Admit) 64 bpm 62 bpm 68 bpm     Heart Rate (Exercise) 84 bpm 97 bpm 87 bpm     Heart Rate (Exit) 68 bpm 65 bpm 69 bpm     Oxygen Saturation (Admit) 99 % 95 % 98 %     Oxygen Saturation (Exercise) 92 % 92 % 96 %     Oxygen Saturation (Exit) 97 % 98 % 98 %     Rating of Perceived Exertion (Exercise) 15 15 12      Perceived Dyspnea (Exercise) 2 2 1      Duration Progress to 45 minutes of aerobic exercise without signs/symptoms of physical distress Progress to 45 minutes of aerobic exercise without signs/symptoms of physical distress Progress to 45 minutes of aerobic exercise without signs/symptoms of physical distress     Intensity THRR unchanged THRR unchanged THRR unchanged     Progression   Progression Continue to progress workloads to maintain intensity without signs/symptoms of physical distress. Continue to progress workloads to maintain intensity without signs/symptoms  of physical distress. Continue to progress workloads to maintain intensity without signs/symptoms of physical distress.     Resistance Training   Training Prescription Yes Yes Yes     Weight orange bands orange bands orange bands     Reps 10-12 10-12 10-12     Interval Training   Interval Training No No No     Bike   Level 0.5 0.5 0.5     Minutes 17 17 17      NuStep  Level 4  4     Minutes 17  17     METs 2.1       Track   Laps 9 9 11      Minutes 17 17 17         Exercise Comments:     Exercise Comments      07/23/15 0910 08/20/15 0920 09/17/15 0855 09/29/15 1638 10/15/15 0907   Exercise Comments Patient is starting off well with exercise. Will continue to monitor exericise progression.  Patient is cont. to progress in exercise intensity. Patient is averaging about 14 laps in 15 minutes. Will continue to monitor and progress as appropriate. Patient is cont. to progress exercise intensity. Will cont. to monitor. Patient will cont. to exercise in Pulmonary Maintenance upon graduation. Has 1 more exercise session left before graduation, has progressed well, will attend pulmonary maintenance program after graduation.        Discharge Exercise Prescription (Final Exercise Prescription Changes):     Exercise Prescription Changes - 10/13/15 1600    Response to Exercise   Blood Pressure (Admit) 104/70 mmHg   Blood Pressure (Exercise) 120/70 mmHg   Blood Pressure (Exit) 100/60 mmHg   Heart Rate (Admit) 68 bpm   Heart Rate (Exercise) 87 bpm   Heart Rate (Exit) 69 bpm   Oxygen Saturation (Admit) 98 %   Oxygen Saturation (Exercise) 96 %   Oxygen Saturation (Exit) 98 %   Rating of Perceived Exertion (Exercise) 12   Perceived Dyspnea (Exercise) 1   Duration Progress to 45 minutes of aerobic exercise without signs/symptoms of physical distress   Intensity THRR unchanged   Progression   Progression Continue to progress workloads to maintain intensity without signs/symptoms of physical  distress.   Resistance Training   Training Prescription Yes   Weight orange bands   Reps 10-12   Interval Training   Interval Training No   Bike   Level 0.5   Minutes 17   NuStep   Level 4   Minutes 17   Track   Laps 11   Minutes 17       Nutrition:  Target Goals: Understanding of nutrition guidelines, daily intake of sodium 1500mg , cholesterol 200mg , calories 30% from fat and 7% or less from saturated fats, daily to have 5 or more servings of fruits and vegetables.  Biometrics:     Pre Biometrics - 07/06/15 1032    Pre Biometrics   Grip Strength 20 kg       Nutrition Therapy Plan and Nutrition Goals:     Nutrition Therapy & Goals - 07/23/15 1526    Nutrition Therapy   Diet High Calorie, High Protein   Personal Nutrition Goals   Personal Goal #1 Wt gain of 0.5-2 lb/week to a goal wt of 115 lb at graduation from [facility], educate and counsel regarding individualized specific dietary modifications aiming towards targeted core components such as weight, hypertension, lipid management, diabetes, heart failure and other comorbidities.;Nutrition handout(s) given to patient.  Handouts given for: High Calorie, High Protein diet ; Suggestions for Increasing Calories and Protein; and High Calorie, High Protein recipes   Expected Outcomes Short Term Goal: Understand basic principles of dietary content, such as calories, fat, sodium, cholesterol and nutrients.;Long Term Goal: Adherence to prescribed nutrition plan.      Nutrition Discharge: Rate Your Plate Scores:     Nutrition Assessments - 07/23/15 1517    Rate Your Plate Scores  Pre Score 41      Psychosocial: Target Goals: Acknowledge presence or absence of depression, maximize coping skills, provide positive support system. Participant is able to verbalize types and ability to use techniques and skills needed for reducing stress and depression.  Initial Review  & Psychosocial Screening:     Initial Psych Review & Screening - 07/06/15 1044    Initial Review   Current issues with History of Depression   Family Dynamics   Good Support System? Yes   Barriers   Psychosocial barriers to participate in program There are no identifiable barriers or psychosocial needs.   Screening Interventions   Interventions Encouraged to exercise      Quality of Life Scores:   PHQ-9:     Recent Review Flowsheet Data    Depression screen [facility] 2/9 07/06/2015   Decreased Interest 0   Down, Depressed, Hopeless 1   PHQ - 2 Score 1      Psychosocial Evaluation and Intervention:     Psychosocial Evaluation - 07/06/15 1044    Psychosocial Evaluation & Interventions   Interventions Relaxation education;Encouraged to exercise with the program and follow exercise prescription   Comments Continue Lexapro, begin exercising in our program   Continued Psychosocial Services Needed No      Psychosocial Re-Evaluation:     Psychosocial Re-Evaluation      08/18/15 1641 09/10/15 1124 10/12/15 1254       Psychosocial Re-Evaluation   Interventions Encouraged to attend Pulmonary Rehabilitation for the exercise Encouraged to attend Pulmonary Rehabilitation for the exercise Encouraged to attend Pulmonary Rehabilitation for the exercise     Comments --  no psychosocial issues at this time No psychosocial concerns are identified at this time. No psychosocial concerns identified at this time.     Continued Psychosocial Services Needed No No No       Education: Education Goals: Education classes will be provided on a weekly basis, covering required topics. Participant will state understanding/return demonstration of topics presented.  Learning Barriers/Preferences:     Learning Barriers/Preferences - 07/06/15 1026    Learning Barriers/Preferences   Learning Barriers None   Learning Preferences Written Material;Verbal Instruction;Skilled Demonstration;Audio       Education Topics: Risk Factor Reduction:  -Group instruction that is supported by a PowerPoint presentation. Instructor discusses the definition of a risk factor, different risk factors for pulmonary disease, and how the heart and lungs work together.     Nutrition for Pulmonary Patient:  -Group instruction provided by PowerPoint slides, verbal discussion, and written materials to support subject matter. The instructor gives an explanation and review of healthy diet recommendations, which includes a discussion on weight management, recommendations for fruit and vegetable consumption, as well as protein, fluid, caffeine, fiber, sodium, sugar, and alcohol. Tips for eating when patients are short of breath are discussed.          PULMONARY REHAB OTHER RESPIRATORY from 10/08/2015 in [place]   Date  09/03/15   Educator  RD   Instruction Review Code  2- meets goals/outcomes      Pursed Lip Breathing:  -Group instruction that is supported by demonstration and informational handouts. Instructor discusses the benefits of pursed lip and diaphragmatic breathing and detailed demonstration on how to preform both.        PULMONARY REHAB OTHER RESPIRATORY from 10/08/2015 in [place]   Date  08/27/15   Educator  EP   Instruction Review Code  2- meets goals/outcomes      Oxygen Safety:  -Group instruction provided by PowerPoint, verbal discussion, and written material to support subject matter. There is an overview of "What is Oxygen" and "Why do we need it".  Instructor also reviews how to create a safe environment for oxygen use, the importance of using oxygen as prescribed, and the risks of noncompliance. There is a brief discussion on traveling with oxygen and resources the patient may utilize.      PULMONARY REHAB OTHER RESPIRATORY from 10/08/2015 in [place]   Date  09/17/15   Educator  RN    Instruction Review Code  2- meets goals/outcomes      Oxygen Equipment:  -Group instruction provided by [facility] Staff utilizing handouts, written materials, and equipment demonstrations.      PULMONARY REHAB OTHER RESPIRATORY from 10/08/2015 in [place]   Date  07/23/15   Educator  Ace Gins Rep   Instruction Review Code  2- meets goals/outcomes      Signs and Symptoms:  -Group instruction provided by written material and verbal discussion to support subject matter. Warning signs and symptoms of infection, stroke, and heart attack are reviewed and when to call the physician/911 reinforced. Tips for preventing the spread of infection discussed.   Advanced Directives:  -Group instruction provided by verbal instruction and written material to support subject matter. Instructor reviews Advanced Directive laws and proper instruction for filling out document.      PULMONARY REHAB OTHER RESPIRATORY from 10/08/2015 in [place]   Date  08/20/15   Educator  Jeanella Craze   Instruction Review Code  2- meets goals/outcomes      Pulmonary Video:  -Group video education that reviews the importance of medication and oxygen compliance, exercise, good nutrition, pulmonary hygiene, and pursed lip and diaphragmatic breathing for the pulmonary patient.      PULMONARY REHAB OTHER RESPIRATORY from 10/08/2015 in [place]   Date  09/10/15   Instruction Review Code  2- meets goals/outcomes      Exercise for the Pulmonary Patient:  -Group instruction that is supported by a PowerPoint presentation. Instructor discusses benefits of exercise, core components of exercise, frequency, duration, and intensity of an exercise routine, importance of utilizing pulse oximetry during exercise, safety while exercising, and options of places to exercise outside of rehab.        PULMONARY REHAB OTHER RESPIRATORY from 10/08/2015  in [place]   Date  10/08/15   Educator  EP   Instruction Review Code  2- meets goals/outcomes      Pulmonary Medications:  -Verbally interactive group education provided by instructor with focus on inhaled medications and proper administration.      PULMONARY REHAB OTHER RESPIRATORY from 10/08/2015 in [place]   Date  07/16/15   Educator  RT   Instruction Review Code  2- meets goals/outcomes      Anatomy and Physiology of the Respiratory System and Intimacy:  -Group instruction provided by PowerPoint, verbal discussion, and written material to support subject matter. Instructor reviews respiratory cycle and anatomical components of the respiratory system and their functions. Instructor also reviews differences in obstructive and restrictive respiratory diseases with examples of each. Intimacy, Sex, and Sexuality differences are reviewed with a discussion on how relationships can change when diagnosed with pulmonary disease. Common sexual concerns are reviewed.  PULMONARY REHAB OTHER RESPIRATORY from 10/08/2015 in [place]   Date  08/13/15   Educator  RN   Instruction Review Code  2- meets goals/outcomes      Knowledge Questionnaire Score:   Core Components/Risk Factors/Patient Goals at Admission:     Personal Goals and Risk Factors at Admission - 07/06/15 1032    Core Components/Risk Factors/Patient Goals on Admission   Increase Strength and Stamina Yes   Intervention Provide advice, education, support and counseling about physical activity/exercise needs.;Develop an individualized exercise prescription for aerobic and resistive training based on initial evaluation findings, risk stratification, comorbidities and participant's personal goals.   Expected Outcomes Achievement of increased cardiorespiratory fitness and enhanced flexibility, muscular endurance and strength shown  through measurements of functional capacity and personal statement of participant.   Improve shortness of breath with ADL's Yes   Intervention Provide education, individualized exercise plan and daily activity instruction to help decrease symptoms of SOB with activities of daily living.   Expected Outcomes Short Term: Achieves a reduction of symptoms when performing activities of daily living.   Intervention Provide education, demonstration and support about specific breathing techniuqes utilized for more efficient breathing. Include techniques such as pursed lipped breathing, diaphragmatic breathing and self-pacing activity.   Expected Outcomes Short Term: Participant will be able to demonstrate and use breathing techniques as needed throughout daily activities.   Personal Goal Other Yes   Personal Goal To be able to have the energy to do more house work and gardening.     Intervention Increase workloads on equipment as tolerated to increase her strength and stamina   Expected Outcomes To have the energy to perform ADL's at home.      Core Components/Risk Factors/Patient Goals Review:      Goals and Risk Factor Review      07/06/15 1043 08/18/15 1639 09/10/15 1121 10/12/15 1250     Core Components/Risk Factors/Patient Goals Review   Personal Goals Review Increase Strength and Stamina;Develop more efficient breathing techniques such as purse lipped breathing and diaphragmatic breathing and practicing self-pacing with activity.;Improve shortness of breath with ADL's   Increase Strength and Stamina;Improve shortness of breath with ADL's;Develop more efficient breathing techniques such as purse lipped breathing and diaphragmatic breathing and practicing self-pacing with activity.    Review  --  she has attended 10 exercise sessions and has noticed a definite increase in her strength and stamina,. Improvement in strength and stamina, still is not exercising at home, encouraged that.  Positive  outlook with exercise.Purse lip breathing technique is excellent. Will graduate in 2 more exercise sessions.  Wants to participate in pulmonary maintenance after graduation.  Maxed out @ .5 on airdyne bike, this is particularly hard for [place].  9 laps on track, and level 4 on nustep.      Expected Outcomes  further improvement in strength and stamina by increasing workloads, purse lip breathing is improving Increased strength and stamina as workloads are increased as tolerated. Assist in maintaing strength and stamina in maintenance pulmonary rehab.         Core Components/Risk Factors/Patient Goals at Discharge (Final Review):      Goals and Risk Factor Review - 10/12/15 1250    Core Components/Risk Factors/Patient Goals Review   Personal Goals Review Increase Strength and Stamina;Improve shortness of breath with ADL's;Develop more efficient breathing techniques such as purse lipped breathing and diaphragmatic breathing and practicing self-pacing with activity.   Review Will graduate in 2 more  exercise sessions.  Wants to participate in pulmonary maintenance after graduation.  Maxed out @ .5 on airdyne bike, this is particularly hard for [place].  9 laps on track, and level 4 on nustep.     Expected Outcomes Assist in maintaing strength and stamina in maintenance pulmonary rehab.        ITP Comments:   Comments: ITP REVIEW Pt is making expected progress toward personal goals after completing 23 sessions.   Recommend continued exercise, life style modification, education, and utilization of breathing techniques to increase stamina and strength and decrease shortness of breath with exertion.

## 2015-10-15 NOTE — Telephone Encounter (Signed)
Okay to send order for pulmonary rehab maintenance classes.

## 2015-10-16 ENCOUNTER — Encounter (INDEPENDENT_AMBULATORY_CARE_PROVIDER_SITE_OTHER): Payer: Medicare Other | Admitting: Ophthalmology

## 2015-10-16 DIAGNOSIS — H43813 Vitreous degeneration, bilateral: Secondary | ICD-10-CM

## 2015-10-16 DIAGNOSIS — I10 Essential (primary) hypertension: Secondary | ICD-10-CM | POA: Diagnosis not present

## 2015-10-16 DIAGNOSIS — H353122 Nonexudative age-related macular degeneration, left eye, intermediate dry stage: Secondary | ICD-10-CM

## 2015-10-16 DIAGNOSIS — H35033 Hypertensive retinopathy, bilateral: Secondary | ICD-10-CM

## 2015-10-16 DIAGNOSIS — H353211 Exudative age-related macular degeneration, right eye, with active choroidal neovascularization: Secondary | ICD-10-CM

## 2015-10-19 ENCOUNTER — Telehealth: Payer: Self-pay | Admitting: Pulmonary Disease

## 2015-10-19 DIAGNOSIS — J432 Centrilobular emphysema: Secondary | ICD-10-CM

## 2015-10-19 NOTE — Telephone Encounter (Signed)
New order has placed. Called made pulm rehab aware. nothing further needed

## 2015-10-20 ENCOUNTER — Encounter (HOSPITAL_COMMUNITY): Payer: Medicare Other

## 2015-10-22 ENCOUNTER — Encounter (HOSPITAL_COMMUNITY): Admission: RE | Admit: 2015-10-22 | Payer: Medicare Other | Source: Ambulatory Visit

## 2015-10-27 ENCOUNTER — Telehealth: Payer: Self-pay | Admitting: Pulmonary Disease

## 2015-10-27 ENCOUNTER — Encounter (HOSPITAL_COMMUNITY)
Admission: RE | Admit: 2015-10-27 | Discharge: 2015-10-27 | Disposition: A | Discharge status: Home / Self Care | Payer: Medicare Other | Source: Ambulatory Visit | Attending: Pulmonary Disease | Admitting: Pulmonary Disease

## 2015-10-27 DIAGNOSIS — J439 Emphysema, unspecified: Secondary | ICD-10-CM

## 2015-10-30 NOTE — Telephone Encounter (Signed)
There is no documentation on this encounter. It will be closed.

## 2015-11-03 ENCOUNTER — Encounter (HOSPITAL_COMMUNITY)
Admission: RE | Admit: 2015-11-03 | Discharge: 2015-11-03 | Disposition: A | Discharge status: Home / Self Care | Payer: Medicare Other | Source: Ambulatory Visit | Attending: Pulmonary Disease | Admitting: Pulmonary Disease

## 2015-11-03 DIAGNOSIS — J439 Emphysema, unspecified: Secondary | ICD-10-CM | POA: Insufficient documentation

## 2015-11-03 NOTE — Progress Notes (Signed)
Gina Fritz started the Pulmonary Maintenance Program today. She tolerated exercise well and will continue to exercise on Tuesday and Thursday.

## 2015-11-05 ENCOUNTER — Encounter (HOSPITAL_COMMUNITY)
Admission: RE | Admit: 2015-11-05 | Discharge: 2015-11-05 | Disposition: A | Discharge status: Home / Self Care | Payer: Medicare Other | Source: Ambulatory Visit | Attending: Pulmonary Disease | Admitting: Pulmonary Disease

## 2015-11-08 ENCOUNTER — Encounter (HOSPITAL_COMMUNITY): Payer: Self-pay | Admitting: *Deleted

## 2015-11-08 ENCOUNTER — Emergency Department (HOSPITAL_COMMUNITY)
Admission: EM | Admit: 2015-11-08 | Discharge: 2015-11-08 | Disposition: A | Discharge status: Home / Self Care | Payer: Medicare Other | Attending: Emergency Medicine | Admitting: Emergency Medicine

## 2015-11-08 DIAGNOSIS — R110 Nausea: Secondary | ICD-10-CM | POA: Diagnosis not present

## 2015-11-08 DIAGNOSIS — I10 Essential (primary) hypertension: Secondary | ICD-10-CM | POA: Insufficient documentation

## 2015-11-08 DIAGNOSIS — Z7982 Long term (current) use of aspirin: Secondary | ICD-10-CM | POA: Insufficient documentation

## 2015-11-08 DIAGNOSIS — Z87891 Personal history of nicotine dependence: Secondary | ICD-10-CM | POA: Insufficient documentation

## 2015-11-08 DIAGNOSIS — Z791 Long term (current) use of non-steroidal anti-inflammatories (NSAID): Secondary | ICD-10-CM | POA: Insufficient documentation

## 2015-11-08 DIAGNOSIS — Z79899 Other long term (current) drug therapy: Secondary | ICD-10-CM | POA: Insufficient documentation

## 2015-11-08 DIAGNOSIS — J449 Chronic obstructive pulmonary disease, unspecified: Secondary | ICD-10-CM | POA: Insufficient documentation

## 2015-11-08 LAB — CBC
HEMATOCRIT: 40.8 % (ref 36.0–46.0)
HEMOGLOBIN: 13.2 g/dL (ref 12.0–15.0)
MCH: 26.7 pg (ref 26.0–34.0)
MCHC: 32.4 g/dL (ref 30.0–36.0)
MCV: 82.6 fL (ref 78.0–100.0)
PLATELETS: 203 10*3/uL (ref 150–400)
RBC: 4.94 MIL/uL (ref 3.87–5.11)
RDW: 14.5 % (ref 11.5–15.5)
WBC: 4.5 10*3/uL (ref 4.0–10.5)

## 2015-11-08 LAB — COMPREHENSIVE METABOLIC PANEL
ALBUMIN: 4.2 g/dL (ref 3.5–5.0)
ALK PHOS: 163 U/L — AB (ref 38–126)
ALT: 20 U/L (ref 14–54)
ANION GAP: 8 (ref 5–15)
AST: 25 U/L (ref 15–41)
BUN: 15 mg/dL (ref 6–20)
CHLORIDE: 104 mmol/L (ref 101–111)
CO2: 27 mmol/L (ref 22–32)
Calcium: 9.7 mg/dL (ref 8.9–10.3)
Creatinine, Ser: 0.74 mg/dL (ref 0.44–1.00)
GFR calc Af Amer: >60 mL/min (ref >60)
GFR calc non Af Amer: >60 mL/min (ref >60)
GLUCOSE: 141 mg/dL — AB (ref 65–99)
POTASSIUM: 4 mmol/L (ref 3.5–5.1)
SODIUM: 139 mmol/L (ref 135–145)
Total Bilirubin: 0.6 mg/dL (ref 0.3–1.2)
Total Protein: 7.5 g/dL (ref 6.5–8.1)

## 2015-11-08 LAB — URINALYSIS, ROUTINE W REFLEX MICROSCOPIC
Bilirubin Urine: NEGATIVE
Glucose, UA: NEGATIVE mg/dL
HGB URINE DIPSTICK: NEGATIVE
Ketones, ur: NEGATIVE mg/dL
Leukocytes, UA: NEGATIVE
NITRITE: NEGATIVE
PROTEIN: 30 mg/dL — AB
SPECIFIC GRAVITY, URINE: 1.023 (ref 1.005–1.030)
pH: 7.5 (ref 5.0–8.0)

## 2015-11-08 LAB — URINE MICROSCOPIC-ADD ON
RBC / HPF: NONE SEEN RBC/hpf (ref 0–5)
WBC UA: NONE SEEN WBC/hpf (ref 0–5)

## 2015-11-08 LAB — LIPASE, BLOOD: LIPASE: 30 U/L (ref 11–51)

## 2015-11-08 MED ORDER — SODIUM CHLORIDE 0.9 % IV BOLUS (SEPSIS)
1000.0000 mL | Freq: Once | INTRAVENOUS | Status: AC
  Administered 2015-11-08: 1000 mL via INTRAVENOUS

## 2015-11-08 MED ORDER — PROCHLORPERAZINE EDISYLATE 5 MG/ML IJ SOLN
10.0000 mg | Freq: Once | INTRAMUSCULAR | Status: AC
  Administered 2015-11-08: 10 mg via INTRAVENOUS
  Filled 2015-11-08: qty 2

## 2015-11-08 NOTE — ED Triage Notes (Signed)
Pt complains of nausea, vomiting, diarrhea since last night. Pt last took zofran at [facility] but states she still feels nauseas.

## 2015-11-08 NOTE — Discharge Instructions (Signed)
As discussed, your evaluation today has been largely reassuring.  But, it is important that you monitor your condition carefully, and do not hesitate to return to the ED if you develop new, or concerning changes in your condition. ? ?Otherwise, please follow-up with your physician for appropriate ongoing care. ? ?

## 2015-11-08 NOTE — ED Provider Notes (Signed)
[facility] [place] [facility] DEPT Provider Note   CSN: [id number] Arrival date & time: 11/08/15  [id number]  First Provider Contact:  None    History   Chief Complaint Chief Complaint  Patient presents with  . Nausea  . Weakness    HPI Gina Fritz is a 75 y.o. female.  HPI  Patient presents with concern of nausea, generalized discomfort.  Symptoms began about 2 days ago, since onset and persistent, without focal pain, lightheadedness, syncope. There is no diarrhea. Patient has a notable recent history of his family members in the hospital, denies other new food, diet, medication, activity.  No relief with Zofran, no other medications used. Past Medical History:  Diagnosis Date  . Allergic rhinitis   . Anxiety   . CAP (community acquired pneumonia)    dx 02-05-2015  . Colitis    dx 02-05-2015  . COPD with emphysema ([place])   . Depression   . Dizziness   . GERD (gastroesophageal reflux disease)   . History of chronic sinusitis   . History of kidney stones   . Hyperlipidemia   . Hypertension   . Irritable bowel syndrome   . Loss of weight   . Pulmonary nodules    benign and stable per CT  . Rash   . Right ureteral stone   . Sensorineural hearing loss, unilateral   . Subjective tinnitus     Patient Active Problem List   Diagnosis Date Noted  . COPD with emphysema ([place]) 05/22/2015  . COPD with asthma ([place]) 05/22/2015  . Malnutrition of moderate degree 02/09/2015  . Abdominal pain 02/05/2015  . Kidney stone on right side 02/05/2015  . Colitis 02/05/2015  . Dizziness and giddiness 04/22/2013  . Abnormality of gait 04/22/2013  . Dizziness     Past Surgical History:  Procedure Laterality Date  . CATARACT EXTRACTION Left   . CYSTO/  LEFT URETEROSCOPIC STONE EXTRACTION/ STENT PLACEMENT  08-10-2009  . ECTOPIC PREGNANCY SURGERY     and Appendectomy  . NASAL SINUS SURGERY    . REMOVAL CYST RIGHT ARM  age 75  . TUBAL LIGATION      OB History    No data available        Home Medications    Prior to Admission medications   Medication Sig Start Date End Date Taking? Authorizing Provider  acetaminophen (TYLENOL) 500 MG tablet Take 1,000 mg by mouth every 6 (six) hours as needed for moderate pain or headache.    Historical Provider, MD  albuterol (PROAIR HFA) 108 (90 Base) MCG/ACT inhaler Inhale 2 puffs into the lungs every 6 (six) hours as needed for wheezing or shortness of breath. 06/23/15   Chesley Mires, MD  aspirin EC 81 MG tablet Take 81 mg by mouth daily.    Historical Provider, MD  atorvastatin (LIPITOR) 40 MG tablet Take 40 mg by mouth daily.    Historical Provider, MD  BESIVANCE 0.6 % SUSP Place 1 drop into the right eye 4 (four) times daily. The day before and the day of the dr visit 11/07/14   Historical Provider, MD  Cholecalciferol (VITAMIN D) 2000 UNITS CAPS Take 2,000 Units by mouth daily.    Historical Provider, MD  DONNATAL 16.2 MG tablet 16.2 tablets daily. 04/18/13   Historical Provider, MD  escitalopram (LEXAPRO) 20 MG tablet  01/20/13   Historical Provider, MD  fenofibrate micronized (ANTARA) 130 MG capsule Take 130 mg by mouth daily. Reported on 07/23/2015 12/17/14   Historical Provider, MD  fluticasone (FLONASE) 50 MCG/ACT nasal spray Place 2 sprays into the nose daily.    Historical Provider, MD  ibuprofen (ADVIL,MOTRIN) 600 MG tablet Take 1 tablet (600 mg total) by mouth every 8 (eight) hours as needed. 02/05/15   Jola Schmidt, MD  loperamide (IMODIUM) 2 MG capsule Take 2-4 mg by mouth 4 (four) times daily as needed for diarrhea or loose stools. For upset stomach    Historical Provider, MD  mometasone-formoterol (DULERA) 100-5 MCG/ACT AERO Inhale 2 puffs into the lungs 2 (two) times daily. 05/22/15   Chesley Mires, MD  montelukast (SINGULAIR) 10 MG tablet Take 10 mg by mouth daily.     Historical Provider, MD  Multiple Vitamin (MULTIVITAMIN WITH MINERALS) TABS Take 1 tablet by mouth daily.    Historical Provider, MD  Multiple Vitamins-Minerals  (ICAPS AREDS 2 PO) Take 1 tablet by mouth 2 (two) times daily.    Historical Provider, MD  ranitidine (ZANTAC) 300 MG tablet Take 300 mg by mouth 2 (two) times daily.    Historical Provider, MD  solifenacin (VESICARE) 10 MG tablet Take 10 mg by mouth daily.     Historical Provider, MD  tamsulosin (FLOMAX) 0.4 MG CAPS capsule Take 1 capsule (0.4 mg total) by mouth daily after breakfast. Patient not taking: Reported on 07/23/2015 02/10/15   Oswald Hillock, MD    Family History Family History  Problem Relation Age of Onset  . Congestive Heart Failure Mother   . Hearing loss Mother   . Stroke Mother     syndrome  . Hypertension Father   . Other      thyroid disorder    Social History Social History  Substance Use Topics  . Smoking status: Former Smoker    Packs/day: 1.00    Years: 25.00    Types: Cigarettes    Quit date: 02/05/2015  . Smokeless tobacco: Never Used  . Alcohol use 0.6 oz/week    1 Glasses of wine per week     Comment: One drink a week.     Allergies   Penicillins; Sulfa antibiotics; and Soy allergy   Review of Systems Review of Systems  Constitutional:       Per HPI, otherwise negative  HENT:       Per HPI, otherwise negative  Respiratory:       Per HPI, otherwise negative  Cardiovascular:       Per HPI, otherwise negative  Gastrointestinal: Positive for nausea. Negative for abdominal pain, constipation and vomiting.  Endocrine:       Negative aside from HPI  Genitourinary:       Neg aside from HPI   Musculoskeletal:       Per HPI, otherwise negative  Skin: Negative.   Neurological: Negative for syncope.     Physical Exam Updated Vital Signs BP 143/68   Pulse 70   Temp 98 F (36.7 C)   Resp 20   SpO2 97%   Physical Exam  Constitutional: She is oriented to person, place, and time. She appears well-developed and well-nourished. No distress.  HENT:  Head: Normocephalic and atraumatic.  Eyes: Conjunctivae and EOM are normal.  Cardiovascular:  Normal rate and regular rhythm.   Pulmonary/Chest: Effort normal and breath sounds normal. No stridor. No respiratory distress.  Abdominal: There is no tenderness.  Musculoskeletal: She exhibits no edema.  Neurological: She is alert and oriented to person, place, and time. No cranial nerve deficit.  Skin: Skin is warm and dry.  Psychiatric: She  has a normal mood and affect.  Nursing note and vitals reviewed.    ED Treatments / Results  Labs (all labs ordered are listed, but only abnormal results are displayed) Labs Reviewed  COMPREHENSIVE METABOLIC PANEL - Abnormal; Notable for the following:       Result Value   Glucose, Bld 141 (*)    Alkaline Phosphatase 163 (*)    All other components within normal limits  URINALYSIS, ROUTINE W REFLEX MICROSCOPIC (NOT AT [facility]) - Abnormal; Notable for the following:    Color, Urine AMBER (*)    APPearance TURBID (*)    Protein, ur 30 (*)    All other components within normal limits  URINE MICROSCOPIC-ADD ON - Abnormal; Notable for the following:    Squamous Epithelial / LPF 0-5 (*)    Bacteria, UA MANY (*)    All other components within normal limits  LIPASE, BLOOD  CBC    Procedures Procedures (including critical care time)  Medications Ordered in ED Medications  sodium chloride 0.9 % bolus 1,000 mL (1,000 mLs Intravenous New Bag/Given 11/08/15 1123)  prochlorperazine (COMPAZINE) injection 10 mg (10 mg Intravenous Given 11/08/15 1124)     Initial Impression / Assessment and Plan / ED Course  I have reviewed the triage vital signs and the nursing notes.  Pertinent labs & imaging results that were available during my care of the patient were reviewed by me and considered in my medical decision making (see chart for details).  Clinical Course    1:26 PM Sx resolved. We discussed all findings at length.  Final Clinical Impressions(s) / ED Diagnoses  Patient presents with nausea or Patient is elderly, with multiple medical issues,  and broad differential was considered. Labs, vitals reassuring, symptoms resolved entirely. With reassuring labs, vitals, no abdominal tenderness, low suspicion for occult hepatobiliary dysfunction, pancreatitis, other acute abdominal processes. No evidence for UTI. Patient discharged in stable condition.   Carmin Muskrat, MD 11/08/15 1329

## 2015-11-09 ENCOUNTER — Telehealth (HOSPITAL_COMMUNITY): Payer: Self-pay | Admitting: *Deleted

## 2015-11-10 ENCOUNTER — Encounter (HOSPITAL_COMMUNITY): Admission: RE | Admit: 2015-11-10 | Payer: Medicare Other | Source: Ambulatory Visit

## 2015-11-11 DIAGNOSIS — I10 Essential (primary) hypertension: Secondary | ICD-10-CM | POA: Diagnosis not present

## 2015-11-11 DIAGNOSIS — K2900 Acute gastritis without bleeding: Secondary | ICD-10-CM | POA: Diagnosis not present

## 2015-11-11 DIAGNOSIS — R110 Nausea: Secondary | ICD-10-CM | POA: Diagnosis not present

## 2015-11-11 DIAGNOSIS — N2889 Other specified disorders of kidney and ureter: Secondary | ICD-10-CM | POA: Diagnosis not present

## 2015-11-11 DIAGNOSIS — Q283 Other malformations of cerebral vessels: Secondary | ICD-10-CM | POA: Diagnosis not present

## 2015-11-11 DIAGNOSIS — I129 Hypertensive chronic kidney disease with stage 1 through stage 4 chronic kidney disease, or unspecified chronic kidney disease: Secondary | ICD-10-CM | POA: Diagnosis not present

## 2015-11-11 DIAGNOSIS — Z79899 Other long term (current) drug therapy: Secondary | ICD-10-CM | POA: Diagnosis not present

## 2015-11-11 DIAGNOSIS — N200 Calculus of kidney: Secondary | ICD-10-CM | POA: Diagnosis not present

## 2015-11-11 DIAGNOSIS — R112 Nausea with vomiting, unspecified: Secondary | ICD-10-CM | POA: Diagnosis not present

## 2015-11-11 DIAGNOSIS — R51 Headache: Secondary | ICD-10-CM | POA: Diagnosis not present

## 2015-11-11 DIAGNOSIS — N189 Chronic kidney disease, unspecified: Secondary | ICD-10-CM | POA: Diagnosis not present

## 2015-11-11 DIAGNOSIS — K449 Diaphragmatic hernia without obstruction or gangrene: Secondary | ICD-10-CM | POA: Diagnosis not present

## 2015-11-11 DIAGNOSIS — J449 Chronic obstructive pulmonary disease, unspecified: Secondary | ICD-10-CM | POA: Diagnosis not present

## 2015-11-12 ENCOUNTER — Encounter (HOSPITAL_COMMUNITY): Payer: Medicare Other

## 2015-11-12 DIAGNOSIS — F329 Major depressive disorder, single episode, unspecified: Secondary | ICD-10-CM | POA: Diagnosis not present

## 2015-11-12 DIAGNOSIS — I10 Essential (primary) hypertension: Secondary | ICD-10-CM | POA: Diagnosis not present

## 2015-11-12 DIAGNOSIS — F419 Anxiety disorder, unspecified: Secondary | ICD-10-CM | POA: Diagnosis not present

## 2015-11-13 ENCOUNTER — Emergency Department (HOSPITAL_COMMUNITY): Payer: Medicare Other

## 2015-11-13 ENCOUNTER — Encounter (INDEPENDENT_AMBULATORY_CARE_PROVIDER_SITE_OTHER): Payer: Medicare Other | Admitting: Ophthalmology

## 2015-11-13 ENCOUNTER — Emergency Department (HOSPITAL_COMMUNITY)
Admission: EM | Admit: 2015-11-13 | Discharge: 2015-11-13 | Disposition: A | Discharge status: Home / Self Care | Payer: Medicare Other | Source: Home / Self Care | Attending: Emergency Medicine | Admitting: Emergency Medicine

## 2015-11-13 ENCOUNTER — Encounter (HOSPITAL_COMMUNITY): Payer: Self-pay | Admitting: Emergency Medicine

## 2015-11-13 DIAGNOSIS — Z7982 Long term (current) use of aspirin: Secondary | ICD-10-CM

## 2015-11-13 DIAGNOSIS — R112 Nausea with vomiting, unspecified: Secondary | ICD-10-CM | POA: Diagnosis not present

## 2015-11-13 DIAGNOSIS — Z87891 Personal history of nicotine dependence: Secondary | ICD-10-CM | POA: Insufficient documentation

## 2015-11-13 DIAGNOSIS — Z79899 Other long term (current) drug therapy: Secondary | ICD-10-CM

## 2015-11-13 DIAGNOSIS — E876 Hypokalemia: Secondary | ICD-10-CM | POA: Diagnosis not present

## 2015-11-13 DIAGNOSIS — E785 Hyperlipidemia, unspecified: Secondary | ICD-10-CM | POA: Diagnosis not present

## 2015-11-13 DIAGNOSIS — J449 Chronic obstructive pulmonary disease, unspecified: Secondary | ICD-10-CM | POA: Insufficient documentation

## 2015-11-13 DIAGNOSIS — IMO0001 Reserved for inherently not codable concepts without codable children: Secondary | ICD-10-CM

## 2015-11-13 DIAGNOSIS — G43901 Migraine, unspecified, not intractable, with status migrainosus: Secondary | ICD-10-CM | POA: Diagnosis not present

## 2015-11-13 DIAGNOSIS — K219 Gastro-esophageal reflux disease without esophagitis: Secondary | ICD-10-CM | POA: Diagnosis not present

## 2015-11-13 DIAGNOSIS — H905 Unspecified sensorineural hearing loss: Secondary | ICD-10-CM | POA: Diagnosis not present

## 2015-11-13 DIAGNOSIS — R51 Headache: Secondary | ICD-10-CM | POA: Insufficient documentation

## 2015-11-13 DIAGNOSIS — R030 Elevated blood-pressure reading, without diagnosis of hypertension: Secondary | ICD-10-CM | POA: Diagnosis not present

## 2015-11-13 DIAGNOSIS — I10 Essential (primary) hypertension: Secondary | ICD-10-CM | POA: Insufficient documentation

## 2015-11-13 DIAGNOSIS — R519 Headache, unspecified: Secondary | ICD-10-CM

## 2015-11-13 LAB — COMPREHENSIVE METABOLIC PANEL
ALBUMIN: 4.4 g/dL (ref 3.5–5.0)
ALT: 18 U/L (ref 14–54)
AST: 25 U/L (ref 15–41)
Alkaline Phosphatase: 156 U/L — ABNORMAL HIGH (ref 38–126)
Anion gap: 12 (ref 5–15)
BUN: 7 mg/dL (ref 6–20)
CHLORIDE: 98 mmol/L — AB (ref 101–111)
CO2: 27 mmol/L (ref 22–32)
CREATININE: 0.83 mg/dL (ref 0.44–1.00)
Calcium: 9.9 mg/dL (ref 8.9–10.3)
GFR calc Af Amer: >60 mL/min (ref >60)
GFR calc non Af Amer: >60 mL/min (ref >60)
GLUCOSE: 127 mg/dL — AB (ref 65–99)
POTASSIUM: 3.5 mmol/L (ref 3.5–5.1)
SODIUM: 137 mmol/L (ref 135–145)
Total Bilirubin: 1.2 mg/dL (ref 0.3–1.2)
Total Protein: 7.8 g/dL (ref 6.5–8.1)

## 2015-11-13 LAB — CBC WITH DIFFERENTIAL/PLATELET
BASOS ABS: 0 10*3/uL (ref 0.0–0.1)
BASOS PCT: 1 %
EOS ABS: 0.1 10*3/uL (ref 0.0–0.7)
EOS PCT: 1 %
HCT: 43.4 % (ref 36.0–46.0)
Hemoglobin: 14.3 g/dL (ref 12.0–15.0)
LYMPHS PCT: 36 %
Lymphs Abs: 1.6 10*3/uL (ref 0.7–4.0)
MCH: 27.2 pg (ref 26.0–34.0)
MCHC: 32.9 g/dL (ref 30.0–36.0)
MCV: 82.5 fL (ref 78.0–100.0)
Monocytes Absolute: 0.5 10*3/uL (ref 0.1–1.0)
Monocytes Relative: 12 %
Neutro Abs: 2.2 10*3/uL (ref 1.7–7.7)
Neutrophils Relative %: 50 %
PLATELETS: 221 10*3/uL (ref 150–400)
RBC: 5.26 MIL/uL — AB (ref 3.87–5.11)
RDW: 14.1 % (ref 11.5–15.5)
WBC: 4.3 10*3/uL (ref 4.0–10.5)

## 2015-11-13 LAB — BRAIN NATRIURETIC PEPTIDE: B Natriuretic Peptide: 64.9 pg/mL (ref 0.0–100.0)

## 2015-11-13 LAB — I-STAT TROPONIN, ED: Troponin i, poc: 0 ng/mL (ref 0.00–0.08)

## 2015-11-13 MED ORDER — PROCHLORPERAZINE EDISYLATE 5 MG/ML IJ SOLN
10.0000 mg | Freq: Once | INTRAMUSCULAR | Status: AC
  Administered 2015-11-13: 10 mg via INTRAVENOUS
  Filled 2015-11-13: qty 2

## 2015-11-13 MED ORDER — SODIUM CHLORIDE 0.9 % IV BOLUS (SEPSIS)
1000.0000 mL | Freq: Once | INTRAVENOUS | Status: AC
  Administered 2015-11-13: 1000 mL via INTRAVENOUS

## 2015-11-13 MED ORDER — METOCLOPRAMIDE HCL 10 MG PO TABS: 10.0000 mg | ORAL_TABLET | Freq: Four times a day (QID) | ORAL | 0 refills | Status: DC

## 2015-11-13 MED ORDER — DIPHENHYDRAMINE HCL 50 MG/ML IJ SOLN
12.5000 mg | Freq: Once | INTRAMUSCULAR | Status: AC
Start: 2015-11-13 — End: 2015-11-13
  Administered 2015-11-13: 12.5 mg via INTRAVENOUS
  Filled 2015-11-13: qty 1

## 2015-11-13 MED ORDER — HYDRALAZINE HCL 20 MG/ML IJ SOLN
5.0000 mg | Freq: Once | INTRAMUSCULAR | Status: AC
  Administered 2015-11-13: 5 mg via INTRAVENOUS
  Filled 2015-11-13: qty 1

## 2015-11-13 NOTE — ED Triage Notes (Signed)
Has been off bp meds b ecause her bp had been being low but last weekend she was at [facility] and then she was seen at [place] and dx with  Gastritis and now her bp will not go down

## 2015-11-13 NOTE — Discharge Instructions (Signed)
Please take Reglan every 6 hours as needed for nausea/vomiting. This medication will also help with headaches. You can also continue taking tylenol for your headache as needed as well.  If you are still relying on medications to control your nausea and vomiting on Wednesday, follow up with your primary physician or return to ED for re-evaluation.  Follow BRAT diet. Stay hydrated with small sips of water throughout the day.  Continue taking your Losartan-HCTZ as directed each morning daily. Schedule an appointment with your cardiologist at their next available appointment to discuss your blood pressure.  Return to ER for new or worsening symptoms, any additional concerns.

## 2015-11-13 NOTE — ED Notes (Signed)
Per daughter, patient medications adjusted on Sunday when patient was here, but bp meds not working.   Patient was also seen Wed by primary doctor.  Patient still having pressures over 200.

## 2015-11-13 NOTE — ED Provider Notes (Signed)
[facility] DEPT Provider Note   CSN: [id number] Arrival date & time: 11/13/15  1131  First Provider Contact:  First MD Initiated Contact with Patient 11/13/15 1314        History   Chief Complaint Chief Complaint  Patient presents with  . Hypertension    HPI Gina Fritz is a 75 y.o. female.  The history is provided by the patient, a relative, the spouse and medical records. No language interpreter was used.   Gina Fritz is a 75 y.o. female  with a PMH of HTN, HLD, anxiety and depression who presents to the Emergency Department complaining of persistent nausea and headache for one week. Patient states symptoms started on 8/05. She was seen at [facility] on 8/06 where labs and urine were obtained and told she probably had a virus. She improved with fluids and compazine in the ED. When she got home, nausea and vomiting continued. She then went to [facility] ED on 8/09 where a thorough evaluation was performed. A CT abdomen was performed and unremarkable. She also had blood work, CT head and MRV that was reassuring. She was discharged home with carafate and Pepcid. Throughout this time frame, patient states her blood pressure has been elevated. Family at bedside reports blood pressures as high as 204/105 at home rechecks. When she was discharged from [facility], she was started on losartan 50 mg. She then followed up with her primary care physician yesterday, 8/10. Her blood pressure medicine was changed to Hyzaar 50-12.5. Tylenol take for headache with little relief. Family at bedside report that despite multiple visits, nausea/vomiting and elevated blood pressure have not improved. She denies chest pain, shortness of breath, slurred speech, gait abnormalities, visual changes, or any other associated symptoms.    Past Medical History:  Diagnosis Date  . Allergic rhinitis   . Anxiety   . CAP (community acquired pneumonia)    dx 02-05-2015  . Colitis    dx 02-05-2015  .  COPD with emphysema ([place])   . Depression   . Dizziness   . GERD (gastroesophageal reflux disease)   . History of chronic sinusitis   . History of kidney stones   . Hyperlipidemia   . Hypertension   . Irritable bowel syndrome   . Loss of weight   . Pulmonary nodules    benign and stable per CT  . Rash   . Right ureteral stone   . Sensorineural hearing loss, unilateral   . Subjective tinnitus     Patient Active Problem List   Diagnosis Date Noted  . COPD with emphysema ([place]) 05/22/2015  . 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 Malnutrition of moderate degree 02/09/2015  . Abdominal pain 02/05/2015  . Kidney stone on right side 02/05/2015  . Colitis 02/05/2015  . Dizziness and giddiness 04/22/2013  . Abnormality of gait 04/22/2013  . Dizziness     Past Surgical History:  Procedure Laterality Date  . CATARACT EXTRACTION Left   . CYSTO/  LEFT URETEROSCOPIC STONE EXTRACTION/ STENT PLACEMENT  08-10-2009  . ECTOPIC PREGNANCY SURGERY     and Appendectomy  . NASAL SINUS SURGERY    . REMOVAL CYST RIGHT ARM  age 39  . TUBAL LIGATION      OB History    No data available       Home Medications    Prior to Admission medications   Medication Sig Start Date End Date Taking? Authorizing Provider  acetaminophen (TYLENOL) 500 MG  tablet Take 1,000 mg by mouth every 6 (six) hours as needed for moderate pain or headache.   Yes Historical Provider, MD  albuterol (PROAIR HFA) 108 (90 Base) MCG/ACT inhaler Inhale 2 puffs into the lungs every 6 (six) hours as needed for wheezing or shortness of breath. 06/23/15  Yes Chesley Mires, MD  aspirin EC 81 MG tablet Take 81 mg by mouth daily.   Yes Historical Provider, MD  atorvastatin (LIPITOR) 40 MG tablet Take 40 mg by mouth daily.   Yes Historical Provider, MD  BESIVANCE 0.6 % SUSP Place 1 drop into the right eye 4 (four) times daily. The day before and the day of the dr visit 11/07/14  Yes Historical Provider, MD  Cholecalciferol (VITAMIN D) 2000  UNITS CAPS Take 2,000 Units by mouth daily.   Yes Historical Provider, MD  DONNATAL 16.2 MG tablet Take 16.2 tablets by mouth daily.  04/18/13  Yes Historical Provider, MD  escitalopram (LEXAPRO) 20 MG tablet Take 20 mg by mouth daily.  01/20/13  Yes Historical Provider, MD  famotidine (PEPCID) 40 MG tablet Take 40 mg by mouth daily.   Yes Historical Provider, MD  fluticasone (FLONASE) 50 MCG/ACT nasal spray Place 2 sprays into the nose daily.   Yes Historical Provider, MD  loperamide (IMODIUM) 2 MG capsule Take 2-4 mg by mouth 4 (four) times daily as needed for diarrhea or loose stools. For upset stomach   Yes Historical Provider, MD  losartan-hydrochlorothiazide (HYZAAR) 50-12.5 MG tablet Take 1 tablet by mouth daily.   Yes Historical Provider, MD  montelukast (SINGULAIR) 10 MG tablet Take 10 mg by mouth daily.    Yes Historical Provider, MD  Multiple Vitamin (MULTIVITAMIN WITH MINERALS) TABS Take 1 tablet by mouth daily.   Yes Historical Provider, MD  Multiple Vitamins-Minerals (ICAPS AREDS 2 PO) Take 1 tablet by mouth 2 (two) times daily.   Yes Historical Provider, MD  polyethylene glycol powder (GLYCOLAX/MIRALAX) powder Take 1 Container by mouth once.   Yes Historical Provider, MD  ranitidine (ZANTAC) 300 MG tablet Take 300 mg by mouth 2 (two) times daily.   Yes Historical Provider, MD  solifenacin (VESICARE) 10 MG tablet Take 10 mg by mouth daily.    Yes Historical Provider, MD  sucralfate (CARAFATE) 1 GM/10ML suspension Take 1 g by mouth 4 (four) times daily.   Yes Historical Provider, MD  metoCLOPramide (REGLAN) 10 MG tablet Take 1 tablet (10 mg total) by mouth every 6 (six) hours. 11/13/15   Wataru Mccowen Pilcher Lynett Brasil, PA-C  mometasone-formoterol (DULERA) 100-5 MCG/ACT AERO Inhale 2 puffs into the lungs 2 (two) times daily. Patient not taking: Reported on 11/13/2015 05/22/15   Chesley Mires, MD  tamsulosin [facility]) 0.4 MG CAPS capsule Take 1 capsule (0.4 mg total) by mouth daily after breakfast. Patient  not taking: Reported on 07/23/2015 02/10/15   Oswald Hillock, MD    Family History Family History  Problem Relation Age of Onset  . Congestive Heart Failure Mother   . Hearing loss Mother   . Stroke Mother     syndrome  . Hypertension Father   . Other      thyroid disorder    Social History Social History  Substance Use Topics  . Smoking status: Former Smoker    Packs/day: 1.00    Years: 25.00    Types: Cigarettes    Quit date: 02/05/2015  . Smokeless tobacco: Never Used  . Alcohol use 0.6 oz/week    1 Glasses of wine per  week     Comment: One drink a week.     Allergies   Penicillins; Sulfa antibiotics; and Soy allergy   Review of Systems Review of Systems  Constitutional: Negative for chills and fever.  HENT: Negative for congestion.   Eyes: Negative for visual disturbance.  Respiratory: Negative for cough and shortness of breath.   Cardiovascular: Negative.   Gastrointestinal: Positive for nausea and vomiting. Negative for abdominal pain.  Genitourinary: Negative for dysuria.  Musculoskeletal: Negative for back pain and neck pain.  Skin: Negative for rash.  Neurological: Positive for headaches. Negative for dizziness and weakness.     Physical Exam Updated Vital Signs BP 164/99   Pulse 72   Temp 98.2 F (36.8 C) (Oral)   Resp 17   Wt 52.2 kg   SpO2 100%   BMI 21.03 kg/m   Physical Exam  Constitutional: She is oriented to person, place, and time. She appears well-developed and well-nourished. No distress.  HENT:  Head: Normocephalic and atraumatic.  Cardiovascular: Normal rate, regular rhythm, normal heart sounds and intact distal pulses.  Exam reveals no gallop and no friction rub.   No murmur heard. Pulmonary/Chest: Effort normal and breath sounds normal. No respiratory distress. She has no wheezes. She has no rales. She exhibits no tenderness.  Abdominal: Soft. Bowel sounds are normal. She exhibits no distension. There is no tenderness.    Musculoskeletal: She exhibits no edema.  Neurological: She is alert and oriented to person, place, and time.  CN II-XII grossly intact. Able to ambulate in ED with steady gait. 5/5 muscle strength in upper and lower extremities bilaterally including strong and equal grip strength and dorsiflexion/plantar flexion. Sensory to light touch normal in all four extremities. Normal finger-to-nose and rapid alternating movements.  Skin: Skin is warm and dry.  Nursing note and vitals reviewed.    ED Treatments / Results  Labs (all labs ordered are listed, but only abnormal results are displayed) Labs Reviewed  CBC WITH DIFFERENTIAL/PLATELET - Abnormal; Notable for the following:       Result Value   RBC 5.26 (*)    All other components within normal limits  COMPREHENSIVE METABOLIC PANEL - Abnormal; Notable for the following:    Chloride 98 (*)    Glucose, Bld 127 (*)    Alkaline Phosphatase 156 (*)    All other components within normal limits  BRAIN NATRIURETIC PEPTIDE  I-STAT TROPOININ, ED    EKG  EKG Interpretation None       Radiology Ct Head Wo Contrast  Result Date: 11/13/2015 CLINICAL DATA:  75 year old female with a history of frontal headache and hypertension EXAM: CT HEAD WITHOUT CONTRAST TECHNIQUE: Contiguous axial images were obtained from the base of the skull through the vertex without intravenous contrast. COMPARISON:  MRI 05/06/1998 51 FINDINGS: Unremarkable appearance of the calvarium without acute fracture or aggressive lesion. Unremarkable appearance of the scalp soft tissues. Unremarkable appearance of the bilateral orbits. Surgical changes of right maxillary antrectomy. Trace mucosal disease of the right maxillary antrum. Mastoid air cells are clear. No significant paranasal sinus disease No acute intracranial hemorrhage. No midline shift. No mass effect. Gray-white differentiation maintained. Vague hypodensity in the centrum semi of al of the left hemisphere lateral to  the posterior lateral ventricle, was present on the prior MR. 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 IMPRESSION: No CT evidence of acute intracranial abnormality. Signed, Dulcy Fanny. Earleen Newport, DO [facility] Radiology Electronically Signed  By: Corrie Mckusick D.O.   On: 11/13/2015 14:43    Procedures Procedures (including critical care time)  Medications Ordered in ED Medications  sodium chloride 0.9 % bolus 1,000 mL (0 mLs Intravenous Stopped 11/13/15 1533)  prochlorperazine (COMPAZINE) injection 10 mg (10 mg Intravenous Given 11/13/15 1439)  hydrALAZINE (APRESOLINE) injection 5 mg (5 mg Intravenous Given 11/13/15 1438)  diphenhydrAMINE (BENADRYL) injection 12.5 mg (12.5 mg Intravenous Given 11/13/15 1438)     Initial Impression / Assessment and Plan / ED Course  I have reviewed the triage vital signs and the nursing notes.  Pertinent labs & imaging results that were available during my care of the patient were reviewed by me and considered in my medical decision making (see chart for details).  Clinical Course   Violanda Mascorro Wortham is a 75 y.o. female who presents to ED for persistent n/v and headache x 1 week .Also concerned about elevated BP's at home.  Seen in ED x 2 and seen by PCP yesterday for same. Started on BP medications. BP here slightly elevated. Hydralazine given with improvement. Benadryl, compazine and fluids given. Will evaluate for signs of hypertensive emergency including trop, CT head, basic labs.   Patient re-evaluated and feels much improved after fluids and meds. CT head negative. No signs of end organ damage on labs. No visual changes. Will discharge to home with reglan for nausea/headache. Tylenol PRN headache as well. BRAT diet and hydration discussed. Follow up with PCP or cards for recheck and further evaluation of BP.   Patient discharged in stable condition. Patient and family at bedside  understand follow up care instructions and reasons to return to ED. All questions answered.   Patient seen by and discussed with Dr. Ellender Hose who agrees with treatment plan.  Final Clinical Impressions(s) / ED Diagnoses   Final diagnoses:  Nausea and vomiting in adult patient  Elevated blood pressure  Nonintractable headache, unspecified chronicity pattern, unspecified headache type    New Prescriptions New Prescriptions   METOCLOPRAMIDE (REGLAN) 10 MG TABLET    Take 1 tablet (10 mg total) by mouth every 6 (six) hours.     [facility] [place] Kinzler, PA-C 11/13/15 1633    Duffy Bruce, MD 11/15/15 [place], MD 11/15/15 [phone]

## 2015-11-14 ENCOUNTER — Inpatient Hospital Stay (HOSPITAL_COMMUNITY)
Admission: EM | Admit: 2015-11-14 | Discharge: 2015-11-20 | DRG: 103 | Disposition: A | Discharge status: Skilled Nursing Facility | Payer: Medicare Other | Attending: Internal Medicine | Admitting: Internal Medicine

## 2015-11-14 ENCOUNTER — Encounter (HOSPITAL_COMMUNITY): Payer: Self-pay

## 2015-11-14 DIAGNOSIS — R112 Nausea with vomiting, unspecified: Secondary | ICD-10-CM | POA: Diagnosis present

## 2015-11-14 DIAGNOSIS — I10 Essential (primary) hypertension: Secondary | ICD-10-CM | POA: Diagnosis present

## 2015-11-14 DIAGNOSIS — R262 Difficulty in walking, not elsewhere classified: Secondary | ICD-10-CM | POA: Diagnosis not present

## 2015-11-14 DIAGNOSIS — R51 Headache: Secondary | ICD-10-CM | POA: Diagnosis not present

## 2015-11-14 DIAGNOSIS — Z7982 Long term (current) use of aspirin: Secondary | ICD-10-CM | POA: Diagnosis not present

## 2015-11-14 DIAGNOSIS — R739 Hyperglycemia, unspecified: Secondary | ICD-10-CM | POA: Diagnosis present

## 2015-11-14 DIAGNOSIS — Z7951 Long term (current) use of inhaled steroids: Secondary | ICD-10-CM

## 2015-11-14 DIAGNOSIS — R1110 Vomiting, unspecified: Secondary | ICD-10-CM

## 2015-11-14 DIAGNOSIS — R278 Other lack of coordination: Secondary | ICD-10-CM | POA: Diagnosis not present

## 2015-11-14 DIAGNOSIS — K588 Other irritable bowel syndrome: Secondary | ICD-10-CM | POA: Diagnosis not present

## 2015-11-14 DIAGNOSIS — G43901 Migraine, unspecified, not intractable, with status migrainosus: Principal | ICD-10-CM | POA: Diagnosis present

## 2015-11-14 DIAGNOSIS — J45901 Unspecified asthma with (acute) exacerbation: Secondary | ICD-10-CM | POA: Diagnosis not present

## 2015-11-14 DIAGNOSIS — G43A1 Cyclical vomiting, intractable: Secondary | ICD-10-CM | POA: Diagnosis not present

## 2015-11-14 DIAGNOSIS — E569 Vitamin deficiency, unspecified: Secondary | ICD-10-CM | POA: Diagnosis not present

## 2015-11-14 DIAGNOSIS — J45909 Unspecified asthma, uncomplicated: Secondary | ICD-10-CM | POA: Diagnosis not present

## 2015-11-14 DIAGNOSIS — G4452 New daily persistent headache (NDPH): Secondary | ICD-10-CM | POA: Diagnosis not present

## 2015-11-14 DIAGNOSIS — Z9842 Cataract extraction status, left eye: Secondary | ICD-10-CM

## 2015-11-14 DIAGNOSIS — G44011 Episodic cluster headache, intractable: Secondary | ICD-10-CM | POA: Diagnosis not present

## 2015-11-14 DIAGNOSIS — K219 Gastro-esophageal reflux disease without esophagitis: Secondary | ICD-10-CM | POA: Diagnosis present

## 2015-11-14 DIAGNOSIS — R2689 Other abnormalities of gait and mobility: Secondary | ICD-10-CM | POA: Diagnosis not present

## 2015-11-14 DIAGNOSIS — G4709 Other insomnia: Secondary | ICD-10-CM | POA: Diagnosis not present

## 2015-11-14 DIAGNOSIS — H905 Unspecified sensorineural hearing loss: Secondary | ICD-10-CM | POA: Diagnosis present

## 2015-11-14 DIAGNOSIS — J449 Chronic obstructive pulmonary disease, unspecified: Secondary | ICD-10-CM | POA: Diagnosis present

## 2015-11-14 DIAGNOSIS — E871 Hypo-osmolality and hyponatremia: Secondary | ICD-10-CM | POA: Diagnosis not present

## 2015-11-14 DIAGNOSIS — I160 Hypertensive urgency: Secondary | ICD-10-CM | POA: Diagnosis present

## 2015-11-14 DIAGNOSIS — T7840XA Allergy, unspecified, initial encounter: Secondary | ICD-10-CM | POA: Diagnosis not present

## 2015-11-14 DIAGNOSIS — Z87891 Personal history of nicotine dependence: Secondary | ICD-10-CM | POA: Diagnosis not present

## 2015-11-14 DIAGNOSIS — J4489 Other specified chronic obstructive pulmonary disease: Secondary | ICD-10-CM | POA: Diagnosis present

## 2015-11-14 DIAGNOSIS — E876 Hypokalemia: Secondary | ICD-10-CM | POA: Diagnosis not present

## 2015-11-14 DIAGNOSIS — E860 Dehydration: Secondary | ICD-10-CM | POA: Diagnosis present

## 2015-11-14 DIAGNOSIS — R52 Pain, unspecified: Secondary | ICD-10-CM | POA: Diagnosis not present

## 2015-11-14 DIAGNOSIS — E785 Hyperlipidemia, unspecified: Secondary | ICD-10-CM | POA: Diagnosis present

## 2015-11-14 DIAGNOSIS — R519 Headache, unspecified: Secondary | ICD-10-CM | POA: Diagnosis present

## 2015-11-14 DIAGNOSIS — M6281 Muscle weakness (generalized): Secondary | ICD-10-CM | POA: Diagnosis not present

## 2015-11-14 DIAGNOSIS — F329 Major depressive disorder, single episode, unspecified: Secondary | ICD-10-CM | POA: Diagnosis present

## 2015-11-14 DIAGNOSIS — N3281 Overactive bladder: Secondary | ICD-10-CM | POA: Diagnosis not present

## 2015-11-14 LAB — URINALYSIS, ROUTINE W REFLEX MICROSCOPIC
BILIRUBIN URINE: NEGATIVE
Glucose, UA: NEGATIVE mg/dL
Hgb urine dipstick: NEGATIVE
Ketones, ur: NEGATIVE mg/dL
Leukocytes, UA: NEGATIVE
NITRITE: NEGATIVE
Protein, ur: 100 mg/dL — AB
SPECIFIC GRAVITY, URINE: 1.013 (ref 1.005–1.030)
pH: 7.5 (ref 5.0–8.0)

## 2015-11-14 LAB — CBC
HEMATOCRIT: 39.7 % (ref 36.0–46.0)
HEMOGLOBIN: 13.2 g/dL (ref 12.0–15.0)
MCH: 26.9 pg (ref 26.0–34.0)
MCHC: 33.2 g/dL (ref 30.0–36.0)
MCV: 80.9 fL (ref 78.0–100.0)
PLATELETS: 202 10*3/uL (ref 150–400)
RBC: 4.91 MIL/uL (ref 3.87–5.11)
RDW: 14 % (ref 11.5–15.5)
WBC: 4.5 10*3/uL (ref 4.0–10.5)

## 2015-11-14 LAB — URINE MICROSCOPIC-ADD ON

## 2015-11-14 LAB — BASIC METABOLIC PANEL
ANION GAP: 12 (ref 5–15)
BUN: 5 mg/dL — ABNORMAL LOW (ref 6–20)
CHLORIDE: 97 mmol/L — AB (ref 101–111)
CO2: 25 mmol/L (ref 22–32)
Calcium: 9.5 mg/dL (ref 8.9–10.3)
Creatinine, Ser: 0.65 mg/dL (ref 0.44–1.00)
GFR calc non Af Amer: >60 mL/min (ref >60)
Glucose, Bld: 105 mg/dL — ABNORMAL HIGH (ref 65–99)
POTASSIUM: 2.8 mmol/L — AB (ref 3.5–5.1)
SODIUM: 134 mmol/L — AB (ref 135–145)

## 2015-11-14 LAB — SEDIMENTATION RATE: Sed Rate: 45 mm/hr — ABNORMAL HIGH (ref 0–22)

## 2015-11-14 LAB — TROPONIN I

## 2015-11-14 LAB — I-STAT TROPONIN, ED: Troponin i, poc: 0.02 ng/mL (ref 0.00–0.08)

## 2015-11-14 LAB — PHOSPHORUS: PHOSPHORUS: 3.7 mg/dL (ref 2.5–4.6)

## 2015-11-14 LAB — MAGNESIUM: MAGNESIUM: 1.7 mg/dL (ref 1.7–2.4)

## 2015-11-14 MED ORDER — METOPROLOL TARTRATE 5 MG/5ML IV SOLN
5.0000 mg | Freq: Once | INTRAVENOUS | Status: AC
  Administered 2015-11-14: 5 mg via INTRAVENOUS
  Filled 2015-11-14: qty 5

## 2015-11-14 MED ORDER — HYDROCODONE-ACETAMINOPHEN 5-325 MG PO TABS
1.0000 | ORAL_TABLET | ORAL | Status: DC | PRN
  Administered 2015-11-15 (×2): 2 via ORAL
  Filled 2015-11-14 (×2): qty 2

## 2015-11-14 MED ORDER — HYDRALAZINE HCL 20 MG/ML IJ SOLN: 5.0000 mg | Freq: Once | INTRAMUSCULAR | Status: DC

## 2015-11-14 MED ORDER — SODIUM CHLORIDE 0.9% FLUSH
3.0000 mL | Freq: Two times a day (BID) | INTRAVENOUS | Status: DC
  Administered 2015-11-15 – 2015-11-20 (×10): 3 mL via INTRAVENOUS

## 2015-11-14 MED ORDER — MONTELUKAST SODIUM 10 MG PO TABS
10.0000 mg | ORAL_TABLET | Freq: Every day | ORAL | Status: DC
  Administered 2015-11-15 – 2015-11-19 (×5): 10 mg via ORAL
  Filled 2015-11-14 (×5): qty 1

## 2015-11-14 MED ORDER — SODIUM CHLORIDE 0.9 % IV BOLUS (SEPSIS)
1000.0000 mL | Freq: Once | INTRAVENOUS | Status: AC
  Administered 2015-11-14: 1000 mL via INTRAVENOUS

## 2015-11-14 MED ORDER — FENTANYL CITRATE (PF) 100 MCG/2ML IJ SOLN
100.0000 ug | Freq: Once | INTRAMUSCULAR | Status: AC
  Administered 2015-11-14: 100 ug via INTRAVENOUS
  Filled 2015-11-14: qty 2

## 2015-11-14 MED ORDER — FAMOTIDINE 20 MG PO TABS
40.0000 mg | ORAL_TABLET | Freq: Every day | ORAL | Status: DC
  Administered 2015-11-15: 40 mg via ORAL
  Filled 2015-11-14: qty 2

## 2015-11-14 MED ORDER — ACETAMINOPHEN 325 MG PO TABS
650.0000 mg | ORAL_TABLET | Freq: Four times a day (QID) | ORAL | Status: DC | PRN
  Administered 2015-11-15 – 2015-11-19 (×6): 650 mg via ORAL
  Filled 2015-11-14 (×6): qty 2

## 2015-11-14 MED ORDER — ONDANSETRON HCL 4 MG PO TABS
4.0000 mg | ORAL_TABLET | Freq: Four times a day (QID) | ORAL | Status: DC | PRN
  Administered 2015-11-16: 4 mg via ORAL
  Filled 2015-11-14: qty 1

## 2015-11-14 MED ORDER — POTASSIUM CHLORIDE 10 MEQ/100ML IV SOLN
10.0000 meq | INTRAVENOUS | Status: AC
  Administered 2015-11-14 – 2015-11-15 (×4): 10 meq via INTRAVENOUS
  Filled 2015-11-14 (×3): qty 100

## 2015-11-14 MED ORDER — SODIUM CHLORIDE 0.9 % IV SOLN
INTRAVENOUS | Status: AC
  Administered 2015-11-15: 04:00:00 via INTRAVENOUS

## 2015-11-14 MED ORDER — SUCRALFATE 1 GM/10ML PO SUSP
1.0000 g | Freq: Four times a day (QID) | ORAL | Status: DC
  Administered 2015-11-15 – 2015-11-16 (×6): 1 g via ORAL
  Filled 2015-11-14 (×6): qty 10

## 2015-11-14 MED ORDER — ENOXAPARIN SODIUM 40 MG/0.4ML ~~LOC~~ SOLN
40.0000 mg | SUBCUTANEOUS | Status: DC
  Administered 2015-11-15 – 2015-11-17 (×3): 40 mg via SUBCUTANEOUS
  Filled 2015-11-14 (×3): qty 0.4

## 2015-11-14 MED ORDER — MAGNESIUM SULFATE IN D5W 1-5 GM/100ML-% IV SOLN
1.0000 g | Freq: Once | INTRAVENOUS | Status: AC
  Administered 2015-11-15: 1 g via INTRAVENOUS
  Filled 2015-11-14 (×2): qty 100

## 2015-11-14 MED ORDER — METOCLOPRAMIDE HCL 5 MG/ML IJ SOLN
10.0000 mg | Freq: Once | INTRAMUSCULAR | Status: AC
  Administered 2015-11-14: 10 mg via INTRAVENOUS
  Filled 2015-11-14: qty 2

## 2015-11-14 MED ORDER — FLUTICASONE PROPIONATE 50 MCG/ACT NA SUSP
2.0000 | Freq: Every day | NASAL | Status: DC
  Administered 2015-11-15 – 2015-11-20 (×6): 2 via NASAL
  Filled 2015-11-14: qty 16

## 2015-11-14 MED ORDER — ACETAMINOPHEN 650 MG RE SUPP: 650.0000 mg | Freq: Four times a day (QID) | RECTAL | Status: DC | PRN

## 2015-11-14 MED ORDER — ONDANSETRON HCL 4 MG/2ML IJ SOLN
4.0000 mg | Freq: Four times a day (QID) | INTRAMUSCULAR | Status: DC | PRN
  Administered 2015-11-15 – 2015-11-16 (×2): 4 mg via INTRAVENOUS
  Filled 2015-11-14 (×2): qty 2

## 2015-11-14 MED ORDER — ATORVASTATIN CALCIUM 40 MG PO TABS
40.0000 mg | ORAL_TABLET | Freq: Every day | ORAL | Status: DC
  Administered 2015-11-15 – 2015-11-19 (×5): 40 mg via ORAL
  Filled 2015-11-14 (×4): qty 1
  Filled 2015-11-14: qty 2

## 2015-11-14 MED ORDER — MOMETASONE FURO-FORMOTEROL FUM 100-5 MCG/ACT IN AERO
2.0000 | INHALATION_SPRAY | Freq: Two times a day (BID) | RESPIRATORY_TRACT | Status: DC
  Administered 2015-11-15 – 2015-11-20 (×9): 2 via RESPIRATORY_TRACT
  Filled 2015-11-14 (×2): qty 8.8

## 2015-11-14 MED ORDER — MAGNESIUM SULFATE 50 % IJ SOLN: 1.0000 g | Freq: Once | INTRAMUSCULAR | Status: DC

## 2015-11-14 MED ORDER — PROCHLORPERAZINE EDISYLATE 5 MG/ML IJ SOLN
10.0000 mg | Freq: Four times a day (QID) | INTRAMUSCULAR | Status: DC | PRN
  Administered 2015-11-15 – 2015-11-16 (×2): 10 mg via INTRAVENOUS
  Filled 2015-11-14 (×2): qty 2

## 2015-11-14 MED ORDER — MORPHINE SULFATE (PF) 4 MG/ML IV SOLN
4.0000 mg | Freq: Once | INTRAVENOUS | Status: AC
  Administered 2015-11-14: 4 mg via INTRAVENOUS
  Filled 2015-11-14: qty 1

## 2015-11-14 MED ORDER — HYDRALAZINE HCL 20 MG/ML IJ SOLN
10.0000 mg | INTRAMUSCULAR | Status: DC | PRN
  Administered 2015-11-16 (×2): 10 mg via INTRAVENOUS
  Filled 2015-11-14 (×2): qty 1

## 2015-11-14 MED ORDER — METOCLOPRAMIDE HCL 5 MG/ML IJ SOLN
5.0000 mg | Freq: Once | INTRAMUSCULAR | Status: AC
  Administered 2015-11-14: 5 mg via INTRAVENOUS
  Filled 2015-11-14: qty 2

## 2015-11-14 MED ORDER — POLYETHYLENE GLYCOL 3350 17 G PO PACK
1.0000 | PACK | Freq: Two times a day (BID) | ORAL | Status: DC | PRN
  Administered 2015-11-16 – 2015-11-19 (×3): 17 g via ORAL
  Filled 2015-11-14 (×4): qty 1

## 2015-11-14 MED ORDER — ESCITALOPRAM OXALATE 10 MG PO TABS
20.0000 mg | ORAL_TABLET | Freq: Every day | ORAL | Status: DC
  Administered 2015-11-15 – 2015-11-20 (×6): 20 mg via ORAL
  Filled 2015-11-14 (×7): qty 2

## 2015-11-14 MED ORDER — ASPIRIN EC 81 MG PO TBEC
81.0000 mg | DELAYED_RELEASE_TABLET | Freq: Every day | ORAL | Status: DC
  Administered 2015-11-15: 81 mg via ORAL
  Filled 2015-11-14: qty 1

## 2015-11-14 MED ORDER — LOSARTAN POTASSIUM 50 MG PO TABS
100.0000 mg | ORAL_TABLET | Freq: Every day | ORAL | Status: DC
  Administered 2015-11-15 – 2015-11-20 (×6): 100 mg via ORAL
  Filled 2015-11-14 (×6): qty 2

## 2015-11-14 MED ORDER — ALBUTEROL SULFATE (2.5 MG/3ML) 0.083% IN NEBU: 2.5000 mg | INHALATION_SOLUTION | Freq: Four times a day (QID) | RESPIRATORY_TRACT | Status: DC | PRN

## 2015-11-14 MED ORDER — DARIFENACIN HYDROBROMIDE ER 7.5 MG PO TB24
7.5000 mg | ORAL_TABLET | Freq: Every day | ORAL | Status: DC
  Filled 2015-11-14: qty 1

## 2015-11-14 MED ORDER — BOOST / RESOURCE BREEZE PO LIQD
1.0000 | Freq: Three times a day (TID) | ORAL | Status: DC
  Administered 2015-11-15 – 2015-11-18 (×6): 1 via ORAL

## 2015-11-14 MED ORDER — POTASSIUM CHLORIDE CRYS ER 20 MEQ PO TBCR
40.0000 meq | EXTENDED_RELEASE_TABLET | Freq: Once | ORAL | Status: AC
  Administered 2015-11-14: 40 meq via ORAL
  Filled 2015-11-14: qty 2

## 2015-11-14 MED ORDER — SODIUM CHLORIDE 0.9 % IV BOLUS (SEPSIS)
1000.0000 mL | Freq: Once | INTRAVENOUS | Status: AC
Start: 2015-11-15 — End: 2015-11-14
  Administered 2015-11-14: 1000 mL via INTRAVENOUS

## 2015-11-14 NOTE — ED Triage Notes (Signed)
Patient here for 4th time in past week. Patient here with ongoing headache and ongoing vomiting. Took phenergan suppository with no relief. Alert and oriented on arrival. No active vomiting

## 2015-11-14 NOTE — ED Provider Notes (Signed)
[facility] DEPT Provider Note   CSN: [id number] Arrival date & time: 11/14/15  1454  First Provider Contact:  First MD Initiated Contact with Patient 11/14/15 [place]        History   Chief Complaint Chief Complaint  Patient presents with  . Headache  . Emesis   HPI Gina Fritz is a 75 y.o. female. Complains of frontal headache gradual onset 1 week ago. Symptoms accompanied by multiple episodes of vomiting. Was noted to have high blood pressure, treated with losartan and subsequently changed to losartan-HCTZ also prescribed Reglan orally and Phenergan suppositories however she continues to vomit despite treatment. She denies any chest pain. She's been evaluated 3 times in the emergency department this past week, not including today as well as seen by her primary care physician for same complaint. She continues to have headache and multiple episodes of vomiting today. No other associated symptoms nothing makes symptoms better or worse. No visual changes no fever no trauma. Her daughters reports she had an MRV done at [facility] which was negative  HPI  Past Medical History:  Diagnosis Date  . Allergic rhinitis   . Anxiety   . CAP (community acquired pneumonia)    dx 02-05-2015  . Colitis    dx 02-05-2015  . COPD with emphysema ([place])   . Depression   . Dizziness   . GERD (gastroesophageal reflux disease)   . History of chronic sinusitis   . History of kidney stones   . Hyperlipidemia   . Hypertension   . Irritable bowel syndrome   . Loss of weight   . Pulmonary nodules    benign and stable per CT  . Rash   . Right ureteral stone   . Sensorineural hearing loss, unilateral   . Subjective tinnitus     Patient Active Problem List   Diagnosis Date Noted  . COPD with emphysema ([place]) 05/22/2015  . COPD with asthma ([place]) 05/22/2015  . Malnutrition of moderate degree 02/09/2015  . Abdominal pain 02/05/2015  . Kidney stone on right side 02/05/2015  . Colitis 02/05/2015    . Dizziness and giddiness 04/22/2013  . Abnormality of gait 04/22/2013  . Dizziness     Past Surgical History:  Procedure Laterality Date  . CATARACT EXTRACTION Left   . CYSTO/  LEFT URETEROSCOPIC STONE EXTRACTION/ STENT PLACEMENT  08-10-2009  . ECTOPIC PREGNANCY SURGERY     and Appendectomy  . NASAL SINUS SURGERY    . REMOVAL CYST RIGHT ARM  age 32  . TUBAL LIGATION      OB History    No data available       Home Medications    Prior to Admission medications   Medication Sig Start Date End Date Taking? Authorizing Provider  albuterol (PROAIR HFA) 108 (90 Base) MCG/ACT inhaler Inhale 2 puffs into the lungs every 6 (six) hours as needed for wheezing or shortness of breath. 06/23/15  Yes Chesley Mires, MD  aspirin EC 81 MG tablet Take 81 mg by mouth daily.   Yes Historical Provider, MD  atorvastatin (LIPITOR) 40 MG tablet Take 40 mg by mouth daily.   Yes Historical Provider, MD  BESIVANCE 0.6 % SUSP Place 1 drop into the right eye 4 (four) times daily. The day before and the day of the dr visit 11/07/14  Yes Historical Provider, MD  Cholecalciferol (VITAMIN D) 2000 UNITS CAPS Take 2,000 Units by mouth daily.   Yes Historical Provider, MD  DONNATAL 16.2 MG tablet  Take 16.2 tablets by mouth daily as needed (ibs).  04/18/13  Yes Historical Provider, MD  escitalopram (LEXAPRO) 20 MG tablet Take 20 mg by mouth daily.  01/20/13  Yes Historical Provider, MD  famotidine (PEPCID) 40 MG tablet Take 40 mg by mouth daily.   Yes Historical Provider, MD  fluticasone (FLONASE) 50 MCG/ACT nasal spray Place 2 sprays into the nose daily.   Yes Historical Provider, MD  loperamide (IMODIUM) 2 MG capsule Take 2-4 mg by mouth 4 (four) times daily as needed for diarrhea or loose stools. For upset stomach   Yes Historical Provider, MD  losartan (COZAAR) 50 MG tablet Take 50 mg by mouth daily.   Yes Historical Provider, MD  losartan-hydrochlorothiazide (HYZAAR) 50-12.5 MG tablet Take 1 tablet by mouth daily.    Yes Historical Provider, MD  metoCLOPramide (REGLAN) 10 MG tablet Take 1 tablet (10 mg total) by mouth every 6 (six) hours. 11/13/15  Yes Jaime Pilcher Ward, PA-C  montelukast (SINGULAIR) 10 MG tablet Take 10 mg by mouth daily.    Yes Historical Provider, MD  Multiple Vitamin (MULTIVITAMIN WITH MINERALS) TABS Take 1 tablet by mouth daily.   Yes Historical Provider, MD  Multiple Vitamins-Minerals (ICAPS AREDS 2 PO) Take 1 tablet by mouth 2 (two) times daily.   Yes Historical Provider, MD  polyethylene glycol powder (GLYCOLAX/MIRALAX) powder Take 1 Container by mouth once.   Yes Historical Provider, MD  promethazine (PHENERGAN) 12.5 MG suppository Place 12.5 mg rectally every 6 (six) hours as needed for nausea or vomiting.   Yes Historical Provider, MD  ranitidine (ZANTAC) 300 MG tablet Take 300 mg by mouth 2 (two) times daily.   Yes Historical Provider, MD  solifenacin (VESICARE) 10 MG tablet Take 10 mg by mouth daily.    Yes Historical Provider, MD  sucralfate (CARAFATE) 1 GM/10ML suspension Take 1 g by mouth 4 (four) times daily.   Yes Historical Provider, MD  acetaminophen (TYLENOL) 500 MG tablet Take 1,000 mg by mouth every 6 (six) hours as needed for moderate pain or headache.    Historical Provider, MD  mometasone-formoterol (DULERA) 100-5 MCG/ACT AERO Inhale 2 puffs into the lungs 2 (two) times daily. Patient not taking: Reported on 11/13/2015 05/22/15   Chesley Mires, MD  tamsulosin [facility]) 0.4 MG CAPS capsule Take 1 capsule (0.4 mg total) by mouth daily after breakfast. Patient not taking: Reported on 07/23/2015 02/10/15   Oswald Hillock, MD    Family History Family History  Problem Relation Age of Onset  . Congestive Heart Failure Mother   . Hearing loss Mother   . Stroke Mother     syndrome  . Hypertension Father   . Other      thyroid disorder    Social History Social History  Substance Use Topics  . Smoking status: Former Smoker    Packs/day: 1.00    Years: 25.00    Types:  Cigarettes    Quit date: 02/05/2015  . Smokeless tobacco: Never Used  . Alcohol use 0.6 oz/week    1 Glasses of wine per week     Comment: One drink a week.     Allergies   Penicillins; Sulfa antibiotics; and Soy allergy   Review of Systems Review of Systems  Constitutional: Negative.   Respiratory: Negative.   Cardiovascular: Negative.   Gastrointestinal: Positive for nausea and vomiting.  Musculoskeletal: Negative.   Skin: Negative.   Neurological: Positive for headaches.  Psychiatric/Behavioral: Negative.   All other systems reviewed and  are negative.    Physical Exam Updated Vital Signs BP 168/72 (BP Location: Left Arm)   Pulse 82   Temp 97.9 F (36.6 C) (Oral)   Resp 20   Wt 115 lb (52.2 kg)   SpO2 99%   BMI 21.03 kg/m   Physical Exam  Constitutional: She is oriented to person, place, and time. She appears well-developed and well-nourished. She appears distressed.  Appears uncomfortable Glasgow Coma Score 15  HENT:  Head: Normocephalic and atraumatic.  Eyes: Conjunctivae are normal. Pupils are equal, round, and reactive to light.  Optic disc is not well visualized no retinal hemorrhages  Neck: Neck supple. No tracheal deviation present. No thyromegaly present.  Cardiovascular: Normal rate and regular rhythm.   No murmur heard. Pulmonary/Chest: Effort normal and breath sounds normal.  Abdominal: Soft. Bowel sounds are normal. She exhibits no distension. There is no tenderness.  Musculoskeletal: Normal range of motion. She exhibits no edema or tenderness.  Neurological: She is alert and oriented to person, place, and time. She has normal reflexes. No cranial nerve deficit. Coordination normal.  DTRs symmetric bilaterally at knee jerk ankle jerk and biceps toes downward going bilaterally  Skin: Skin is warm and dry. No rash noted.  Psychiatric: She has a normal mood and affect.  Nursing note and vitals reviewed.    ED Treatments / Results  Labs (all labs  ordered are listed, but only abnormal results are displayed) Labs Reviewed  [place], ED    EKG  EKG Interpretation  Date/Time:  Saturday November 14 2015 17:06:13 EDT Ventricular Rate:  62 PR Interval:    QRS Duration: 86 QT Interval:  428 QTC Calculation: 435 R Axis:   48 Text Interpretation:  Sinus rhythm Multiple premature complexes, vent & supraven Borderline T abnormalities, anterior leads No significant change since last tracing Confirmed by Winfred Leeds  MD, Donell Tomkins [phone]) on 11/14/2015 5:16:20 PM       Radiology Ct Head Wo Contrast  Result Date: 11/13/2015 CLINICAL DATA:  75 year old female with a history of frontal headache and hypertension EXAM: CT HEAD WITHOUT CONTRAST TECHNIQUE: Contiguous axial images were obtained from the base of the skull through the vertex without intravenous contrast. COMPARISON:  MRI 05/06/1998 51 FINDINGS: Unremarkable appearance of the calvarium without acute fracture or aggressive lesion. Unremarkable appearance of the scalp soft tissues. Unremarkable appearance of the bilateral orbits. Surgical changes of right maxillary antrectomy. Trace mucosal disease of the right maxillary antrum. Mastoid air cells are clear. No significant paranasal sinus disease No acute intracranial hemorrhage. No midline shift. No mass effect. Gray-white differentiation maintained. Vague hypodensity in the centrum semi of al of the left hemisphere lateral to the posterior lateral ventricle, was present on the prior MR. 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 IMPRESSION: No CT evidence of acute intracranial abnormality. Signed, Dulcy Fanny. Earleen Newport, DO [facility] Radiology Electronically Signed   By: Corrie Mckusick D.O.   On: 11/13/2015 14:43   Results for orders placed or performed during the hospital encounter of [id number]  Basic metabolic panel    Result Value Ref Range   Sodium 134 (L) 135 - 145 mmol/L   Potassium 2.8 (L) 3.5 - 5.1 mmol/L   Chloride 97 (L) 101 - 111 mmol/L   CO2 25 22 - 32 mmol/L   Glucose, Bld 105 (H) 65 - 99 mg/dL   BUN 5 (L) 6 - 20 mg/dL  Creatinine, Ser 0.65 0.44 - 1.00 mg/dL   Calcium 9.5 8.9 - 10.3 mg/dL   GFR calc non Af Amer >60 >60 mL/min   GFR calc Af Amer >60 >60 mL/min   Anion gap 12 5 - 15  CBC  Result Value Ref Range   WBC 4.5 4.0 - 10.5 K/uL   RBC 4.91 3.87 - 5.11 MIL/uL   Hemoglobin 13.2 12.0 - 15.0 g/dL   HCT 39.7 36.0 - 46.0 %   MCV 80.9 78.0 - 100.0 fL   MCH 26.9 26.0 - 34.0 pg   MCHC 33.2 30.0 - 36.0 g/dL   RDW 14.0 11.5 - 15.5 %   Platelets 202 150 - 400 K/uL  Sedimentation rate  Result Value Ref Range   Sed Rate 45 (H) 0 - 22 mm/hr  I-stat troponin, ED  Result Value Ref Range   Troponin i, poc 0.02 0.00 - 0.08 ng/mL   Comment 3           Ct Head Wo Contrast  Result Date: 11/13/2015 CLINICAL DATA:  75 year old female with a history of frontal headache and hypertension EXAM: CT HEAD WITHOUT CONTRAST TECHNIQUE: Contiguous axial images were obtained from the base of the skull through the vertex without intravenous contrast. COMPARISON:  MRI 05/06/1998 51 FINDINGS: Unremarkable appearance of the calvarium without acute fracture or aggressive lesion. Unremarkable appearance of the scalp soft tissues. Unremarkable appearance of the bilateral orbits. Surgical changes of right maxillary antrectomy. Trace mucosal disease of the right maxillary antrum. Mastoid air cells are clear. No significant paranasal sinus disease No acute intracranial hemorrhage. No midline shift. No mass effect. Gray-white differentiation maintained. Vague hypodensity in the centrum semi of al of the left hemisphere lateral to the posterior lateral ventricle, was present on the prior MR. 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 IMPRESSION: No CT evidence of acute intracranial abnormality.  Signed, Dulcy Fanny. Earleen Newport, DO [facility] Radiology Electronically Signed   By: Corrie Mckusick D.O.   On: 11/13/2015 14:43   Procedures Procedures (including critical care time)  Medications Ordered in ED Medications  sodium chloride 0.9 % bolus 1,000 mL (not administered)  fentaNYL (SUBLIMAZE) injection 100 mcg (not administered)  metoCLOPramide (REGLAN) injection 10 mg (not administered)     Initial Impression / Assessment and Plan / ED Course  I have reviewed the triage vital signs and the nursing notes.  Pertinent labs & imaging results that were available during my care of the patient were reviewed by me and considered in my medical decision making (see chart for details).  Clinical Course  Comment By Time  Feels improved after treatment with intravenous opioids, intravenous fluids and intravenous antiemetics. Oral potassium ordered. Dr Roel Cluck consulted. Will arrange for overnight stay Orlie Dakin, MD 08/12 1914    I see no signs of hypertensive encephalopathy. Patient requires symptomatic control and blood pressure control  Final Clinical Impressions(s) / ED Diagnoses   Final diagnoses:  None   .edthis New Prescriptions New Prescriptions   No medications on file  Diagnoses #1 intractable headache #2 nausea and vomiting #3 hypokalemia #4 hypertension   Orlie Dakin, MD 11/14/15 [id number]

## 2015-11-14 NOTE — H&P (Signed)
Gina Fritz [id number] DOB: 09-Aug-1940 DOA: 11/14/2015     PCP: Stephens Shire, MD  [facility]: Pulmonology [facility],   Urology [facility] cardiology  [facility] Patient coming from:    home Lives   With family    Chief Complaint: headache, nausea and vomiting  HPI: Gina Fritz is a 75 y.o. female with medical history significant of COPD, hypertension, irritable bowel syndrome, Migraine with out headaches.     Presented with headache nausea vomiting  for the past 8 days she has been seen for this in emergency department on the 8/06 Has not been able to tolerate all blood pressure medications. No sick contacts. Headache seemed to come together with nausea, no diarrhea. She has been constipated. Last BM was with mira lax yesterday. She is able to pass gas. Denies feeling light headed, no fever no chills.  Been having nausea and generalized discomfort her family members has been in the hospital recently she attempted to use Zofran without improvement she was rehydrated and given Compazine symptoms have resolved there was no Evidence of UTI. She returned her symptoms resumed Patient since that also has been seen at [facility] on 08/09 diagnosed with Gastritis. CT abdomen was negative, CT head and MRV negative, Her medications were adjusted due to hypotension and since then she started to have episodes of hypertension up to 200's. At [facility] she was started on losartan 50 mg on 8/10 she was seen by PCP and changed to Hyzaar 50 -12.5. Her headache she attempted to use Tylenol but it did not seem to help  Her nausea and headache has persisted severe hypertension has persisted she had not had any chest pain or shortness of breath no slurred speech. She was seen in ER 8/11 and he did with hydralazine 5 mg Compazine 10 mg and given 1 L of normal saline. He seemed better after IV fluids and medications CT scan of the head was done again and was negative she was discharged home  with Reglan and Tylenol when necessary headaches. She presented back again today with ongoing headache and vomiting despite taking Phenergan suppository Headache seem to have improved after blood pressure was well controlled     IN ER: afebrile, 97% HR 57 BP 153/111  K 2.8 troponin less than 0.03,  magnesium 1.7 sodium 134 creatinine 0.65   Hospitalist was called for admission for dehydration persistent nausea and vomiting,   Review of Systems:    Pertinent positives include: nausea, vomiting, headache  Constitutional:  No weight loss, night sweats, Fevers, chills, fatigue, weight loss  HEENT:  No headaches, Difficulty swallowing,Tooth/dental problems,Sore throat,  No sneezing, itching, ear ache, nasal congestion, post nasal drip,  Cardio-vascular:  No chest pain, Orthopnea, PND, anasarca, dizziness, palpitations.no Bilateral lower extremity swelling  GI:  No heartburn, indigestion, abdominal pain, diarrhea, change in bowel habits, loss of appetite, melena, blood in stool, hematemesis Resp:  no shortness of breath at rest. No dyspnea on exertion, No excess mucus, no productive cough, No non-productive cough, No coughing up of blood.No change in color of mucus.No wheezing. Skin:  no rash or lesions. No jaundice GU:  no dysuria, change in color of urine, no urgency or frequency. No straining to urinate.  No flank pain.  Musculoskeletal:  No joint pain or no joint swelling. No decreased range of motion. No back pain.  Psych:  No change in mood or affect. No depression or anxiety. No memory loss.  Neuro: no localizing neurological complaints,  no tingling, no weakness, no double vision, no gait abnormality, no slurred speech, no confusion  As per HPI otherwise 10 point review of systems negative.   Past Medical History: Past Medical History:  Diagnosis Date  . Allergic rhinitis   . Anxiety   . CAP (community acquired pneumonia)    dx 02-05-2015  . Colitis    dx 02-05-2015  .  COPD with emphysema ([place])   . Depression   . Dizziness   . GERD (gastroesophageal reflux disease)   . History of chronic sinusitis   . History of kidney stones   . Hyperlipidemia   . Hypertension   . Irritable bowel syndrome   . Loss of weight   . Pulmonary nodules    benign and stable per CT  . Rash   . Right ureteral stone   . Sensorineural hearing loss, unilateral   . Subjective tinnitus    Past Surgical History:  Procedure Laterality Date  . CATARACT EXTRACTION Left   . CYSTO/  LEFT URETEROSCOPIC STONE EXTRACTION/ STENT PLACEMENT  08-10-2009  . ECTOPIC PREGNANCY SURGERY     and Appendectomy  . NASAL SINUS SURGERY    . REMOVAL CYST RIGHT ARM  age 55  . TUBAL LIGATION       Social History:  Ambulatory   Independently     reports that she quit smoking about 9 months ago. Her smoking use included Cigarettes. She has a 25.00 pack-year smoking history. She has never used smokeless tobacco. She reports that she drinks about 0.6 oz of alcohol per week . She reports that she does not use drugs.  Allergies:   Allergies  Allergen Reactions  . Penicillins Anaphylaxis    Has patient had a PCN reaction causing immediate rash, facial/tongue/throat swelling, SOB or lightheadedness with hypotension: Yes Has patient had a PCN reaction causing severe rash involving mucus membranes or skin necrosis: Yes Has patient had a PCN reaction that required hospitalization Yes Has patient had a PCN reaction occurring within the last 10 years: No If all of the above answers are "NO", then may proceed with Cephalosporin use.   . Sulfa Antibiotics Hives  . Soy Allergy Itching and Rash      Family History:   Family History  Problem Relation Age of Onset  . Congestive Heart Failure Mother   . Hearing loss Mother   . Stroke Mother     syndrome  . Hypertension Father   . Other      thyroid disorder    Medications: Prior to Admission medications   Medication Sig Start Date End Date  Taking? Authorizing Provider  albuterol (PROAIR HFA) 108 (90 Base) MCG/ACT inhaler Inhale 2 puffs into the lungs every 6 (six) hours as needed for wheezing or shortness of breath. 06/23/15  Yes Chesley Mires, MD  aspirin EC 81 MG tablet Take 81 mg by mouth daily.   Yes Historical Provider, MD  atorvastatin (LIPITOR) 40 MG tablet Take 40 mg by mouth daily.   Yes Historical Provider, MD  BESIVANCE 0.6 % SUSP Place 1 drop into the right eye 4 (four) times daily. The day before and the day of the dr visit 11/07/14  Yes Historical Provider, MD  Cholecalciferol (VITAMIN D) 2000 UNITS CAPS Take 2,000 Units by mouth daily.   Yes Historical Provider, MD  DONNATAL 16.2 MG tablet Take 16.2 tablets by mouth daily as needed (ibs).  04/18/13  Yes Historical Provider, MD  escitalopram (LEXAPRO) 20 MG tablet Take 20  mg by mouth daily.  01/20/13  Yes Historical Provider, MD  famotidine (PEPCID) 40 MG tablet Take 40 mg by mouth daily.   Yes Historical Provider, MD  fluticasone (FLONASE) 50 MCG/ACT nasal spray Place 2 sprays into the nose daily.   Yes Historical Provider, MD  loperamide (IMODIUM) 2 MG capsule Take 2-4 mg by mouth 4 (four) times daily as needed for diarrhea or loose stools. For upset stomach   Yes Historical Provider, MD  losartan (COZAAR) 50 MG tablet Take 50 mg by mouth daily.   Yes Historical Provider, MD  losartan-hydrochlorothiazide (HYZAAR) 50-12.5 MG tablet Take 1 tablet by mouth daily.   Yes Historical Provider, MD  metoCLOPramide (REGLAN) 10 MG tablet Take 1 tablet (10 mg total) by mouth every 6 (six) hours. 11/13/15  Yes Jaime Pilcher Ward, PA-C  montelukast (SINGULAIR) 10 MG tablet Take 10 mg by mouth daily.    Yes Historical Provider, MD  Multiple Vitamin (MULTIVITAMIN WITH MINERALS) TABS Take 1 tablet by mouth daily.   Yes Historical Provider, MD  Multiple Vitamins-Minerals (ICAPS AREDS 2 PO) Take 1 tablet by mouth 2 (two) times daily.   Yes Historical Provider, MD  polyethylene glycol powder  (GLYCOLAX/MIRALAX) powder Take 1 Container by mouth once.   Yes Historical Provider, MD  promethazine (PHENERGAN) 12.5 MG suppository Place 12.5 mg rectally every 6 (six) hours as needed for nausea or vomiting.   Yes Historical Provider, MD  ranitidine (ZANTAC) 300 MG tablet Take 300 mg by mouth 2 (two) times daily.   Yes Historical Provider, MD  solifenacin (VESICARE) 10 MG tablet Take 10 mg by mouth daily.    Yes Historical Provider, MD  sucralfate (CARAFATE) 1 GM/10ML suspension Take 1 g by mouth 4 (four) times daily.   Yes Historical Provider, MD  acetaminophen (TYLENOL) 500 MG tablet Take 1,000 mg by mouth every 6 (six) hours as needed for moderate pain or headache.    Historical Provider, MD  mometasone-formoterol (DULERA) 100-5 MCG/ACT AERO Inhale 2 puffs into the lungs 2 (two) times daily. Patient not taking: Reported on 11/13/2015 05/22/15   Chesley Mires, MD  tamsulosin [facility]) 0.4 MG CAPS capsule Take 1 capsule (0.4 mg total) by mouth daily after breakfast. Patient not taking: Reported on 07/23/2015 02/10/15   Oswald Hillock, MD    Physical Exam: Patient Vitals for the past 24 hrs:  BP Temp Temp src Pulse Resp SpO2 Weight  11/14/15 1930 (!) 158/102 - - 70 21 95 % -  11/14/15 1900 166/83 - - 65 22 97 % -  11/14/15 1730 176/81 - - 64 20 94 % -  11/14/15 1700 185/93 - - 63 16 99 % -  11/14/15 1658 168/72 - - - - - -  11/14/15 1510 - - - - - - 52.2 kg (115 lb)  11/14/15 1509 (!) 177/109 97.9 F (36.6 C) Oral 82 20 99 % -    1. General:  in No Acute distress 2. Psychological: Alert and   Oriented 3. Head/ENT:    Dry Mucous Membranes                          Head Non traumatic, neck supple                            Poor Dentition 4. SKIN:  decreased Skin turgor,  Skin clean Dry and intact no rash 5. Heart: Regular rate  and rhythm no  Murmur, Rub or gallop 6. Lungs: , no wheezes or crackles   7. Abdomen: Soft, non-tender, Non distended 8. Lower extremities: no clubbing, cyanosis, or  edema 9. Neurologically He 5 out of 5 in no 4 extremities cranial nerves II through XII intact 10. MSK: Normal range of motion   body mass index is 21.03 kg/m.  Labs on Admission:   Labs on Admission: I have personally reviewed following labs and imaging studies  CBC:  Recent Labs Lab 11/08/15 0933 11/13/15 1147 11/14/15 1710  WBC 4.5 4.3 4.5  NEUTROABS  --  2.2  --   HGB 13.2 14.3 13.2  HCT 40.8 43.4 39.7  MCV 82.6 82.5 80.9  PLT 203 221 [id number]   Basic Metabolic Panel:  Recent Labs Lab 11/08/15 0933 11/13/15 1147 11/14/15 1710  NA 139 137 134*  K 4.0 3.5 2.8*  CL 104 98* 97*  CO2 27 27 25   GLUCOSE 141* 127* 105*  BUN 15 7 5*  CREATININE 0.74 0.83 0.65  CALCIUM 9.7 9.9 9.5   GFR: Estimated Creatinine Clearance: 48.8 mL/min (by C-G formula based on SCr of 0.8 mg/dL). Liver Function Tests:  Recent Labs Lab 11/08/15 0933 11/13/15 1147  AST 25 25  ALT 20 18  ALKPHOS 163* 156*  BILITOT 0.6 1.2  PROT 7.5 7.8  ALBUMIN 4.2 4.4    Recent Labs Lab 11/08/15 0933  LIPASE 30   No results for input(s): AMMONIA in the last 168 hours. Coagulation Profile: No results for input(s): INR, PROTIME in the last 168 hours. Cardiac Enzymes: No results for input(s): CKTOTAL, CKMB, CKMBINDEX, TROPONINI in the last 168 hours. BNP (last 3 results) No results for input(s): PROBNP in the last 8760 hours. HbA1C: No results for input(s): HGBA1C in the last 72 hours. CBG: No results for input(s): GLUCAP in the last 168 hours. Lipid Profile: No results for input(s): CHOL, HDL, LDLCALC, TRIG, CHOLHDL, LDLDIRECT in the last 72 hours. Thyroid Function Tests: No results for input(s): TSH, T4TOTAL, FREET4, T3FREE, THYROIDAB in the last 72 hours. Anemia Panel: No results for input(s): VITAMINB12, FOLATE, FERRITIN, TIBC, IRON, RETICCTPCT in the last 72 hours. Urine analysis:    Component Value Date/Time   COLORURINE AMBER (A) 11/08/2015 0901   APPEARANCEUR TURBID (A) 11/08/2015  0901   LABSPEC 1.023 11/08/2015 0901   PHURINE 7.5 11/08/2015 0901   GLUCOSEU NEGATIVE 11/08/2015 0901   HGBUR NEGATIVE 11/08/2015 0901   BILIRUBINUR NEGATIVE 11/08/2015 0901   KETONESUR NEGATIVE 11/08/2015 0901   PROTEINUR 30 (A) 11/08/2015 0901   UROBILINOGEN 1.0 02/05/2015 1339   NITRITE NEGATIVE 11/08/2015 0901   LEUKOCYTESUR NEGATIVE 11/08/2015 0901   Sepsis Labs: @LABRCNTIP (procalcitonin:4,lacticidven:4) )No results found for this or any previous visit (from the past 240 hour(s)).    UA ordered  No results found for: HGBA1C  Estimated Creatinine Clearance: 48.8 mL/min (by C-G formula based on SCr of 0.8 mg/dL).  BNP (last 3 results) No results for input(s): PROBNP in the last 8760 hours.   ECG REPORT  Independently reviewed Rate: 62  Rhythm:  ST&T Change: No acute ischemic changes   QTC 435  Filed Weights   11/14/15 1510  Weight: 52.2 kg (115 lb)     Cultures:    Component Value Date/Time   SDES URINE, CLEAN CATCH 02/05/2015 1339   SPECREQUEST NONE 02/05/2015 1339   CULT  02/05/2015 1339    60,000 COLONIES/ml DIPHTHEROIDS(CORYNEBACTERIUM SPECIES) Performed at [facility] 02/07/2015 FINAL  02/05/2015 1339     Radiological Exams on Admission: Ct Head Wo Contrast  Result Date: 11/13/2015 CLINICAL DATA:  75 year old female with a history of frontal headache and hypertension EXAM: CT HEAD WITHOUT CONTRAST TECHNIQUE: Contiguous axial images were obtained from the base of the skull through the vertex without intravenous contrast. COMPARISON:  MRI 05/06/1998 51 FINDINGS: Unremarkable appearance of the calvarium without acute fracture or aggressive lesion. Unremarkable appearance of the scalp soft tissues. Unremarkable appearance of the bilateral orbits. Surgical changes of right maxillary antrectomy. Trace mucosal disease of the right maxillary antrum. Mastoid air cells are clear. No significant paranasal sinus disease No acute intracranial  hemorrhage. No midline shift. No mass effect. Gray-white differentiation maintained. Vague hypodensity in the centrum semi of al of the left hemisphere lateral to the posterior lateral ventricle, was present on the prior MR. 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 IMPRESSION: No CT evidence of acute intracranial abnormality. Signed, Dulcy Fanny. Earleen Newport, DO [facility] Radiology Electronically Signed   By: Corrie Mckusick D.O.   On: 11/13/2015 14:43    Chart has been reviewed    Assessment/Plan  75 y.o. female with medical history significant of COPD, hypertension, irritable bowel syndrome, Migraine without headaches.  Here with dehydration and intractable nausea vomiting and hypertensive urgency vs migraine headache  Present on Admission: . Hypertensive urgency - hold hydrochlorothiazide but continue losartan hydralazine when necessary blood pressure currently improved patient appears to be much improved will admit and continue to monitor cycle cardiac enzymes,  . Headache Acne seemed to resolve in the setting of hypertensive urgency, given improvement will hold off on MRI at this point patient have had MRV and repeated CT scans. She is Neurologically intact . COPD with asthma ([place]) - currently stable continue home medications . Intractable nausea and vomiting - currently seem to be improving we will continue Zofran and Compazine when necessary . HypokalemiaWill replace and replace magnesium level Hydration will rehydrate and check orthostatics   Other plan as per orders.  DVT prophylaxis:    Lovenox     Code Status:  FULL CODE  as per patient    Family Communication:   Family   at  Bedside  plan of care was discussed with  Daughter   Disposition Plan:     To home once workup is complete and patient is stable   Consults called: none  Admission status:    inpatient     Level of care    tele              I have spent a total of 56 min on this admission    Caylor Tallarico 11/14/2015, 11:07 PM    [facility]  Pager [phone]   after 2 AM please page floor coverage PA If 7AM-7PM, please contact the day team taking care of the patient  [email or web address]  Password [email or web address]

## 2015-11-15 LAB — COMPREHENSIVE METABOLIC PANEL
ALT: 16 U/L (ref 14–54)
ANION GAP: 5 (ref 5–15)
AST: 24 U/L (ref 15–41)
Albumin: 3.4 g/dL — ABNORMAL LOW (ref 3.5–5.0)
Alkaline Phosphatase: 111 U/L (ref 38–126)
BILIRUBIN TOTAL: 0.5 mg/dL (ref 0.3–1.2)
CO2: 25 mmol/L (ref 22–32)
Calcium: 8.8 mg/dL — ABNORMAL LOW (ref 8.9–10.3)
Chloride: 108 mmol/L (ref 101–111)
Creatinine, Ser: 0.65 mg/dL (ref 0.44–1.00)
Glucose, Bld: 125 mg/dL — ABNORMAL HIGH (ref 65–99)
POTASSIUM: 3.4 mmol/L — AB (ref 3.5–5.1)
Sodium: 138 mmol/L (ref 135–145)
TOTAL PROTEIN: 6.1 g/dL — AB (ref 6.5–8.1)

## 2015-11-15 LAB — CBC
HCT: 38.1 % (ref 36.0–46.0)
HEMOGLOBIN: 12 g/dL (ref 12.0–15.0)
MCH: 26.4 pg (ref 26.0–34.0)
MCHC: 31.5 g/dL (ref 30.0–36.0)
MCV: 83.7 fL (ref 78.0–100.0)
Platelets: 164 10*3/uL (ref 150–400)
RBC: 4.55 MIL/uL (ref 3.87–5.11)
RDW: 14.5 % (ref 11.5–15.5)
WBC: 3.9 10*3/uL — AB (ref 4.0–10.5)

## 2015-11-15 LAB — MAGNESIUM: MAGNESIUM: 1.9 mg/dL (ref 1.7–2.4)

## 2015-11-15 LAB — TSH: TSH: 2.209 u[IU]/mL (ref 0.350–4.500)

## 2015-11-15 LAB — TROPONIN I: Troponin I: <0.03 ng/mL (ref <0.03)

## 2015-11-15 LAB — CORTISOL: CORTISOL PLASMA: 6.3 ug/dL

## 2015-11-15 LAB — PHOSPHORUS: PHOSPHORUS: 3.3 mg/dL (ref 2.5–4.6)

## 2015-11-15 MED ORDER — SODIUM CHLORIDE 0.9 % IV SOLN: INTRAVENOUS | Status: DC

## 2015-11-15 MED ORDER — POTASSIUM CHLORIDE 10 MEQ/100ML IV SOLN
INTRAVENOUS | Status: AC
  Filled 2015-11-15: qty 100

## 2015-11-15 MED ORDER — IBUPROFEN 600 MG PO TABS
600.0000 mg | ORAL_TABLET | Freq: Four times a day (QID) | ORAL | Status: AC | PRN
  Administered 2015-11-15 – 2015-11-16 (×2): 600 mg via ORAL
  Filled 2015-11-15 (×2): qty 1

## 2015-11-15 MED ORDER — PANTOPRAZOLE SODIUM 40 MG PO TBEC
40.0000 mg | DELAYED_RELEASE_TABLET | Freq: Every day | ORAL | Status: DC
  Administered 2015-11-15 – 2015-11-20 (×6): 40 mg via ORAL
  Filled 2015-11-15 (×6): qty 1

## 2015-11-15 MED ORDER — ZOLPIDEM TARTRATE 5 MG PO TABS
5.0000 mg | ORAL_TABLET | Freq: Every evening | ORAL | Status: DC | PRN
  Administered 2015-11-15: 5 mg via ORAL
  Filled 2015-11-15: qty 1

## 2015-11-15 NOTE — Progress Notes (Addendum)
PROGRESS NOTE    Gina Fritz  [id number] DOB: 06-22-1940 DOA: 11/14/2015 PCP: Stephens Shire, MD  Brief Narrative: Gina Fritz is a 75 y.o. female with medical history significant of COPD, hypertension, irritable bowel syndrome, Migraine without headaches.  Presented with headache nausea vomiting  for the past 8 days she has been seen for this in emergency department 3 times starting on 8/06- Headache seemed to come together with nausea, no diarrhea. discharged home after supportive care, Ct head and other workup was negative - She returned her symptoms resumed was taken to [place] on 08/09 Ct with mild gastric mucosal thickening, started on H2 blocker CT head and MRV negative.  At [facility] she was restarted on losartan 50 mg  -8/10 she was seen by PCP and changed to Hyzaar 50 -12.5. Her headache she attempted to use Tylenol but it did not seem to help, Her nausea and headache has persisted episodic hypertension . Again seen in ER 8/11, Rx with hydralazine 5 mg Compazine 10 mg and given 1 L of normal saline. He seemed better after IV fluids and medications CT scan of the head was done again and was negative she was discharged home with Reglan and Tylenol when necessary headaches. -Came back again on 8/12 due to ongoing HA and vomiting despite Phenergan  Assessment & Plan:   Headache/nausea and vomiting -4th ER visit in 6days for same -Multiple CT heads here and MRV at [facility] med negative -CT abd pelvis on 8/9 with possible mild gastric mucosal thickening otherwise negative -labs unremarkable, no evidence of infection, no fevers -TSH ok, check Random cortisol -tylenol, Zofran and phenergan, add Ibuprofen -add PPI, stopped H2 blocker -clears, IVF, supportive care -stop narcotics  Mild hyperglycemia -check Hbaic  COPD -stable, nebs PRN  HTN -resumed Losartan -hydralazine PRN  Hypokalemia -due to hyzaar, replaced  Depression -on lexapro, long term  DVT prophylaxis:  Lovenox Code Status:Full Code Family Communication:dtrs and spouse at bedside Disposition Plan: home when improved, ? 2-3days  Subjective: C/o headache and nausea  Objective: Vitals:   11/15/15 0800 11/15/15 0900 11/15/15 1000 11/15/15 1007  BP:    140/75  Pulse: 64 60 60 (!) 56  Resp: 19 15 15  (!) 28  Temp:      TempSrc:      SpO2: 92% 97% 96% 96%  Weight:      Height: 5\' 4"  (1.626 m)       Intake/Output Summary (Last 24 hours) at 11/15/15 1132 Last data filed at 11/15/15 1000  Gross per 24 hour  Intake              615 ml  Output                0 ml  Net              615 ml   Filed Weights   11/14/15 1510 11/15/15 0507  Weight: 52.2 kg (115 lb) 52.6 kg (115 lb 14.4 oz)    Examination:  General exam: frail, chronically ill appearing, wet towel on forehead Respiratory system: Clear to auscultation. Respiratory effort normal. Cardiovascular system: S1 & S2 heard, RRR. No JVD, murmurs, rubs, gallops or clicks. No pedal edema. Gastrointestinal system: Abdomen is nondistended, soft and nontender. No organomegaly or masses felt. Normal bowel sounds heard. Central nervous system: Alert and oriented. No focal neurological deficits. Extremities: Symmetric 5 x 5 power. Skin: No rashes, lesions or ulcers Psychiatry: Judgement and insight appear normal. Flat  affect     Data Reviewed: I have personally reviewed following labs and imaging studies  CBC:  Recent Labs Lab 11/13/15 1147 11/14/15 1710 11/15/15 1031  WBC 4.3 4.5 3.9*  NEUTROABS 2.2  --   --   HGB 14.3 13.2 12.0  HCT 43.4 39.7 38.1  MCV 82.5 80.9 83.7  PLT 221 202 [id number]   Basic Metabolic Panel:  Recent Labs Lab 11/13/15 1147 11/14/15 1710 11/14/15 2218 11/15/15 1031  NA 137 134*  --  138  K 3.5 2.8*  --  3.4*  CL 98* 97*  --  108  CO2 27 25  --  25  GLUCOSE 127* 105*  --  125*  BUN 7 5*  --  <5*  CREATININE 0.83 0.65  --  0.65  CALCIUM 9.9 9.5  --  8.8*  MG  --   --  1.7 1.9  PHOS  --   --   3.7 3.3   GFR: Estimated Creatinine Clearance: 51.2 mL/min (by C-G formula based on SCr of 0.8 mg/dL). Liver Function Tests:  Recent Labs Lab 11/13/15 1147 11/15/15 1031  AST 25 24  ALT 18 16  ALKPHOS 156* 111  BILITOT 1.2 0.5  PROT 7.8 6.1*  ALBUMIN 4.4 3.4*   No results for input(s): LIPASE, AMYLASE in the last 168 hours. No results for input(s): AMMONIA in the last 168 hours. Coagulation Profile: No results for input(s): INR, PROTIME in the last 168 hours. Cardiac Enzymes:  Recent Labs Lab 11/14/15 2218 11/15/15 0311 11/15/15 1031  TROPONINI <0.03 <0.03 <0.03   BNP (last 3 results) No results for input(s): PROBNP in the last 8760 hours. HbA1C: No results for input(s): HGBA1C in the last 72 hours. CBG: No results for input(s): GLUCAP in the last 168 hours. Lipid Profile: No results for input(s): CHOL, HDL, LDLCALC, TRIG, CHOLHDL, LDLDIRECT in the last 72 hours. Thyroid Function Tests:  Recent Labs  11/15/15 0311  TSH 2.209   Anemia Panel: No results for input(s): VITAMINB12, FOLATE, FERRITIN, TIBC, IRON, RETICCTPCT in the last 72 hours. Urine analysis:    Component Value Date/Time   COLORURINE YELLOW 11/14/2015 [place] 11/14/2015 2203   LABSPEC 1.013 11/14/2015 2203   PHURINE 7.5 11/14/2015 2203   GLUCOSEU NEGATIVE 11/14/2015 2203   HGBUR NEGATIVE 11/14/2015 2203   BILIRUBINUR NEGATIVE 11/14/2015 2203   KETONESUR NEGATIVE 11/14/2015 2203   PROTEINUR 100 (A) 11/14/2015 2203   UROBILINOGEN 1.0 02/05/2015 1339   NITRITE NEGATIVE 11/14/2015 2203   LEUKOCYTESUR NEGATIVE 11/14/2015 2203   Sepsis Labs: @LABRCNTIP (procalcitonin:4,lacticidven:4)  )No results found for this or any previous visit (from the past 240 hour(s)).       Radiology Studies: Ct Head Wo Contrast  Result Date: 11/13/2015 CLINICAL DATA:  75 year old female with a history of frontal headache and hypertension EXAM: CT HEAD WITHOUT CONTRAST TECHNIQUE: Contiguous  axial images were obtained from the base of the skull through the vertex without intravenous contrast. COMPARISON:  MRI 05/06/1998 51 FINDINGS: Unremarkable appearance of the calvarium without acute fracture or aggressive lesion. Unremarkable appearance of the scalp soft tissues. Unremarkable appearance of the bilateral orbits. Surgical changes of right maxillary antrectomy. Trace mucosal disease of the right maxillary antrum. Mastoid air cells are clear. No significant paranasal sinus disease No acute intracranial hemorrhage. No midline shift. No mass effect. Gray-white differentiation maintained. Vague hypodensity in the centrum semi of al of the left hemisphere lateral to the posterior lateral ventricle, was present on the prior MR.  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 IMPRESSION: No CT evidence of acute intracranial abnormality. Signed, Dulcy Fanny. Earleen Newport, DO [facility] Radiology Electronically Signed   By: Corrie Mckusick D.O.   On: 11/13/2015 14:43        Scheduled Meds: . aspirin EC  81 mg Oral Daily  . atorvastatin  40 mg Oral q1800  . enoxaparin (LOVENOX) injection  40 mg Subcutaneous Q24H  . escitalopram  20 mg Oral Daily  . famotidine  40 mg Oral Daily  . feeding supplement  1 Container Oral TID BM  . fluticasone  2 spray Each Nare Daily  . hydrALAZINE  5 mg Intravenous Once  . losartan  100 mg Oral Daily  . mometasone-formoterol  2 puff Inhalation BID  . montelukast  10 mg Oral QHS  . pantoprazole  40 mg Oral Daily  . potassium chloride      . sodium chloride flush  3 mL Intravenous Q12H  . sucralfate  1 g Oral QID   Continuous Infusions: . sodium chloride 75 mL/hr at 11/15/15 1030     LOS: 1 day    Time spent: 25min    Domenic Polite, MD [facility] Pager [phone]  If 7PM-7AM, please contact night-coverage [email or web address] Password [facility] 11/15/2015, 11:32 AM

## 2015-11-15 NOTE — Progress Notes (Signed)
PT Cancellation Note  Patient Details Name: Gina Fritz MRN: [id number] DOB: 20-Nov-1940   Cancelled Treatment:    Reason Eval/Treat Not Completed: Other (comment).  Per RN pt finally resting and requests that PT hold off for now.  Will attempt to see pt again later today, schedule permitting.  [name] PT, DPT  Pager: [phone] Phone: [phone] 11/15/2015, 9:47 AM

## 2015-11-16 ENCOUNTER — Encounter (HOSPITAL_COMMUNITY): Payer: Self-pay | Admitting: Radiology

## 2015-11-16 ENCOUNTER — Inpatient Hospital Stay (HOSPITAL_COMMUNITY): Payer: Medicare Other

## 2015-11-16 DIAGNOSIS — R51 Headache: Secondary | ICD-10-CM

## 2015-11-16 LAB — CBC
HEMATOCRIT: 37.7 % (ref 36.0–46.0)
HEMOGLOBIN: 11.7 g/dL — AB (ref 12.0–15.0)
MCH: 25.9 pg — ABNORMAL LOW (ref 26.0–34.0)
MCHC: 31 g/dL (ref 30.0–36.0)
MCV: 83.6 fL (ref 78.0–100.0)
Platelets: 177 10*3/uL (ref 150–400)
RBC: 4.51 MIL/uL (ref 3.87–5.11)
RDW: 14.6 % (ref 11.5–15.5)
WBC: 4.3 10*3/uL (ref 4.0–10.5)

## 2015-11-16 LAB — COMPREHENSIVE METABOLIC PANEL
ALBUMIN: 3.2 g/dL — AB (ref 3.5–5.0)
ALK PHOS: 112 U/L (ref 38–126)
ALT: 18 U/L (ref 14–54)
ANION GAP: 6 (ref 5–15)
AST: 24 U/L (ref 15–41)
BUN: 8 mg/dL (ref 6–20)
CALCIUM: 9 mg/dL (ref 8.9–10.3)
CO2: 28 mmol/L (ref 22–32)
CREATININE: 0.62 mg/dL (ref 0.44–1.00)
Chloride: 106 mmol/L (ref 101–111)
GFR calc Af Amer: >60 mL/min (ref >60)
GFR calc non Af Amer: >60 mL/min (ref >60)
GLUCOSE: 102 mg/dL — AB (ref 65–99)
Potassium: 3.3 mmol/L — ABNORMAL LOW (ref 3.5–5.1)
Sodium: 140 mmol/L (ref 135–145)
TOTAL PROTEIN: 6 g/dL — AB (ref 6.5–8.1)
Total Bilirubin: 0.7 mg/dL (ref 0.3–1.2)

## 2015-11-16 LAB — SEDIMENTATION RATE: Sed Rate: 30 mm/hr — ABNORMAL HIGH (ref 0–22)

## 2015-11-16 LAB — C-REACTIVE PROTEIN: CRP: 0.5 mg/dL (ref <1.0)

## 2015-11-16 MED ORDER — SODIUM CHLORIDE 0.9 % IV SOLN
INTRAVENOUS | Status: DC
  Administered 2015-11-16 – 2015-11-18 (×5): via INTRAVENOUS
  Filled 2015-11-16 (×7): qty 1000

## 2015-11-16 MED ORDER — IBUPROFEN 600 MG PO TABS
600.0000 mg | ORAL_TABLET | Freq: Four times a day (QID) | ORAL | Status: AC | PRN
  Administered 2015-11-16 – 2015-11-17 (×2): 600 mg via ORAL
  Filled 2015-11-16 (×3): qty 1

## 2015-11-16 MED ORDER — KETOROLAC TROMETHAMINE 30 MG/ML IJ SOLN
30.0000 mg | Freq: Once | INTRAMUSCULAR | Status: AC
  Administered 2015-11-16: 30 mg via INTRAVENOUS
  Filled 2015-11-16: qty 1

## 2015-11-16 MED ORDER — DIPHENHYDRAMINE HCL 50 MG/ML IJ SOLN
12.5000 mg | Freq: Once | INTRAMUSCULAR | Status: AC
  Administered 2015-11-16: 12.5 mg via INTRAVENOUS
  Filled 2015-11-16: qty 1

## 2015-11-16 MED ORDER — PROCHLORPERAZINE EDISYLATE 5 MG/ML IJ SOLN
10.0000 mg | Freq: Once | INTRAMUSCULAR | Status: AC
  Administered 2015-11-16: 10 mg via INTRAVENOUS
  Filled 2015-11-16: qty 2

## 2015-11-16 MED ORDER — ZOLPIDEM TARTRATE 5 MG PO TABS
5.0000 mg | ORAL_TABLET | Freq: Every evening | ORAL | Status: DC | PRN
  Administered 2015-11-17: 5 mg via ORAL
  Filled 2015-11-16 (×2): qty 1

## 2015-11-16 MED ORDER — COSYNTROPIN 0.25 MG IJ SOLR
0.2500 mg | Freq: Once | INTRAMUSCULAR | Status: AC
  Administered 2015-11-17: 0.25 mg via INTRAVENOUS
  Filled 2015-11-16: qty 0.25

## 2015-11-16 MED ORDER — LABETALOL HCL 5 MG/ML IV SOLN
10.0000 mg | INTRAVENOUS | Status: DC | PRN
  Administered 2015-11-16: 10 mg via INTRAVENOUS
  Filled 2015-11-16: qty 4

## 2015-11-16 MED ORDER — AMLODIPINE BESYLATE 10 MG PO TABS
10.0000 mg | ORAL_TABLET | Freq: Every day | ORAL | Status: DC
  Administered 2015-11-16 – 2015-11-20 (×5): 10 mg via ORAL
  Filled 2015-11-16 (×5): qty 1

## 2015-11-16 MED ORDER — IOPAMIDOL (ISOVUE-370) INJECTION 76%
INTRAVENOUS | Status: AC
  Administered 2015-11-16: 50 mL
  Filled 2015-11-16: qty 50

## 2015-11-16 NOTE — Progress Notes (Signed)
Initial Nutrition Assessment  DOCUMENTATION CODES:   Severe malnutrition in context of acute illness/injury  INTERVENTION:  Boost Breeze po TID, each supplement provides 250 kcal and 9 grams of protein  Snacks (pudding) TID between meals to increase intake.   NUTRITION DIAGNOSIS:   Malnutrition (Severe) related to nausea, vomiting, poor appetite, acute illness as evidenced by moderate depletions of muscle mass, moderate depletion of body fat, energy intake < or equal to 50% for > or equal to 5 days.  GOAL:   Patient will meet greater than or equal to 90% of their needs  MONITOR:   PO intake, Supplement acceptance, Diet advancement  REASON FOR ASSESSMENT:   Malnutrition Screening Tool    ASSESSMENT:   Patient is a 75 y/o female with hx of HTN, HLD, dizziness, depression, COPD, CAP and anxiety presents with nausea, vomiting and headaches. Has been to the ER 4 times in the last month for same symptoms. CT abd pelvis on 8/9 with possible mild gastric mucosal thickening otherwise negative. Plan to d/c home with health services when appropriate.   Assessment completed with patient and daughter. Patient reports she hasn't been eating much food due to decreased appetite, absence of hunger for approximately 10 days. She reports taking a few bites of eggs in the morning, a few bites of a snack in the afternoon, and small amounts of dinner in the evening. She does have Ensure at home, and has previously been told to drink 1 per day, but had not been drinking them regularly. Although she is on a clear liquid diet, she has had 1 slice [employer] pizza last night, a few bites of eggs this morning, and has been drinking Ensure instead of [employer]. Pt normally uses dentures, but does not have them here, further contributing to difficulty eating. Pt reports UBW was previously 118 lbs. Over a two year period she had lost weight to 95 lbs up until 02/2015 when she was hospitalized with PNA  (dx with moderate malnutrition at this time). Her weight had been increasing to 110-115 lbs, but pt and daughter believe she might have lost more during past 10 days. No weight loss evident in chart. Patient open to having smaller, more frequent meals during the day and continuing supplement.  Labs reviewed: Potassium 3.3 trending up with repletion, Glucose 102. Medications reviewed and include: cosyntropin 0.25 mg, pantoprazole, zofran, 40 mEq potassium chloride in 1 L NS.   Nutrition-Focused physical exam completed. Findings are moderate fat depletion, moderate muscle depletion, and no edema.   Discussed plan with RN.  Diet Order:  Diet clear liquid Room service appropriate? Yes; Fluid consistency: Thin  Skin:  Reviewed, no issues  Last BM:  11/15/2015  Height:   Ht Readings from Last 1 Encounters:  11/15/15 5\' 4"  (1.626 m)    Weight:   Wt Readings from Last 1 Encounters:  11/16/15 119 lb (54 kg)    Ideal Body Weight:  54.5 kg  BMI:  Body mass index is 20.43 kg/m.  Estimated Nutritional Needs:   Kcal:  1500-1700  Protein:  70-80 grams  Fluid:  > 1600 ml  EDUCATION NEEDS:   Education needs addressed (Emphasis on adequate protein and calories. Provided ideas for calorie- and protein-dense foods. Small, frequent meals to increase intake. )  Willey Blade, MS, RD, LDN

## 2015-11-16 NOTE — Progress Notes (Signed)
Orders received. Gave 10mg  IV labetalol and 10mg  PO of amlodipine. BP now 159/83   7:03 PM  11/16/15  Keondrick Dilks Rica Mote, RN

## 2015-11-16 NOTE — Evaluation (Signed)
Physical Therapy Evaluation Patient Details Name: Gina Fritz MRN: [id number] DOB: 03-18-1941 Today's Date: 11/16/2015   History of Present Illness  Patient is a 75 y/o female with hx of HTN, HLD, dizziness, depression, COPD, CAP and anxiety presents with nausea, vomiting and headaches. Has been to the ER 4 times in the last month for same symptoms. CT abd pelvis on 8/9 with possible mild gastric mucosal thickening otherwise negative. Workup pending.  Clinical Impression  Patient presents with generalized weakness, deconditioning, chest pain and impaired tolerance for activity s/p above impacting mobility. Ambulation and mobility limited due to "feeling weak all over" and chest pain that resolved after rest. BP prior to mobility 165/101 and dropped to 146/67 post ambulation. Encouraged OOB to chair and mobility while in hospital to improve strength. Pt has support from spouse and daughter at discharge. Will follow acutely to maximize independence and mobility prior to return home.    Follow Up Recommendations Home health PT;Supervision for mobility/OOB    Equipment Recommendations  None recommended by PT    Recommendations for Other Services OT consult     Precautions / Restrictions Precautions Precautions: Fall Precaution Comments: elevated BP Restrictions Weight Bearing Restrictions: No      Mobility  Bed Mobility Overal bed mobility: Needs Assistance Bed Mobility: Sit to Supine       Sit to supine: Modified independent (Device/Increase time)   General bed mobility comments: No assist needed to return to supine.  Transfers Overall transfer level: Needs assistance Equipment used: None Transfers: Sit to/from Stand Sit to Stand: Min guard         General transfer comment: Min guard for safety. Stood from [facility] x1.  Ambulation/Gait Ambulation/Gait assistance: Min assist Ambulation Distance (Feet): 75 Feet Assistive device: None Gait Pattern/deviations: Step-through  pattern;Decreased stride length;Shuffle;Trunk flexed;Wide base of support Gait velocity: decreased Gait velocity interpretation: <1.8 ft/sec, indicative of risk for recurrent falls General Gait Details: Slow, shuffling like gait pattern. Long standing rest break- reporting feeling weak all over. + chest pain. Pt sat in w/c. Reported CP improved.   Stairs            Wheelchair Mobility    Modified Rankin (Stroke Patients Only)       Balance Overall balance assessment: Needs assistance Sitting-balance support: Feet supported;No upper extremity supported Sitting balance-Leahy Scale: Good     Standing balance support: During functional activity Standing balance-Leahy Scale: Fair                               Pertinent Vitals/Pain Pain Assessment: No/denies pain    Home Living Family/patient expects to be discharged to:: Private residence Living Arrangements: Spouse/significant other Available Help at Discharge: Family;Available 24 hours/day Type of Home: House Home Access: Stairs to enter Entrance Stairs-Rails: Right Entrance Stairs-Number of Steps: 4 Home Layout: One level Home Equipment: None      Prior Function Level of Independence: Independent         Comments: Recently finished cardiopulmonary rehab and now is on the maintenance program. Reports laying in bed for the last 8 days due to not feeling well.      Hand Dominance        Extremity/Trunk Assessment   Upper Extremity Assessment: Defer to OT evaluation           Lower Extremity Assessment: Generalized weakness         Communication   Communication: No difficulties  Cognition Arousal/Alertness: Awake/alert Behavior During Therapy: WFL for tasks assessed/performed Overall Cognitive Status: Within Functional Limits for tasks assessed (seems [facility])                      General Comments General comments (skin integrity, edema, etc.): Daughter present in room during  session.    Exercises        Assessment/Plan    PT Assessment Patient needs continued PT services  PT Diagnosis Difficulty walking;Generalized weakness   PT Problem List Decreased strength;Decreased mobility;Decreased balance;Pain;Cardiopulmonary status limiting activity;Decreased activity tolerance  PT Treatment Interventions DME instruction;Therapeutic activities;Therapeutic exercise;Gait training;Patient/family education;Balance training;Functional mobility training;Stair training   PT Goals (Current goals can be found in the Care Plan section) Acute Rehab PT Goals Patient Stated Goal: to not go home until I feel better PT Goal Formulation: With patient Time For Goal Achievement: 11/30/15 Potential to Achieve Goals: Fair    Frequency Min 3X/week   Barriers to discharge        Co-evaluation               End of Session Equipment Utilized During Treatment: Gait belt Activity Tolerance: Treatment limited secondary to medical complications (Comment) (chest pain, weakness.) Patient left: in bed;with call bell/phone within reach;with family/visitor present Nurse Communication: Mobility status         Time: [id number] PT Time Calculation (min) (ACUTE ONLY): 26 min   Charges:   PT Evaluation $PT Eval Moderate Complexity: 1 Procedure PT Treatments $Gait Training: 8-22 mins   PT G Codes:        Melaina Howerton A Dinna Severs 11/16/2015, 10:39 AM  Wray Kearns, [place], DPT [phone]

## 2015-11-16 NOTE — Progress Notes (Signed)
Pt request to return to regular diet tomorrow 8/15. Pt is tolerating full liquid diet well. Had some nausea earlier this morning and was relieved by Zofran.  3:25 PM  11/16/15  Sherrelle Prochazka Rica Mote, RN

## 2015-11-16 NOTE — Care Management Note (Signed)
Case Management Note  Patient Details  Name: Gina Fritz MRN: [id number] Date of Birth: 1940-08-10  Subjective/Objective:     Patient is from home with spouse, but he is not able to assist her with anything,he is in a w/chair.  Patient has pcp Dr. Marlyn Corporal at [facility], has Medicare with supplement coverage for medications BC[employer].  Per Pt eval rec HHPT, Patient chose [facility], referral made to [place] with [facility] for [place], [place], [place]. .  Pateint states she will need a bsc as well which will get through [facility].  NCM will cont to follow for dc needs.                  Action/Plan:   Expected Discharge Date:                  Expected Discharge Plan:  [place]  In-House Referral:     Discharge planning Services  CM Consult  Post Acute Care Choice:    Choice offered to:  Patient  DME Arranged:  Bedside commode DME Agency:  [place]:  RN, PT, Nurse's Aide [facility] Agency:  [place]  Status of Service:  In process, will continue to follow  If discussed at Long Length of Stay Meetings, dates discussed:    Additional Comments:  [name], RN 11/16/2015, 11:37 AM

## 2015-11-16 NOTE — Progress Notes (Signed)
Pt BP 189/99. 10 mg hydralizine given. 30 mins later BP 196/91. MD made aware.  6:47 PM  11/16/15  [name], RN

## 2015-11-16 NOTE — Consult Note (Signed)
NEURO HOSPITALIST CONSULT NOTE   Requestig physician: Dr. Broadus John   Reason for Consult: chronic HA   History obtained from:  Patient    HPI:                                                                                                                                          Gina Fritz is an 75 y.o. female who has been experiencing headache nausea +/-vomiting once or  Twice for the past 8 days she was been seen in emergency department on the 8/06 where it was noted she was having a hard time tolerating her BP medications--causing her to be hypotensive.   Due to continued HA and continued N/V she was seen at [facility] on 08/09 diagnosed with Gastritis. CT abdomen was negative, CT head and MRV negative, ED "Called the PCPs office to determine what medication she used to take for blood pressure, she had been on losartan 50 mg daily. She discontinued this medicine after a recent bout of pneumonia and subsequent low blood pressure. Patient was given instructions to take the antihypertensive the next day". At [facility] she was started on losartan 50 mg on 8/10 she was seen by PCP and changed to Hyzaar 50 -12.5. Her headache she attempted to use Tylenol but it did not seem to help.  Due to N/V and frontal HA persisting she came to [place] 8/11 where she received Hydralazine 5 mg Compazine 10 mg and given 1 L of normal saline. SHe seemed to improvebetter after IV fluids and medications CT scan of the head was done again and was negative she was discharged home with Reglan and Tylenol when necessary headaches. She returned to the ED on 8/12 due to continued symptoms. She has been tried on the medications below with no significant help.   Her daughter she has had no real vomiting. Her headache seemed to worsen when her blood pressure increases. Currently she states she is a 2/10. Headache is constant and in the frontal region of her head. She denies any blurred vision, numbness or  weakness. Both patient and daughter states she has not a chronic user of NSAIDs. Daughters and patient have noted the blood pressure has not been well controlled. When the blood pressure is controlled her headaches seemed to subside.   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 Flow is present within the structures. No evidence of hemorrhage. The veins drain into the superior sagittal sinus. Superior sagittal sinus is patent Normal  internal cerebral veins, vein of Galen, and straight sinus. Both transverse sinuses are patent. Normal sigmoid sinuses and proximal internal jugular veins. No intraluminal filling defect   Past Medical History:  Diagnosis Date  . Allergic rhinitis   . Anxiety   . CAP (community acquired pneumonia)    dx 02-05-2015  . Colitis    dx 02-05-2015  . COPD with emphysema ([place])   . Depression   . Dizziness   . GERD (gastroesophageal reflux disease)   . History of chronic sinusitis   . History of kidney stones   . Hyperlipidemia   . Hypertension   . Irritable bowel syndrome   . Loss of weight   . Pulmonary nodules    benign and stable per CT  . Rash   . Right ureteral stone   . Sensorineural hearing loss, unilateral   . Subjective tinnitus     Past Surgical History:  Procedure Laterality Date  . CATARACT EXTRACTION Left   . CYSTO/  LEFT URETEROSCOPIC STONE EXTRACTION/ STENT PLACEMENT  08-10-2009  . ECTOPIC PREGNANCY SURGERY     and Appendectomy  . NASAL SINUS SURGERY    . REMOVAL CYST RIGHT ARM  age 22  . TUBAL LIGATION      Family History  Problem Relation Age of Onset  . Congestive Heart Failure Mother   . Hearing loss Mother   . Stroke  Mother     syndrome  . Hypertension Father   . Other      thyroid disorder     Social History:  reports that she quit smoking about 9 months ago. Her smoking use included Cigarettes. She has a 25.00 pack-year smoking history. She has never used smokeless tobacco. She reports that she drinks about 0.6 oz of alcohol per week . She reports that she does not use drugs.  Allergies  Allergen Reactions  . Penicillins Anaphylaxis    Has patient had a PCN reaction causing immediate rash, facial/tongue/throat swelling, SOB or lightheadedness with hypotension: Yes Has patient had a PCN reaction causing severe rash involving mucus membranes or skin necrosis: Yes Has patient had a PCN reaction that required hospitalization Yes Has patient had a PCN reaction occurring within the last 10 years: No If all of the above answers are "NO", then may proceed with Cephalosporin use.   . Sulfa Antibiotics Hives  . Soy Allergy Itching and Rash    MEDICATIONS:                                                                                                                     Prior to Admission:  Prescriptions Prior to Admission  Medication Sig Dispense Refill Last Dose  . albuterol (PROAIR HFA) 108 (90 Base) MCG/ACT inhaler Inhale 2 puffs into the lungs every 6 (six) hours as needed for wheezing or shortness of breath. 1 Inhaler 2 Past Week at Unknown time  . aspirin EC 81 MG tablet Take 81 mg by mouth daily.   11/14/2015 at  Unknown time  . atorvastatin (LIPITOR) 40 MG tablet Take 40 mg by mouth daily.   11/14/2015 at Unknown time  . BESIVANCE 0.6 % SUSP Place 1 drop into the right eye 4 (four) times daily. The day before and the day of the dr visit   11/14/2015 at Unknown time  . Cholecalciferol (VITAMIN D) 2000 UNITS CAPS Take 2,000 Units by mouth daily.   11/14/2015 at Unknown time  . DONNATAL 16.2 MG tablet Take 16.2 tablets by mouth daily as needed (ibs).    Past Month at Unknown time  . escitalopram  (LEXAPRO) 20 MG tablet Take 20 mg by mouth daily.    11/14/2015 at Unknown time  . famotidine (PEPCID) 40 MG tablet Take 40 mg by mouth daily.   11/14/2015 at Unknown time  . fluticasone (FLONASE) 50 MCG/ACT nasal spray Place 2 sprays into the nose daily.   11/14/2015 at Unknown time  . loperamide (IMODIUM) 2 MG capsule Take 2-4 mg by mouth 4 (four) times daily as needed for diarrhea or loose stools. For upset stomach   11/14/2015 at Unknown time  . losartan (COZAAR) 50 MG tablet Take 50 mg by mouth daily.   11/14/2015 at Unknown time  . losartan-hydrochlorothiazide (HYZAAR) 50-12.5 MG tablet Take 1 tablet by mouth daily.   11/14/2015 at Unknown time  . metoCLOPramide (REGLAN) 10 MG tablet Take 1 tablet (10 mg total) by mouth every 6 (six) hours. 30 tablet 0 11/13/2015 at Unknown time  . montelukast (SINGULAIR) 10 MG tablet Take 10 mg by mouth daily.    11/14/2015 at Unknown time  . Multiple Vitamin (MULTIVITAMIN WITH MINERALS) TABS Take 1 tablet by mouth daily.   11/14/2015 at Unknown time  . Multiple Vitamins-Minerals (ICAPS AREDS 2 PO) Take 1 tablet by mouth 2 (two) times daily.   11/14/2015 at Unknown time  . polyethylene glycol powder (GLYCOLAX/MIRALAX) powder Take 1 Container by mouth once.   11/14/2015 at Unknown time  . promethazine (PHENERGAN) 12.5 MG suppository Place 12.5 mg rectally every 6 (six) hours as needed for nausea or vomiting.   11/14/2015 at Unknown time  . ranitidine (ZANTAC) 300 MG tablet Take 300 mg by mouth 2 (two) times daily.   11/14/2015 at Unknown time  . solifenacin (VESICARE) 10 MG tablet Take 10 mg by mouth daily.    11/14/2015 at Unknown time  . sucralfate (CARAFATE) 1 GM/10ML suspension Take 1 g by mouth 4 (four) times daily.   11/14/2015 at Unknown time  . acetaminophen (TYLENOL) 500 MG tablet Take 1,000 mg by mouth every 6 (six) hours as needed for moderate pain or headache.   11/12/2015 at Unknown time  . mometasone-formoterol (DULERA) 100-5 MCG/ACT AERO Inhale 2 puffs into the  lungs 2 (two) times daily. (Patient not taking: Reported on 11/13/2015) 1 Inhaler 5 Not Taking at Unknown time  . tamsulosin (FLOMAX) 0.4 MG CAPS capsule Take 1 capsule (0.4 mg total) by mouth daily after breakfast. (Patient not taking: Reported on 07/23/2015) 30 capsule 2 Not Taking at Unknown time   Scheduled: . atorvastatin  40 mg Oral q1800  . [START ON 11/17/2015] cosyntropin  0.25 mg Intravenous Once  . enoxaparin (LOVENOX) injection  40 mg Subcutaneous Q24H  . escitalopram  20 mg Oral Daily  . feeding supplement  1 Container Oral TID BM  . fluticasone  2 spray Each Nare Daily  . hydrALAZINE  5 mg Intravenous Once  . losartan  100 mg Oral Daily  . mometasone-formoterol  2  puff Inhalation BID  . montelukast  10 mg Oral QHS  . pantoprazole  40 mg Oral Daily  . sodium chloride flush  3 mL Intravenous Q12H     ROS:                                                                                                                                       History obtained from the patient  General ROS: negative for - chills, fatigue, fever, night sweats, weight gain or weight loss Psychological ROS: negative for - behavioral disorder, hallucinations, memory difficulties, mood swings or suicidal ideation Ophthalmic ROS: negative for - blurry vision, double vision, eye pain or loss of vision ENT ROS: negative for - epistaxis, nasal discharge, oral lesions, sore throat, tinnitus or vertigo Allergy and Immunology ROS: negative for - hives or itchy/watery eyes Hematological and Lymphatic ROS: negative for - bleeding problems, bruising or swollen lymph nodes Endocrine ROS: negative for - galactorrhea, hair pattern changes, polydipsia/polyuria or temperature intolerance Respiratory ROS: negative for - cough, hemoptysis, shortness of breath or wheezing Cardiovascular ROS: negative for - chest pain, dyspnea on exertion, edema or irregular heartbeat Gastrointestinal ROS: negative for - abdominal pain,  diarrhea, hematemesis, nausea/vomiting or stool incontinence Genito-Urinary ROS: negative for - dysuria, hematuria, incontinence or urinary frequency/urgency Musculoskeletal ROS: negative for - joint swelling or muscular weakness Neurological ROS: as noted in HPI Dermatological ROS: negative for rash and skin lesion changes   Blood pressure (!) 111/57, pulse 74, temperature 98 F (36.7 C), temperature source Oral, resp. rate 20, height 5\' 4"  (1.626 m), weight 54 kg (119 lb), SpO2 97 %.   Neurologic Examination:                                                                                                      HEENT-  Normocephalic, no lesions, without obvious abnormality.  Normal external eye and conjunctiva.  Normal TM's bilaterally.  Normal auditory canals and external ears. Normal external nose, mucus membranes and septum.  Normal pharynx. Cardiovascular- S1, S2 normal, pulses palpable throughout   Lungs- chest clear, no wheezing, rales, normal symmetric air entry Abdomen- normal findings: bowel sounds normal Extremities- no edema Lymph-no adenopathy palpable Musculoskeletal-no joint tenderness, deformity or swelling Skin-warm and dry, no hyperpigmentation, vitiligo, or suspicious lesions  Neurological Examination Mental Status: Alert, oriented, thought content appropriate.  Speech fluent without evidence of aphasia.  Able to follow 3 step commands without difficulty. Cranial Nerves: II:  Visual fields grossly normal,  pupils equal, round, reactive to light and accommodation III,IV, VI: ptosis not present, extra-ocular motions intact bilaterally V,VII: smile symmetric, facial light touch sensation normal bilaterally VIII: hearing normal bilaterally IX,X: uvula rises symmetrically XI: bilateral shoulder shrug XII: midline tongue extension Motor: Right : Upper extremity   5/5    Left:     Upper extremity   5/5  Lower extremity   5/5     Lower extremity   5/5 Tone and bulk:normal  tone throughout; no atrophy noted Sensory: Pinprick and light touch intact throughout, bilaterally Deep Tendon Reflexes: 2+ and symmetric throughout Plantars: Right: downgoing   Left: downgoing Cerebellar: normal finger-to-nose, nand normal heel-to-shin test Gait: not tested      Lab Results: Basic Metabolic Panel:  Recent Labs Lab 11/13/15 1147 11/14/15 1710 11/14/15 2218 11/15/15 1031 11/16/15 0442  NA 137 134*  --  138 140  K 3.5 2.8*  --  3.4* 3.3*  CL 98* 97*  --  108 106  CO2 27 25  --  25 28  GLUCOSE 127* 105*  --  125* 102*  BUN 7 5*  --  <5* 8  CREATININE 0.83 0.65  --  0.65 0.62  CALCIUM 9.9 9.5  --  8.8* 9.0  MG  --   --  1.7 1.9  --   PHOS  --   --  3.7 3.3  --     Liver Function Tests:  Recent Labs Lab 11/13/15 1147 11/15/15 1031 11/16/15 0442  AST 25 24 24   ALT 18 16 18   ALKPHOS 156* 111 112  BILITOT 1.2 0.5 0.7  PROT 7.8 6.1* 6.0*  ALBUMIN 4.4 3.4* 3.2*   No results for input(s): LIPASE, AMYLASE in the last 168 hours. No results for input(s): AMMONIA in the last 168 hours.  CBC:  Recent Labs Lab 11/13/15 1147 11/14/15 1710 11/15/15 1031 11/16/15 0442  WBC 4.3 4.5 3.9* 4.3  NEUTROABS 2.2  --   --   --   HGB 14.3 13.2 12.0 11.7*  HCT 43.4 39.7 38.1 37.7  MCV 82.5 80.9 83.7 83.6  PLT 221 202 164 177    Cardiac Enzymes:  Recent Labs Lab 11/14/15 2218 11/15/15 0311 11/15/15 1031  TROPONINI <0.03 <0.03 <0.03    Lipid Panel: No results for input(s): CHOL, TRIG, HDL, CHOLHDL, VLDL, LDLCALC in the last 168 hours.  CBG: No results for input(s): GLUCAP in the last 168 hours.  Microbiology: Results for orders placed or performed during the hospital encounter of 02/05/15  Urine culture     Status: None   Collection Time: 02/05/15  1:39 PM  Result Value Ref Range Status   Specimen Description URINE, CLEAN CATCH  Final   Special Requests NONE  Final   Culture   Final    60,000 COLONIES/ml DIPHTHEROIDS(CORYNEBACTERIUM  SPECIES) Performed at [facility]    Report Status 02/07/2015 FINAL  Final  C difficile quick scan w PCR reflex     Status: None   Collection Time: 02/08/15  7:00 AM  Result Value Ref Range Status   C Diff antigen NEGATIVE NEGATIVE Final   C Diff toxin NEGATIVE NEGATIVE Final   C Diff interpretation Negative for toxigenic C. difficile  Final    Coagulation Studies: No results for input(s): LABPROT, INR in the last 72 hours.  Imaging: No results found.     Assessment and plan per attending neurologist  Etta Quill PA-C [facility] [phone]  11/16/2015, 2:18 PM   Assessment/Plan:  75 year old female with ongoing bilateral frontal headaches that appear to worsen with hypertension.

## 2015-11-16 NOTE — Progress Notes (Addendum)
PROGRESS NOTE    Gina Fritz  [id number] DOB: Mar 06, 1941 DOA: 11/14/2015 PCP: Stephens Shire, MD  Brief Narrative: Gina Fritz is a 75 y.o. female with medical history significant of COPD, hypertension, irritable bowel syndrome, Migraine without headaches.  Presented with headache nausea vomiting  for the past 8 days she has been seen for this in emergency department 3 times starting on 8/06- Headache seemed to come together with nausea, no diarrhea. discharged home after supportive care, Ct head and other workup was negative - She returned her symptoms resumed was taken to [place] on 08/09 Ct with mild gastric mucosal thickening, started on H2 blocker CT head and MRV negative.  At [facility] she was restarted on losartan 50 mg  -8/10 she was seen by PCP and changed to Hyzaar 50 -12.5. Her headache she attempted to use Tylenol but it did not seem to help, Her nausea and headache has persisted episodic hypertension . Again seen in ER 8/11, Rx with hydralazine 5 mg Compazine 10 mg and given 1 L of normal saline. He seemed better after IV fluids and medications CT scan of the head was done again and was negative she was discharged home with Reglan and Tylenol when necessary headaches. -Came back again on 8/12 due to ongoing HA and vomiting despite Phenergan. Workup negative so far, suspect Status migranous, NEuro consulted, MRI ordered  Assessment & Plan:   Headache/nausea -4th ER visit in 6days for same -Multiple CT heads here and MRV at [facility] med negative -CT abd pelvis on 8/9 with possible mild gastric mucosal thickening otherwise negative -labs unremarkable, no evidence of infection, no fevers -TSH ok, Random cortisol low, ordered co-syntropin stim test-for tomorrow am -Tylenol PRN, toradol/compazine/benadryl added per Neuro, d/w Dr.Kirkpatrick -MRI brain ordered -clears as tolerated, IVF, supportive care -continue PPI  Mild hyperglycemia -Hbaic pending  COPD -stable, nebs  PRN  HTN -continue Losartan,  -hydralazine PRN  Hypokalemia -due to hyzaar, replaced  Depression -on lexapro, long term  DVT prophylaxis: Lovenox Code Status:Full Code Family Communication:daughter  at bedside Disposition Plan: home when improved, ? 2days  Subjective: C/o headache and nausea, felt better last pm, now worse  Objective: Vitals:   11/16/15 1200 11/16/15 1300 11/16/15 1400 11/16/15 1405  BP:      Pulse: 81 70 71 74  Resp: 15 20 (!) 23 20  Temp:    98 F (36.7 C)  TempSrc:    Oral  SpO2: 96% 96% 98% 97%  Weight:      Height:        Intake/Output Summary (Last 24 hours) at 11/16/15 1450 Last data filed at 11/16/15 1400  Gross per 24 hour  Intake              685 ml  Output              850 ml  Net             -165 ml   Filed Weights   11/14/15 1510 11/15/15 0507 11/16/15 0457  Weight: 52.2 kg (115 lb) 52.6 kg (115 lb 14.4 oz) 54 kg (119 lb)    Examination:  General exam: frail, chronically ill appearing, wet towel on forehead Respiratory system: Clear to auscultation. Respiratory effort normal. Cardiovascular system: S1 & S2 heard, RRR. No JVD, murmurs, rubs, gallops or clicks. No pedal edema. Gastrointestinal system: Abdomen is nondistended, soft and nontender. No organomegaly or masses felt. Normal bowel sounds heard. Central nervous system: Alert  and oriented. No focal neurological deficits. Extremities: Symmetric 5 x 5 power. Skin: No rashes, lesions or ulcers Psychiatry: Judgement and insight appear normal. Flat affect     Data Reviewed: I have personally reviewed following labs and imaging studies  CBC:  Recent Labs Lab 11/13/15 1147 11/14/15 1710 11/15/15 1031 11/16/15 0442  WBC 4.3 4.5 3.9* 4.3  NEUTROABS 2.2  --   --   --   HGB 14.3 13.2 12.0 11.7*  HCT 43.4 39.7 38.1 37.7  MCV 82.5 80.9 83.7 83.6  PLT 221 202 164 [id number]   Basic Metabolic Panel:  Recent Labs Lab 11/13/15 1147 11/14/15 1710 11/14/15 2218 11/15/15 1031  11/16/15 0442  NA 137 134*  --  138 140  K 3.5 2.8*  --  3.4* 3.3*  CL 98* 97*  --  108 106  CO2 27 25  --  25 28  GLUCOSE 127* 105*  --  125* 102*  BUN 7 5*  --  <5* 8  CREATININE 0.83 0.65  --  0.65 0.62  CALCIUM 9.9 9.5  --  8.8* 9.0  MG  --   --  1.7 1.9  --   PHOS  --   --  3.7 3.3  --    GFR: Estimated Creatinine Clearance: 52.6 mL/min (by C-G formula based on SCr of 0.8 mg/dL). Liver Function Tests:  Recent Labs Lab 11/13/15 1147 11/15/15 1031 11/16/15 0442  AST 25 24 24   ALT 18 16 18   ALKPHOS 156* 111 112  BILITOT 1.2 0.5 0.7  PROT 7.8 6.1* 6.0*  ALBUMIN 4.4 3.4* 3.2*   No results for input(s): LIPASE, AMYLASE in the last 168 hours. No results for input(s): AMMONIA in the last 168 hours. Coagulation Profile: No results for input(s): INR, PROTIME in the last 168 hours. Cardiac Enzymes:  Recent Labs Lab 11/14/15 2218 11/15/15 0311 11/15/15 1031  TROPONINI <0.03 <0.03 <0.03   BNP (last 3 results) No results for input(s): PROBNP in the last 8760 hours. HbA1C: No results for input(s): HGBA1C in the last 72 hours. CBG: No results for input(s): GLUCAP in the last 168 hours. Lipid Profile: No results for input(s): CHOL, HDL, LDLCALC, TRIG, CHOLHDL, LDLDIRECT in the last 72 hours. Thyroid Function Tests:  Recent Labs  11/15/15 0311  TSH 2.209   Anemia Panel: No results for input(s): VITAMINB12, FOLATE, FERRITIN, TIBC, IRON, RETICCTPCT in the last 72 hours. Urine analysis:    Component Value Date/Time   COLORURINE YELLOW 11/14/2015 [place] 11/14/2015 2203   LABSPEC 1.013 11/14/2015 2203   PHURINE 7.5 11/14/2015 2203   GLUCOSEU NEGATIVE 11/14/2015 2203   HGBUR NEGATIVE 11/14/2015 2203   BILIRUBINUR NEGATIVE 11/14/2015 2203   KETONESUR NEGATIVE 11/14/2015 2203   PROTEINUR 100 (A) 11/14/2015 2203   UROBILINOGEN 1.0 02/05/2015 1339   NITRITE NEGATIVE 11/14/2015 2203   LEUKOCYTESUR NEGATIVE 11/14/2015 2203   Sepsis  Labs: @LABRCNTIP (procalcitonin:4,lacticidven:4)  )No results found for this or any previous visit (from the past 240 hour(s)).       Radiology Studies: No results found.      Scheduled Meds: . atorvastatin  40 mg Oral q1800  . [START ON 11/17/2015] cosyntropin  0.25 mg Intravenous Once  . enoxaparin (LOVENOX) injection  40 mg Subcutaneous Q24H  . escitalopram  20 mg Oral Daily  . feeding supplement  1 Container Oral TID BM  . fluticasone  2 spray Each Nare Daily  . hydrALAZINE  5 mg Intravenous Once  . losartan  100 mg Oral Daily  . mometasone-formoterol  2 puff Inhalation BID  . montelukast  10 mg Oral QHS  . pantoprazole  40 mg Oral Daily  . sodium chloride flush  3 mL Intravenous Q12H   Continuous Infusions: . sodium chloride 0.9 % 1,000 mL with potassium chloride 40 mEq infusion 75 mL/hr at 11/16/15 1102     LOS: 2 days    Time spent: 5min    Domenic Polite, MD [facility] Pager [phone]  If 7PM-7AM, please contact night-coverage [email or web address] Password [email or web address] 11/16/2015, 2:50 PM

## 2015-11-16 NOTE — Progress Notes (Signed)
   11/16/15 1000  Clinical Encounter Type  Visited With Patient;Family;Patient and family together  Visit Type Initial (ADV Directive)  Referral From Patient;Nurse  Spiritual Encounters  Spiritual Needs Emotional;Literature  [place] met with patient and patient daughter Wells Guiles from [place] regarding HCPOA and ADV Directive; literature dropped off; pt requested [place] contact Leona at [place]; [facility] contacted rabbi, who is away until Wednesday; [facility] available as needed. Gwynn Burly 10:21 AM

## 2015-11-17 ENCOUNTER — Encounter (HOSPITAL_COMMUNITY): Admission: RE | Admit: 2015-11-17 | Payer: Medicare Other | Source: Ambulatory Visit

## 2015-11-17 LAB — CBC
HEMATOCRIT: 40.2 % (ref 36.0–46.0)
Hemoglobin: 12.8 g/dL (ref 12.0–15.0)
MCH: 26.9 pg (ref 26.0–34.0)
MCHC: 31.8 g/dL (ref 30.0–36.0)
MCV: 84.6 fL (ref 78.0–100.0)
Platelets: 217 10*3/uL (ref 150–400)
RBC: 4.75 MIL/uL (ref 3.87–5.11)
RDW: 14.7 % (ref 11.5–15.5)
WBC: 4.7 10*3/uL (ref 4.0–10.5)

## 2015-11-17 LAB — BASIC METABOLIC PANEL
Anion gap: 7 (ref 5–15)
BUN: 8 mg/dL (ref 6–20)
CHLORIDE: 106 mmol/L (ref 101–111)
CO2: 27 mmol/L (ref 22–32)
CREATININE: 0.73 mg/dL (ref 0.44–1.00)
Calcium: 9.8 mg/dL (ref 8.9–10.3)
GFR calc Af Amer: >60 mL/min (ref >60)
GFR calc non Af Amer: >60 mL/min (ref >60)
Glucose, Bld: 96 mg/dL (ref 65–99)
Potassium: 4.4 mmol/L (ref 3.5–5.1)
Sodium: 140 mmol/L (ref 135–145)

## 2015-11-17 LAB — ACTH STIMULATION, 3 TIME POINTS
CORTISOL 30 MIN: 31.3 ug/dL
Cortisol, 60 Min: 36.1 ug/dL
Cortisol, Base: 14.6 ug/dL

## 2015-11-17 LAB — MRSA PCR SCREENING: MRSA by PCR: NEGATIVE

## 2015-11-17 MED ORDER — MAGNESIUM SULFATE 2 GM/50ML IV SOLN
2.0000 g | Freq: Once | INTRAVENOUS | Status: AC
  Administered 2015-11-17: 2 g via INTRAVENOUS
  Filled 2015-11-17: qty 50

## 2015-11-17 MED ORDER — VALPROATE SODIUM 500 MG/5ML IV SOLN
1000.0000 mg | Freq: Once | INTRAVENOUS | Status: AC
  Administered 2015-11-17: 1000 mg via INTRAVENOUS
  Filled 2015-11-17: qty 10

## 2015-11-17 MED ORDER — KETOROLAC TROMETHAMINE 30 MG/ML IJ SOLN
30.0000 mg | Freq: Three times a day (TID) | INTRAMUSCULAR | Status: DC
  Administered 2015-11-17 – 2015-11-18 (×2): 30 mg via INTRAVENOUS
  Filled 2015-11-17 (×2): qty 1

## 2015-11-17 MED ORDER — KETOROLAC TROMETHAMINE 30 MG/ML IJ SOLN: 30.0000 mg | Freq: Three times a day (TID) | INTRAMUSCULAR | Status: DC

## 2015-11-17 MED ORDER — DIPHENHYDRAMINE HCL 50 MG/ML IJ SOLN
12.5000 mg | Freq: Four times a day (QID) | INTRAMUSCULAR | Status: DC
  Administered 2015-11-17 – 2015-11-19 (×8): 12.5 mg via INTRAVENOUS
  Filled 2015-11-17 (×8): qty 1

## 2015-11-17 MED ORDER — ENOXAPARIN SODIUM 40 MG/0.4ML ~~LOC~~ SOLN
40.0000 mg | SUBCUTANEOUS | Status: DC
  Administered 2015-11-18 – 2015-11-19 (×2): 40 mg via SUBCUTANEOUS
  Filled 2015-11-17 (×2): qty 0.4

## 2015-11-17 MED ORDER — PROCHLORPERAZINE EDISYLATE 5 MG/ML IJ SOLN
10.0000 mg | Freq: Four times a day (QID) | INTRAMUSCULAR | Status: DC
  Administered 2015-11-17 – 2015-11-19 (×8): 10 mg via INTRAVENOUS
  Filled 2015-11-17 (×8): qty 2

## 2015-11-17 MED ORDER — KETOROLAC TROMETHAMINE 30 MG/ML IJ SOLN
15.0000 mg | Freq: Four times a day (QID) | INTRAMUSCULAR | Status: DC | PRN
  Administered 2015-11-17: 15 mg via INTRAVENOUS
  Filled 2015-11-17: qty 1

## 2015-11-17 MED ORDER — KETOROLAC TROMETHAMINE 30 MG/ML IJ SOLN: 30.0000 mg | Freq: Four times a day (QID) | INTRAMUSCULAR | Status: DC

## 2015-11-17 NOTE — Care Management (Addendum)
Case Management Note Initial Note Started By Tomi Bamberger, RN,BSN Patient Details  Name: Gina Fritz MRN: [id number] Date of Birth: [birth year]  Subjective/Objective:     Patient is from home with spouse, but he is not able to assist her with anything,he is in a w/chair.  Patient has pcp Dr. Marlyn Corporal at [facility], has Medicare with supplement coverage for medications BCBS.  Per Pt eval rec HHPT, Patient chose [facility], referral made to [place] with [place] for [place], [place], [place]. .  Pateint states she will need a bsc as well which will get through [place].  NCM will cont to follow for dc needs.                  Action/Plan:   Expected Discharge Date:                                   Expected Discharge Plan:  [place]  In-House Referral:     Discharge planning Services  CM Consult  Post Acute Care Choice:    Choice offered to:  Patient  DME Arranged:  Bedside commode DME Agency:  [place]:  RN, PT, Nurse's Aide [facility] Agency:  [place]  Status of Service:  In process, will continue to follow  If discussed at Long Length of Stay Meetings, dates discussed:    Additional Comments: Jacqlyn Krauss, RN, BSN [id number] 11-17-15 Late Entry: CM was able to speak with daughter that Lives in [place]. Per daughter pt will be home alone once stable for d/c. Per daughter pt will not be able to be at home alone. Husband is post surgery and will be in [place] with another daughter. CM did state that PT will be back for reevaluation. PER notes pt was able to work with PT due to resting. CM did make CSW aware that pt may benefit from SNF. CM will continue to f/u.

## 2015-11-17 NOTE — Progress Notes (Signed)
Subjective: Continues to have headache, does report some help from compazine.   Exam: Vitals:   11/17/15 0418 11/17/15 0812  BP: (!) 154/76   Pulse: 74 76  Resp: 17 18  Temp: 97.9 F (36.6 C) 98.2 F (36.8 C)   Gen: In bed, NAD Resp: non-labored breathing, no acute distress Abd: soft, nt  Neuro: MS: awake, alert, interactive.  GX:IVHSJ, EOMI Motor: MAEW Sensory:intact to LT  Pertinent Labs: ESR 30(I do not feel this mild of an elevation is significant) CRP normal  CTA head and neck - no explanation MRI - stable DVAs  Impression: 75 yo F with persistent headache. Given history of migraines, I think that status migrainosus is most likley, and will treat as such for now. LP is also a consideration, but I feel it is relatively low yield at this time. Will continue treatment for migraine, but if no response by tomorrow may need this.  Recommendations: 1)Scheduled compazine/benadryl, depakote 1g x 1, mag 2 g x 1 2) as long as creatinine is stabel, will start toradol scheduled as well.  3) will follow.   Roland Rack, MD [facility] [phone]  If 7pm- 7am, please page neurology on call as listed in [place].

## 2015-11-17 NOTE — Progress Notes (Signed)
PROGRESS NOTE    ICY CHAMPIGNY  [id number] DOB: [birth year] DOA: 11/14/2015 PCP: Stephens Shire, MD  Brief Narrative: Gina Fritz is a 75 y.o. female with medical history significant of COPD, hypertension, irritable bowel syndrome, Migraine without headaches.  Presented with headache nausea vomiting  for the past 8 days she has been seen for this in emergency department 3 times starting on 8/06- Headache seemed to come together with nausea, no diarrhea. discharged home after supportive care, Ct head and other workup was negative - She returned her symptoms resumed was taken to [place] on 08/09 Ct with mild gastric mucosal thickening, started on H2 blocker CT head and MRV negative.  At [facility] she was restarted on losartan 50 mg  -8/10 she was seen by PCP and changed to Hyzaar 50 -12.5. Her headache she attempted to use Tylenol but it did not seem to help, Her nausea and headache has persisted episodic hypertension . Again seen in ER 8/11, Rx with hydralazine 5 mg Compazine 10 mg and given 1 L of normal saline. He seemed better after IV fluids and medications CT scan of the head was done again and was negative she was discharged home with Reglan and Tylenol when necessary headaches. -Came back again on 8/12 due to ongoing HA and vomiting despite Phenergan. Workup negative so far, suspect Status migranous, NEuro consulted, intermittently better and then HA worsens again, plan for Depakote today  Assessment & Plan:   Headache/nausea -4th ER visit in 6days for same -Multiple CT heads here and MRV at [facility] med negative -CT abd pelvis on 8/9 with possible mild gastric mucosal thickening otherwise negative -labs unremarkable, no evidence of infection, no fevers -TSH ok, Random cortisol low, ordered co-syntropin stim test-being done today -Tylenol PRN, toradol/compazine/benadryl added per Neuro, d/w Dr.Kirkpatrick plan for trial of Depakote today -MRI negative,  -diet as tolerated, IVF,  supportive care -continue PPI  Mild hyperglycemia -Hbaic order got cancelled will reorder  COPD -stable, nebs PRN  HTN -continue Losartan, added amlodipine -hydralazine PRN  Hypokalemia -due to hyzaar, replaced  Depression -on lexapro, long term  DVT prophylaxis: Lovenox Code Status:Full Code Family Communication:dtrs  at bedside Disposition Plan: home when improved, ? 1-2days  Subjective: C/o headache and nausea, was better during the day then worsened early am again, didn't get ambien last pm  Objective: Vitals:   11/16/15 2338 11/17/15 0418 11/17/15 0812 11/17/15 1210  BP: (!) 121/52 (!) 154/76  (!) 100/57  Pulse: 71 74 76 77  Resp: 18 17 18 19   Temp: 98.4 F (36.9 C) 97.9 F (36.6 C) 98.2 F (36.8 C) 98.1 F (36.7 C)  TempSrc: Oral Oral Oral Oral  SpO2: 99% 100% 100% 100%  Weight:  52.8 kg (116 lb 4.8 oz)    Height:        Intake/Output Summary (Last 24 hours) at 11/17/15 1458 Last data filed at 11/17/15 0900  Gross per 24 hour  Intake          1361.25 ml  Output              900 ml  Net           461.25 ml   Filed Weights   11/15/15 0507 11/16/15 0457 11/17/15 0418  Weight: 52.6 kg (115 lb 14.4 oz) 54 kg (119 lb) 52.8 kg (116 lb 4.8 oz)    Examination:  General exam: frail, chronically ill appearing, wet towel on forehead Respiratory system: Clear to auscultation.  Respiratory effort normal. Cardiovascular system: S1 & S2 heard, RRR. No JVD, murmurs, rubs, gallops or clicks. No pedal edema. Gastrointestinal system: Abdomen is nondistended, soft and nontender. No organomegaly or masses felt. Normal bowel sounds heard. Central nervous system: Alert and oriented. No focal neurological deficits. Extremities: Symmetric 5 x 5 power. Skin: No rashes, lesions or ulcers Psychiatry: Judgement and insight appear normal. Flat affect     Data Reviewed: I have personally reviewed following labs and imaging studies  CBC:  Recent Labs Lab 11/13/15 1147  11/14/15 1710 11/15/15 1031 11/16/15 0442 11/17/15 0822  WBC 4.3 4.5 3.9* 4.3 4.7  NEUTROABS 2.2  --   --   --   --   HGB 14.3 13.2 12.0 11.7* 12.8  HCT 43.4 39.7 38.1 37.7 40.2  MCV 82.5 80.9 83.7 83.6 84.6  PLT 221 202 164 177 [id number]   Basic Metabolic Panel:  Recent Labs Lab 11/13/15 1147 11/14/15 1710 11/14/15 2218 11/15/15 1031 11/16/15 0442 11/17/15 0822  NA 137 134*  --  138 140 140  K 3.5 2.8*  --  3.4* 3.3* 4.4  CL 98* 97*  --  108 106 106  CO2 27 25  --  25 28 27   GLUCOSE 127* 105*  --  125* 102* 96  BUN 7 5*  --  <5* 8 8  CREATININE 0.83 0.65  --  0.65 0.62 0.73  CALCIUM 9.9 9.5  --  8.8* 9.0 9.8  MG  --   --  1.7 1.9  --   --   PHOS  --   --  3.7 3.3  --   --    GFR: Estimated Creatinine Clearance: 51.4 mL/min (by C-G formula based on SCr of 0.8 mg/dL). Liver Function Tests:  Recent Labs Lab 11/13/15 1147 11/15/15 1031 11/16/15 0442  AST 25 24 24   ALT 18 16 18   ALKPHOS 156* 111 112  BILITOT 1.2 0.5 0.7  PROT 7.8 6.1* 6.0*  ALBUMIN 4.4 3.4* 3.2*   No results for input(s): LIPASE, AMYLASE in the last 168 hours. No results for input(s): AMMONIA in the last 168 hours. Coagulation Profile: No results for input(s): INR, PROTIME in the last 168 hours. Cardiac Enzymes:  Recent Labs Lab 11/14/15 2218 11/15/15 0311 11/15/15 1031  TROPONINI <0.03 <0.03 <0.03   BNP (last 3 results) No results for input(s): PROBNP in the last 8760 hours. HbA1C: No results for input(s): HGBA1C in the last 72 hours. CBG: No results for input(s): GLUCAP in the last 168 hours. Lipid Profile: No results for input(s): CHOL, HDL, LDLCALC, TRIG, CHOLHDL, LDLDIRECT in the last 72 hours. Thyroid Function Tests:  Recent Labs  11/15/15 0311  TSH 2.209   Anemia Panel: No results for input(s): VITAMINB12, FOLATE, FERRITIN, TIBC, IRON, RETICCTPCT in the last 72 hours. Urine analysis:    Component Value Date/Time   COLORURINE YELLOW 11/14/2015 [place]  11/14/2015 2203   LABSPEC 1.013 11/14/2015 2203   PHURINE 7.5 11/14/2015 2203   GLUCOSEU NEGATIVE 11/14/2015 2203   HGBUR NEGATIVE 11/14/2015 2203   BILIRUBINUR NEGATIVE 11/14/2015 2203   KETONESUR NEGATIVE 11/14/2015 2203   PROTEINUR 100 (A) 11/14/2015 2203   UROBILINOGEN 1.0 02/05/2015 1339   NITRITE NEGATIVE 11/14/2015 2203   LEUKOCYTESUR NEGATIVE 11/14/2015 2203   Sepsis Labs: @LABRCNTIP (procalcitonin:4,lacticidven:4)  ) Recent Results (from the past 240 hour(s))  MRSA PCR Screening     Status: None   Collection Time: 11/17/15  9:29 AM  Result Value Ref Range  Status   MRSA by PCR NEGATIVE NEGATIVE Final    Comment:        The GeneXpert MRSA Assay (FDA approved for NASAL specimens only), is one component of a comprehensive MRSA colonization surveillance program. It is not intended to diagnose MRSA infection nor to guide or monitor treatment for MRSA infections.          Radiology Studies: Ct Angio Head W Or Wo Contrast  Result Date: 11/16/2015 CLINICAL DATA:  75 y/o F; patient reports headache with history of dizziness and nasal sinus surgery. EXAM: CT ANGIOGRAPHY HEAD AND NECK TECHNIQUE: Multidetector CT imaging of the head and neck was performed using the standard protocol during bolus administration of intravenous contrast. Multiplanar CT image reconstructions and MIPs were obtained to evaluate the vascular anatomy. Carotid stenosis measurements (when applicable) are obtained utilizing NASCET criteria, using the distal internal carotid diameter as the denominator. CONTRAST:  50 cc Isovue 370 COMPARISON:  MRI of the brain dated 11/16/2015, CT of the head dated 11/13/2015, and MRI of brain dated 05/06/2013. FINDINGS: CT HEAD Brain: No evidence of large acute infarct, mass effect, or intracranial hemorrhage. 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 Mild chronic microvascular ischemic changes and parenchymal volume loss. Calvarium and  skull base: No displaced skull fracture. No suspicious osseous lesion is identified. Paranasal sinuses: Visualized paranasal sinuses and mastoid air cells are clear. Orbits: Bilateral intra-ocular lens replacement. Otherwise the orbits are unremarkable. CTA NECK Aortic arch: Aberrant right subclavian artery. Three-vessel arch. Mild calcific aortic atherosclerosis. Right carotid system: No occlusion, aneurysm, dissection, or significant stenosis is identified. Mild calcifications at the bifurcation. Left carotid system: No occlusion, aneurysm, dissection, or significant stenosis is identified. Mild calcifications of the bifurcation. Vertebral arteries:No occlusion, aneurysm, dissection, or significant stenosis is identified. Left dominant system. Skeleton: Moderate multilevel cervical degenerative changes greatest at the C5-6 level with there is disc space narrowing and marginal osteophytes. Moderate cervical facet arthrosis. No acute osseous abnormality is identified. Other neck: Thyroid nodules the largest in the right lobe measuring 14 x 12 mm. Salivary glands are unremarkable. No cervical lymphadenopathy. The aerodigestive tract is unremarkable. Nodules within the right lower neck subcutaneous fat and contact with the skin surface measuring up to 20 x 12 mm series 501, image 60 likely represent dermal appendage cysts. Upper chest: Moderate biapical pulmonary emphysema. Biapical scarring with calcifications. Pulmonary nodule in right upper lobe measuring 5 mm series 501, image 3. CTA HEAD Anterior circulation: No occlusion, aneurysm, dissection, or significant stenosis is identified. Mild calcific atherosclerosis of the right cavernous internal carotid artery. Posterior circulation: No occlusion, aneurysm, dissection, or significant stenosis is identified. Proximal basilar fenestration. Venous sinuses: Patent. 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 Anatomic variants: Patent bilateral  posterior communicating arteries. No anterior communicating artery is identified. Delayed phase: No abnormal enhancement. IMPRESSION: 1. No large vessel occlusion, aneurysm, dissection, or significant stenosis of the circle of Willis or the carotid and vertebral arteries of the neck. 2. Moderate pulmonary emphysema. 3. **An incidental finding of potential clinical significance has been found. No follow-up needed if patient is low-risk. Non-contrast chest CT can be considered in 12 months if patient is high-risk. This recommendation follows the consensus statement: Guidelines for Management of Incidental Pulmonary Nodules Detected on CT Images:From the Fleischner Society 2017; published online before print (10.1148/radiol.IJ:2314499).** Electronically Signed   By: Kristine Garbe M.D.   On: 11/16/2015 22:34   Ct Angio Neck W Or Wo Contrast  Result Date: 11/16/2015 CLINICAL DATA:  75 y/o F; patient reports headache with history of dizziness and nasal sinus surgery. EXAM: CT ANGIOGRAPHY HEAD AND NECK TECHNIQUE: Multidetector CT imaging of the head and neck was performed using the standard protocol during bolus administration of intravenous contrast. Multiplanar CT image reconstructions and MIPs were obtained to evaluate the vascular anatomy. Carotid stenosis measurements (when applicable) are obtained utilizing NASCET criteria, using the distal internal carotid diameter as the denominator. CONTRAST:  50 cc Isovue 370 COMPARISON:  MRI of the brain dated 11/16/2015, CT of the head dated 11/13/2015, and MRI of brain dated 05/06/2013. FINDINGS: CT HEAD Brain: No evidence of large acute infarct, mass effect, or intracranial hemorrhage. 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 Mild chronic microvascular ischemic changes and parenchymal volume loss. Calvarium and skull base: No displaced skull fracture. No suspicious osseous lesion is identified. Paranasal sinuses: Visualized  paranasal sinuses and mastoid air cells are clear. Orbits: Bilateral intra-ocular lens replacement. Otherwise the orbits are unremarkable. CTA NECK Aortic arch: Aberrant right subclavian artery. Three-vessel arch. Mild calcific aortic atherosclerosis. Right carotid system: No occlusion, aneurysm, dissection, or significant stenosis is identified. Mild calcifications at the bifurcation. Left carotid system: No occlusion, aneurysm, dissection, or significant stenosis is identified. Mild calcifications of the bifurcation. Vertebral arteries:No occlusion, aneurysm, dissection, or significant stenosis is identified. Left dominant system. Skeleton: Moderate multilevel cervical degenerative changes greatest at the C5-6 level with there is disc space narrowing and marginal osteophytes. Moderate cervical facet arthrosis. No acute osseous abnormality is identified. Other neck: Thyroid nodules the largest in the right lobe measuring 14 x 12 mm. Salivary glands are unremarkable. No cervical lymphadenopathy. The aerodigestive tract is unremarkable. Nodules within the right lower neck subcutaneous fat and contact with the skin surface measuring up to 20 x 12 mm series 501, image 60 likely represent dermal appendage cysts. Upper chest: Moderate biapical pulmonary emphysema. Biapical scarring with calcifications. Pulmonary nodule in right upper lobe measuring 5 mm series 501, image 3. CTA HEAD Anterior circulation: No occlusion, aneurysm, dissection, or significant stenosis is identified. Mild calcific atherosclerosis of the right cavernous internal carotid artery. Posterior circulation: No occlusion, aneurysm, dissection, or significant stenosis is identified. Proximal basilar fenestration. Venous sinuses: Patent. 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 Anatomic variants: Patent bilateral posterior communicating arteries. No anterior communicating artery is identified. Delayed phase: No abnormal enhancement.  IMPRESSION: 1. No large vessel occlusion, aneurysm, dissection, or significant stenosis of the circle of Willis or the carotid and vertebral arteries of the neck. 2. Moderate pulmonary emphysema. 3. **An incidental finding of potential clinical significance has been found. No follow-up needed if patient is low-risk. Non-contrast chest CT can be considered in 12 months if patient is high-risk. This recommendation follows the consensus statement: Guidelines for Management of Incidental Pulmonary Nodules Detected on CT Images:From the Fleischner Society 2017; published online before print (10.1148/radiol.SG:[id number]).** Electronically Signed   By: Kristine Garbe M.D.   On: 11/16/2015 22:34   Mr Brain Wo Contrast  Result Date: 11/16/2015 CLINICAL DATA:  Frontal headache for 1 week, vomiting. Hypertensive. EXAM: MRI HEAD WITHOUT CONTRAST TECHNIQUE: Multiplanar, multiecho pulse sequences of the brain and surrounding structures were obtained without intravenous contrast. COMPARISON:  CT HEAD November 13, 2015 and MRI head May 06, 2013 FINDINGS: INTRACRANIAL CONTENTS: No reduced diffusion to suggest acute ischemia. No susceptibility artifact to suggest hemorrhage. The ventricles and sulci are normal for patient's age. No suspicious parenchymal signal, masses or mass  effect. No abnormal extra-axial fluid collections. Mild stable supratentorial and pontine white matter changes compatible with chronic small vessel ischemic disease. Two adjacent RIGHT frontal developmental venous anomalies, unchanged. No extra-axial masses though, contrast enhanced sequences would be more sensitive. Normal major intracranial vascular flow voids present at skull base. ORBITS: The included ocular globes and orbital contents are non-suspicious. Status post bilateral ocular lens implants. SINUSES: The mastoid air-cells and included paranasal sinuses are well-aerated. SKULL/SOFT TISSUES: No abnormal sellar expansion. No suspicious  calvarial bone marrow signal. Craniocervical junction maintained. Patient is edentulous. IMPRESSION: No acute intracranial process. Stable appearance of the head from prior MRI including 2 adjacent RIGHT frontal developmental venous anomalies. Electronically Signed   By: Elon Alas M.D.   On: 11/16/2015 21:47        Scheduled Meds: . amLODipine  10 mg Oral Daily  . atorvastatin  40 mg Oral q1800  . prochlorperazine  10 mg Intravenous Q6H   And  . diphenhydrAMINE  12.5 mg Intravenous Q6H  . enoxaparin (LOVENOX) injection  40 mg Subcutaneous Q24H  . escitalopram  20 mg Oral Daily  . feeding supplement  1 Container Oral TID BM  . fluticasone  2 spray Each Nare Daily  . hydrALAZINE  5 mg Intravenous Once  . losartan  100 mg Oral Daily  . mometasone-formoterol  2 puff Inhalation BID  . montelukast  10 mg Oral QHS  . pantoprazole  40 mg Oral Daily  . sodium chloride flush  3 mL Intravenous Q12H   Continuous Infusions: . sodium chloride 0.9 % 1,000 mL with potassium chloride 40 mEq infusion 75 mL/hr at 11/17/15 1100     LOS: 3 days    Time spent: 69min    Domenic Polite, MD [facility] Pager [phone]  If 7PM-7AM, please contact night-coverage [email or web address] Password [facility] 11/17/2015, 2:58 PM

## 2015-11-17 NOTE — Progress Notes (Signed)
PT Cancellation Note  Patient Details Name: Gina Fritz MRN: [id number] DOB: 1940/11/14   Cancelled Treatment:    Reason Eval/Treat Not Completed: Other (comment) (Family refused, asked PT to let pt rest).  Try tomorrow if pt and family will allow.   Ramond Dial 11/17/2015, 1:12 PM    Mee Hives, PT [facility] Acute [facility] Dept. Number: [place] and [place]

## 2015-11-17 NOTE — Care Management Important Message (Signed)
Important Message  Patient Details  Name: Gina Fritz MRN: [id number] Date of Birth: 1941/03/13   Medicare Important Message Given:  Yes    Nathen May 11/17/2015, 10:43 AM

## 2015-11-17 NOTE — Progress Notes (Signed)
Patient has ACTH Stimulation scheduled at 0500... Called lab to advise need a lab tech present at time of medication administration.. Lab advised will send tech to floor... Asked lab tech that was present on the floor if she will be able to draw lab, she advised ACTH stimulations are only done between 7 am and 10 am

## 2015-11-17 NOTE — Progress Notes (Addendum)
Pt headache has been 1-2 today. Significant improvement. Pt bp has also been stable. No nausea today

## 2015-11-18 DIAGNOSIS — G43A1 Cyclical vomiting, intractable: Secondary | ICD-10-CM

## 2015-11-18 LAB — BASIC METABOLIC PANEL
Anion gap: 7 (ref 5–15)
BUN: 6 mg/dL (ref 6–20)
CHLORIDE: 107 mmol/L (ref 101–111)
CO2: 26 mmol/L (ref 22–32)
CREATININE: 0.69 mg/dL (ref 0.44–1.00)
Calcium: 9.3 mg/dL (ref 8.9–10.3)
GFR calc Af Amer: >60 mL/min (ref >60)
GLUCOSE: 86 mg/dL (ref 65–99)
Potassium: 4.5 mmol/L (ref 3.5–5.1)
SODIUM: 140 mmol/L (ref 135–145)

## 2015-11-18 LAB — CBC
HCT: 38 % (ref 36.0–46.0)
Hemoglobin: 11.7 g/dL — ABNORMAL LOW (ref 12.0–15.0)
MCH: 26.2 pg (ref 26.0–34.0)
MCHC: 30.8 g/dL (ref 30.0–36.0)
MCV: 85.2 fL (ref 78.0–100.0)
PLATELETS: 179 10*3/uL (ref 150–400)
RBC: 4.46 MIL/uL (ref 3.87–5.11)
RDW: 14.8 % (ref 11.5–15.5)
WBC: 3.8 10*3/uL — AB (ref 4.0–10.5)

## 2015-11-18 LAB — HEMOGLOBIN A1C
Hgb A1c MFr Bld: 4.8 % (ref 4.8–5.6)
MEAN PLASMA GLUCOSE: 91 mg/dL

## 2015-11-18 MED ORDER — GABAPENTIN 300 MG PO CAPS
300.0000 mg | ORAL_CAPSULE | Freq: Three times a day (TID) | ORAL | Status: DC
  Administered 2015-11-18 – 2015-11-20 (×7): 300 mg via ORAL
  Filled 2015-11-18 (×7): qty 1

## 2015-11-18 NOTE — Progress Notes (Addendum)
PROGRESS NOTE    LENNOX KANTOR  [id number] DOB: 1941-02-09 DOA: 11/14/2015 PCP: Stephens Shire, MD  Brief Narrative: Gina Fritz is a 75 y.o. female with medical history significant of COPD, hypertension, irritable bowel syndrome, Migraine without headaches.  Presented with headache nausea vomiting  for the past 8 days she has been seen for this in emergency department 3 times starting on 8/06- Headache seemed to come together with nausea, no diarrhea. discharged home after supportive care, Ct head and other workup was negative - She returned her symptoms resumed was taken to [place] on 08/09 Ct with mild gastric mucosal thickening, started on H2 blocker CT head and MRV negative.  At [facility] she was restarted on losartan 50 mg  -8/10 she was seen by PCP and changed to Hyzaar 50 -12.5. Her headache she attempted to use Tylenol but it did not seem to help, Her nausea and headache has persisted episodic hypertension . Again seen in ER 8/11, Rx with hydralazine 5 mg Compazine 10 mg and given 1 L of normal saline. He seemed better after IV fluids and medications CT scan of the head was done again and was negative she was discharged home with Reglan and Tylenol when necessary headaches. -Came back again on 8/12 due to ongoing HA and vomiting despite Phenergan. Workup negative so far, suspect Status migranous, NEuro consulted, intermittently better and then HA worsens again, plan for Depakote today  Assessment & Plan:   Headache/status migrainous -4th ER visit in 6days for same -Multiple CT heads here and MRV at [facility] med negative -CT abd pelvis on 8/9 with possible mild gastric mucosal thickening otherwise negative -labs unremarkable, no evidence of infection, no fevers -TSH ok, Random cortisol low  Scheduled compazine/benadryl , completed at 4 AM this morning d/c toradol Started  gabapentin 300mg  TID 8/16. Patient concerned about being discharged today and she was still receiving  scheduled doses of Compazine and Benadryl yesterday -MRI negative, no indication for lumbar puncture Anticipate discharge to SNF tomorrow   Mild hyperglycemia Hemoglobin A1c 4.8  COPD -stable, nebs PRN  HTN -continue Losartan, added amlodipine -hydralazine PRN  Hypokalemia -due to hyzaar, replaced  Depression -on lexapro, long term  DVT prophylaxis: Lovenox Code Status:Full Code Family Communication:dtrs  at bedside Disposition Plan:  SNF tomorrow   Subjective: Headache is significantly better overnight  Objective: Vitals:   11/18/15 0032 11/18/15 0417 11/18/15 0753 11/18/15 1008  BP: (!) 166/72 115/70 (!) 149/90   Pulse: 72 66 96   Resp: 18 20 19    Temp: 97.8 F (36.6 C) 97.5 F (36.4 C) 98.8 F (37.1 C)   TempSrc: Oral Oral Oral   SpO2: 94% 96% 96% 96%  Weight:  53.7 kg (118 lb 6.4 oz)    Height:        Intake/Output Summary (Last 24 hours) at 11/18/15 1126 Last data filed at 11/18/15 0900  Gross per 24 hour  Intake             2115 ml  Output              750 ml  Net             1365 ml   Filed Weights   11/16/15 0457 11/17/15 0418 11/18/15 0417  Weight: 54 kg (119 lb) 52.8 kg (116 lb 4.8 oz) 53.7 kg (118 lb 6.4 oz)    Examination:  General exam: frail, chronically ill appearing, wet towel on forehead Respiratory system: Clear  to auscultation. Respiratory effort normal. Cardiovascular system: S1 & S2 heard, RRR. No JVD, murmurs, rubs, gallops or clicks. No pedal edema. Gastrointestinal system: Abdomen is nondistended, soft and nontender. No organomegaly or masses felt. Normal bowel sounds heard. Central nervous system: Alert and oriented. No focal neurological deficits. Extremities: Symmetric 5 x 5 power. Skin: No rashes, lesions or ulcers Psychiatry: Judgement and insight appear normal. Flat affect     Data Reviewed: I have personally reviewed following labs and imaging studies  CBC:  Recent Labs Lab 11/13/15 1147 11/14/15 1710  11/15/15 1031 11/16/15 0442 11/17/15 0822 11/18/15 0624  WBC 4.3 4.5 3.9* 4.3 4.7 3.8*  NEUTROABS 2.2  --   --   --   --   --   HGB 14.3 13.2 12.0 11.7* 12.8 11.7*  HCT 43.4 39.7 38.1 37.7 40.2 38.0  MCV 82.5 80.9 83.7 83.6 84.6 85.2  PLT 221 202 164 177 217 0000000   Basic Metabolic Panel:  Recent Labs Lab 11/14/15 1710 11/14/15 2218 11/15/15 1031 11/16/15 0442 11/17/15 0822 11/18/15 0624  NA 134*  --  138 140 140 140  K 2.8*  --  3.4* 3.3* 4.4 4.5  CL 97*  --  108 106 106 107  CO2 25  --  25 28 27 26   GLUCOSE 105*  --  125* 102* 96 86  BUN 5*  --  <5* 8 8 6   CREATININE 0.65  --  0.65 0.62 0.73 0.69  CALCIUM 9.5  --  8.8* 9.0 9.8 9.3  MG  --  1.7 1.9  --   --   --   PHOS  --  3.7 3.3  --   --   --    GFR: Estimated Creatinine Clearance: 52.3 mL/min (by C-G formula based on SCr of 0.8 mg/dL). Liver Function Tests:  Recent Labs Lab 11/13/15 1147 11/15/15 1031 11/16/15 0442  AST 25 24 24   ALT 18 16 18   ALKPHOS 156* 111 112  BILITOT 1.2 0.5 0.7  PROT 7.8 6.1* 6.0*  ALBUMIN 4.4 3.4* 3.2*   No results for input(s): LIPASE, AMYLASE in the last 168 hours. No results for input(s): AMMONIA in the last 168 hours. Coagulation Profile: No results for input(s): INR, PROTIME in the last 168 hours. Cardiac Enzymes:  Recent Labs Lab 11/14/15 2218 11/15/15 0311 11/15/15 1031  TROPONINI <0.03 <0.03 <0.03   BNP (last 3 results) No results for input(s): PROBNP in the last 8760 hours. HbA1C:  Recent Labs  11/17/15 1054  HGBA1C 4.8   CBG: No results for input(s): GLUCAP in the last 168 hours. Lipid Profile: No results for input(s): CHOL, HDL, LDLCALC, TRIG, CHOLHDL, LDLDIRECT in the last 72 hours. Thyroid Function Tests: No results for input(s): TSH, T4TOTAL, FREET4, T3FREE, THYROIDAB in the last 72 hours. Anemia Panel: No results for input(s): VITAMINB12, FOLATE, FERRITIN, TIBC, IRON, RETICCTPCT in the last 72 hours. Urine analysis:    Component Value Date/Time    COLORURINE YELLOW 11/14/2015 [place] 11/14/2015 2203   LABSPEC 1.013 11/14/2015 2203   PHURINE 7.5 11/14/2015 2203   GLUCOSEU NEGATIVE 11/14/2015 2203   HGBUR NEGATIVE 11/14/2015 2203   BILIRUBINUR NEGATIVE 11/14/2015 2203   KETONESUR NEGATIVE 11/14/2015 2203   PROTEINUR 100 (A) 11/14/2015 2203   UROBILINOGEN 1.0 02/05/2015 1339   NITRITE NEGATIVE 11/14/2015 2203   LEUKOCYTESUR NEGATIVE 11/14/2015 2203   Sepsis Labs: @LABRCNTIP (procalcitonin:4,lacticidven:4)  ) Recent Results (from the past 240 hour(s))  MRSA PCR Screening  Status: None   Collection Time: 11/17/15  9:29 AM  Result Value Ref Range Status   MRSA by PCR NEGATIVE NEGATIVE Final    Comment:        The GeneXpert MRSA Assay (FDA approved for NASAL specimens only), is one component of a comprehensive MRSA colonization surveillance program. It is not intended to diagnose MRSA infection nor to guide or monitor treatment for MRSA infections.          Radiology Studies: Ct Angio Head W Or Wo Contrast  Result Date: 11/16/2015 CLINICAL DATA:  75 y/o F; patient reports headache with history of dizziness and nasal sinus surgery. EXAM: CT ANGIOGRAPHY HEAD AND NECK TECHNIQUE: Multidetector CT imaging of the head and neck was performed using the standard protocol during bolus administration of intravenous contrast. Multiplanar CT image reconstructions and MIPs were obtained to evaluate the vascular anatomy. Carotid stenosis measurements (when applicable) are obtained utilizing NASCET criteria, using the distal internal carotid diameter as the denominator. CONTRAST:  50 cc Isovue 370 COMPARISON:  MRI of the brain dated 11/16/2015, CT of the head dated 11/13/2015, and MRI of brain dated 05/06/2013. FINDINGS: CT HEAD Brain: No evidence of large acute infarct, mass effect, or intracranial hemorrhage. 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 Mild chronic microvascular  ischemic changes and parenchymal volume loss. Calvarium and skull base: No displaced skull fracture. No suspicious osseous lesion is identified. Paranasal sinuses: Visualized paranasal sinuses and mastoid air cells are clear. Orbits: Bilateral intra-ocular lens replacement. Otherwise the orbits are unremarkable. CTA NECK Aortic arch: Aberrant right subclavian artery. Three-vessel arch. Mild calcific aortic atherosclerosis. Right carotid system: No occlusion, aneurysm, dissection, or significant stenosis is identified. Mild calcifications at the bifurcation. Left carotid system: No occlusion, aneurysm, dissection, or significant stenosis is identified. Mild calcifications of the bifurcation. Vertebral arteries:No occlusion, aneurysm, dissection, or significant stenosis is identified. Left dominant system. Skeleton: Moderate multilevel cervical degenerative changes greatest at the C5-6 level with there is disc space narrowing and marginal osteophytes. Moderate cervical facet arthrosis. No acute osseous abnormality is identified. Other neck: Thyroid nodules the largest in the right lobe measuring 14 x 12 mm. Salivary glands are unremarkable. No cervical lymphadenopathy. The aerodigestive tract is unremarkable. Nodules within the right lower neck subcutaneous fat and contact with the skin surface measuring up to 20 x 12 mm series 501, image 60 likely represent dermal appendage cysts. Upper chest: Moderate biapical pulmonary emphysema. Biapical scarring with calcifications. Pulmonary nodule in right upper lobe measuring 5 mm series 501, image 3. CTA HEAD Anterior circulation: No occlusion, aneurysm, dissection, or significant stenosis is identified. Mild calcific atherosclerosis of the right cavernous internal carotid artery. Posterior circulation: No occlusion, aneurysm, dissection, or significant stenosis is identified. Proximal basilar fenestration. Venous sinuses: Patent. 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 Anatomic variants: Patent bilateral posterior communicating arteries. No anterior communicating artery is identified. Delayed phase: No abnormal enhancement. IMPRESSION: 1. No large vessel occlusion, aneurysm, dissection, or significant stenosis of the circle of Willis or the carotid and vertebral arteries of the neck. 2. Moderate pulmonary emphysema. 3. **An incidental finding of potential clinical significance has been found. No follow-up needed if patient is low-risk. Non-contrast chest CT can be considered in 12 months if patient is high-risk. This recommendation follows the consensus statement: Guidelines for Management of Incidental Pulmonary Nodules Detected on CT Images:From the Fleischner Society 2017; published online before print (10.1148/radiol.IJ:2314499).** Electronically Signed   By: Edgardo Roys.D.  On: 11/16/2015 22:34   Ct Angio Neck W Or Wo Contrast  Result Date: 11/16/2015 CLINICAL DATA:  75 y/o F; patient reports headache with history of dizziness and nasal sinus surgery. EXAM: CT ANGIOGRAPHY HEAD AND NECK TECHNIQUE: Multidetector CT imaging of the head and neck was performed using the standard protocol during bolus administration of intravenous contrast. Multiplanar CT image reconstructions and MIPs were obtained to evaluate the vascular anatomy. Carotid stenosis measurements (when applicable) are obtained utilizing NASCET criteria, using the distal internal carotid diameter as the denominator. CONTRAST:  50 cc Isovue 370 COMPARISON:  MRI of the brain dated 11/16/2015, CT of the head dated 11/13/2015, and MRI of brain dated 05/06/2013. FINDINGS: CT HEAD Brain: No evidence of large acute infarct, mass effect, or intracranial hemorrhage. 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 Mild chronic microvascular ischemic changes and parenchymal volume loss. Calvarium and skull base: No displaced skull fracture. No suspicious  osseous lesion is identified. Paranasal sinuses: Visualized paranasal sinuses and mastoid air cells are clear. Orbits: Bilateral intra-ocular lens replacement. Otherwise the orbits are unremarkable. CTA NECK Aortic arch: Aberrant right subclavian artery. Three-vessel arch. Mild calcific aortic atherosclerosis. Right carotid system: No occlusion, aneurysm, dissection, or significant stenosis is identified. Mild calcifications at the bifurcation. Left carotid system: No occlusion, aneurysm, dissection, or significant stenosis is identified. Mild calcifications of the bifurcation. Vertebral arteries:No occlusion, aneurysm, dissection, or significant stenosis is identified. Left dominant system. Skeleton: Moderate multilevel cervical degenerative changes greatest at the C5-6 level with there is disc space narrowing and marginal osteophytes. Moderate cervical facet arthrosis. No acute osseous abnormality is identified. Other neck: Thyroid nodules the largest in the right lobe measuring 14 x 12 mm. Salivary glands are unremarkable. No cervical lymphadenopathy. The aerodigestive tract is unremarkable. Nodules within the right lower neck subcutaneous fat and contact with the skin surface measuring up to 20 x 12 mm series 501, image 60 likely represent dermal appendage cysts. Upper chest: Moderate biapical pulmonary emphysema. Biapical scarring with calcifications. Pulmonary nodule in right upper lobe measuring 5 mm series 501, image 3. CTA HEAD Anterior circulation: No occlusion, aneurysm, dissection, or significant stenosis is identified. Mild calcific atherosclerosis of the right cavernous internal carotid artery. Posterior circulation: No occlusion, aneurysm, dissection, or significant stenosis is identified. Proximal basilar fenestration. Venous sinuses: Patent. 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 Anatomic variants: Patent bilateral posterior communicating arteries. No anterior communicating  artery is identified. Delayed phase: No abnormal enhancement. IMPRESSION: 1. No large vessel occlusion, aneurysm, dissection, or significant stenosis of the circle of Willis or the carotid and vertebral arteries of the neck. 2. Moderate pulmonary emphysema. 3. **An incidental finding of potential clinical significance has been found. No follow-up needed if patient is low-risk. Non-contrast chest CT can be considered in 12 months if patient is high-risk. This recommendation follows the consensus statement: Guidelines for Management of Incidental Pulmonary Nodules Detected on CT Images:From the Fleischner Society 2017; published online before print (10.1148/radiol.IJ:2314499).** Electronically Signed   By: Kristine Garbe M.D.   On: 11/16/2015 22:34   Mr Brain Wo Contrast  Result Date: 11/16/2015 CLINICAL DATA:  Frontal headache for 1 week, vomiting. Hypertensive. EXAM: MRI HEAD WITHOUT CONTRAST TECHNIQUE: Multiplanar, multiecho pulse sequences of the brain and surrounding structures were obtained without intravenous contrast. COMPARISON:  CT HEAD November 13, 2015 and MRI head May 06, 2013 FINDINGS: INTRACRANIAL CONTENTS: No reduced diffusion to suggest acute ischemia. No susceptibility artifact to suggest hemorrhage. The ventricles and  sulci are normal for patient's age. No suspicious parenchymal signal, masses or mass effect. No abnormal extra-axial fluid collections. Mild stable supratentorial and pontine white matter changes compatible with chronic small vessel ischemic disease. Two adjacent RIGHT frontal developmental venous anomalies, unchanged. No extra-axial masses though, contrast enhanced sequences would be more sensitive. Normal major intracranial vascular flow voids present at skull base. ORBITS: The included ocular globes and orbital contents are non-suspicious. Status post bilateral ocular lens implants. SINUSES: The mastoid air-cells and included paranasal sinuses are well-aerated.  SKULL/SOFT TISSUES: No abnormal sellar expansion. No suspicious calvarial bone marrow signal. Craniocervical junction maintained. Patient is edentulous. IMPRESSION: No acute intracranial process. Stable appearance of the head from prior MRI including 2 adjacent RIGHT frontal developmental venous anomalies. Electronically Signed   By: Elon Alas M.D.   On: 11/16/2015 21:47        Scheduled Meds: . amLODipine  10 mg Oral Daily  . atorvastatin  40 mg Oral q1800  . prochlorperazine  10 mg Intravenous Q6H   And  . diphenhydrAMINE  12.5 mg Intravenous Q6H  . enoxaparin (LOVENOX) injection  40 mg Subcutaneous Q24H  . escitalopram  20 mg Oral Daily  . feeding supplement  1 Container Oral TID BM  . fluticasone  2 spray Each Nare Daily  . gabapentin  300 mg Oral TID  . hydrALAZINE  5 mg Intravenous Once  . losartan  100 mg Oral Daily  . mometasone-formoterol  2 puff Inhalation BID  . montelukast  10 mg Oral QHS  . pantoprazole  40 mg Oral Daily  . sodium chloride flush  3 mL Intravenous Q12H   Continuous Infusions: . sodium chloride 0.9 % 1,000 mL with potassium chloride 40 mEq infusion 75 mL/hr at 11/18/15 0548     LOS: 4 days    Time spent: 82min    Reyne Dumas, MD [facility] Pager [phone]  If 7PM-7AM, please contact night-coverage [email or web address] Password [facility] 11/18/2015, 11:26 AM

## 2015-11-18 NOTE — Progress Notes (Signed)
Physical Therapy Treatment Patient Details Name: Gina Fritz MRN: [id number] DOB: 1940-04-29 Today's Date: 11/18/2015    History of Present Illness Patient is a 75 y/o female with hx of HTN, HLD, dizziness, depression, COPD, CAP and anxiety presents with nausea, vomiting and headaches. Has been to the ER 4 times in the last month for same symptoms. CT abd pelvis on 8/9 with possible mild gastric mucosal thickening otherwise negative. Workup pending.    PT Comments    Pt continues to need assistance for mobility and gait. Pt continues to be a falls risk. Upon d/c pt will need continued physical assistance and supervision.  Pt is not safe for d/c home due to inadequate level of assist. Recommendations have changed to d/c to SNF.   Follow Up Recommendations  SNF     Equipment Recommendations  None recommended by PT    Recommendations for Other Services       Precautions / Restrictions Precautions Precautions: Fall Restrictions Weight Bearing Restrictions: No    Mobility  Bed Mobility Overal bed mobility: Needs Assistance Bed Mobility: Supine to Sit     Supine to sit: HOB elevated;Supervision     General bed mobility comments: Pt took increased time, used the bedrail and was supervised for saftey  and vc to scoot foward  Transfers Overall transfer level: Needs assistance Equipment used: None Transfers: Sit to/from Stand Sit to Stand: Min assist         General transfer comment: Pt needed  min assist to sit to stand and to gain balance in standing.   Ambulation/Gait Ambulation/Gait assistance: Min assist Ambulation Distance (Feet): 70 Feet Assistive device: None Gait Pattern/deviations: Step-through pattern;Shuffle;Antalgic;Decreased stride length;Trunk flexed Gait velocity: decreased Gait velocity interpretation: <1.8 ft/sec, indicative of risk for recurrent falls General Gait Details: Pt needed min assistance to maintain stablity on her feet. Pt walked with a  shuffling gait and trouble with weight shifting. Pt ambulated down the hall 70 ft but needed two standing rest breaks due to fatigue.    Stairs            Wheelchair Mobility    Modified Rankin (Stroke Patients Only)       Balance Overall balance assessment: Needs assistance Sitting-balance support: No upper extremity supported Sitting balance-Leahy Scale: Good Sitting balance - Comments: Pt able to sit and perform pericare without physical assistance    Standing balance support: No upper extremity supported Standing balance-Leahy Scale: Fair Standing balance comment: Pt able to maintain balance when performing functional tasks at the sink                    Cognition Arousal/Alertness: Awake/alert Behavior During Therapy: [facility] for tasks assessed/performed Overall Cognitive Status: Within Functional Limits for tasks assessed                      Exercises      General Comments General comments (skin integrity, edema, etc.): When Pt returned to the room from walking in the hall, pt ambulated to beside commode with min assist. Pt used bedside comode and performed seated pericare without physical assistance.      Pertinent Vitals/Pain Pain Assessment: Faces Faces Pain Scale: Hurts little more Pain Location: head Pain Descriptors / Indicators: Heaviness Pain Intervention(s): Limited activity within patient's tolerance;Monitored during session;Repositioned    Home Living                      Prior Function  PT Goals (current goals can now be found in the care plan section) Acute Rehab PT Goals Patient Stated Goal: return to being independent  PT Goal Formulation: With patient Time For Goal Achievement: 11/30/15 Potential to Achieve Goals: Fair Progress towards PT goals: Progressing toward goals    Frequency  Min 3X/week    PT Plan Discharge plan needs to be updated    Co-evaluation             End of Session  Equipment Utilized During Treatment: Gait belt Activity Tolerance: Patient tolerated treatment well Patient left: in chair;with call bell/phone within reach;with family/visitor present     Time: 1340-1410 PT Time Calculation (min) (ACUTE ONLY): 30 min  Charges:  $Gait Training: 8-22 mins $Therapeutic Activity: 8-22 mins                    G Codes:      Gina Fritz 11-25-15, 5:26 PM Clarene Essex, SPT

## 2015-11-18 NOTE — Progress Notes (Signed)
Subjective: Markedly improved today. Headache now 1/10.   Exam: Vitals:   11/18/15 0417 11/18/15 0753  BP: 115/70 (!) 149/90  Pulse: 66 96  Resp: 20 19  Temp: 97.5 F (36.4 C) 98.8 F (37.1 C)   Gen: In bed, NAD Resp: non-labored breathing, no acute distress Abd: soft, nt  Neuro: MS: awake, alert, interactive.  [id number], EOMI Motor: MAEW Sensory:intact to LT   Impression: 75 yo F with persistent headache. 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 I feel that LP is low yield at this point and would not pursue it unless she continues having refractory headaches.   Recommendations: 1)Scheduled compazine/benadryl 2) d/c toradol 3) start gabapentin 300mg  TID.   Roland Rack, MD [facility] [phone]  If 7pm- 7am, please page neurology on call as listed in [place].

## 2015-11-19 ENCOUNTER — Encounter (HOSPITAL_COMMUNITY): Admission: RE | Admit: 2015-11-19 | Payer: Medicare Other | Source: Ambulatory Visit

## 2015-11-19 DIAGNOSIS — G44011 Episodic cluster headache, intractable: Secondary | ICD-10-CM

## 2015-11-19 LAB — COMPREHENSIVE METABOLIC PANEL
ALK PHOS: 109 U/L (ref 38–126)
ALT: 29 U/L (ref 14–54)
AST: 36 U/L (ref 15–41)
Albumin: 3.4 g/dL — ABNORMAL LOW (ref 3.5–5.0)
Anion gap: 8 (ref 5–15)
BUN: 14 mg/dL (ref 6–20)
CALCIUM: 9.5 mg/dL (ref 8.9–10.3)
CHLORIDE: 102 mmol/L (ref 101–111)
CO2: 28 mmol/L (ref 22–32)
CREATININE: 0.79 mg/dL (ref 0.44–1.00)
GFR calc Af Amer: >60 mL/min (ref >60)
Glucose, Bld: 103 mg/dL — ABNORMAL HIGH (ref 65–99)
Potassium: 4.2 mmol/L (ref 3.5–5.1)
Sodium: 138 mmol/L (ref 135–145)
Total Bilirubin: 0.5 mg/dL (ref 0.3–1.2)
Total Protein: 6.2 g/dL — ABNORMAL LOW (ref 6.5–8.1)

## 2015-11-19 LAB — CBC
HCT: 37.7 % (ref 36.0–46.0)
HEMOGLOBIN: 11.8 g/dL — AB (ref 12.0–15.0)
MCH: 26.6 pg (ref 26.0–34.0)
MCHC: 31.3 g/dL (ref 30.0–36.0)
MCV: 84.9 fL (ref 78.0–100.0)
PLATELETS: 193 10*3/uL (ref 150–400)
RBC: 4.44 MIL/uL (ref 3.87–5.11)
RDW: 14.7 % (ref 11.5–15.5)
WBC: 4.1 10*3/uL (ref 4.0–10.5)

## 2015-11-19 NOTE — Care Management Important Message (Signed)
Important Message  Patient Details  Name: Gina Fritz MRN: [id number] Date of Birth: Dec 09, 1940   Medicare Important Message Given:  Yes    Nathen May 11/19/2015, 11:46 AM

## 2015-11-19 NOTE — Clinical Social Work Note (Signed)
Clinical Social Work Assessment  Patient Details  Name: Gina Fritz MRN: [id number] Date of Birth: September 10, 1940  Date of referral:  11/19/15               Reason for consult:  Facility Placement, Discharge Planning                Permission sought to share information with:  Facility Sport and exercise psychologist, Family Supports Permission granted to share information::  Yes, Verbal Permission Granted  Name::     Medical illustrator::  SNFs  Relationship::  Daughter  Contact Information:     Housing/Transportation Living arrangements for the past 2 months:  [place] of Information:  Patient, Adult Children Patient Interpreter Needed:  None Criminal Activity/Legal Involvement Pertinent to Current Situation/Hospitalization:  No - Comment as needed Significant Relationships:  Adult Children Lives with:  Self Do you feel safe going back to the place where you live?  Yes Need for family participation in patient care:  Yes (Comment)  Care giving concerns:  The patient and daughter agree with recommendation for SNF placement at this time as they do not feel the patient can manage at home alone at his time.   Social Worker assessment / plan:  CSW met with patient and daughter at bedside to complete assessment. The patient and daughter agree with recommendation for SNF placement. Both state that there is a preference for [facility] or [facility]. CSW explained SNF search/placement process and answered the patient's questions. CSW will followup with bed offers.  Employment status:  Retired Forensic scientist:  [employer] PT Recommendations:  [place] / Referral to community resources:  [place]  Patient/Family's Response to care:  The patient and family appear satisfied with the care the patient has received.  Patient/Family's Understanding of and Emotional Response to Diagnosis, Current Treatment, and Prognosis:  The patient and  daughter appear to have a good understanding of the reason for the patient's admission and post DC needs. The patient expresses great appreciation for CSW assistance.  Emotional Assessment Appearance:  Appears stated age Attitude/Demeanor/Rapport:  Other (Patient is welcoming and appropriate.) Affect (typically observed):  Accepting, Appropriate, Calm, Pleasant Orientation:  Oriented to Self, Oriented to Place, Oriented to  Time, Oriented to Situation Alcohol / Substance use:  Not Applicable Psych involvement (Current and /or in the community):  No (Comment)  Discharge Needs  Concerns to be addressed:  Discharge Planning Concerns, Care Coordination Readmission within the last 30 days:  No Current discharge risk:  Chronically ill, Physical Impairment Barriers to Discharge:  Continued Medical Work up   Fredderick Phenix B, LCSW 11/19/2015, 11:21 AM

## 2015-11-19 NOTE — Progress Notes (Signed)
Report received via Janett Billow RN in patient's room using SBAR format, reviewed orders, labs, VS, meds, tests, PMH and patient's general condition, assumed care of patient.

## 2015-11-19 NOTE — NC FL2 (Signed)
MEDICAID FL2 LEVEL OF CARE SCREENING TOOL     IDENTIFICATION  Patient Name: Gina Fritz Birthdate: 03-26-1941 Sex: female Admission Date (Current Location): 11/14/2015  [facility] and [place] Number:  Herbalist and Address:  [facility], [place] [address] [place], [place] [address]      Provider Number: [id number]  Attending Physician Name and Address:  Reyne Dumas, MD  Relative Name and Phone Number:       Current Level of Care: Hospital Recommended Level of Care: [place] Prior Approval Number:    Date Approved/Denied:   PASRR Number: [id number]  Discharge Plan: SNF    Current Diagnoses: Patient Active Problem List   Diagnosis Date Noted  . Headache 11/14/2015  . Intractable nausea and vomiting 11/14/2015  . Hypokalemia 11/14/2015  . Hypertensive urgency 11/14/2015  . 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 Malnutrition of moderate degree 02/09/2015  . Abdominal pain 02/05/2015  . Kidney stone on right side 02/05/2015  . Colitis 02/05/2015  . Dizziness and giddiness 04/22/2013  . Abnormality of gait 04/22/2013  . Dizziness     Orientation RESPIRATION BLADDER Height & Weight     Self, Time, Situation, Place  Normal Continent Weight: 53.3 kg (117 lb 8 oz) Height:  5\' 4"  (162.6 cm)  BEHAVIORAL SYMPTOMS/MOOD NEUROLOGICAL BOWEL NUTRITION STATUS   (NONE)  (NONE) Continent Diet (Regular, has a soy allergy.)  AMBULATORY STATUS COMMUNICATION OF NEEDS Skin   Extensive Assist Verbally Normal                       Personal Care Assistance Level of Assistance  Bathing, Feeding, Dressing Bathing Assistance: Limited assistance Feeding assistance: Independent Dressing Assistance: Limited assistance     Functional Limitations Info  Sight, Hearing, Speech Sight Info: Adequate Hearing Info: Adequate Speech Info: Adequate    SPECIAL CARE FACTORS FREQUENCY  PT  (By licensed PT), OT (By licensed OT)     PT Frequency: 5/week OT Frequency: 5/week            Contractures Contractures Info: Not present    Additional Factors Info  Code Status, Allergies, Psychotropic Code Status Info: Full Code Allergies Info: Penicillins, sulfa antibiotics, soy Psychotropic Info: Lexapro         Current Medications (11/19/2015):  This is the current hospital active medication list Current Facility-Administered Medications  Medication Dose Route Frequency Provider Last Rate Last Dose  . acetaminophen (TYLENOL) tablet 650 mg  650 mg Oral Q6H PRN Toy Baker, MD   650 mg at 11/19/15 0020   Or  . acetaminophen (TYLENOL) suppository 650 mg  650 mg Rectal Q6H PRN Toy Baker, MD      . albuterol (PROVENTIL) (2.5 MG/3ML) 0.083% nebulizer solution 2.5 mg  2.5 mg Inhalation Q6H PRN Toy Baker, MD      . amLODipine (NORVASC) tablet 10 mg  10 mg Oral Daily Domenic Polite, MD   10 mg at 11/19/15 0845  . atorvastatin (LIPITOR) tablet 40 mg  40 mg Oral q1800 Toy Baker, MD   40 mg at 11/18/15 1727  . enoxaparin (LOVENOX) injection 40 mg  40 mg Subcutaneous Q24H Greta Doom, MD   40 mg at 11/18/15 1934  . escitalopram (LEXAPRO) tablet 20 mg  20 mg Oral Daily Toy Baker, MD   20 mg at 11/19/15 0845  . feeding supplement (BOOST / RESOURCE BREEZE) liquid 1  Container  1 Container Oral TID BM Toy Baker, MD   1 Container at 11/18/15 1933  . fluticasone (FLONASE) 50 MCG/ACT nasal spray 2 spray  2 spray Each Nare Daily Toy Baker, MD   2 spray at 11/19/15 0846  . gabapentin (NEURONTIN) capsule 300 mg  300 mg Oral TID Greta Doom, MD   300 mg at 11/19/15 0845  . hydrALAZINE (APRESOLINE) injection 10 mg  10 mg Intravenous Q4H PRN Toy Baker, MD   10 mg at 11/16/15 1812  . hydrALAZINE (APRESOLINE) injection 5 mg  5 mg Intravenous Once Toy Baker, MD   Stopped at 11/14/15 2217  . losartan  (COZAAR) tablet 100 mg  100 mg Oral Daily Toy Baker, MD   100 mg at 11/19/15 0845  . mometasone-formoterol (DULERA) 100-5 MCG/ACT inhaler 2 puff  2 puff Inhalation BID Toy Baker, MD   2 puff at 11/19/15 1101  . montelukast (SINGULAIR) tablet 10 mg  10 mg Oral QHS Toy Baker, MD   10 mg at 11/18/15 2154  . ondansetron (ZOFRAN) tablet 4 mg  4 mg Oral Q6H PRN Toy Baker, MD   4 mg at 11/16/15 0942   Or  . ondansetron (ZOFRAN) injection 4 mg  4 mg Intravenous Q6H PRN Toy Baker, MD   4 mg at 11/16/15 0305  . pantoprazole (PROTONIX) EC tablet 40 mg  40 mg Oral Daily Domenic Polite, MD   40 mg at 11/19/15 0846  . polyethylene glycol (MIRALAX / GLYCOLAX) packet 17 g  1 packet Oral BID PRN Toy Baker, MD   17 g at 11/19/15 0845  . prochlorperazine (COMPAZINE) injection 10 mg  10 mg Intravenous Q6H PRN Toy Baker, MD   10 mg at 11/16/15 0500  . sodium chloride flush (NS) 0.9 % injection 3 mL  3 mL Intravenous Q12H Toy Baker, MD   3 mL at 11/18/15 2154  . zolpidem (AMBIEN) tablet 5 mg  5 mg Oral QHS PRN Domenic Polite, MD   5 mg at 11/17/15 2116     Discharge Medications: Please see discharge summary for a list of discharge medications.  Relevant Imaging Results:  Relevant Lab Results:   Additional Information SSN [id number]  Rigoberto Noel, LCSW

## 2015-11-19 NOTE — Progress Notes (Signed)
Unable to get a line in, was in the vein but blew when flushed, IV team called and will restart. Potassium level is 4.5, text paged to see if IVF's could be stopped at request of patient's family and orders received, will still get a new IV site since the other site was pulled at patient's request and continue to monitor.

## 2015-11-19 NOTE — Progress Notes (Signed)
Subjective: continus to be improved, felt "in a fog" yesterday.   Exam: Vitals:   11/19/15 1200 11/19/15 1610  BP: 131/68 (P) 129/67  Pulse: 73 (P) 76  Resp: 16 (P) 16  Temp: 98.5 F (36.9 C) (P) 98.3 F (36.8 C)   Gen: In bed, NAD Resp: non-labored breathing, no acute distress Abd: soft, nt  Neuro: MS: awake, alert, interactive.  YY:PEJYL, EOMI, there is no clear papilledema Motor: MAEW Sensory:intact to LT   Impression: 75 yo F with persistent headache. TA unlikely without marked elevation of CRP or ESR(30). 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   Recommendations: 1) d/c compazine/bendadryl 2) d/c toradol 3) continue gabapentin 358m TID  MRoland Rack MD [facility] [phone] If 7pm- 7am, please page neurology on call as listed in [place]

## 2015-11-19 NOTE — Progress Notes (Signed)
Patient has a running IV in Right forearm that has Potassium in it and patient is c/o pain at the site, attempted to flush IV but patient said she was having pain, looks like it;s slightly swollen, will attempt to get another IV site.

## 2015-11-20 DIAGNOSIS — N3281 Overactive bladder: Secondary | ICD-10-CM | POA: Diagnosis not present

## 2015-11-20 DIAGNOSIS — R278 Other lack of coordination: Secondary | ICD-10-CM | POA: Diagnosis not present

## 2015-11-20 DIAGNOSIS — R42 Dizziness and giddiness: Secondary | ICD-10-CM | POA: Diagnosis not present

## 2015-11-20 DIAGNOSIS — J45901 Unspecified asthma with (acute) exacerbation: Secondary | ICD-10-CM | POA: Diagnosis not present

## 2015-11-20 DIAGNOSIS — Q283 Other malformations of cerebral vessels: Secondary | ICD-10-CM | POA: Diagnosis not present

## 2015-11-20 DIAGNOSIS — E876 Hypokalemia: Secondary | ICD-10-CM | POA: Diagnosis not present

## 2015-11-20 DIAGNOSIS — E569 Vitamin deficiency, unspecified: Secondary | ICD-10-CM | POA: Diagnosis not present

## 2015-11-20 DIAGNOSIS — R52 Pain, unspecified: Secondary | ICD-10-CM | POA: Diagnosis not present

## 2015-11-20 DIAGNOSIS — T7840XA Allergy, unspecified, initial encounter: Secondary | ICD-10-CM | POA: Diagnosis not present

## 2015-11-20 DIAGNOSIS — K588 Other irritable bowel syndrome: Secondary | ICD-10-CM | POA: Diagnosis not present

## 2015-11-20 DIAGNOSIS — R112 Nausea with vomiting, unspecified: Secondary | ICD-10-CM | POA: Diagnosis not present

## 2015-11-20 DIAGNOSIS — I10 Essential (primary) hypertension: Secondary | ICD-10-CM | POA: Diagnosis not present

## 2015-11-20 DIAGNOSIS — R05 Cough: Secondary | ICD-10-CM | POA: Diagnosis not present

## 2015-11-20 DIAGNOSIS — G44011 Episodic cluster headache, intractable: Secondary | ICD-10-CM | POA: Diagnosis not present

## 2015-11-20 DIAGNOSIS — J45909 Unspecified asthma, uncomplicated: Secondary | ICD-10-CM | POA: Diagnosis not present

## 2015-11-20 DIAGNOSIS — G4452 New daily persistent headache (NDPH): Secondary | ICD-10-CM | POA: Diagnosis not present

## 2015-11-20 DIAGNOSIS — G43109 Migraine with aura, not intractable, without status migrainosus: Secondary | ICD-10-CM | POA: Diagnosis not present

## 2015-11-20 DIAGNOSIS — J432 Centrilobular emphysema: Secondary | ICD-10-CM | POA: Diagnosis not present

## 2015-11-20 DIAGNOSIS — J449 Chronic obstructive pulmonary disease, unspecified: Secondary | ICD-10-CM | POA: Diagnosis not present

## 2015-11-20 DIAGNOSIS — R569 Unspecified convulsions: Secondary | ICD-10-CM | POA: Diagnosis not present

## 2015-11-20 DIAGNOSIS — F329 Major depressive disorder, single episode, unspecified: Secondary | ICD-10-CM | POA: Diagnosis not present

## 2015-11-20 DIAGNOSIS — R51 Headache: Secondary | ICD-10-CM | POA: Diagnosis not present

## 2015-11-20 DIAGNOSIS — R5381 Other malaise: Secondary | ICD-10-CM | POA: Diagnosis not present

## 2015-11-20 DIAGNOSIS — Z79899 Other long term (current) drug therapy: Secondary | ICD-10-CM | POA: Diagnosis not present

## 2015-11-20 DIAGNOSIS — E785 Hyperlipidemia, unspecified: Secondary | ICD-10-CM | POA: Diagnosis not present

## 2015-11-20 DIAGNOSIS — G4709 Other insomnia: Secondary | ICD-10-CM | POA: Diagnosis not present

## 2015-11-20 DIAGNOSIS — K219 Gastro-esophageal reflux disease without esophagitis: Secondary | ICD-10-CM | POA: Diagnosis not present

## 2015-11-20 DIAGNOSIS — R262 Difficulty in walking, not elsewhere classified: Secondary | ICD-10-CM | POA: Diagnosis not present

## 2015-11-20 DIAGNOSIS — R2689 Other abnormalities of gait and mobility: Secondary | ICD-10-CM | POA: Diagnosis not present

## 2015-11-20 DIAGNOSIS — M6281 Muscle weakness (generalized): Secondary | ICD-10-CM | POA: Diagnosis not present

## 2015-11-20 DIAGNOSIS — E871 Hypo-osmolality and hyponatremia: Secondary | ICD-10-CM | POA: Diagnosis not present

## 2015-11-20 DIAGNOSIS — I160 Hypertensive urgency: Secondary | ICD-10-CM | POA: Diagnosis not present

## 2015-11-20 MED ORDER — GABAPENTIN 300 MG PO CAPS: 300.0000 mg | ORAL_CAPSULE | Freq: Three times a day (TID) | ORAL | 1 refills | Status: DC

## 2015-11-20 MED ORDER — AMLODIPINE BESYLATE 10 MG PO TABS: 10.0000 mg | ORAL_TABLET | Freq: Every day | ORAL | 1 refills | Status: DC

## 2015-11-20 MED ORDER — ACETAMINOPHEN 325 MG PO TABS: 650.0000 mg | ORAL_TABLET | Freq: Four times a day (QID) | ORAL | 1 refills | Status: AC | PRN

## 2015-11-20 MED ORDER — PANTOPRAZOLE SODIUM 40 MG PO TBEC: 40.0000 mg | DELAYED_RELEASE_TABLET | Freq: Every day | ORAL | 1 refills | Status: DC

## 2015-11-20 MED ORDER — LOSARTAN POTASSIUM 100 MG PO TABS: 100.0000 mg | ORAL_TABLET | Freq: Every day | ORAL | 1 refills | Status: DC

## 2015-11-20 NOTE — Discharge Summary (Addendum)
Physician Discharge Summary  Gina Fritz MRN: [id number] DOB/AGE: May 04, 1940 75 y.o.  PCP: Stephens Shire, MD   Admit date: 11/14/2015 Discharge date: 11/20/2015  Discharge Diagnoses:    Active Problems:   COPD with asthma ([place])   Headache   Intractable nausea and vomiting   Hypokalemia   Hypertensive urgency    Follow-up recommendations Follow-up with PCP in 3-5 days , including all  additional recommended appointments as below Follow-up CBC, CMP in 3-5 days Gabapentin can be discontinued and 7-10 days of the patient's headache resolves Patient would need outpatient neurology evaluation and constant follow-up      Current Discharge Medication List    START taking these medications   Details  amLODipine (NORVASC) 10 MG tablet Take 1 tablet (10 mg total) by mouth daily. Qty: 30 tablet, Refills: 1    gabapentin (NEURONTIN) 300 MG capsule Take 1 capsule (300 mg total) by mouth 3 (three) times daily. Qty: 90 capsule, Refills: 1    pantoprazole (PROTONIX) 40 MG tablet Take 1 tablet (40 mg total) by mouth daily. Qty: 30 tablet, Refills: 1      CONTINUE these medications which have CHANGED   Details  acetaminophen (TYLENOL) 325 MG tablet Take 2 tablets (650 mg total) by mouth every 6 (six) hours as needed for mild pain (or Fever >/= 101). Qty: 30 tablet, Refills: 1    losartan (COZAAR) 100 MG tablet Take 1 tablet (100 mg total) by mouth daily. Qty: 30 tablet, Refills: 1      CONTINUE these medications which have NOT CHANGED   Details  albuterol (PROAIR HFA) 108 (90 Base) MCG/ACT inhaler Inhale 2 puffs into the lungs every 6 (six) hours as needed for wheezing or shortness of breath. Qty: 1 Inhaler, Refills: 2    aspirin EC 81 MG tablet Take 81 mg by mouth daily.    atorvastatin (LIPITOR) 40 MG tablet Take 40 mg by mouth daily.    BESIVANCE 0.6 % SUSP Place 1 drop into the right eye 4 (four) times daily. The day before and the day of the dr visit     Cholecalciferol (VITAMIN D) 2000 UNITS CAPS Take 2,000 Units by mouth daily.    DONNATAL 16.2 MG tablet Take 16.2 tablets by mouth daily as needed (ibs).     escitalopram (LEXAPRO) 20 MG tablet Take 20 mg by mouth daily.     famotidine (PEPCID) 40 MG tablet Take 40 mg by mouth daily.    fluticasone (FLONASE) 50 MCG/ACT nasal spray Place 2 sprays into the nose daily.    montelukast (SINGULAIR) 10 MG tablet Take 10 mg by mouth daily.     Multiple Vitamin (MULTIVITAMIN WITH MINERALS) TABS Take 1 tablet by mouth daily.    Multiple Vitamins-Minerals (ICAPS AREDS 2 PO) Take 1 tablet by mouth 2 (two) times daily.    polyethylene glycol powder (GLYCOLAX/MIRALAX) powder Take 1 Container by mouth once.    promethazine (PHENERGAN) 12.5 MG suppository Place 12.5 mg rectally every 6 (six) hours as needed for nausea or vomiting.    ranitidine (ZANTAC) 300 MG tablet Take 300 mg by mouth 2 (two) times daily.    solifenacin (VESICARE) 10 MG tablet Take 10 mg by mouth daily.     sucralfate (CARAFATE) 1 GM/10ML suspension Take 1 g by mouth 4 (four) times daily.    mometasone-formoterol (DULERA) 100-5 MCG/ACT AERO Inhale 2 puffs into the lungs 2 (two) times daily. Qty: 1 Inhaler, Refills: 5    tamsulosin (FLOMAX) 0.4  MG CAPS capsule Take 1 capsule (0.4 mg total) by mouth daily after breakfast. Qty: 30 capsule, Refills: 2      STOP taking these medications     loperamide (IMODIUM) 2 MG capsule      losartan-hydrochlorothiazide (HYZAAR) 50-12.5 MG tablet      metoCLOPramide (REGLAN) 10 MG tablet          Discharge Condition:Stable    Discharge Instructions Get Medicines reviewed and adjusted: Please take all your medications with you for your next visit with your Primary MD  Please request your Primary MD to go over all hospital tests and procedure/radiological results at the follow up, please ask your Primary MD to get all Hospital records sent to his/her office.  If you  experience worsening of your admission symptoms, develop shortness of breath, life threatening emergency, suicidal or homicidal thoughts you must seek medical attention immediately by calling 911 or calling your MD immediately if symptoms less severe.  You must read complete instructions/literature along with all the possible adverse reactions/side effects for all the Medicines you take and that have been prescribed to you. Take any new Medicines after you have completely understood and accpet all the possible adverse reactions/side effects.   Do not drive when taking Pain medications.   Do not take more than prescribed Pain, Sleep and Anxiety Medications  Special Instructions: If you have smoked or chewed Tobacco in the last 2 yrs please stop smoking, stop any regular Alcohol and or any Recreational drug use.  Wear Seat belts while driving.  Please note  You were cared for by a hospitalist during your hospital stay. Once you are discharged, your primary care physician will handle any further medical issues. Please note that NO REFILLS for any discharge medications will be authorized once you are discharged, as it is imperative that you return to your primary care physician (or establish a relationship with a primary care physician if you do not have one) for your aftercare needs so that they can reassess your need for medications and monitor your lab values.     Allergies  Allergen Reactions  . Penicillins Anaphylaxis    Has patient had a PCN reaction causing immediate rash, facial/tongue/throat swelling, SOB or lightheadedness with hypotension: Yes Has patient had a PCN reaction causing severe rash involving mucus membranes or skin necrosis: Yes Has patient had a PCN reaction that required hospitalization Yes Has patient had a PCN reaction occurring within the last 10 years: No If all of the above answers are "NO", then may proceed with Cephalosporin use.   . Sulfa Antibiotics Hives   . Soy Allergy Itching and Rash      Disposition: SNF    Consults:  Neurology     Significant Diagnostic Studies:  Ct Angio Head W Or Wo Contrast  Result Date: 11/16/2015 CLINICAL DATA:  75 y/o F; patient reports headache with history of dizziness and nasal sinus surgery. EXAM: CT ANGIOGRAPHY HEAD AND NECK TECHNIQUE: Multidetector CT imaging of the head and neck was performed using the standard protocol during bolus administration of intravenous contrast. Multiplanar CT image reconstructions and MIPs were obtained to evaluate the vascular anatomy. Carotid stenosis measurements (when applicable) are obtained utilizing NASCET criteria, using the distal internal carotid diameter as the denominator. CONTRAST:  50 cc Isovue 370 COMPARISON:  MRI of the brain dated 11/16/2015, CT of the head dated 11/13/2015, and MRI of brain dated 05/06/2013. FINDINGS: CT HEAD Brain: No evidence of large acute infarct, mass effect,  or intracranial hemorrhage. 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 Mild chronic microvascular ischemic changes and parenchymal volume loss. Calvarium and skull base: No displaced skull fracture. No suspicious osseous lesion is identified. Paranasal sinuses: Visualized paranasal sinuses and mastoid air cells are clear. Orbits: Bilateral intra-ocular lens replacement. Otherwise the orbits are unremarkable. CTA NECK Aortic arch: Aberrant right subclavian artery. Three-vessel arch. Mild calcific aortic atherosclerosis. Right carotid system: No occlusion, aneurysm, dissection, or significant stenosis is identified. Mild calcifications at the bifurcation. Left carotid system: No occlusion, aneurysm, dissection, or significant stenosis is identified. Mild calcifications of the bifurcation. Vertebral arteries:No occlusion, aneurysm, dissection, or significant stenosis is identified. Left dominant system. Skeleton: Moderate multilevel cervical degenerative  changes greatest at the C5-6 level with there is disc space narrowing and marginal osteophytes. Moderate cervical facet arthrosis. No acute osseous abnormality is identified. Other neck: Thyroid nodules the largest in the right lobe measuring 14 x 12 mm. Salivary glands are unremarkable. No cervical lymphadenopathy. The aerodigestive tract is unremarkable. Nodules within the right lower neck subcutaneous fat and contact with the skin surface measuring up to 20 x 12 mm series 501, image 60 likely represent dermal appendage cysts. Upper chest: Moderate biapical pulmonary emphysema. Biapical scarring with calcifications. Pulmonary nodule in right upper lobe measuring 5 mm series 501, image 3. CTA HEAD Anterior circulation: No occlusion, aneurysm, dissection, or significant stenosis is identified. Mild calcific atherosclerosis of the right cavernous internal carotid artery. Posterior circulation: No occlusion, aneurysm, dissection, or significant stenosis is identified. Proximal basilar fenestration. Venous sinuses: Patent. 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 Anatomic variants: Patent bilateral posterior communicating arteries. No anterior communicating artery is identified. Delayed phase: No abnormal enhancement. IMPRESSION: 1. No large vessel occlusion, aneurysm, dissection, or significant stenosis of the circle of Willis or the carotid and vertebral arteries of the neck. 2. Moderate pulmonary emphysema. 3. **An incidental finding of potential clinical significance has been found. No follow-up needed if patient is low-risk. Non-contrast chest CT can be considered in 12 months if patient is high-risk. This recommendation follows the consensus statement: Guidelines for Management of Incidental Pulmonary Nodules Detected on CT Images:From the Fleischner Society 2017; published online before print (10.1148/[id number]).** Electronically Signed   By: Kristine Garbe M.D.   On:  11/16/2015 22:34   Ct Head Wo Contrast  Result Date: 11/13/2015 CLINICAL DATA:  75 year old female with a history of frontal headache and hypertension EXAM: CT HEAD WITHOUT CONTRAST TECHNIQUE: Contiguous axial images were obtained from the base of the skull through the vertex without intravenous contrast. COMPARISON:  MRI 05/06/1998 51 FINDINGS: Unremarkable appearance of the calvarium without acute fracture or aggressive lesion. Unremarkable appearance of the scalp soft tissues. Unremarkable appearance of the bilateral orbits. Surgical changes of right maxillary antrectomy. Trace mucosal disease of the right maxillary antrum. Mastoid air cells are clear. No significant paranasal sinus disease No acute intracranial hemorrhage. No midline shift. No mass effect. Gray-white differentiation maintained. Vague hypodensity in the centrum semi of al of the left hemisphere lateral to the posterior lateral ventricle, was present on the prior MR. 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 IMPRESSION: No CT evidence of acute intracranial abnormality. Signed, Dulcy Fanny. Earleen Newport, DO [facility] Electronically Signed   By: Corrie Mckusick D.O.   On: 11/13/2015 14:43   Ct Angio Neck W Or Wo Contrast  Result Date: 11/16/2015 CLINICAL DATA:  75 y/o F; patient reports headache  with history of dizziness and nasal sinus surgery. EXAM: CT ANGIOGRAPHY HEAD AND NECK TECHNIQUE: Multidetector CT imaging of the head and neck was performed using the standard protocol during bolus administration of intravenous contrast. Multiplanar CT image reconstructions and MIPs were obtained to evaluate the vascular anatomy. Carotid stenosis measurements (when applicable) are obtained utilizing NASCET criteria, using the distal internal carotid diameter as the denominator. CONTRAST:  50 cc Isovue 370 COMPARISON:  MRI of the brain dated 11/16/2015, CT of the head  dated 11/13/2015, and MRI of brain dated 05/06/2013. FINDINGS: CT HEAD Brain: No evidence of large acute infarct, mass effect, or intracranial hemorrhage. 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 Mild chronic microvascular ischemic changes and parenchymal volume loss. Calvarium and skull base: No displaced skull fracture. No suspicious osseous lesion is identified. Paranasal sinuses: Visualized paranasal sinuses and mastoid air cells are clear. Orbits: Bilateral intra-ocular lens replacement. Otherwise the orbits are unremarkable. CTA NECK Aortic arch: Aberrant right subclavian artery. Three-vessel arch. Mild calcific aortic atherosclerosis. Right carotid system: No occlusion, aneurysm, dissection, or significant stenosis is identified. Mild calcifications at the bifurcation. Left carotid system: No occlusion, aneurysm, dissection, or significant stenosis is identified. Mild calcifications of the bifurcation. Vertebral arteries:No occlusion, aneurysm, dissection, or significant stenosis is identified. Left dominant system. Skeleton: Moderate multilevel cervical degenerative changes greatest at the C5-6 level with there is disc space narrowing and marginal osteophytes. Moderate cervical facet arthrosis. No acute osseous abnormality is identified. Other neck: Thyroid nodules the largest in the right lobe measuring 14 x 12 mm. Salivary glands are unremarkable. No cervical lymphadenopathy. The aerodigestive tract is unremarkable. Nodules within the right lower neck subcutaneous fat and contact with the skin surface measuring up to 20 x 12 mm series 501, image 60 likely represent dermal appendage cysts. Upper chest: Moderate biapical pulmonary emphysema. Biapical scarring with calcifications. Pulmonary nodule in right upper lobe measuring 5 mm series 501, image 3. CTA HEAD Anterior circulation: No occlusion, aneurysm, dissection, or significant stenosis is identified. Mild calcific  atherosclerosis of the right cavernous internal carotid artery. Posterior circulation: No occlusion, aneurysm, dissection, or significant stenosis is identified. Proximal basilar fenestration. Venous sinuses: Patent. 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 Anatomic variants: Patent bilateral posterior communicating arteries. No anterior communicating artery is identified. Delayed phase: No abnormal enhancement. IMPRESSION: 1. No large vessel occlusion, aneurysm, dissection, or significant stenosis of the circle of Willis or the carotid and vertebral arteries of the neck. 2. Moderate pulmonary emphysema. 3. **An incidental finding of potential clinical significance has been found. No follow-up needed if patient is low-risk. Non-contrast chest CT can be considered in 12 months if patient is high-risk. This recommendation follows the consensus statement: Guidelines for Management of Incidental Pulmonary Nodules Detected on CT Images:From the Fleischner Society 2017; published online before print (10.1148/[id number]).** Electronically Signed   By: Kristine Garbe M.D.   On: 11/16/2015 22:34   Mr Brain Wo Contrast  Result Date: 11/16/2015 CLINICAL DATA:  Frontal headache for 1 week, vomiting. Hypertensive. EXAM: MRI HEAD WITHOUT CONTRAST TECHNIQUE: Multiplanar, multiecho pulse sequences of the brain and surrounding structures were obtained without intravenous contrast. COMPARISON:  CT HEAD November 13, 2015 and MRI head May 06, 2013 FINDINGS: INTRACRANIAL CONTENTS: No reduced diffusion to suggest acute ischemia. No susceptibility artifact to suggest hemorrhage. The ventricles and sulci are normal for patient's age. No suspicious parenchymal signal, masses or mass effect. No abnormal extra-axial fluid collections. Mild stable supratentorial and pontine white  matter changes compatible with chronic small vessel ischemic disease. Two adjacent RIGHT frontal developmental venous  anomalies, unchanged. No extra-axial masses though, contrast enhanced sequences would be more sensitive. Normal major intracranial vascular flow voids present at skull base. ORBITS: The included ocular globes and orbital contents are non-suspicious. Status post bilateral ocular lens implants. SINUSES: The mastoid air-cells and included paranasal sinuses are well-aerated. SKULL/SOFT TISSUES: No abnormal sellar expansion. No suspicious calvarial bone marrow signal. Craniocervical junction maintained. Patient is edentulous. IMPRESSION: No acute intracranial process. Stable appearance of the head from prior MRI including 2 adjacent RIGHT frontal developmental venous anomalies. Electronically Signed   By: Elon Alas M.D.   On: 11/16/2015 21:47       Filed Weights   11/18/15 0417 11/19/15 0351 11/20/15 0600  Weight: 53.7 kg (118 lb 6.4 oz) 53.3 kg (117 lb 8 oz) 53.8 kg (118 lb 8 oz)     Microbiology: Recent Results (from the past 240 hour(s))  MRSA PCR Screening     Status: None   Collection Time: 11/17/15  9:29 AM  Result Value Ref Range Status   MRSA by PCR NEGATIVE NEGATIVE Final    Comment:        The GeneXpert MRSA Assay (FDA approved for NASAL specimens only), is one component of a comprehensive MRSA colonization surveillance program. It is not intended to diagnose MRSA infection nor to guide or monitor treatment for MRSA infections.        Blood Culture    Component Value Date/Time   SDES URINE, CLEAN CATCH 02/05/2015 1339   SPECREQUEST NONE 02/05/2015 1339   CULT  02/05/2015 1339    60,000 COLONIES/ml DIPHTHEROIDS(CORYNEBACTERIUM SPECIES) Performed at [facility] 02/07/2015 FINAL 02/05/2015 1339      Labs: Results for orders placed or performed during the hospital encounter of 11/14/15 (from the past 48 hour(s))  CBC     Status: Abnormal   Collection Time: 11/19/15  3:55 AM  Result Value Ref Range   WBC 4.1 4.0 - 10.5 K/uL   RBC 4.44  3.87 - 5.11 MIL/uL   Hemoglobin 11.8 (L) 12.0 - 15.0 g/dL   HCT 37.7 36.0 - 46.0 %   MCV 84.9 78.0 - 100.0 fL   MCH 26.6 26.0 - 34.0 pg   MCHC 31.3 30.0 - 36.0 g/dL   RDW 14.7 11.5 - 15.5 %   Platelets 193 150 - 400 K/uL  Comprehensive metabolic panel     Status: Abnormal   Collection Time: 11/19/15  3:55 AM  Result Value Ref Range   Sodium 138 135 - 145 mmol/L   Potassium 4.2 3.5 - 5.1 mmol/L   Chloride 102 101 - 111 mmol/L   CO2 28 22 - 32 mmol/L   Glucose, Bld 103 (H) 65 - 99 mg/dL   BUN 14 6 - 20 mg/dL   Creatinine, Ser 0.79 0.44 - 1.00 mg/dL   Calcium 9.5 8.9 - 10.3 mg/dL   Total Protein 6.2 (L) 6.5 - 8.1 g/dL   Albumin 3.4 (L) 3.5 - 5.0 g/dL   AST 36 15 - 41 U/L   ALT 29 14 - 54 U/L   Alkaline Phosphatase 109 38 - 126 U/L   Total Bilirubin 0.5 0.3 - 1.2 mg/dL   GFR calc non Af Amer >60 >60 mL/min   GFR calc Af Amer >60 >60 mL/min    Comment: (NOTE) The eGFR has been calculated using the CKD EPI equation. This calculation has not  been validated in all clinical situations. eGFR's persistently <60 mL/min signify possible Chronic Kidney Disease.    Anion gap 8 5 - 15     Lipid Panel  No results found for: CHOL, TRIG, HDL, CHOLHDL, VLDL, LDLCALC, LDLDIRECT   Lab Results  Component Value Date   HGBA1C 4.8 11/17/2015     Lab Results  Component Value Date   CREATININE 0.79 11/19/2015     Gina P Strulsonis a 75 y.o.femalewith medical history significant ofCOPD, hypertension, irritable bowel syndrome, Migrainewithout headaches.  Presented with headache nausea vomiting for the past 8days she has been seen for this in emergency department 3 times starting on 8/06- Headache seemed to come together with nausea, no diarrhea. discharged home after supportive care, Ct head and other workup was negative - She returned her symptoms resumed was taken to [place] on 08/09 Ct with mild gastric mucosal thickening, started on H2 blocker CT head and MRV negative.  At  [facility] she was restarted on losartan 50 mg  -8/10 she was seen by PCP and changed to Hyzaar 50 -12.5. Her headache she attempted to use Tylenol but it did not seem to help, Her nausea and headache has persisted episodic hypertension . Again seen in ER 8/11, Rx with hydralazine 5 mg Compazine 10 mg and given 1 L of normal saline.He seemed better after IV fluids and medications CT scan of the head was done again and was negative she was discharged home with Reglan and Tylenol when necessary headaches. -Came back again on 8/12 due to ongoing HA and vomiting despite Phenergan. Workup negative so far, suspect Status migranous, neurology consulted, intermittently better and then HA worsens again, plan for Depakote today  Hospital course   Headache/status migrainous -4th ER visit in 6days for same -Multiple CT heads here and MRV at [facility] med negative -CT abd pelvis on 8/9 with possible mild gastric mucosal thickening otherwise negative -labs unremarkable, no evidence of infection, no fevers -TSH ok, Random cortisol low  Received Scheduled compazine/benadryl , completed at 4 AM  on 8/17 Was also given IV Toradol Neurology started patient on  gabapentin 381m TID on 8/16. Patient has done well on this regimen -MRI negative, no indication for lumbar puncture Neurology ruled out temporal arteritis, ESR 30 Okay for discharge from a neurology standpoint  Mild hyperglycemia Hemoglobin A1c 4.8  COPD -stable, nebs PRN  HTN -continue Losartan, added amlodipine, HCTZ discontinued -hydralazine PRN given as inpatient  Hypokalemia -due to hyzaar, replaced  Depression -on lexapro, long term     Discharge Exam:   Blood pressure 128/71, pulse 63, temperature 98 F (36.7 C), temperature source Oral, resp. rate 16, height 5' 4"  (1.626 m), weight 53.8 kg (118 lb 8 oz), SpO2 97 %.  General exam: frail, chronically ill appearing, wet towel on forehead Respiratory system: Clear to  auscultation. Respiratory effort normal. Cardiovascular system: S1 & S2 heard, RRR. No JVD, murmurs, rubs, gallops or clicks. No pedal edema. Gastrointestinal system: Abdomen is nondistended, soft and nontender. No organomegaly or masses felt. Normal bowel sounds heard. Central nervous system: Alert and oriented. No focal neurological deficits. Extremities: Symmetric 5 x 5 power. Skin: No rashes, lesions or ulcers Psychiatry: Judgement and insight appear normal. Flat affect     [facility]-up Information    [facility]   Why:  HHRN, HHPT, HHa[facility] Contact information: [address] [place] [phone]       BStephens Shire MD. Schedule an  appointment as soon as possible for a visit in 2 day(s).   Specialty:  Family Medicine Why:  Hospital follow-up Contact information: [address] [place] [address] [phone]           Signed: Reyne Dumas 11/20/2015, 8:30 AM        Time spent >45 mins

## 2015-11-20 NOTE — Clinical Social Work Placement (Signed)
   CLINICAL SOCIAL WORK PLACEMENT  NOTE  Date:  11/20/2015  Patient Details  Name: Gina Fritz MRN: [id number] Date of Birth: 1941/03/05  Clinical Social Work is seeking post-discharge placement for this patient at the [place] level of care (*CSW will initial, date and re-position this form in  chart as items are completed):  Yes   Patient/family provided with [facility] Department's list of facilities offering this level of care within the geographic area requested by the patient (or if unable, by the patient's family).  Yes   Patient/family informed of their freedom to choose among providers that offer the needed level of care, that participate in Medicare, Medicaid or managed care program needed by the patient, have an available bed and are willing to accept the patient.  Yes   Patient/family informed of [facility]'s ownership interest in [facility] and [facility], as well as of the fact that they are under no obligation to receive care at these facilities.  PASRR submitted to EDS on 11/20/15     PASRR number received on 11/20/15     Existing PASRR number confirmed on       FL2 transmitted to all facilities in geographic area requested by pt/family on 11/20/15     FL2 transmitted to all facilities within larger geographic area on       Patient informed that his/her managed care company has contracts with or will negotiate with certain facilities, including the following:        Yes   Patient/family informed of bed offers received.  Patient chooses bed at [facility]     Physician recommends and patient chooses bed at      Patient to be transferred to [facility] on 11/20/15.  Patient to be transferred to facility by Ambulance     Patient family notified on 11/20/15 of transfer.  Name of family member notified:  Sarah     PHYSICIAN Please prepare priority discharge summary, including medications, Please prepare  prescriptions, Please sign FL2     Additional Comment:  Per MD patient ready for DC to [facility]. RN, patient, patient's family, and facility notified of DC. RN given number for report. DC packet on chart. Ambulance transport requested for patient. CSW signing off.   _______________________________________________ Rigoberto Noel, LCSW 11/20/2015, 1:49 PM

## 2015-11-20 NOTE — Care Management Note (Signed)
Case Management Note  Patient Details  Name: Gina Fritz MRN: [id number] Date of Birth: 1941/02/03  Subjective/Objective:    Patient is for discharge to SNF today, CSW following.                Action/Plan:   Expected Discharge Date:                  Expected Discharge Plan:  Skilled Nursing Facility  In-House Referral:  Clinical Social Work  Discharge planning Services  CM Consult  Post Acute Care Choice:    Choice offered to:  Patient  DME Arranged:  Bedside commode DME Agency:     HH Arranged:    [place] Agency:     Status of Service:  Completed, signed off  If discussed at [employer], dates discussed:    Additional Comments:  [name], RN 11/20/2015, 1:34 PM

## 2015-11-20 NOTE — Progress Notes (Signed)
Report called to [facility] with no further questions at this time. Patient to discharge via [facility] to [facility]. Will continue to monitor.

## 2015-11-21 ENCOUNTER — Encounter: Payer: Self-pay | Admitting: Geriatric Medicine

## 2015-11-22 DIAGNOSIS — R112 Nausea with vomiting, unspecified: Secondary | ICD-10-CM | POA: Diagnosis not present

## 2015-11-22 DIAGNOSIS — R5381 Other malaise: Secondary | ICD-10-CM | POA: Diagnosis not present

## 2015-11-22 DIAGNOSIS — J449 Chronic obstructive pulmonary disease, unspecified: Secondary | ICD-10-CM | POA: Diagnosis not present

## 2015-11-22 DIAGNOSIS — I10 Essential (primary) hypertension: Secondary | ICD-10-CM | POA: Diagnosis not present

## 2015-11-22 DIAGNOSIS — R51 Headache: Secondary | ICD-10-CM | POA: Diagnosis not present

## 2015-11-23 ENCOUNTER — Encounter: Payer: Self-pay | Admitting: Pulmonary Disease

## 2015-11-23 ENCOUNTER — Ambulatory Visit (INDEPENDENT_AMBULATORY_CARE_PROVIDER_SITE_OTHER): Payer: Medicare Other | Admitting: Pulmonary Disease

## 2015-11-23 ENCOUNTER — Encounter (INDEPENDENT_AMBULATORY_CARE_PROVIDER_SITE_OTHER): Payer: Medicare Other | Admitting: Ophthalmology

## 2015-11-23 VITALS — BP 118/70 | HR 65 | Ht 62.0 in | Wt 117.6 lb

## 2015-11-23 DIAGNOSIS — E785 Hyperlipidemia, unspecified: Secondary | ICD-10-CM | POA: Diagnosis not present

## 2015-11-23 DIAGNOSIS — R058 Other specified cough: Secondary | ICD-10-CM

## 2015-11-23 DIAGNOSIS — J449 Chronic obstructive pulmonary disease, unspecified: Secondary | ICD-10-CM

## 2015-11-23 DIAGNOSIS — J45909 Unspecified asthma, uncomplicated: Secondary | ICD-10-CM | POA: Diagnosis not present

## 2015-11-23 DIAGNOSIS — J432 Centrilobular emphysema: Secondary | ICD-10-CM

## 2015-11-23 DIAGNOSIS — I10 Essential (primary) hypertension: Secondary | ICD-10-CM | POA: Diagnosis not present

## 2015-11-23 DIAGNOSIS — R05 Cough: Secondary | ICD-10-CM

## 2015-11-23 NOTE — Patient Instructions (Signed)
Try nasal irrigation daily before using flonase Can use mucinex 1200 mg twice per day as needed for cough and congestion  Follow up in 3 months

## 2015-11-23 NOTE — Progress Notes (Signed)
Current Outpatient Prescriptions on File Prior to Visit  Medication Sig  . acetaminophen (TYLENOL) 325 MG tablet Take 2 tablets (650 mg total) by mouth every 6 (six) hours as needed for mild pain (or Fever >/= 101).  Marland Kitchen albuterol (PROAIR HFA) 108 (90 Base) MCG/ACT inhaler Inhale 2 puffs into the lungs every 6 (six) hours as needed for wheezing or shortness of breath.  Marland Kitchen amLODipine (NORVASC) 10 MG tablet Take 1 tablet (10 mg total) by mouth daily.  Marland Kitchen aspirin EC 81 MG tablet Take 81 mg by mouth daily.  Marland Kitchen atorvastatin (LIPITOR) 40 MG tablet Take 40 mg by mouth daily.  Marland Kitchen BESIVANCE 0.6 % SUSP Place 1 drop into the right eye 4 (four) times daily. The day before and the day of the dr visit  . Cholecalciferol (VITAMIN D) 2000 UNITS CAPS Take 2,000 Units by mouth daily.  . DONNATAL 16.2 MG tablet Take 16.2 tablets by mouth daily as needed (ibs).   Marland Kitchen escitalopram (LEXAPRO) 20 MG tablet Take 20 mg by mouth daily.   . famotidine (PEPCID) 40 MG tablet Take 40 mg by mouth daily.  . fluticasone (FLONASE) 50 MCG/ACT nasal spray Place 2 sprays into the nose daily.  Marland Kitchen gabapentin (NEURONTIN) 300 MG capsule Take 1 capsule (300 mg total) by mouth 3 (three) times daily.  Marland Kitchen losartan (COZAAR) 100 MG tablet Take 1 tablet (100 mg total) by mouth daily.  . mometasone-formoterol (DULERA) 100-5 MCG/ACT AERO Inhale 2 puffs into the lungs 2 (two) times daily. (Patient not taking: Reported on 11/13/2015)  . montelukast (SINGULAIR) 10 MG tablet Take 10 mg by mouth daily.   . Multiple Vitamin (MULTIVITAMIN WITH MINERALS) TABS Take 1 tablet by mouth daily.  . Multiple Vitamins-Minerals (ICAPS AREDS 2 PO) Take 1 tablet by mouth 2 (two) times daily.  . pantoprazole (PROTONIX) 40 MG tablet Take 1 tablet (40 mg total) by mouth daily.  . polyethylene glycol powder (GLYCOLAX/MIRALAX) powder Take 1 Container by mouth once.  . promethazine (PHENERGAN) 12.5 MG suppository Place 12.5 mg rectally every 6 (six) hours as needed for nausea or  vomiting.  . ranitidine (ZANTAC) 300 MG tablet Take 300 mg by mouth 2 (two) times daily.  . solifenacin (VESICARE) 10 MG tablet Take 10 mg by mouth daily.   . sucralfate (CARAFATE) 1 GM/10ML suspension Take 1 g by mouth 4 (four) times daily.  . tamsulosin (FLOMAX) 0.4 MG CAPS capsule Take 1 capsule (0.4 mg total) by mouth daily after breakfast. (Patient not taking: Reported on 07/23/2015)   No current facility-administered medications on file prior to visit.      Chief Complaint  Patient presents with  . Follow-up    Pt recently graduated from [facility] and has started in the [facility] but was discharged d/t being sick and missing 2 weeks. Pt having mucus production since having PNA. Denies cough, wheezing and SOB. Pt is still not smoking!     Tests CT chest 02/05/15 >> moderate emphysema PFT 05/22/15 >> FEV1 1.54 (78%), FEV1% 70, TLC 6.22 (130%), DLCO 46%, no BD  Past medical hx HTN, Depression, IBS, Tinnitus, Anxiety, Nephrolithiasis, GERD, HLD  Past surgical hx, Allergies, Family hx, Social hx all reviewed.  Vital Signs BP 118/70 (BP Location: Left Arm, Cuff Size: Normal)   Pulse 65   Ht 5\' 2"  (1.575 m)   Wt 117 lb 9.6 oz (53.3 kg)   SpO2 99%   BMI 21.51 kg/m   History of Present Illness Gina Fritz  is a 75 y.o. female former smoker with COPD/emphysema and asthma.  She was recently tx for pneumonia.  Slowly improving.  She still feels to weak to go back to pulmonary rehab.  She still has cough with clear sputum.  She is having sinus congestion and post nasal drip.  Physical Exam  General - No distress, thin ENT - No sinus tenderness, no oral exudate, no LAN Cardiac - s1s2 regular, no murmur Chest - No wheeze/rales/dullness Back - No focal tenderness Abd - Soft, non-tender Ext - No edema Neuro - Normal strength Skin - No rashes Psych - normal mood, and behavior   Assessment/Plan  COPD with emphysema and asthma. - continue dulera, singulair with  prn albuterol - resume pulmonary rehab maintenance classes when she is recovered fully from recent episode of pneumonia  Upper airway cough syndrome. - can try nasal irrigation, flonase - can use prn mucinex to help with cough and expectoration   Patient Instructions  Try nasal irrigation daily before using flonase Can use mucinex 1200 mg twice per day as needed for cough and congestion  Follow up in 3 months   Chesley Mires, MD [facility] Pager:  [phone] 11/25/2015, 2:41 PM

## 2015-11-24 ENCOUNTER — Encounter (HOSPITAL_COMMUNITY): Payer: Medicare Other

## 2015-11-26 ENCOUNTER — Encounter (HOSPITAL_COMMUNITY): Payer: Medicare Other

## 2015-11-30 DIAGNOSIS — I160 Hypertensive urgency: Secondary | ICD-10-CM | POA: Diagnosis not present

## 2015-11-30 DIAGNOSIS — R51 Headache: Secondary | ICD-10-CM | POA: Diagnosis not present

## 2015-11-30 DIAGNOSIS — Q283 Other malformations of cerebral vessels: Secondary | ICD-10-CM | POA: Diagnosis not present

## 2015-11-30 DIAGNOSIS — R112 Nausea with vomiting, unspecified: Secondary | ICD-10-CM | POA: Diagnosis not present

## 2015-11-30 DIAGNOSIS — G43109 Migraine with aura, not intractable, without status migrainosus: Secondary | ICD-10-CM | POA: Diagnosis not present

## 2015-11-30 DIAGNOSIS — R569 Unspecified convulsions: Secondary | ICD-10-CM | POA: Diagnosis not present

## 2015-11-30 DIAGNOSIS — R42 Dizziness and giddiness: Secondary | ICD-10-CM | POA: Diagnosis not present

## 2015-11-30 DIAGNOSIS — I10 Essential (primary) hypertension: Secondary | ICD-10-CM | POA: Diagnosis not present

## 2015-11-30 NOTE — Addendum Note (Signed)
Encounter addended by: Lance Morin, RN on: 11/30/2015  1:35 PM<BR>    Actions taken: Sign clinical note

## 2015-11-30 NOTE — Addendum Note (Signed)
Encounter addended by: Lance Morin, RN on: 11/30/2015  1:37 PM<BR>    Actions taken: Episode resolved

## 2015-11-30 NOTE — Progress Notes (Signed)
Discharge Summary  Patient Details  Name: Gina Fritz MRN: [id number] Date of Birth: 1940/12/04 Referring Provider:     Number of Visits: 24  Reason for Discharge:  Patient reached a stable level of exercise.  Smoking History:  History  Smoking Status  . Former Smoker  . Packs/day: 1.00  . Years: 25.00  . Types: Cigarettes  . Quit date: 02/05/2015  Smokeless Tobacco  . Never Used    Diagnosis:  Pulmonary emphysema, unspecified emphysema type ([place])  ADL UCSD:   Initial Exercise Prescription:     Initial Exercise Prescription - 07/09/15 0800      Date of Initial Exercise RX and Referring Provider   Date 07/09/15     Oxygen   Oxygen --  room air     Bike   Level 0.2   Minutes 15     NuStep   Level 1   Minutes 15   METs 1.5     Track   Laps 5   Minutes 15     Prescription Details   Frequency (times per week) 2   Duration Progress to 45 minutes of aerobic exercise without signs/symptoms of physical distress     Intensity   THRR 40-80% of Max Heartrate 58-117   Ratings of Perceived Exertion 11-13   Perceived Dyspnea 0-4     Progression   Progression Continue to progress workloads to maintain intensity without signs/symptoms of physical distress.     Resistance Training   Training Prescription Yes   Weight orange bands   Reps 10-12      Discharge Exercise Prescription (Final Exercise Prescription Changes):     Exercise Prescription Changes - 10/15/15 1600      Response to Exercise   Blood Pressure (Admit) 108/68   Blood Pressure (Exercise) 122/60   Blood Pressure (Exit) 130/60   Heart Rate (Admit) 62 bpm   Heart Rate (Exercise) 81 bpm   Heart Rate (Exit) 60 bpm   Oxygen Saturation (Admit) 97 %   Oxygen Saturation (Exercise) 95 %   Oxygen Saturation (Exit) 95 %   Rating of Perceived Exertion (Exercise) 13   Perceived Dyspnea (Exercise) 1   Duration Progress to 45 minutes of aerobic exercise without signs/symptoms of physical  distress   Intensity THRR unchanged     Progression   Progression Continue to progress workloads to maintain intensity without signs/symptoms of physical distress.     Resistance Training   Training Prescription Yes   Weight orange bands   Reps 10-12     Interval Training   Interval Training No     NuStep   Level 4   Minutes 17   METs 1.8     Track   Laps 9   Minutes 17      Functional Capacity:     6 Minute Walk    Row Name 07/09/15 0758 10/27/15 1603       6 Minute Walk   Phase Initial Discharge    Distance 678 feet 1150 feet    Walk Time 6 minutes 6 minutes    # of Rest Breaks 0 0    MPH 1.28 2.17    METS 1.73 2.67    RPE 7 11    Perceived Dyspnea  0 1    VO2 Peak 6.08  -    Symptoms No No    Resting HR 80 bpm 67 bpm    Resting BP 138/78 124/64    Max Ex. HR 96  bpm 93 bpm    Max Ex. BP 120/80 112/82    2 Minute Post BP 120/80  -      Interval HR   Baseline HR 80 67    1 Minute HR 83 84    2 Minute HR 81 84    3 Minute HR 82 93    4 Minute HR 96 85    5 Minute HR 90 80    6 Minute HR 91 78    2 Minute Post HR 65 65    Interval Heart Rate? Yes Yes      Interval Oxygen   Interval Oxygen? Yes Yes    Baseline Oxygen Saturation % 98 % 94 %    Baseline Liters of Oxygen 0 L 0 L    1 Minute Oxygen Saturation % 97 % 93 %    1 Minute Liters of Oxygen 0 L 0 L    2 Minute Oxygen Saturation % 97 % 93 %    2 Minute Liters of Oxygen 0 L 0 L    3 Minute Oxygen Saturation % 96 % 90 %    3 Minute Liters of Oxygen 0 L 0 L    4 Minute Oxygen Saturation % 96 % 93 %    4 Minute Liters of Oxygen 0 L 0 L    5 Minute Oxygen Saturation % 94 % 88 %    5 Minute Liters of Oxygen 0 L 0 L    6 Minute Oxygen Saturation % 96 % 89 %    6 Minute Liters of Oxygen 0 L 0 L    2 Minute Post Oxygen Saturation % 98 % 95 %    2 Minute Post Liters of Oxygen 0 L 0 L       Psychological, QOL, Others - Outcomes: PHQ 2/9: Depression screen [facility] 2/9 10/27/2015 07/06/2015  Decreased  Interest 0 0  Down, Depressed, Hopeless 0 1  PHQ - 2 Score 0 1    Quality of Life:   Personal Goals: Goals established at orientation with interventions provided to work toward goal.     Personal Goals and Risk Factors at Admission - 07/06/15 1032      Core Components/Risk Factors/Patient Goals on Admission   Increase Strength and Stamina Yes   Intervention Provide advice, education, support and counseling about physical activity/exercise needs.;Develop an individualized exercise prescription for aerobic and resistive training based on initial evaluation findings, risk stratification, comorbidities and participant's personal goals.   Expected Outcomes Achievement of increased cardiorespiratory fitness and enhanced flexibility, muscular endurance and strength shown through measurements of functional capacity and personal statement of participant.   Improve shortness of breath with ADL's Yes   Intervention Provide education, individualized exercise plan and daily activity instruction to help decrease symptoms of SOB with activities of daily living.   Expected Outcomes Short Term: Achieves a reduction of symptoms when performing activities of daily living.   Intervention Provide education, demonstration and support about specific breathing techniuqes utilized for more efficient breathing. Include techniques such as pursed lipped breathing, diaphragmatic breathing and self-pacing activity.   Expected Outcomes Short Term: Participant will be able to demonstrate and use breathing techniques as needed throughout daily activities.   Personal Goal Other Yes   Personal Goal To be able to have the energy to do more house work and gardening.     Intervention Increase workloads on equipment as tolerated to increase her strength and stamina   Expected Outcomes  To have the energy to perform ADL's at home.       Personal Goals Discharge:     Goals and Risk Factor Review    Row Name 07/06/15 1043  08/18/15 1639 09/10/15 1121 10/12/15 1250       Core Components/Risk Factors/Patient Goals Review   Personal Goals Review Increase Strength and Stamina;Develop more efficient breathing techniques such as purse lipped breathing and diaphragmatic breathing and practicing self-pacing with activity.;Improve shortness of breath with ADL's  -  - Increase Strength and Stamina;Improve shortness of breath with ADL's;Develop more efficient breathing techniques such as purse lipped breathing and diaphragmatic breathing and practicing self-pacing with activity.    Review  - -  she has attended 10 exercise sessions and has noticed a definite increase in her strength and stamina,. Improvement in strength and stamina, still is not exercising at home, encouraged that.  Positive outlook with exercise.Purse lip breathing technique is excellent. Will graduate in 2 more exercise sessions.  Wants to participate in pulmonary maintenance after graduation.  Maxed out @ .5 on airdyne bike, this is particularly hard for [place].  9 laps on track, and level 4 on nustep.      Expected Outcomes  - further improvement in strength and stamina by increasing workloads, purse lip breathing is improving Increased strength and stamina as workloads are increased as tolerated. Assist in maintaing strength and stamina in maintenance pulmonary rehab.         Nutrition & Weight - Outcomes:     Pre Biometrics - 07/06/15 1032      Pre Biometrics   Grip Strength 20 kg       Nutrition:     Nutrition Therapy & Goals - 07/23/15 1526      Nutrition Therapy   Diet High Calorie, High Protein     Personal Nutrition Goals   Personal Goal #1 Wt gain of 0.5-2 lb/week to a goal wt of 115 lb at graduation from [place], educate and counsel regarding individualized specific dietary modifications aiming towards targeted core components such as weight, hypertension, lipid management,  diabetes, heart failure and other comorbidities.;Nutrition handout(s) given to patient.  Handouts given for: High Calorie, High Protein diet ; Suggestions for Increasing Calories and Protein; and High Calorie, High Protein recipes   Expected Outcomes Short Term Goal: Understand basic principles of dietary content, such as calories, fat, sodium, cholesterol and nutrients.;Long Term Goal: Adherence to prescribed nutrition plan.      Nutrition Discharge:     Nutrition Assessments - 11/23/15 1031      Rate Your Plate Scores   Pre Score 41   Post Score --  pt did not return survey      Education Questionnaire Score:   Goals reviewed with patient; copy given to patient.

## 2015-12-01 ENCOUNTER — Encounter (HOSPITAL_COMMUNITY): Payer: Medicare Other

## 2015-12-03 ENCOUNTER — Encounter (HOSPITAL_COMMUNITY): Payer: Medicare Other

## 2015-12-08 ENCOUNTER — Encounter (HOSPITAL_COMMUNITY): Payer: Medicare Other

## 2015-12-09 DIAGNOSIS — Z23 Encounter for immunization: Secondary | ICD-10-CM | POA: Diagnosis not present

## 2015-12-09 DIAGNOSIS — Z1211 Encounter for screening for malignant neoplasm of colon: Secondary | ICD-10-CM | POA: Diagnosis not present

## 2015-12-09 DIAGNOSIS — F411 Generalized anxiety disorder: Secondary | ICD-10-CM | POA: Diagnosis not present

## 2015-12-09 DIAGNOSIS — R5381 Other malaise: Secondary | ICD-10-CM | POA: Diagnosis not present

## 2015-12-09 DIAGNOSIS — I10 Essential (primary) hypertension: Secondary | ICD-10-CM | POA: Diagnosis not present

## 2015-12-09 DIAGNOSIS — Z1239 Encounter for other screening for malignant neoplasm of breast: Secondary | ICD-10-CM | POA: Diagnosis not present

## 2015-12-09 DIAGNOSIS — Z9181 History of falling: Secondary | ICD-10-CM | POA: Diagnosis not present

## 2015-12-09 DIAGNOSIS — Z13820 Encounter for screening for osteoporosis: Secondary | ICD-10-CM | POA: Diagnosis not present

## 2015-12-09 DIAGNOSIS — E785 Hyperlipidemia, unspecified: Secondary | ICD-10-CM | POA: Diagnosis not present

## 2015-12-10 ENCOUNTER — Encounter (HOSPITAL_COMMUNITY): Payer: Medicare Other

## 2015-12-10 DIAGNOSIS — K589 Irritable bowel syndrome without diarrhea: Secondary | ICD-10-CM | POA: Diagnosis not present

## 2015-12-10 DIAGNOSIS — R2681 Unsteadiness on feet: Secondary | ICD-10-CM | POA: Diagnosis not present

## 2015-12-10 DIAGNOSIS — J449 Chronic obstructive pulmonary disease, unspecified: Secondary | ICD-10-CM | POA: Diagnosis not present

## 2015-12-10 DIAGNOSIS — F329 Major depressive disorder, single episode, unspecified: Secondary | ICD-10-CM | POA: Diagnosis not present

## 2015-12-10 DIAGNOSIS — I10 Essential (primary) hypertension: Secondary | ICD-10-CM | POA: Diagnosis not present

## 2015-12-10 DIAGNOSIS — K219 Gastro-esophageal reflux disease without esophagitis: Secondary | ICD-10-CM | POA: Diagnosis not present

## 2015-12-10 DIAGNOSIS — G43909 Migraine, unspecified, not intractable, without status migrainosus: Secondary | ICD-10-CM | POA: Diagnosis not present

## 2015-12-10 DIAGNOSIS — Z87891 Personal history of nicotine dependence: Secondary | ICD-10-CM | POA: Diagnosis not present

## 2015-12-10 DIAGNOSIS — E785 Hyperlipidemia, unspecified: Secondary | ICD-10-CM | POA: Diagnosis not present

## 2015-12-10 DIAGNOSIS — Z7982 Long term (current) use of aspirin: Secondary | ICD-10-CM | POA: Diagnosis not present

## 2015-12-10 DIAGNOSIS — H3530 Unspecified macular degeneration: Secondary | ICD-10-CM | POA: Diagnosis not present

## 2015-12-10 DIAGNOSIS — Z9181 History of falling: Secondary | ICD-10-CM | POA: Diagnosis not present

## 2015-12-10 DIAGNOSIS — Z8701 Personal history of pneumonia (recurrent): Secondary | ICD-10-CM | POA: Diagnosis not present

## 2015-12-10 DIAGNOSIS — F419 Anxiety disorder, unspecified: Secondary | ICD-10-CM | POA: Diagnosis not present

## 2015-12-11 ENCOUNTER — Encounter (INDEPENDENT_AMBULATORY_CARE_PROVIDER_SITE_OTHER): Payer: Medicare Other | Admitting: Ophthalmology

## 2015-12-11 DIAGNOSIS — I10 Essential (primary) hypertension: Secondary | ICD-10-CM | POA: Diagnosis not present

## 2015-12-11 DIAGNOSIS — H353211 Exudative age-related macular degeneration, right eye, with active choroidal neovascularization: Secondary | ICD-10-CM

## 2015-12-11 DIAGNOSIS — H35033 Hypertensive retinopathy, bilateral: Secondary | ICD-10-CM | POA: Diagnosis not present

## 2015-12-11 DIAGNOSIS — H43813 Vitreous degeneration, bilateral: Secondary | ICD-10-CM | POA: Diagnosis not present

## 2015-12-11 DIAGNOSIS — H353122 Nonexudative age-related macular degeneration, left eye, intermediate dry stage: Secondary | ICD-10-CM | POA: Diagnosis not present

## 2015-12-14 DIAGNOSIS — K219 Gastro-esophageal reflux disease without esophagitis: Secondary | ICD-10-CM | POA: Diagnosis not present

## 2015-12-14 DIAGNOSIS — J449 Chronic obstructive pulmonary disease, unspecified: Secondary | ICD-10-CM | POA: Diagnosis not present

## 2015-12-14 DIAGNOSIS — F419 Anxiety disorder, unspecified: Secondary | ICD-10-CM | POA: Diagnosis not present

## 2015-12-14 DIAGNOSIS — R2681 Unsteadiness on feet: Secondary | ICD-10-CM | POA: Diagnosis not present

## 2015-12-14 DIAGNOSIS — G43909 Migraine, unspecified, not intractable, without status migrainosus: Secondary | ICD-10-CM | POA: Diagnosis not present

## 2015-12-14 DIAGNOSIS — I10 Essential (primary) hypertension: Secondary | ICD-10-CM | POA: Diagnosis not present

## 2015-12-15 ENCOUNTER — Encounter (HOSPITAL_COMMUNITY): Payer: Medicare Other

## 2015-12-15 DIAGNOSIS — K219 Gastro-esophageal reflux disease without esophagitis: Secondary | ICD-10-CM | POA: Diagnosis not present

## 2015-12-15 DIAGNOSIS — F419 Anxiety disorder, unspecified: Secondary | ICD-10-CM | POA: Diagnosis not present

## 2015-12-15 DIAGNOSIS — G43909 Migraine, unspecified, not intractable, without status migrainosus: Secondary | ICD-10-CM | POA: Diagnosis not present

## 2015-12-15 DIAGNOSIS — J449 Chronic obstructive pulmonary disease, unspecified: Secondary | ICD-10-CM | POA: Diagnosis not present

## 2015-12-15 DIAGNOSIS — R2681 Unsteadiness on feet: Secondary | ICD-10-CM | POA: Diagnosis not present

## 2015-12-15 DIAGNOSIS — I10 Essential (primary) hypertension: Secondary | ICD-10-CM | POA: Diagnosis not present

## 2015-12-16 DIAGNOSIS — K219 Gastro-esophageal reflux disease without esophagitis: Secondary | ICD-10-CM | POA: Diagnosis not present

## 2015-12-16 DIAGNOSIS — J449 Chronic obstructive pulmonary disease, unspecified: Secondary | ICD-10-CM | POA: Diagnosis not present

## 2015-12-16 DIAGNOSIS — R2681 Unsteadiness on feet: Secondary | ICD-10-CM | POA: Diagnosis not present

## 2015-12-16 DIAGNOSIS — F419 Anxiety disorder, unspecified: Secondary | ICD-10-CM | POA: Diagnosis not present

## 2015-12-16 DIAGNOSIS — I10 Essential (primary) hypertension: Secondary | ICD-10-CM | POA: Diagnosis not present

## 2015-12-16 DIAGNOSIS — E785 Hyperlipidemia, unspecified: Secondary | ICD-10-CM | POA: Diagnosis not present

## 2015-12-16 DIAGNOSIS — G43909 Migraine, unspecified, not intractable, without status migrainosus: Secondary | ICD-10-CM | POA: Diagnosis not present

## 2015-12-17 ENCOUNTER — Encounter (HOSPITAL_COMMUNITY): Payer: Medicare Other

## 2015-12-17 DIAGNOSIS — Z780 Asymptomatic menopausal state: Secondary | ICD-10-CM | POA: Diagnosis not present

## 2015-12-17 DIAGNOSIS — M810 Age-related osteoporosis without current pathological fracture: Secondary | ICD-10-CM | POA: Diagnosis not present

## 2015-12-17 DIAGNOSIS — Z13820 Encounter for screening for osteoporosis: Secondary | ICD-10-CM | POA: Diagnosis not present

## 2015-12-18 ENCOUNTER — Ambulatory Visit (INDEPENDENT_AMBULATORY_CARE_PROVIDER_SITE_OTHER): Payer: Medicare Other | Admitting: Family Medicine

## 2015-12-18 ENCOUNTER — Encounter: Payer: Self-pay | Admitting: Family Medicine

## 2015-12-18 VITALS — BP 120/72 | HR 66 | Resp 12 | Ht 62.0 in | Wt 120.4 lb

## 2015-12-18 DIAGNOSIS — I10 Essential (primary) hypertension: Secondary | ICD-10-CM

## 2015-12-18 DIAGNOSIS — J449 Chronic obstructive pulmonary disease, unspecified: Secondary | ICD-10-CM | POA: Diagnosis not present

## 2015-12-18 DIAGNOSIS — R51 Headache: Secondary | ICD-10-CM

## 2015-12-18 DIAGNOSIS — R2681 Unsteadiness on feet: Secondary | ICD-10-CM

## 2015-12-18 DIAGNOSIS — R519 Headache, unspecified: Secondary | ICD-10-CM

## 2015-12-18 DIAGNOSIS — J45909 Unspecified asthma, uncomplicated: Secondary | ICD-10-CM

## 2015-12-18 NOTE — Progress Notes (Signed)
HPI:   Gina Fritz is a 75 y.o. female, who is here today to establish care with me.  Former PCP: N/A, she still follows with Dr Tollie Pizza in [place], [place] but would like a PCP also here in [place], [place] Last preventive routine visit: Not sure.  Concerns today: None   She lives with husband and every 3 month they both to her daughter's house in [place] and stayed there for 3 months or so. Independent ADL's except for walking, now using a cane. She is not driving currently, still recovering from recent illness. In general independent IADL's. No falls in the past year.  Reporting DEXA done yesterday, results pending. No prior Hx of osteoporosis.   She was recently hospitalized, from 11/14/2015 to 11/20/2015.  She had been in the ER a few times within a week prior to admission c/o nausea and vomiting, treated with Phenergan and H2 blocker.  She presented to the ER on 11/14/2015 initially because severe headache associated with nausea and vomiting, new onset.  Discharged Dx: COPD/asthma exacerbation and according to pt, Dx with pneumonia.  She was started on Gabapentin 300 mg tid after neurologists consultation. She was discharged to a SNF, [facility], where she stayed from 11/20/2015 through 12/06/2015.  Currently she is doing PT at home twice per week, she is also having home health to help with some ADLs. According to patient, she already had labs done a few days after hospital discharge while she was in the skilled facility, also she just had labs done with Dr. Tollie Pizza, results pending.   She is not longer having headache.   Still not back to baseline, about 80% per husband but she thinks 50%. She has poor balance, she is using a cane.  She feels like her health was declining for the past 2-3 years, she states that she was not taking care of herself due to busy schedule.  Former smoker, she quit smoking at the time of hospital discharged.  -She  follows with cardiologist, neurologist, pulmonologist, and ophthalmologist. She has history of right eye macular degeneration, she received intra-articular injections monthly.  She saw cardiologist initially for dizziness, then according to pt, she was referred to neurologist and diagnosed with "silent migraines", dizziness has resolved.    Depression and anxiety:  She has been on Lexapro 14 years ago. No other meds in the past. She feels like medication is helping.  She denies depressed mood or suicidal thoughts.  Sleeping well.    Hypertension:   Currently on Amlodipine 10 mg and Losartan 100 mg daily.   She is taking medications as instructed, no side effects reported.  She has not noted visual changes, exertional chest pain, dyspnea,  focal weakness, or edema.   Lab Results  Component Value Date   CREATININE 0.79 11/19/2015   BUN 14 11/19/2015   NA 138 11/19/2015   K 4.2 11/19/2015   CL 102 11/19/2015   CO2 28 11/19/2015    She has history of COPD and asthma, currently she is following with pulmonologist. She completed pulmonary rehabilitation. She denies any cough or wheezing. Currently she is on Dulera, Singulair,and Albuterol. She follows with Dr Halford Chessman, pulmonologist, last seen 11/23/15.  -History of urinary urgency and overactive bladder, currently she is on Vesicare and Flomax. She denies any dysuria, gross hematuria, or decreased urine output.  -History of hyperlipidemia, currently she is on Lipitor 40 mg daily.  She is eating healthier, low fat diet.  Review of Systems  Constitutional: Negative for appetite change, fatigue, fever and unexpected weight change.  HENT: Negative for mouth sores, nosebleeds and trouble swallowing.   Eyes: Negative for pain, redness and visual disturbance.  Respiratory: Negative for cough, shortness of breath and wheezing.   Cardiovascular: Negative for chest pain, palpitations and leg swelling.  Gastrointestinal:  Negative for abdominal pain, nausea and vomiting.       Negative for changes in bowel habits.  Genitourinary: Negative for decreased urine volume, difficulty urinating, dysuria and hematuria.  Musculoskeletal: Positive for arthralgias and gait problem. Negative for myalgias.  Skin: Negative for rash and wound.  Neurological: Negative for seizures, syncope, weakness, numbness and headaches.  Psychiatric/Behavioral: Negative for confusion and sleep disturbance. The patient is not nervous/anxious.       Current Outpatient Prescriptions on File Prior to Visit  Medication Sig Dispense Refill  . acetaminophen (TYLENOL) 325 MG tablet Take 2 tablets (650 mg total) by mouth every 6 (six) hours as needed for mild pain (or Fever >/= 101). 30 tablet 1  . albuterol (PROAIR HFA) 108 (90 Base) MCG/ACT inhaler Inhale 2 puffs into the lungs every 6 (six) hours as needed for wheezing or shortness of breath. 1 Inhaler 2  . amLODipine (NORVASC) 10 MG tablet Take 1 tablet (10 mg total) by mouth daily. 30 tablet 1  . aspirin EC 81 MG tablet Take 81 mg by mouth daily.    Marland Kitchen atorvastatin (LIPITOR) 40 MG tablet Take 40 mg by mouth daily.    Marland Kitchen BESIVANCE 0.6 % SUSP Place 1 drop into the right eye 4 (four) times daily. The day before and the day of the dr visit    . Cholecalciferol (VITAMIN D) 2000 UNITS CAPS Take 2,000 Units by mouth daily.    . DONNATAL 16.2 MG tablet Take 16.2 tablets by mouth daily as needed (ibs).     Marland Kitchen escitalopram (LEXAPRO) 20 MG tablet Take 20 mg by mouth daily.     . famotidine (PEPCID) 40 MG tablet Take 40 mg by mouth daily.    . fluticasone (FLONASE) 50 MCG/ACT nasal spray Place 2 sprays into the nose daily.    Marland Kitchen gabapentin (NEURONTIN) 300 MG capsule Take 1 capsule (300 mg total) by mouth 3 (three) times daily. 90 capsule 1  . losartan (COZAAR) 100 MG tablet Take 1 tablet (100 mg total) by mouth daily. 30 tablet 1  . mometasone-formoterol (DULERA) 100-5 MCG/ACT AERO Inhale 2 puffs into the  lungs 2 (two) times daily. 1 Inhaler 5  . montelukast (SINGULAIR) 10 MG tablet Take 10 mg by mouth daily.     . Multiple Vitamin (MULTIVITAMIN WITH MINERALS) TABS Take 1 tablet by mouth daily.    . Multiple Vitamins-Minerals (ICAPS AREDS 2 PO) Take 1 tablet by mouth 2 (two) times daily.    . pantoprazole (PROTONIX) 40 MG tablet Take 1 tablet (40 mg total) by mouth daily. 30 tablet 1  . polyethylene glycol powder (GLYCOLAX/MIRALAX) powder Take 1 Container by mouth once.    . promethazine (PHENERGAN) 12.5 MG suppository Place 12.5 mg rectally every 6 (six) hours as needed for nausea or vomiting.    . ranitidine (ZANTAC) 300 MG tablet Take 300 mg by mouth 2 (two) times daily.    . solifenacin (VESICARE) 10 MG tablet Take 10 mg by mouth daily.     . sucralfate (CARAFATE) 1 GM/10ML suspension Take 1 g by mouth 4 (four) times daily.    . tamsulosin (FLOMAX) 0.4 MG  CAPS capsule Take 1 capsule (0.4 mg total) by mouth daily after breakfast. 30 capsule 2   No current facility-administered medications on file prior to visit.      Past Medical History:  Diagnosis Date  . Allergic rhinitis   . Anxiety   . CAP (community acquired pneumonia)    dx 02-05-2015  . Colitis    dx 02-05-2015  . COPD with emphysema ([place])   . Depression   . Dizziness   . GERD (gastroesophageal reflux disease)   . History of chronic sinusitis   . History of kidney stones   . Hyperlipidemia   . Hypertension   . Irritable bowel syndrome   . Loss of weight   . Pulmonary nodules    benign and stable per CT  . Rash   . Right ureteral stone   . Sensorineural hearing loss, unilateral   . Subjective tinnitus    Allergies  Allergen Reactions  . Penicillins Anaphylaxis    Has patient had a PCN reaction causing immediate rash, facial/tongue/throat swelling, SOB or lightheadedness with hypotension: Yes Has patient had a PCN reaction causing severe rash involving mucus membranes or skin necrosis: Yes Has patient had a PCN  reaction that required hospitalization Yes Has patient had a PCN reaction occurring within the last 10 years: No If all of the above answers are "NO", then may proceed with Cephalosporin use.   . Sulfa Antibiotics Hives  . Soy Allergy Itching and Rash    Family History  Problem Relation Age of Onset  . Congestive Heart Failure Mother   . Hearing loss Mother   . Stroke Mother     syndrome  . Hypertension Father   . Other      thyroid disorder    Social History   Social History  . Marital status: Married    Spouse name: [name]  . Number of children: 3  . Years of education: college   Occupational History  .      Retired   Social History Main Topics  . Smoking status: Former Smoker    Packs/day: 1.00    Years: 25.00    Types: Cigarettes    Quit date: 02/05/2015  . Smokeless tobacco: Never Used  . Alcohol use 0.6 oz/week    1 Glasses of wine per week     Comment: One drink a week.  . Drug use: No  . Sexual activity: Not Asked   Other Topics Concern  . None   Social History Narrative   Patient is married [name]) and lives at home with her husband.   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   Caffeine- Six cups of coffee daily and six cups of coke cola daily.          Vitals:   12/18/15 0759  BP: 120/72  Pulse: 66  Resp: 12     Body mass index is 22.02 kg/m.    Physical Exam  Nursing note and vitals reviewed. Constitutional: She is oriented to person, place, and time. She appears well-developed. No distress.  HENT:  Head: Atraumatic.  Mouth/Throat: Oropharynx is clear and moist and mucous membranes are normal.  Eyes: Conjunctivae and EOM are normal. Pupils are equal, round, and reactive to light.  Neck: No thyroid mass and no thyromegaly present.  Cardiovascular: Normal rate and regular rhythm.   No murmur heard. Pulses:      Dorsalis pedis pulses are 2+ on the  right side, and 2+ on the left side.    Respiratory: Effort normal and breath sounds normal. No respiratory distress.  GI: Soft. She exhibits no mass. There is no hepatomegaly. There is no tenderness.  Musculoskeletal: She exhibits no edema.  Lymphadenopathy:    She has no cervical adenopathy.  Neurological: She is alert and oriented to person, place, and time. She has normal strength. Coordination normal.  Stable gait assisted with cane  Skin: Skin is warm. No erythema.  Psychiatric: She has a normal mood and affect. Cognition and memory are normal.  Well groomed, good eye contact.      ASSESSMENT AND PLAN:     Lyah was seen today for new patient (initial visit).  Diagnoses and all orders for this visit:  Unstable gait  Fall prevention discussed. Continue PT.  Explained that it may take a few weeks and sometimes month to go back to baseline, so she needs to be patient and cautious.  Essential hypertension, benign  Adequately controlled. No changes in current management. Low salt diet. Labs done recently by Dr Tollie Pizza, so no labs needed today. I will see her back in 3-4 months.  COPD with asthma ([place])  Congratulated for quitting smoking. Well controlled. Continue following with Dr Halford Chessman.   Headache, unspecified headache type  Resolved. No changes in Gabapentin. She has a f/u appt, which she is planning on keeping. Instructed about warning signs.         Lavanna Rog G. [place], MD  [facility]. [place] office.

## 2015-12-18 NOTE — Patient Instructions (Addendum)
A few things to remember from today's visit:   Abnormality of gait  Essential hypertension, benign  A few tips:  -As we age balance is not as good as it was, so there is a higher risks for falls. Please remove small rugs and furniture that is "in your way" and could increase the risk of falls. Stretching exercises may help with fall prevention: Yoga and Tai Chi are some examples. Low impact exercise is better, so you are not very achy the next day.   - Some medications are not safe as we age, increases the risk of side effects and can potentially interact with other medication you are also taken;  including some of over the counter medications. Be sure to let me know when you start a new medication even if it is a dietary/vitamin supplement.   -Healthy diet low in red meet/animal fat and sugar + regular physical activity is recommended.       Please be sure medication list is accurate. If a new problem present, please set up appointment sooner than planned today.

## 2015-12-22 ENCOUNTER — Encounter (HOSPITAL_COMMUNITY): Payer: Medicare Other

## 2015-12-23 DIAGNOSIS — G43909 Migraine, unspecified, not intractable, without status migrainosus: Secondary | ICD-10-CM | POA: Diagnosis not present

## 2015-12-23 DIAGNOSIS — F419 Anxiety disorder, unspecified: Secondary | ICD-10-CM | POA: Diagnosis not present

## 2015-12-23 DIAGNOSIS — K219 Gastro-esophageal reflux disease without esophagitis: Secondary | ICD-10-CM | POA: Diagnosis not present

## 2015-12-23 DIAGNOSIS — J449 Chronic obstructive pulmonary disease, unspecified: Secondary | ICD-10-CM | POA: Diagnosis not present

## 2015-12-23 DIAGNOSIS — R2681 Unsteadiness on feet: Secondary | ICD-10-CM | POA: Diagnosis not present

## 2015-12-23 DIAGNOSIS — I10 Essential (primary) hypertension: Secondary | ICD-10-CM | POA: Diagnosis not present

## 2015-12-24 ENCOUNTER — Encounter (HOSPITAL_COMMUNITY): Payer: Medicare Other

## 2015-12-24 DIAGNOSIS — G43909 Migraine, unspecified, not intractable, without status migrainosus: Secondary | ICD-10-CM | POA: Diagnosis not present

## 2015-12-24 DIAGNOSIS — J449 Chronic obstructive pulmonary disease, unspecified: Secondary | ICD-10-CM | POA: Diagnosis not present

## 2015-12-24 DIAGNOSIS — I10 Essential (primary) hypertension: Secondary | ICD-10-CM | POA: Diagnosis not present

## 2015-12-24 DIAGNOSIS — R2681 Unsteadiness on feet: Secondary | ICD-10-CM | POA: Diagnosis not present

## 2015-12-24 DIAGNOSIS — F419 Anxiety disorder, unspecified: Secondary | ICD-10-CM | POA: Diagnosis not present

## 2015-12-24 DIAGNOSIS — K219 Gastro-esophageal reflux disease without esophagitis: Secondary | ICD-10-CM | POA: Diagnosis not present

## 2015-12-25 DIAGNOSIS — R2681 Unsteadiness on feet: Secondary | ICD-10-CM | POA: Diagnosis not present

## 2015-12-25 DIAGNOSIS — K219 Gastro-esophageal reflux disease without esophagitis: Secondary | ICD-10-CM | POA: Diagnosis not present

## 2015-12-25 DIAGNOSIS — J449 Chronic obstructive pulmonary disease, unspecified: Secondary | ICD-10-CM | POA: Diagnosis not present

## 2015-12-25 DIAGNOSIS — G43909 Migraine, unspecified, not intractable, without status migrainosus: Secondary | ICD-10-CM | POA: Diagnosis not present

## 2015-12-25 DIAGNOSIS — I10 Essential (primary) hypertension: Secondary | ICD-10-CM | POA: Diagnosis not present

## 2015-12-25 DIAGNOSIS — F419 Anxiety disorder, unspecified: Secondary | ICD-10-CM | POA: Diagnosis not present

## 2015-12-28 DIAGNOSIS — K219 Gastro-esophageal reflux disease without esophagitis: Secondary | ICD-10-CM | POA: Diagnosis not present

## 2015-12-28 DIAGNOSIS — J449 Chronic obstructive pulmonary disease, unspecified: Secondary | ICD-10-CM | POA: Diagnosis not present

## 2015-12-28 DIAGNOSIS — I10 Essential (primary) hypertension: Secondary | ICD-10-CM | POA: Diagnosis not present

## 2015-12-28 DIAGNOSIS — R2681 Unsteadiness on feet: Secondary | ICD-10-CM | POA: Diagnosis not present

## 2015-12-28 DIAGNOSIS — F419 Anxiety disorder, unspecified: Secondary | ICD-10-CM | POA: Diagnosis not present

## 2015-12-28 DIAGNOSIS — G43909 Migraine, unspecified, not intractable, without status migrainosus: Secondary | ICD-10-CM | POA: Diagnosis not present

## 2015-12-29 ENCOUNTER — Encounter (HOSPITAL_COMMUNITY): Payer: Medicare Other

## 2015-12-30 DIAGNOSIS — K219 Gastro-esophageal reflux disease without esophagitis: Secondary | ICD-10-CM | POA: Diagnosis not present

## 2015-12-30 DIAGNOSIS — F419 Anxiety disorder, unspecified: Secondary | ICD-10-CM | POA: Diagnosis not present

## 2015-12-30 DIAGNOSIS — G43909 Migraine, unspecified, not intractable, without status migrainosus: Secondary | ICD-10-CM | POA: Diagnosis not present

## 2015-12-30 DIAGNOSIS — R2681 Unsteadiness on feet: Secondary | ICD-10-CM | POA: Diagnosis not present

## 2015-12-30 DIAGNOSIS — J449 Chronic obstructive pulmonary disease, unspecified: Secondary | ICD-10-CM | POA: Diagnosis not present

## 2015-12-30 DIAGNOSIS — I10 Essential (primary) hypertension: Secondary | ICD-10-CM | POA: Diagnosis not present

## 2015-12-31 ENCOUNTER — Encounter (HOSPITAL_COMMUNITY): Payer: Medicare Other

## 2016-01-04 DIAGNOSIS — M810 Age-related osteoporosis without current pathological fracture: Secondary | ICD-10-CM | POA: Diagnosis not present

## 2016-01-05 ENCOUNTER — Encounter (HOSPITAL_COMMUNITY): Payer: Medicare Other

## 2016-01-07 ENCOUNTER — Encounter (HOSPITAL_COMMUNITY): Payer: Medicare Other

## 2016-01-08 ENCOUNTER — Encounter (INDEPENDENT_AMBULATORY_CARE_PROVIDER_SITE_OTHER): Payer: Medicare Other | Admitting: Ophthalmology

## 2016-01-08 DIAGNOSIS — H43813 Vitreous degeneration, bilateral: Secondary | ICD-10-CM | POA: Diagnosis not present

## 2016-01-08 DIAGNOSIS — H353211 Exudative age-related macular degeneration, right eye, with active choroidal neovascularization: Secondary | ICD-10-CM | POA: Diagnosis not present

## 2016-01-08 DIAGNOSIS — H353122 Nonexudative age-related macular degeneration, left eye, intermediate dry stage: Secondary | ICD-10-CM | POA: Diagnosis not present

## 2016-01-08 DIAGNOSIS — H35033 Hypertensive retinopathy, bilateral: Secondary | ICD-10-CM | POA: Diagnosis not present

## 2016-01-08 DIAGNOSIS — I10 Essential (primary) hypertension: Secondary | ICD-10-CM

## 2016-01-12 ENCOUNTER — Encounter (HOSPITAL_COMMUNITY): Payer: Medicare Other

## 2016-01-14 ENCOUNTER — Encounter (HOSPITAL_COMMUNITY): Payer: Medicare Other

## 2016-01-19 ENCOUNTER — Encounter (HOSPITAL_COMMUNITY): Payer: Medicare Other

## 2016-01-21 ENCOUNTER — Encounter (HOSPITAL_COMMUNITY): Payer: Medicare Other

## 2016-01-25 ENCOUNTER — Ambulatory Visit: Payer: Medicare Other | Admitting: Neurology

## 2016-01-26 ENCOUNTER — Encounter (HOSPITAL_COMMUNITY): Payer: Medicare Other

## 2016-01-28 ENCOUNTER — Encounter (HOSPITAL_COMMUNITY): Payer: Medicare Other

## 2016-02-02 ENCOUNTER — Encounter (HOSPITAL_COMMUNITY): Payer: Medicare Other

## 2016-02-04 ENCOUNTER — Encounter (HOSPITAL_COMMUNITY): Payer: Medicare Other

## 2016-02-05 ENCOUNTER — Encounter (INDEPENDENT_AMBULATORY_CARE_PROVIDER_SITE_OTHER): Payer: Medicare Other | Admitting: Ophthalmology

## 2016-02-09 ENCOUNTER — Encounter (HOSPITAL_COMMUNITY): Payer: Medicare Other

## 2016-02-10 ENCOUNTER — Ambulatory Visit (INDEPENDENT_AMBULATORY_CARE_PROVIDER_SITE_OTHER): Payer: Medicare Other | Admitting: Internal Medicine

## 2016-02-10 ENCOUNTER — Encounter: Payer: Self-pay | Admitting: Internal Medicine

## 2016-02-10 VITALS — BP 118/66 | HR 74 | Temp 97.6°F | Ht 62.0 in | Wt 123.8 lb

## 2016-02-10 DIAGNOSIS — R197 Diarrhea, unspecified: Secondary | ICD-10-CM | POA: Diagnosis not present

## 2016-02-10 NOTE — Patient Instructions (Addendum)
This acts like   A gastroenteritis usually cause by virus infection self limiting   .  The bowel usually heals on its own but you need to keep hydrated until your bowel recovers from this illness. Solids or not as important as fluids. Water Pedialyte Gatorade ginger ale are fine and then can add soft foods as tolerated. If you're diarrhea is so profuse you can use Imodium over-the-counter as directed. This can slow the diarrhea down but when it wears off and go back to its baseline. Most diarrheal illness are totally gone in 10-14 days but the worst of it is usually over in the first 72 hours. If you get vomiting fever blood in your stool severe pain dehydration contact your health care team her recheck with her primary care doctor or seek emergent care. Diarrhea Diarrhea is frequent loose and watery bowel movements. It can cause you to feel weak and dehydrated. Dehydration can cause you to become tired and thirsty, have a dry mouth, and have decreased urination that often is dark yellow. Diarrhea is a sign of another problem, most often an infection that will not last long. In most cases, diarrhea typically lasts 2-3 days. However, it can last longer if it is a sign of something more serious. It is important to treat your diarrhea as directed by your caregiver to lessen or prevent future episodes of diarrhea. CAUSES  Some common causes include:  Gastrointestinal infections caused by viruses, bacteria, or parasites.  Food poisoning or food allergies.  Certain medicines, such as antibiotics, chemotherapy, and laxatives.  Artificial sweeteners and fructose.  Digestive disorders. HOME CARE INSTRUCTIONS  Ensure adequate fluid intake (hydration): Have 1 cup (8 oz) of fluid for each diarrhea episode. Avoid fluids that contain simple sugars or sports drinks, fruit juices, whole milk products, and sodas. Your urine should be clear or pale yellow if you are drinking enough fluids. Hydrate with an oral  rehydration solution that you can purchase at pharmacies, retail stores, and online. You can prepare an oral rehydration solution at home by mixing the following ingredients together:   - tsp table salt.   tsp baking soda.   tsp salt substitute containing potassium chloride.  1  tablespoons sugar.  1 L (34 oz) of water.  Certain foods and beverages may increase the speed at which food moves through the gastrointestinal (GI) tract. These foods and beverages should be avoided and include:  Caffeinated and alcoholic beverages.  High-fiber foods, such as raw fruits and vegetables, nuts, seeds, and whole grain breads and cereals.  Foods and beverages sweetened with sugar alcohols, such as xylitol, sorbitol, and mannitol.  Some foods may be well tolerated and may help thicken stool including:  Starchy foods, such as rice, toast, pasta, low-sugar cereal, oatmeal, grits, baked potatoes, crackers, and bagels.  Bananas.  Applesauce.  Add probiotic-rich foods to help increase healthy bacteria in the GI tract, such as yogurt and fermented milk products.  Wash your hands well after each diarrhea episode.  Only take over-the-counter or prescription medicines as directed by your caregiver.  Take a warm bath to relieve any burning or pain from frequent diarrhea episodes. SEEK IMMEDIATE MEDICAL CARE IF:   You are unable to keep fluids down.  You have persistent vomiting.  You have blood in your stool, or your stools are black and tarry.  You do not urinate in 6-8 hours, or there is only a small amount of very dark urine.  You have abdominal pain that  increases or localizes.  You have weakness, dizziness, confusion, or light-headedness.  You have a severe headache.  Your diarrhea gets worse or does not get better.  You have a fever or persistent symptoms for more than 2-3 days.  You have a fever and your symptoms suddenly get worse. MAKE SURE YOU:   Understand these  instructions.  Will watch your condition.  Will get help right away if you are not doing well or get worse.   This information is not intended to replace advice given to you by your health care provider. Make sure you discuss any questions you have with your health care provider.   Document Released: 03/11/2002 Document Revised: 04/11/2014 Document Reviewed: 11/27/2011 Elsevier Interactive Patient Education [employer].

## 2016-02-10 NOTE — Progress Notes (Signed)
Pre visit review using our clinic review tool, if applicable. No additional management support is needed unless otherwise documented below in the visit note.  Chief Complaint  Patient presents with  . Diarrhea  . Nausea    HPI: Gina Fritz 75 y.o.  PCP na  Dr [place]   Here with husband   Onset .  In past 24 hours of nausea and  Diarrhea    Awoke this am and had  Diarrhea about 12 time in the last night  Loose no blood no unusal cramps pain fever    Some nausea no vomiting .  No new  meds  No others sick  Small children     City water.  No fever semiformed .    No vomiting  .   No  Travel recenet antibiotics  ROS: See pertinent positives and negatives per HPI. Has hx of family worry  about eating but doing better  Until this episode  No hx recurrent diarrhea has had gastritis before   No ulcer bleeding ?  See below   Past Medical History:  Diagnosis Date  . Allergic rhinitis   . Anxiety   . CAP (community acquired pneumonia)    dx 02-05-2015  . Colitis    dx 02-05-2015  . COPD with emphysema ([place])   . Depression   . Dizziness   . GERD (gastroesophageal reflux disease)   . History of chronic sinusitis   . History of kidney stones   . Hyperlipidemia   . Hypertension   . Irritable bowel syndrome   . Loss of weight   . Pulmonary nodules    benign and stable per CT  . Rash   . Right ureteral stone   . Sensorineural hearing loss, unilateral   . Subjective tinnitus     Family History  Problem Relation Age of Onset  . Congestive Heart Failure Mother   . Hearing loss Mother   . Stroke Mother     syndrome  . Hypertension Father   . Other      thyroid disorder    Social History   Social History  . Marital status: Married    Spouse name: [name]  . Number of children: 3  . Years of education: college   Occupational History  .      Retired   Social History Main Topics  . Smoking status: Former Smoker    Packs/day: 1.00    Years: 25.00    Types:  Cigarettes    Quit date: 02/05/2015  . Smokeless tobacco: Never Used  . Alcohol use 0.6 oz/week    1 Glasses of wine per week     Comment: One drink a week.  . Drug use: No  . Sexual activity: Not Asked   Other Topics Concern  . None   Social History Narrative   Patient is married [name]) and lives at home with her husband.   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   Caffeine- Six cups of coffee daily and six cups of coke cola daily.          Outpatient Medications Prior to Visit  Medication Sig Dispense Refill  . acetaminophen (TYLENOL) 325 MG tablet Take 2 tablets (650 mg total) by mouth every 6 (six) hours as needed for mild pain (or Fever >/= 101). 30 tablet 1  . albuterol (PROAIR HFA) 108 (90 Base) MCG/ACT inhaler Inhale 2 puffs into the lungs  every 6 (six) hours as needed for wheezing or shortness of breath. 1 Inhaler 2  . amLODipine (NORVASC) 10 MG tablet Take 1 tablet (10 mg total) by mouth daily. 30 tablet 1  . aspirin EC 81 MG tablet Take 81 mg by mouth daily.    Marland Kitchen atorvastatin (LIPITOR) 40 MG tablet Take 40 mg by mouth daily.    . Cholecalciferol (VITAMIN D) 2000 UNITS CAPS Take 2,000 Units by mouth daily.    . DONNATAL 16.2 MG tablet Take 16.2 tablets by mouth daily as needed (ibs).     Marland Kitchen escitalopram (LEXAPRO) 20 MG tablet Take 20 mg by mouth daily.     . famotidine (PEPCID) 40 MG tablet Take 40 mg by mouth daily.    . fluticasone (FLONASE) 50 MCG/ACT nasal spray Place 2 sprays into the nose daily.    Marland Kitchen losartan (COZAAR) 100 MG tablet Take 1 tablet (100 mg total) by mouth daily. 30 tablet 1  . mometasone-formoterol (DULERA) 100-5 MCG/ACT AERO Inhale 2 puffs into the lungs 2 (two) times daily. 1 Inhaler 5  . montelukast (SINGULAIR) 10 MG tablet Take 10 mg by mouth daily.     . Multiple Vitamin (MULTIVITAMIN WITH MINERALS) TABS Take 1 tablet by mouth daily.    . Multiple Vitamins-Minerals (ICAPS AREDS 2 PO) Take 1 tablet by  mouth 2 (two) times daily.    . pantoprazole (PROTONIX) 40 MG tablet Take 1 tablet (40 mg total) by mouth daily. 30 tablet 1  . polyethylene glycol powder (GLYCOLAX/MIRALAX) powder Take 1 Container by mouth once.    . ranitidine (ZANTAC) 300 MG tablet Take 300 mg by mouth 2 (two) times daily.    . solifenacin (VESICARE) 10 MG tablet Take 10 mg by mouth daily.     . sucralfate (CARAFATE) 1 GM/10ML suspension Take 1 g by mouth 4 (four) times daily.    . tamsulosin (FLOMAX) 0.4 MG CAPS capsule Take 1 capsule (0.4 mg total) by mouth daily after breakfast. 30 capsule 2  . BESIVANCE 0.6 % SUSP Place 1 drop into the right eye 4 (four) times daily. The day before and the day of the dr visit    . gabapentin (NEURONTIN) 300 MG capsule Take 1 capsule (300 mg total) by mouth 3 (three) times daily. (Patient not taking: Reported on 02/10/2016) 90 capsule 1  . promethazine (PHENERGAN) 12.5 MG suppository Place 12.5 mg rectally every 6 (six) hours as needed for nausea or vomiting.     No facility-administered medications prior to visit.      EXAM:  BP 118/66 (BP Location: Right Arm, Patient Position: Sitting, Cuff Size: Normal)   Pulse 74   Temp 97.6 F (36.4 C) (Oral)   Ht 5\' 2"  (1.575 m)   Wt 123 lb 12.8 oz (56.2 kg)   BMI 22.64 kg/m   Body mass index is 22.64 kg/m.  GENERAL: vitals reviewed and listed above, alert, oriented, appears well hydrated and in no acute distress  Extra mouth movments  But nl speech   And responses  HEENT: atraumatic, conjunctiva  clear, no obvious abnormalities on inspection of external nose and ears OP : no lesion edema or exudate  Moist mucous membranes  NECK: no obvious masses on inspection palpation  LUNGS: clear to auscultation bilaterally, no wheezes, rales or rhonchi,  De bs? No acute findinfs  CV: HRRR, hr   Reg  no clubbing cyanosis or  peripheral edema nl cap refill  Abdomen:  Sof,t normal bowel sounds without  hepatosplenomegaly, no guarding rebound or masses  no CVA tenderness MS: moves all extremities without noticeable focal  abnormality  skin non icteric no acute rashes  Wt Readings from Last 3 Encounters:  02/10/16 123 lb 12.8 oz (56.2 kg)  12/18/15 120 lb 6 oz (54.6 kg)  11/23/15 117 lb 9.6 oz (53.3 kg)    ASSESSMENT AND PLAN:  Discussed the following assessment and plan:  Acute diarrhea Appears to be acute insult probably viral has a history of poor intake at times and history of dehydration when she "would need". No significant pain alarm findings otherwise on her exam reviewed her medications discussed by mouth fluid intake until this resolves or to follow-up with her PCP if this ongoing or atypical.   Expectant management. -Patient advised to return or notify health care team  if symptoms worsen ,persist or new concerns arise.  Patient Instructions   This acts like   A gastroenteritis usually cause by virus infection self limiting   .  The bowel usually heals on its own but you need to keep hydrated until your bowel recovers from this illness. Solids or not as important as fluids. Water Pedialyte Gatorade ginger ale are fine and then can add soft foods as tolerated. If you're diarrhea is so profuse you can use Imodium over-the-counter as directed. This can slow the diarrhea down but when it wears off and go back to its baseline. Most diarrheal illness are totally gone in 10-14 days but the worst of it is usually over in the first 72 hours. If you get vomiting fever blood in your stool severe pain dehydration contact your health care team her recheck with her primary care doctor or seek emergent care. Diarrhea Diarrhea is frequent loose and watery bowel movements. It can cause you to feel weak and dehydrated. Dehydration can cause you to become tired and thirsty, have a dry mouth, and have decreased urination that often is dark yellow. Diarrhea is a sign of another problem, most often an infection that will not last long. In most cases,  diarrhea typically lasts 2-3 days. However, it can last longer if it is a sign of something more serious. It is important to treat your diarrhea as directed by your caregiver to lessen or prevent future episodes of diarrhea. CAUSES  Some common causes include:  Gastrointestinal infections caused by viruses, bacteria, or parasites.  Food poisoning or food allergies.  Certain medicines, such as antibiotics, chemotherapy, and laxatives.  Artificial sweeteners and fructose.  Digestive disorders. HOME CARE INSTRUCTIONS  Ensure adequate fluid intake (hydration): Have 1 cup (8 oz) of fluid for each diarrhea episode. Avoid fluids that contain simple sugars or sports drinks, fruit juices, whole milk products, and sodas. Your urine should be clear or pale yellow if you are drinking enough fluids. Hydrate with an oral rehydration solution that you can purchase at pharmacies, retail stores, and online. You can prepare an oral rehydration solution at home by mixing the following ingredients together:   - tsp table salt.   tsp baking soda.   tsp salt substitute containing potassium chloride.  1  tablespoons sugar.  1 L (34 oz) of water.  Certain foods and beverages may increase the speed at which food moves through the gastrointestinal (GI) tract. These foods and beverages should be avoided and include:  Caffeinated and alcoholic beverages.  High-fiber foods, such as raw fruits and vegetables, nuts, seeds, and whole grain breads and cereals.  Foods and beverages sweetened with sugar alcohols,  such as xylitol, sorbitol, and mannitol.  Some foods may be well tolerated and may help thicken stool including:  Starchy foods, such as rice, toast, pasta, low-sugar cereal, oatmeal, grits, baked potatoes, crackers, and bagels.  Bananas.  Applesauce.  Add probiotic-rich foods to help increase healthy bacteria in the GI tract, such as yogurt and fermented milk products.  Wash your hands well  after each diarrhea episode.  Only take over-the-counter or prescription medicines as directed by your caregiver.  Take a warm bath to relieve any burning or pain from frequent diarrhea episodes. SEEK IMMEDIATE MEDICAL CARE IF:   You are unable to keep fluids down.  You have persistent vomiting.  You have blood in your stool, or your stools are black and tarry.  You do not urinate in 6-8 hours, or there is only a small amount of very dark urine.  You have abdominal pain that increases or localizes.  You have weakness, dizziness, confusion, or light-headedness.  You have a severe headache.  Your diarrhea gets worse or does not get better.  You have a fever or persistent symptoms for more than 2-3 days.  You have a fever and your symptoms suddenly get worse. MAKE SURE YOU:   Understand these instructions.  Will watch your condition.  Will get help right away if you are not doing well or get worse.   This information is not intended to replace advice given to you by your health care provider. Make sure you discuss any questions you have with your health care provider.   Document Released: 03/11/2002 Document Revised: 04/11/2014 Document Reviewed: 11/27/2011 Elsevier Interactive Patient Education 2016 [place] K. Panosh M.D.

## 2016-02-11 ENCOUNTER — Telehealth: Payer: Self-pay | Admitting: Family Medicine

## 2016-02-11 ENCOUNTER — Encounter (HOSPITAL_COMMUNITY): Payer: Medicare Other

## 2016-02-11 MED ORDER — ONDANSETRON 4 MG PO TBDP: 4.0000 mg | ORAL_TABLET | Freq: Three times a day (TID) | ORAL | 0 refills | Status: DC | PRN

## 2016-02-11 NOTE — Telephone Encounter (Signed)
Spoke with pt's husband and he states that pt as taken one tablet of odansetron and still has n/v. Pt vomited last about an hour ago, emesis is clear liquid. Advised husb that pt can take an additional odansetron as 8mg /8hr is fine. Advised husb to give this dose immediately, apply cold cloths to back of neck and abdomen to try and help nausea along with small sips of clear liquids. Asked him to call us if in the next few hours pt continues to vomit. He voiced understanding. Nothing further needed at this time.

## 2016-02-11 NOTE — Telephone Encounter (Signed)
Pt husband is calling requesting [place] to give her wife a shot for nausea. Pt was seen yesterday

## 2016-02-11 NOTE — Telephone Encounter (Signed)
Spoke with Dr Regis Bill and she states that the only injection we have for nausea is phenergan, which will make pt too drowsy and likely not take in any liquids. She would prefer to use Zofran, 4mg , sub tablets.   Spoke with pt's husband and advised. He agrees. He will call if pt is not improving with medication. Advised husband to have pt begin to drink clear liquids very slowly and increase as tolerated to help rehydrate. He voiced understanding.   Rx sent.   Dr. [place] - Juluis Rainier

## 2016-02-11 NOTE — Telephone Encounter (Signed)
She saw Dr. Regis Bill yesterday.

## 2016-02-11 NOTE — Telephone Encounter (Signed)
° ° °  Pt husband called to say the following med is not working  ondansetron (ZOFRAN-ODT) 4 MG disintegrating tablet  Said he was told to call back if med did not work

## 2016-02-12 ENCOUNTER — Encounter (HOSPITAL_COMMUNITY): Payer: Self-pay | Admitting: Emergency Medicine

## 2016-02-12 ENCOUNTER — Emergency Department (HOSPITAL_COMMUNITY)
Admission: EM | Admit: 2016-02-12 | Discharge: 2016-02-12 | Disposition: A | Discharge status: Home / Self Care | Payer: Medicare Other | Attending: Emergency Medicine | Admitting: Emergency Medicine

## 2016-02-12 DIAGNOSIS — Z7982 Long term (current) use of aspirin: Secondary | ICD-10-CM | POA: Diagnosis not present

## 2016-02-12 DIAGNOSIS — I10 Essential (primary) hypertension: Secondary | ICD-10-CM | POA: Diagnosis not present

## 2016-02-12 DIAGNOSIS — R112 Nausea with vomiting, unspecified: Secondary | ICD-10-CM | POA: Diagnosis not present

## 2016-02-12 DIAGNOSIS — J449 Chronic obstructive pulmonary disease, unspecified: Secondary | ICD-10-CM | POA: Insufficient documentation

## 2016-02-12 DIAGNOSIS — R197 Diarrhea, unspecified: Secondary | ICD-10-CM | POA: Diagnosis not present

## 2016-02-12 DIAGNOSIS — Z79899 Other long term (current) drug therapy: Secondary | ICD-10-CM | POA: Insufficient documentation

## 2016-02-12 DIAGNOSIS — Z87891 Personal history of nicotine dependence: Secondary | ICD-10-CM | POA: Insufficient documentation

## 2016-02-12 LAB — CBC
HEMATOCRIT: 38.7 % (ref 36.0–46.0)
HEMOGLOBIN: 12.8 g/dL (ref 12.0–15.0)
MCH: 26.8 pg (ref 26.0–34.0)
MCHC: 33.1 g/dL (ref 30.0–36.0)
MCV: 81.1 fL (ref 78.0–100.0)
Platelets: 268 10*3/uL (ref 150–400)
RBC: 4.77 MIL/uL (ref 3.87–5.11)
RDW: 15.3 % (ref 11.5–15.5)
WBC: 7.5 10*3/uL (ref 4.0–10.5)

## 2016-02-12 LAB — URINALYSIS, ROUTINE W REFLEX MICROSCOPIC
Glucose, UA: NEGATIVE mg/dL
Hgb urine dipstick: NEGATIVE
Ketones, ur: NEGATIVE mg/dL
NITRITE: NEGATIVE
PH: 6 (ref 5.0–8.0)
Protein, ur: 30 mg/dL — AB
SPECIFIC GRAVITY, URINE: 1.024 (ref 1.005–1.030)

## 2016-02-12 LAB — COMPREHENSIVE METABOLIC PANEL
ALBUMIN: 4.6 g/dL (ref 3.5–5.0)
ALT: 17 U/L (ref 14–54)
ANION GAP: 13 (ref 5–15)
AST: 22 U/L (ref 15–41)
Alkaline Phosphatase: 156 U/L — ABNORMAL HIGH (ref 38–126)
BILIRUBIN TOTAL: 0.6 mg/dL (ref 0.3–1.2)
BUN: 19 mg/dL (ref 6–20)
CO2: 23 mmol/L (ref 22–32)
Calcium: 10.1 mg/dL (ref 8.9–10.3)
Chloride: 105 mmol/L (ref 101–111)
Creatinine, Ser: 1.06 mg/dL — ABNORMAL HIGH (ref 0.44–1.00)
GFR, EST AFRICAN AMERICAN: 58 mL/min — AB (ref >60)
GFR, EST NON AFRICAN AMERICAN: 50 mL/min — AB (ref >60)
Glucose, Bld: 143 mg/dL — ABNORMAL HIGH (ref 65–99)
POTASSIUM: 3.8 mmol/L (ref 3.5–5.1)
Sodium: 141 mmol/L (ref 135–145)
TOTAL PROTEIN: 8.5 g/dL — AB (ref 6.5–8.1)

## 2016-02-12 LAB — URINE MICROSCOPIC-ADD ON

## 2016-02-12 LAB — LIPASE, BLOOD: Lipase: 24 U/L (ref 11–51)

## 2016-02-12 MED ORDER — SODIUM CHLORIDE 0.9 % IV SOLN
Freq: Once | INTRAVENOUS | Status: AC
  Administered 2016-02-12: 11:00:00 via INTRAVENOUS

## 2016-02-12 MED ORDER — PROMETHAZINE HCL 25 MG/ML IJ SOLN
12.5000 mg | Freq: Once | INTRAMUSCULAR | Status: AC
  Administered 2016-02-12: 12.5 mg via INTRAVENOUS
  Filled 2016-02-12: qty 1

## 2016-02-12 MED ORDER — DIPHENOXYLATE-ATROPINE 2.5-0.025 MG PO TABS: 1.0000 | ORAL_TABLET | Freq: Four times a day (QID) | ORAL | 0 refills | Status: DC | PRN

## 2016-02-12 MED ORDER — PROMETHAZINE HCL 25 MG PO TABS: 25.0000 mg | ORAL_TABLET | Freq: Four times a day (QID) | ORAL | 0 refills | Status: DC | PRN

## 2016-02-12 MED ORDER — SODIUM CHLORIDE 0.9 % IV BOLUS (SEPSIS)
500.0000 mL | Freq: Once | INTRAVENOUS | Status: AC
  Administered 2016-02-12: 500 mL via INTRAVENOUS

## 2016-02-12 MED ORDER — ONDANSETRON HCL 4 MG/2ML IJ SOLN
4.0000 mg | Freq: Once | INTRAMUSCULAR | Status: AC | PRN
  Administered 2016-02-12: 4 mg via INTRAVENOUS
  Filled 2016-02-12: qty 2

## 2016-02-12 MED ORDER — FAMOTIDINE IN NACL 20-0.9 MG/50ML-% IV SOLN
20.0000 mg | Freq: Once | INTRAVENOUS | Status: AC
  Administered 2016-02-12: 20 mg via INTRAVENOUS
  Filled 2016-02-12: qty 50

## 2016-02-12 NOTE — ED Provider Notes (Signed)
[facility] DEPT Provider Note   CSN: [id number] Arrival date & time: 02/12/16  0755     History   Chief Complaint Chief Complaint  Patient presents with  . Emesis  . Headache    HPI Gina Fritz is a 75 y.o. female. She presents for evaluation of a 2 day illness. She started with diarrhea on Wednesday.*Primary care physician. No specific treatment or testing rendered. Yesterday developed some nausea. She developed a headache to the course of the day yesterday. She had similar episodes she states about 6 months ago where she had trouble with her blood pressure. She developed headaches and nausea while her blood pressure was high. She has history of hypertension. Was taken off antihypertensives during an episode of sepsis. Her blood pressure rebounded. Once her blood pressures controlled her symptoms resolved.  She states she was able to keep down her blood pressure medicine this morning for a short time.  No fever. No frank hematemesis. She states her emesis was "pink". No dark or black stools. She reports no abdominal pain or symptoms now or over the last 2 days. No chest pain. Headache is "better".  HPI  Past Medical History:  Diagnosis Date  . Allergic rhinitis   . Anxiety   . CAP (community acquired pneumonia)    dx 02-05-2015  . Colitis    dx 02-05-2015  . COPD with emphysema ([place])   . Depression   . Dizziness   . GERD (gastroesophageal reflux disease)   . History of chronic sinusitis   . History of kidney stones   . Hyperlipidemia   . Hypertension   . Irritable bowel syndrome   . Loss of weight   . Pulmonary nodules    benign and stable per CT  . Rash   . Right ureteral stone   . Sensorineural hearing loss, unilateral   . Subjective tinnitus     Patient Active Problem List   Diagnosis Date Noted  . Essential hypertension, benign 12/18/2015  . Hypertensive urgency 11/14/2015  . COPD with emphysema ([place]) 05/22/2015  . COPD with asthma ([place])  05/22/2015  . Malnutrition of moderate degree 02/09/2015  . Abdominal pain 02/05/2015  . Kidney stone on right side 02/05/2015  . Colitis 02/05/2015  . Unstable gait 04/22/2013    Past Surgical History:  Procedure Laterality Date  . CATARACT EXTRACTION Left   . CYSTO/  LEFT URETEROSCOPIC STONE EXTRACTION/ STENT PLACEMENT  08-10-2009  . ECTOPIC PREGNANCY SURGERY     and Appendectomy  . NASAL SINUS SURGERY    . REMOVAL CYST RIGHT ARM  age 76  . TUBAL LIGATION      OB History    No data available       Home Medications    Prior to Admission medications   Medication Sig Start Date End Date Taking? Authorizing Provider  acetaminophen (TYLENOL) 325 MG tablet Take 2 tablets (650 mg total) by mouth every 6 (six) hours as needed for mild pain (or Fever >/= 101). 11/20/15  Yes Reyne Dumas, MD  albuterol (PROAIR HFA) 108 (90 Base) MCG/ACT inhaler Inhale 2 puffs into the lungs every 6 (six) hours as needed for wheezing or shortness of breath. 06/23/15  Yes Chesley Mires, MD  alendronate (FOSAMAX) 70 MG tablet Take 70 mg by mouth every Sunday. 01/28/16  Yes Historical Provider, MD  amLODipine (NORVASC) 10 MG tablet Take 1 tablet (10 mg total) by mouth daily. 11/20/15  Yes Reyne Dumas, MD  aspirin EC 81 MG  tablet Take 81 mg by mouth daily.   Yes Historical Provider, MD  atorvastatin (LIPITOR) 40 MG tablet Take 40 mg by mouth daily.   Yes Historical Provider, MD  BESIVANCE 0.6 % SUSP Place 1 drop into the right eye See admin instructions. Takes the day before and the day of the dr visit 4 times a day 11/07/14  Yes Historical Provider, MD  Cholecalciferol (VITAMIN D) 2000 UNITS CAPS Take 2,000 Units by mouth daily.   Yes Historical Provider, MD  Cyanocobalamin (VITAMIN B-12 PO) Take 1 tablet by mouth daily.   Yes Historical Provider, MD  DONNATAL 16.2 MG tablet Take 16.2 tablets by mouth daily as needed (ibs).  04/18/13  Yes Historical Provider, MD  escitalopram (LEXAPRO) 20 MG tablet Take 20 mg by  mouth daily.  01/20/13  Yes Historical Provider, MD  famotidine (PEPCID) 40 MG tablet Take 40 mg by mouth daily.   Yes Historical Provider, MD  fluticasone (FLONASE) 50 MCG/ACT nasal spray Place 2 sprays into the nose daily.   Yes Historical Provider, MD  gabapentin (NEURONTIN) 300 MG capsule Take 1 capsule (300 mg total) by mouth 3 (three) times daily. 11/20/15  Yes Reyne Dumas, MD  influenza vac recom quadrivalent (FLUBLOK) 0.5 ML injection Inject 0.5 mLs into the muscle once.   Yes Historical Provider, MD  losartan (COZAAR) 100 MG tablet Take 1 tablet (100 mg total) by mouth daily. 11/20/15  Yes Reyne Dumas, MD  mometasone-formoterol (DULERA) 100-5 MCG/ACT AERO Inhale 2 puffs into the lungs 2 (two) times daily. 05/22/15  Yes Chesley Mires, MD  montelukast (SINGULAIR) 10 MG tablet Take 10 mg by mouth daily.    Yes Historical Provider, MD  Multiple Vitamin (MULTIVITAMIN WITH MINERALS) TABS Take 1 tablet by mouth daily.   Yes Historical Provider, MD  Multiple Vitamins-Minerals (ICAPS AREDS 2 PO) Take 1 tablet by mouth 2 (two) times daily.   Yes Historical Provider, MD  Niacin (VITAMIN B-3 PO) Take 1 tablet by mouth daily.   Yes Historical Provider, MD  ondansetron (ZOFRAN-ODT) 4 MG disintegrating tablet Take 1 tablet (4 mg total) by mouth every 8 (eight) hours as needed for nausea or vomiting. 02/11/16  Yes Burnis Medin, MD  pantoprazole (PROTONIX) 40 MG tablet Take 1 tablet (40 mg total) by mouth daily. 11/20/15  Yes Reyne Dumas, MD  polyethylene glycol (MIRALAX / GLYCOLAX) packet Take 17 g by mouth daily as needed for mild constipation.   Yes Historical Provider, MD  ranitidine (ZANTAC) 300 MG tablet Take 300 mg by mouth 2 (two) times daily.   Yes Historical Provider, MD  Riboflavin (VITAMIN B-2 PO) Take 1 tablet by mouth daily.   Yes Historical Provider, MD  solifenacin (VESICARE) 10 MG tablet Take 10 mg by mouth daily.    Yes Historical Provider, MD  sucralfate (CARAFATE) 1 GM/10ML suspension Take  1 g by mouth 4 (four) times daily.   Yes Historical Provider, MD  tamsulosin (FLOMAX) 0.4 MG CAPS capsule Take 1 capsule (0.4 mg total) by mouth daily after breakfast. 02/10/15  Yes Oswald Hillock, MD  diphenoxylate-atropine (LOMOTIL) 2.5-0.025 MG tablet Take 1 tablet by mouth 4 (four) times daily as needed for diarrhea or loose stools. 02/12/16   Tanna Furry, MD  promethazine (PHENERGAN) 25 MG tablet Take 1 tablet (25 mg total) by mouth every 6 (six) hours as needed for nausea or vomiting. 02/12/16   Tanna Furry, MD    Family History Family History  Problem Relation Age of Onset  .  Congestive Heart Failure Mother   . Hearing loss Mother   . Stroke Mother     syndrome  . Hypertension Father   . Other      thyroid disorder    Social History Social History  Substance Use Topics  . Smoking status: Former Smoker    Packs/day: 1.00    Years: 25.00    Types: Cigarettes    Quit date: 02/05/2015  . Smokeless tobacco: Never Used  . Alcohol use 0.6 oz/week    1 Glasses of wine per week     Comment: One drink a week.     Allergies   Penicillins; Sulfa antibiotics; and Soy allergy   Review of Systems Review of Systems  Constitutional: Negative for appetite change, chills, diaphoresis, fatigue and fever.  HENT: Negative for mouth sores, sore throat and trouble swallowing.   Eyes: Negative for visual disturbance.  Respiratory: Negative for cough, chest tightness, shortness of breath and wheezing.   Cardiovascular: Negative for chest pain.  Gastrointestinal: Positive for diarrhea, nausea and vomiting. Negative for abdominal distention and abdominal pain.  Endocrine: Negative for polydipsia, polyphagia and polyuria.  Genitourinary: Negative for dysuria, frequency and hematuria.  Musculoskeletal: Negative for gait problem.  Skin: Negative for color change, pallor and rash.  Neurological: Positive for headaches. Negative for dizziness, syncope and light-headedness.  Hematological: Does not  bruise/bleed easily.  Psychiatric/Behavioral: Negative for behavioral problems and confusion.     Physical Exam Updated Vital Signs BP (!) 133/47   Pulse 70   Temp 98.2 F (36.8 C) (Oral)   Resp 18   SpO2 97%   Physical Exam  Constitutional: She is oriented to person, place, and time. She appears well-developed and well-nourished. No distress.  Patient states her headache is considerably better. Recheck BP 122/78. This is quite reassuring to her.  HENT:  Head: Normocephalic.  Eyes: Conjunctivae are normal. Pupils are equal, round, and reactive to light. No scleral icterus.  Neck: Normal range of motion. Neck supple. No thyromegaly present.  Cardiovascular: Normal rate and regular rhythm.  Exam reveals no gallop and no friction rub.   No murmur heard. Pulmonary/Chest: Effort normal and breath sounds normal. No respiratory distress. She has no wheezes. She has no rales.  Abdominal: Soft. Bowel sounds are normal. She exhibits no distension. There is no tenderness. There is no rebound.  Soft benign abdomen. Normal active bowel sounds present. No focal tenderness. Reports no pain or tenderness  Musculoskeletal: Normal range of motion.  Neurological: She is alert and oriented to person, place, and time.  Skin: Skin is warm and dry. No rash noted.  Psychiatric: She has a normal mood and affect. Her behavior is normal.     ED Treatments / Results  Labs (all labs ordered are listed, but only abnormal results are displayed) Labs Reviewed  COMPREHENSIVE METABOLIC PANEL - Abnormal; Notable for the following:       Result Value   Glucose, Bld 143 (*)    Creatinine, Ser 1.06 (*)    Total Protein 8.5 (*)    Alkaline Phosphatase 156 (*)    GFR calc non Af Amer 50 (*)    GFR calc Af Amer 58 (*)    All other components within normal limits  URINALYSIS, ROUTINE W REFLEX MICROSCOPIC (NOT AT [facility]) - Abnormal; Notable for the following:    Color, Urine AMBER (*)    Bilirubin Urine SMALL (*)     Protein, ur 30 (*)    Leukocytes, UA  TRACE (*)    All other components within normal limits  URINE MICROSCOPIC-ADD ON - Abnormal; Notable for the following:    Squamous Epithelial / LPF 0-5 (*)    Bacteria, UA RARE (*)    All other components within normal limits  LIPASE, BLOOD  CBC    EKG  EKG Interpretation None       Radiology No results found.  Procedures Procedures (including critical care time)  Medications Ordered in ED Medications  ondansetron (ZOFRAN) injection 4 mg (4 mg Intravenous Given 02/12/16 0912)  promethazine (PHENERGAN) injection 12.5 mg (12.5 mg Intravenous Given 02/12/16 0952)  sodium chloride 0.9 % bolus 500 mL (0 mLs Intravenous Stopped 02/12/16 1031)  0.9 %  sodium chloride infusion ( Intravenous New Bag/Given 02/12/16 1032)  famotidine (PEPCID) IVPB 20 mg premix (0 mg Intravenous Stopped 02/12/16 1031)     Initial Impression / Assessment and Plan / ED Course  I have reviewed the triage vital signs and the nursing notes.  Pertinent labs & imaging results that were available during my care of the patient were reviewed by me and considered in my medical decision making (see chart for details).  Clinical Course     Feeling improved and taking by mouth after IV Phenergan and fluids. Urine does not appear infected. Alkaline phosphatase slightly elevated. Remainder of her hepatobiliary enzymes are normal and she has no abdominal pain. Creatinine up slightly at 1.06 from the baseline is 0.8 consistent with minimal acute kidney injury. I think this is amenable to oral rehydration and close follow-up. No leukocytosis. Otherwise feeling well. Appropriate for home  Final Clinical Impressions(s) / ED Diagnoses   Final diagnoses:  Nausea vomiting and diarrhea    New Prescriptions New Prescriptions   DIPHENOXYLATE-ATROPINE (LOMOTIL) 2.5-0.025 MG TABLET    Take 1 tablet by mouth 4 (four) times daily as needed for diarrhea or loose stools.    PROMETHAZINE (PHENERGAN) 25 MG TABLET    Take 1 tablet (25 mg total) by mouth every 6 (six) hours as needed for nausea or vomiting.     Tanna Furry, MD 02/12/16 1141

## 2016-02-12 NOTE — ED Triage Notes (Signed)
Pt reports large volume diarrhea on Wednesday, saw PCP, then nausea, HA, pink/tan emesis. Pt unable to tolerate food or fluids, vomited up antihypertensive medicine today.

## 2016-02-16 ENCOUNTER — Encounter (HOSPITAL_COMMUNITY): Payer: Medicare Other

## 2016-02-17 ENCOUNTER — Ambulatory Visit (INDEPENDENT_AMBULATORY_CARE_PROVIDER_SITE_OTHER): Payer: Medicare Other | Admitting: Family Medicine

## 2016-02-17 ENCOUNTER — Encounter: Payer: Self-pay | Admitting: Family Medicine

## 2016-02-17 VITALS — BP 120/70 | HR 67 | Temp 97.9°F | Resp 12 | Ht 62.0 in | Wt 117.0 lb

## 2016-02-17 DIAGNOSIS — K219 Gastro-esophageal reflux disease without esophagitis: Secondary | ICD-10-CM

## 2016-02-17 DIAGNOSIS — R112 Nausea with vomiting, unspecified: Secondary | ICD-10-CM | POA: Diagnosis not present

## 2016-02-17 DIAGNOSIS — R519 Headache, unspecified: Secondary | ICD-10-CM

## 2016-02-17 DIAGNOSIS — R51 Headache: Secondary | ICD-10-CM | POA: Diagnosis not present

## 2016-02-17 MED ORDER — OMEPRAZOLE 20 MG PO CPDR: 20.0000 mg | DELAYED_RELEASE_CAPSULE | Freq: Every day | ORAL | 1 refills | Status: DC

## 2016-02-17 MED ORDER — AMLODIPINE BESYLATE 10 MG PO TABS
10.0000 mg | ORAL_TABLET | Freq: Every day | ORAL | 3 refills | Status: DC
Start: 2016-02-17 — End: 2016-10-19

## 2016-02-17 NOTE — Progress Notes (Signed)
Pre visit review using our clinic review tool, if applicable. No additional management support is needed unless otherwise documented below in the visit note. 

## 2016-02-17 NOTE — Patient Instructions (Addendum)
A few things to remember from today's visit:   Nausea and vomiting in adult patient  Gastroesophageal reflux disease, esophagitis presence not specified - Plan: omeprazole (PRILOSEC) 20 MG capsule  Headache, unspecified headache type  Headache: Resume Gabapentin.     Small and frequent sips of clear fluids, Pedialyte or Gatorade age good options.  BRAT diet: bananas, rice, applesauce, and toast if tolerated.  Phenergan decrease dose to 1/4-1/2 tab, fall precautions. Continue Zofran.  Omeprazole 20 mg daily.   If persistent vomiting stop solids for 24 hours then resume liquid/soft diet and advance as tolerated.   Notified immediately or seek medical attention if you cannot keep fluids down, decreased urine output, severe abdominal pain, red bright blood per rectum, or changes in mental status.  Follow if not full recovery in 2-3 weeks.       Please be sure medication list is accurate. If a new problem present, please set up appointment sooner than planned today.

## 2016-02-17 NOTE — Progress Notes (Signed)
HPI:  ACUTE VISIT:  Chief Complaint  Patient presents with  . Nausea    [name] is a 75 y.o. female, who is here today with her husband complaining of nausea, vomiting, and headache.   According to patient, symptoms started about 7 days ago. Last OV, she was reporting similar symptoms and frequent visits + hospitalizations.  02/10/16 she was here in the office evaluated for diarrhea and nausea, diarrhea has resolved. Last bowel movement yesterday, "normal"  She was evaluated in the ER on 02/12/2016, discharged on Zofran and Phenergan, the latter one she is not tolerating well because it causes severe drowsiness. Last time she took early this morning. Last episode of vomiting was about 3 days ago. She denies any abdominal pain, blood in the stool, melena, or urinary symptoms. No known sick contact. No recent travel. She denies any new medication.  Nausea is exacerbated by oral intake, husband is concerned because she is not drinking fluids. She denies any decrease in urine output, urine is dark, denies hematuria or dysuria.   + Burping and heartburn. Hx of GERD, currently she is on ranitidine, she took PPI years ago.  She has been hospitalized 11/2015 because of similar symptoms she is reporting today, including headache. Symptoms were finally attributed to poorly controlled hypertension, she is currently on Amlodipine and Cozaar, she is not checking BP at home.  According to patient, headache is mils, 2/10, not as bad as the headache she had in 11/2015 but similar localization. She denies associated visual changes, numbness, or focal deficit. Past but has not noted any MS changes. She is currently following with neurologist, who recommended, painting for management of headaches. She states that she is taking Gabapentin on a no, she has not taken it for the past 1-2 weeks.  Denies chest pain, dyspnea, palpitation, or edema.  Abdominal CT 02/05/15: 1.  Obstructing right ureteral stone measuring 3 mm at the ureterovesical junction. 2. Enhancement of the right ureteral wall consistent with infection/inflammation. 3. Enhancement of the mucosa of the ascending colonic wall, this issue with mild wall thickening. Findings are consistent with colitis. 4. Distended gallbladder and mildly distended intrahepatic biliary ducts. This appears to be a new finding.  5. Coronary artery disease. 6. Atherosclerosis of the thoracic and abdominal aorta. 7. Mild splenomegaly.  Abdominal U/S 02/05/15:  1. Sludge within the gallbladder lumen but no secondary sonographic findings of acute cholecystitis. 2. Simple parapelvic cyst in the interpolar right kidney. 3. No right-sided hydronephrosis by ultrasound.  Brain MRI 11/16/15: No acute intracranial process.   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  HENT: Negative for facial swelling, mouth sores, nosebleeds, postnasal drip and trouble swallowing.   Eyes: Negative for  visual disturbance.  Respiratory: Negative for cough, shortness of breath and wheezing.   Cardiovascular: Negative for palpitations and leg swelling.  Gastrointestinal: Positive for nausea and vomiting. Negative for abdominal pain and blood in stool.  Endocrine: Negative for cold  intolerance, heat intolerance, polydipsia and polyphagia.  Genitourinary: Negative for decreased urine volume, dysuria and hematuria.  Musculoskeletal: Negative for gait problem and myalgias.  Skin: Negative for color change and rash.  Neurological: Negative for syncope, weakness and headaches.  Hematological: Negative for adenopathy.  Psychiatric/Behavioral: Negative for confusion and sleep disturbance.      Current Outpatient Prescriptions on File Prior to Visit  Medication Sig Dispense Refill  . acetaminophen (TYLENOL) 325 MG tablet Take 2 tablets (650 mg total) by mouth every 6 (six) hours as needed for mild pain (or Fever >/= 101). 30 tablet 1  . albuterol (PROAIR HFA) 108 (90 Base) MCG/ACT inhaler Inhale 2 puffs into the lungs every 6 (six) hours as needed for wheezing or shortness of breath. 1 Inhaler 2  . alendronate (FOSAMAX) 70 MG tablet Take 70 mg by mouth every Sunday.    Marland Kitchen aspirin EC 81 MG tablet Take 81 mg by mouth daily.    Marland Kitchen atorvastatin (LIPITOR) 40 MG tablet Take 40 mg by mouth daily.    Marland Kitchen BESIVANCE 0.6 % SUSP Place 1 drop into the right eye See admin instructions. Takes the day before and the day of the dr visit 4 times a day    . Cholecalciferol (VITAMIN D) 2000 UNITS CAPS Take 2,000 Units by mouth daily.    . Cyanocobalamin (VITAMIN B-12 PO) Take 1 tablet by mouth daily.    . diphenoxylate-atropine (LOMOTIL) 2.5-0.025 MG tablet Take 1 tablet by mouth 4 (four) times daily as needed for diarrhea or loose stools. 30 tablet 0  . DONNATAL 16.2 MG tablet Take 16.2 tablets by mouth daily as needed (ibs).     Marland Kitchen escitalopram (LEXAPRO) 20 MG tablet Take 20 mg by mouth daily.     . famotidine (PEPCID) 40 MG tablet Take 40 mg by mouth daily.    . fluticasone (FLONASE) 50 MCG/ACT nasal spray Place 2 sprays into the nose daily.    Marland Kitchen gabapentin (NEURONTIN) 300 MG capsule Take 1 capsule (300 mg total) by mouth 3 (three) times daily. 90 capsule 1  . influenza vac recom quadrivalent  (FLUBLOK) 0.5 ML injection Inject 0.5 mLs into the muscle once.    Marland Kitchen losartan (COZAAR) 100 MG tablet Take 1 tablet (100 mg total) by mouth daily. 30 tablet 1  . mometasone-formoterol (DULERA) 100-5 MCG/ACT AERO Inhale 2 puffs into the lungs 2 (two) times daily. 1 Inhaler 5  . montelukast (SINGULAIR) 10 MG tablet Take 10 mg by mouth daily.     . Multiple Vitamin (MULTIVITAMIN WITH MINERALS) TABS Take 1 tablet by mouth daily.    . Multiple Vitamins-Minerals (ICAPS AREDS 2 PO) Take 1 tablet by mouth 2 (two) times daily.    . Niacin (VITAMIN B-3 PO) Take 1 tablet by mouth daily.    . ondansetron (ZOFRAN-ODT) 4 MG disintegrating tablet Take 1 tablet (4 mg total) by mouth every 8 (eight) hours as needed for nausea or vomiting. 12 tablet 0  . pantoprazole (PROTONIX) 40 MG tablet Take 1 tablet (40 mg total) by mouth daily. 30 tablet 1  . polyethylene glycol (MIRALAX / GLYCOLAX) packet Take 17 g by mouth daily as needed for mild constipation.    . promethazine (PHENERGAN) 25 MG tablet Take 1  tablet (25 mg total) by mouth every 6 (six) hours as needed for nausea or vomiting. 10 tablet 0  . ranitidine (ZANTAC) 300 MG tablet Take 300 mg by mouth 2 (two) times daily.    . Riboflavin (VITAMIN B-2 PO) Take 1 tablet by mouth daily.    . solifenacin (VESICARE) 10 MG tablet Take 10 mg by mouth daily.     . sucralfate (CARAFATE) 1 GM/10ML suspension Take 1 g by mouth 4 (four) times daily.    . tamsulosin (FLOMAX) 0.4 MG CAPS capsule Take 1 capsule (0.4 mg total) by mouth daily after breakfast. 30 capsule 2   No current facility-administered medications on file prior to visit.      Past Medical History:  Diagnosis Date  . Allergic rhinitis   . Anxiety   . CAP (community acquired pneumonia)    dx 02-05-2015  . Colitis    dx 02-05-2015  . COPD with emphysema ([place])   . Depression   . Dizziness   . GERD (gastroesophageal reflux disease)   . History of chronic sinusitis   . History of kidney stones   .  Hyperlipidemia   . Hypertension   . Irritable bowel syndrome   . Loss of weight   . Pulmonary nodules    benign and stable per CT  . Rash   . Right ureteral stone   . Sensorineural hearing loss, unilateral   . Subjective tinnitus    Allergies  Allergen Reactions  . Penicillins Anaphylaxis    Has patient had a PCN reaction causing immediate rash, facial/tongue/throat swelling, SOB or lightheadedness with hypotension: Yes Has patient had a PCN reaction causing severe rash involving mucus membranes or skin necrosis: Yes Has patient had a PCN reaction that required hospitalization Yes Has patient had a PCN reaction occurring within the last 10 years: No If all of the above answers are "NO", then may proceed with Cephalosporin use.   . Sulfa Antibiotics Hives  . Soy Allergy Itching and Rash    Social History   Social History  . Marital status: Married    Spouse name: Gina Fritz  . Number of children: 3  . Years of education: college   Occupational History  .      Retired   Social History Main Topics  . Smoking status: Former Smoker    Packs/day: 1.00    Years: 25.00    Types: Cigarettes    Quit date: 02/05/2015  . Smokeless tobacco: Never Used  . Alcohol use 0.6 oz/week    1 Glasses of wine per week     Comment: One drink a week.  . Drug use: No  . Sexual activity: Not Asked   Other Topics Concern  . None   Social History Narrative   Patient is married Gina Fritz) and lives at home with her husband.   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   Caffeine- Six cups of coffee daily and six cups of coke cola daily.          Vitals:   02/17/16 1042  BP: 120/70  Pulse: 67  Resp: 12  Temp: 97.9 F (36.6 C)   O2 sat 98% at RA  Body mass index is 21.4 kg/m.    Physical Exam  Nursing note and vitals reviewed. Constitutional: She is oriented to person, place, and time. She appears well-developed. She does not appear ill. No  distress.  HENT:  Head: Atraumatic.  Mouth/Throat:  Oropharynx is clear and moist and mucous membranes are normal.  Eyes: Conjunctivae and EOM are normal. No scleral icterus.  Neck: No JVD present.  Cardiovascular: Normal rate and regular rhythm.   No murmur heard. Respiratory: Effort normal and breath sounds normal. No respiratory distress.  GI: Soft. Bowel sounds are normal. She exhibits no distension and no mass. There is no hepatomegaly. There is no tenderness.  Burping a few times during visit.  Musculoskeletal: She exhibits no edema or tenderness.  Lymphadenopathy:    She has no cervical adenopathy.       Right: No supraclavicular adenopathy present.       Left: No supraclavicular adenopathy present.  Neurological: She is alert and oriented to person, place, and time. She has normal strength. No cranial nerve deficit. Coordination normal.  Skin: Skin is warm. No rash noted. No erythema.  Psychiatric: Her mood appears anxious.  Well groomed, poor eye contact.      ASSESSMENT AND PLAN:     Adorah was seen today for nausea.  Diagnoses and all orders for this visit:    Nausea and vomiting in adult patient  Vomiting has resolved. Still very nauseated, we discussed possible etiologies: dyspepsia, GERD,gall bladder disease,gastroparesis among some. Imaging done within the past year due to similar symptom do not explain symptom. She will continue Zofran, recommended trying low dose of Phenergan (1/4-1/2 tab), small and frequent sips of fluids (Gatorade or Pedialyte). She doesn't want lab work done today to follow on renal function, we discussed possible complications of dehydration, clearly instructed about warning signs. Follow-up in a week, when I am planning to repeating BMP.  For now she will hold on some of her medications: Lipitor, Fosamax.  Gastroesophageal reflux disease, esophagitis presence not specified  GERD precautions discussed. Continue ranitidine. She will  try low dose of omeprazole, we discussed some side effects that at this time I feel like she might benefit from the medication. Follow-up in a week. -     omeprazole (PRILOSEC) 20 MG capsule; Take 1 capsule (20 mg total) by mouth daily.  Headache, unspecified headache type  Neurologic examination otherwise normal. Recommended resuming Gabapentin, some side effects discussed. Clearly instructed about warning signs. Next neuro f/u 03/2016.   Other orders -     amLODipine (NORVASC) 10 MG tablet; Take 1 tablet (10 mg total) by mouth daily.     Return in about 1 week (around 02/24/2016) for N,V,Headache.     -[name] was advised to return or notify a doctor immediately if symptoms worsen or new concerns arise before f/u visit.       Eva Vallee G. [place], MD  [facility] [place] office.

## 2016-02-18 ENCOUNTER — Encounter (HOSPITAL_COMMUNITY): Payer: Medicare Other

## 2016-02-23 ENCOUNTER — Encounter (HOSPITAL_COMMUNITY): Payer: Medicare Other

## 2016-02-24 ENCOUNTER — Ambulatory Visit (INDEPENDENT_AMBULATORY_CARE_PROVIDER_SITE_OTHER): Payer: Medicare Other | Admitting: Family Medicine

## 2016-02-24 ENCOUNTER — Encounter: Payer: Self-pay | Admitting: Family Medicine

## 2016-02-24 VITALS — BP 128/78 | HR 73 | Resp 12 | Ht 62.0 in | Wt 121.4 lb

## 2016-02-24 DIAGNOSIS — R748 Abnormal levels of other serum enzymes: Secondary | ICD-10-CM

## 2016-02-24 DIAGNOSIS — R51 Headache: Secondary | ICD-10-CM

## 2016-02-24 DIAGNOSIS — E785 Hyperlipidemia, unspecified: Secondary | ICD-10-CM

## 2016-02-24 DIAGNOSIS — J4489 Other specified chronic obstructive pulmonary disease: Secondary | ICD-10-CM

## 2016-02-24 DIAGNOSIS — R519 Headache, unspecified: Secondary | ICD-10-CM

## 2016-02-24 DIAGNOSIS — J449 Chronic obstructive pulmonary disease, unspecified: Secondary | ICD-10-CM | POA: Diagnosis not present

## 2016-02-24 DIAGNOSIS — R112 Nausea with vomiting, unspecified: Secondary | ICD-10-CM

## 2016-02-24 DIAGNOSIS — M810 Age-related osteoporosis without current pathological fracture: Secondary | ICD-10-CM | POA: Diagnosis not present

## 2016-02-24 LAB — LIPID PANEL
CHOL/HDL RATIO: 4
Cholesterol: 200 mg/dL (ref 0–200)
HDL: 46.1 mg/dL (ref >39.00)
LDL Cholesterol: 121 mg/dL — ABNORMAL HIGH (ref 0–99)
NONHDL: 153.54
Triglycerides: 162 mg/dL — ABNORMAL HIGH (ref 0.0–149.0)
VLDL: 32.4 mg/dL (ref 0.0–40.0)

## 2016-02-24 LAB — HEPATIC FUNCTION PANEL
ALK PHOS: 141 U/L — AB (ref 39–117)
ALT: 13 U/L (ref 0–35)
AST: 15 U/L (ref 0–37)
Albumin: 4.3 g/dL (ref 3.5–5.2)
BILIRUBIN TOTAL: 0.6 mg/dL (ref 0.2–1.2)
Bilirubin, Direct: 0.1 mg/dL (ref 0.0–0.3)
Total Protein: 7.3 g/dL (ref 6.0–8.3)

## 2016-02-24 LAB — BASIC METABOLIC PANEL
BUN: 15 mg/dL (ref 6–23)
CALCIUM: 9.9 mg/dL (ref 8.4–10.5)
CO2: 25 mEq/L (ref 19–32)
CREATININE: 0.87 mg/dL (ref 0.40–1.20)
Chloride: 105 mEq/L (ref 96–112)
GFR: 67.53 mL/min (ref >60.00)
GLUCOSE: 105 mg/dL — AB (ref 70–99)
Potassium: 4 mEq/L (ref 3.5–5.1)
Sodium: 140 mEq/L (ref 135–145)

## 2016-02-24 MED ORDER — MONTELUKAST SODIUM 10 MG PO TABS: 10.0000 mg | ORAL_TABLET | Freq: Every day | ORAL | 1 refills | Status: DC

## 2016-02-24 MED ORDER — GABAPENTIN 300 MG PO CAPS: 300.0000 mg | ORAL_CAPSULE | Freq: Three times a day (TID) | ORAL | 1 refills | Status: DC

## 2016-02-24 MED ORDER — ATORVASTATIN CALCIUM 20 MG PO TABS: 20.0000 mg | ORAL_TABLET | Freq: Every day | ORAL | 2 refills | Status: DC

## 2016-02-24 NOTE — Patient Instructions (Signed)
A few things to remember from today's visit:   Nausea and vomiting in adult - Plan: Basic metabolic panel  Hyperlipidemia, unspecified hyperlipidemia type - Plan: atorvastatin (LIPITOR) 20 MG tablet  Osteoporosis, unspecified osteoporosis type, unspecified pathological fracture presence  COPD with asthma ([place]) - Plan: montelukast (SINGULAIR) 10 MG tablet  Headache, unspecified headache type - Plan: gabapentin (NEURONTIN) 300 MG capsule  Continue GERD precautions: Small and frequent meals, no fluid intake within 3 hours before bedtime. Let's hold on Fosamax for now.Other options: Reclast or Prolia.  Fall precautions, avoid engaging in activities that increase the risk of falls. Keep appointment with neurologist and pulmonologist. Continue Phenergan 1/4 tablet twice daily as needed.  Continue omeprazole.   Please be sure medication list is accurate. If a new problem present, please set up appointment sooner than planned today.

## 2016-02-24 NOTE — Progress Notes (Signed)
Pre visit review using our clinic review tool, if applicable. No additional management support is needed unless otherwise documented below in the visit note. 

## 2016-02-24 NOTE — Progress Notes (Signed)
HPI:   Gina Fritz is a 75 y.o. female, who is here today with her husband, daughter came in the room in later during [place]. She is here to follow on last OV, 02/17/16,when she was seen because persistent nausea,vomiting, and headache.  Prior to last OV she had been in the ER a few times due to similar symptoms, last ED visit 02/12/2016.  In general she is reporting improvement, at least 50%. Headache has improved, "better", she has had a couple episodes since her last OV, mild. She resumed Gabapentin 300 mg 3 times per day.  She has not had nausea for 2 days, no vomiting for the past 10 days. She'll discontinue Zofran because he does not help with nausea, she has taken Phenergan 25 mg 1/4 tab a couple times, causes sleepiness but not as severe as when she was taking a whole tablet; Phenergan helps.  She has history of GERD, last OV we agree with starting Omeprazole 20 mg daily. She stopped Pepcid.   Pain and heartburn have resolved. Denies abdominal pain, changes in bowel habits, blood in stool or melena.  Still fatigue, which she has had for about a year.  She has history of depression, she does not feel like depression is contributing to her fatigue but rather all medical issues she has had in the past year. 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     Chemistry      Component Value Date/Time   NA 141 02/12/2016 0853   K 3.8 02/12/2016 0853   CL 105 02/12/2016 0853   CO2 23 02/12/2016 0853   BUN 19 02/12/2016 0853   CREATININE 1.06 (H) 02/12/2016 0853      Component Value Date/Time   CALCIUM 10.1 02/12/2016 0853   ALKPHOS 156 (H) 02/12/2016 0853   AST 22 02/12/2016 0853   ALT 17 02/12/2016 0853   BILITOT 0.6 02/12/2016 0853       Concerns today: medication refills. Requesting a handicap sticker.   Hyperlipidemia:  Currently on Lipitor 20 mg, she decreased dose from 40 mg to 20 (1/2 tab). Following a low fat diet: Yes.  She has not noted side effects with medication. She is not sure about her last lipid panel.   Osteoporosis: Started approximately months about 1-2 months ago, she is still taking medication. Alk phos was elevated 02/12/16 and renal function was mildly abnormal.  She has not noted any edema, decreased urine output, gross hematuria, or foamy urine. She is drinking frequent and small sips of Coke, Gatorade, and water.  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 She was not able to complete the pulmonary rehabilitation. Currently she is following with pulmonologist, she is on Dulera 100-5 mcg and albuterol. Symptoms otherwise stable, she has not had cough or wheezing.   Review of Systems  Constitutional: Positive for appetite change (improved.) and fatigue. Negative for chills and fever.  HENT: Positive for congestion and rhinorrhea. Negative for mouth sores, nosebleeds, sore throat and trouble swallowing.   Respiratory: Positive for shortness of breath (at baseline). Negative for cough and wheezing.   Cardiovascular: Negative for chest pain, palpitations and leg swelling.  Gastrointestinal: Negative for abdominal pain, blood in  stool and vomiting.  Genitourinary: Negative for decreased urine volume, difficulty urinating and hematuria.  Musculoskeletal: Positive for gait problem (stable, has a cane). Negative for myalgias.  Allergic/Immunologic: Positive for environmental allergies.  Neurological: Positive for headaches (improved). Negative for syncope and weakness.  Psychiatric/Behavioral: Negative for confusion, sleep disturbance and suicidal ideas. The patient is nervous/anxious.       Current  Outpatient Prescriptions on File Prior to Visit  Medication Sig Dispense Refill  . acetaminophen (TYLENOL) 325 MG tablet Take 2 tablets (650 mg total) by mouth every 6 (six) hours as needed for mild pain (or Fever >/= 101). 30 tablet 1  . albuterol (PROAIR HFA) 108 (90 Base) MCG/ACT inhaler Inhale 2 puffs into the lungs every 6 (six) hours as needed for wheezing or shortness of breath. 1 Inhaler 2  . alendronate (FOSAMAX) 70 MG tablet Take 70 mg by mouth every Sunday.    Marland Kitchen amLODipine (NORVASC) 10 MG tablet Take 1 tablet (10 mg total) by mouth daily. 30 tablet 3  . aspirin EC 81 MG tablet Take 81 mg by mouth daily.    Marland Kitchen BESIVANCE 0.6 % SUSP Place 1 drop into the right eye See admin instructions. Takes the day before and the day of the dr visit 4 times a day    . Cholecalciferol (VITAMIN D) 2000 UNITS CAPS Take 2,000 Units by mouth daily.    . Cyanocobalamin (VITAMIN B-12 PO) Take 1 tablet by mouth daily.    . diphenoxylate-atropine (LOMOTIL) 2.5-0.025 MG tablet Take 1 tablet by mouth 4 (four) times daily as needed for diarrhea or loose stools. 30 tablet 0  . DONNATAL 16.2 MG tablet Take 16.2 tablets by mouth daily as needed (ibs).     Marland Kitchen escitalopram (LEXAPRO) 20 MG tablet Take 20 mg by mouth daily.     . fluticasone (FLONASE) 50 MCG/ACT nasal spray Place 2 sprays into the nose daily.    . influenza vac recom quadrivalent (FLUBLOK) 0.5 ML injection Inject 0.5 mLs into the muscle once.    Marland Kitchen losartan (COZAAR) 100 MG tablet Take 1 tablet (100 mg total) by mouth daily. 30 tablet 1  . mometasone-formoterol (DULERA) 100-5 MCG/ACT AERO Inhale 2 puffs into the lungs 2 (two) times daily. 1 Inhaler 5  . Multiple Vitamin (MULTIVITAMIN WITH MINERALS) TABS Take 1 tablet by mouth daily.    . Multiple Vitamins-Minerals (ICAPS AREDS 2 PO) Take 1 tablet by mouth 2 (two) times daily.    . Niacin (VITAMIN B-3 PO) Take 1 tablet by mouth daily.    Marland Kitchen omeprazole (PRILOSEC) 20 MG capsule Take 1 capsule (20 mg total) by  mouth daily. 30 capsule 1  . ondansetron (ZOFRAN-ODT) 4 MG disintegrating tablet Take 1 tablet (4 mg total) by mouth every 8 (eight) hours as needed for nausea or vomiting. 12 tablet 0  . pantoprazole (PROTONIX) 40 MG tablet Take 1 tablet (40 mg total) by mouth daily. 30 tablet 1  . polyethylene glycol (MIRALAX / GLYCOLAX) packet Take 17 g by mouth daily as needed for mild constipation.    . promethazine (PHENERGAN) 25 MG tablet Take 1 tablet (25 mg total) by mouth every 6 (six) hours as needed for nausea or vomiting. 10 tablet 0  . ranitidine (ZANTAC) 300 MG tablet Take 300 mg by mouth 2 (two) times daily.    . Riboflavin (VITAMIN B-2 PO) Take 1 tablet by mouth daily.    . solifenacin (VESICARE) 10 MG tablet Take 10 mg by mouth daily.     Marland Kitchen  sucralfate (CARAFATE) 1 GM/10ML suspension Take 1 g by mouth 4 (four) times daily.    . tamsulosin (FLOMAX) 0.4 MG CAPS capsule Take 1 capsule (0.4 mg total) by mouth daily after breakfast. 30 capsule 2   No current facility-administered medications on file prior to visit.      Past Medical History:  Diagnosis Date  . Allergic rhinitis   . Anxiety   . CAP (community acquired pneumonia)    dx 02-05-2015  . Colitis    dx 02-05-2015  . COPD with emphysema ([place])   . Depression   . Dizziness   . GERD (gastroesophageal reflux disease)   . History of chronic sinusitis   . History of kidney stones   . Hyperlipidemia   . Hypertension   . Irritable bowel syndrome   . Loss of weight   . Pulmonary nodules    benign and stable per CT  . Rash   . Right ureteral stone   . Sensorineural hearing loss, unilateral   . Subjective tinnitus    Allergies  Allergen Reactions  . Penicillins Anaphylaxis    Has patient had a PCN reaction causing immediate rash, facial/tongue/throat swelling, SOB or lightheadedness with hypotension: Yes Has patient had a PCN reaction causing severe rash involving mucus membranes or skin necrosis: Yes Has patient had a PCN  reaction that required hospitalization Yes Has patient had a PCN reaction occurring within the last 10 years: No If all of the above answers are "NO", then may proceed with Cephalosporin use.   . Sulfa Antibiotics Hives  . Soy Allergy Itching and Rash    Social History   Social History  . Marital status: Married    Spouse name: [name]  . Number of children: 3  . Years of education: college   Occupational History  .      Retired   Social History Main Topics  . Smoking status: Former Smoker    Packs/day: 1.00    Years: 25.00    Types: Cigarettes    Quit date: 02/05/2015  . Smokeless tobacco: Never Used  . Alcohol use 0.6 oz/week    1 Glasses of wine per week     Comment: One drink a week.  . Drug use: No  . Sexual activity: Not Asked   Other Topics Concern  . None   Social History Narrative   Patient is married [name]) and lives at home with her husband.   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   Caffeine- Six cups of coffee daily and six cups of coke cola daily.          Vitals:   02/24/16 0747  BP: 128/78  Pulse: 73  Resp: 12    O2 sat at RA 98%   Body mass index is 22.2 kg/m.  Wt Readings from Last 3 Encounters:  02/24/16 121 lb 6 oz (55.1 kg)  02/17/16 117 lb (53.1 kg)  02/10/16 123 lb 12.8 oz (56.2 kg)       Physical Exam  Nursing note and vitals reviewed. Constitutional: She is oriented to person, place, and time. She appears well-developed. She does not appear ill. No distress.  HENT:  Head: Atraumatic.  Mouth/Throat: Oropharynx is clear and moist and mucous membranes are normal.  Eyes: Conjunctivae and EOM are normal. Pupils are equal, round, and reactive to light.  Cardiovascular: Normal rate and regular rhythm.   No murmur heard. Pulses:  Dorsalis pedis pulses are 2+ on the right side, and 2+ on the left side.  Respiratory: Effort normal and breath sounds normal. No respiratory  distress.  GI: Soft. She exhibits no mass. There is no hepatomegaly. There is no tenderness.  Musculoskeletal: She exhibits no edema.  Neurological: She is alert and oriented to person, place, and time. She has normal strength. Coordination normal.  Stable gait with a cane.  Skin: Skin is warm. No erythema.  Psychiatric: Her speech is normal. Her mood appears anxious.  Well groomed, good eye contact.      ASSESSMENT AND PLAN:     Alixis was seen today for follow-up.  Diagnoses and all orders for this visit:  Nausea and vomiting in adult  Great improvement. She will continue omeprazole 20 mg daily and Phenergan 6.25 mg twice daily as needed (1/4 tab). Adequate hydration, continue GERD precautions, instructed about warning signs. Today we will recheck renal function, which was slightly abnormal during last ER visit.  -     Basic metabolic panel  COPD with asthma ([place])  Overall stable. Handicapped sticker given today. Resume Singulair 10 mg daily at bedtime. No changes in rest of medications. Keep appointment with pulmonologists.  -     montelukast (SINGULAIR) 10 MG tablet; Take 1 tablet (10 mg total) by mouth daily.  Elevated alkaline phosphatase level  Further recommendations would be given according to lab results.  -     Hepatic function panel  Headache, unspecified headache type  Great improvement. Continue gabapentin 300 mg 3 times per day. Keep appointment with neurologist, Dr Dione Booze 03/07/16.  -     gabapentin (NEURONTIN) 300 MG capsule; Take 1 capsule (300 mg total) by mouth 3 (three) times daily.  Osteoporosis, unspecified osteoporosis type, unspecified pathological fracture presence  We discussed some side effects of Fosamax as well as other treatment options. Recommend for now holding on Fosamax until nausea is completely resolved. Fall precautions discussed in detail. Continue calcium and vitamin D supplementation.  Hyperlipidemia, unspecified  hyperlipidemia type  No changes in current management, will follow labs done today and will give further recommendations accordingly.  -     atorvastatin (LIPITOR) 20 MG tablet; Take 1 tablet (20 mg total) by mouth daily. -     Lipid panel       -Ms. Katalyn Matin was advised to return sooner than planned today if new concerns arise.       Murial Beam G. [place], MD  [facility] [place] office.

## 2016-02-25 ENCOUNTER — Encounter (HOSPITAL_COMMUNITY): Payer: Medicare Other

## 2016-02-29 ENCOUNTER — Telehealth: Payer: Self-pay | Admitting: Emergency Medicine

## 2016-02-29 DIAGNOSIS — I10 Essential (primary) hypertension: Secondary | ICD-10-CM | POA: Diagnosis not present

## 2016-02-29 DIAGNOSIS — E785 Hyperlipidemia, unspecified: Secondary | ICD-10-CM | POA: Diagnosis not present

## 2016-02-29 NOTE — Telephone Encounter (Signed)
Spoke with pt and explained lab results. Pt verbalized understanding.

## 2016-03-01 ENCOUNTER — Encounter (HOSPITAL_COMMUNITY): Payer: Medicare Other

## 2016-03-03 ENCOUNTER — Encounter (HOSPITAL_COMMUNITY): Payer: Medicare Other

## 2016-03-04 ENCOUNTER — Ambulatory Visit (INDEPENDENT_AMBULATORY_CARE_PROVIDER_SITE_OTHER): Payer: Medicare Other | Admitting: Pulmonary Disease

## 2016-03-04 ENCOUNTER — Encounter: Payer: Self-pay | Admitting: Pulmonary Disease

## 2016-03-04 VITALS — BP 122/66 | HR 54 | Ht 62.0 in | Wt 123.2 lb

## 2016-03-04 DIAGNOSIS — J432 Centrilobular emphysema: Secondary | ICD-10-CM | POA: Diagnosis not present

## 2016-03-04 DIAGNOSIS — R911 Solitary pulmonary nodule: Secondary | ICD-10-CM | POA: Diagnosis not present

## 2016-03-04 NOTE — Patient Instructions (Signed)
Will arrange for CT chest  Will arrange for referral back to pulmonary rehab  Follow up in 6 months

## 2016-03-04 NOTE — Progress Notes (Signed)
Current Outpatient Prescriptions on File Prior to Visit  Medication Sig  . acetaminophen (TYLENOL) 325 MG tablet Take 2 tablets (650 mg total) by mouth every 6 (six) hours as needed for mild pain (or Fever >/= 101).  Marland Kitchen albuterol (PROAIR HFA) 108 (90 Base) MCG/ACT inhaler Inhale 2 puffs into the lungs every 6 (six) hours as needed for wheezing or shortness of breath.  Marland Kitchen alendronate (FOSAMAX) 70 MG tablet Take 70 mg by mouth every Sunday.  Marland Kitchen amLODipine (NORVASC) 10 MG tablet Take 1 tablet (10 mg total) by mouth daily. (Patient taking differently: Take 5 mg by mouth daily. )  . aspirin EC 81 MG tablet Take 81 mg by mouth daily.  Marland Kitchen atorvastatin (LIPITOR) 20 MG tablet Take 1 tablet (20 mg total) by mouth daily.  Marland Kitchen BESIVANCE 0.6 % SUSP Place 1 drop into the right eye See admin instructions. Takes the day before and the day of the dr visit 4 times a day  . Cholecalciferol (VITAMIN D) 2000 UNITS CAPS Take 2,000 Units by mouth daily.  . Cyanocobalamin (VITAMIN B-12 PO) Take 1 tablet by mouth daily.  . diphenoxylate-atropine (LOMOTIL) 2.5-0.025 MG tablet Take 1 tablet by mouth 4 (four) times daily as needed for diarrhea or loose stools.  . DONNATAL 16.2 MG tablet Take 16.2 tablets by mouth daily as needed (ibs).   Marland Kitchen escitalopram (LEXAPRO) 20 MG tablet Take 20 mg by mouth daily.   . fluticasone (FLONASE) 50 MCG/ACT nasal spray Place 2 sprays into the nose daily.  Marland Kitchen gabapentin (NEURONTIN) 300 MG capsule Take 1 capsule (300 mg total) by mouth 3 (three) times daily.  . influenza vac recom quadrivalent (FLUBLOK) 0.5 ML injection Inject 0.5 mLs into the muscle once.  Marland Kitchen losartan (COZAAR) 100 MG tablet Take 1 tablet (100 mg total) by mouth daily.  . mometasone-formoterol (DULERA) 100-5 MCG/ACT AERO Inhale 2 puffs into the lungs 2 (two) times daily.  . montelukast (SINGULAIR) 10 MG tablet Take 1 tablet (10 mg total) by mouth daily.  . Multiple Vitamin (MULTIVITAMIN WITH MINERALS) TABS Take 1 tablet by mouth daily.   . Multiple Vitamins-Minerals (ICAPS AREDS 2 PO) Take 1 tablet by mouth 2 (two) times daily.  . Niacin (VITAMIN B-3 PO) Take 1 tablet by mouth daily.  Marland Kitchen omeprazole (PRILOSEC) 20 MG capsule Take 1 capsule (20 mg total) by mouth daily.  . ondansetron (ZOFRAN-ODT) 4 MG disintegrating tablet Take 1 tablet (4 mg total) by mouth every 8 (eight) hours as needed for nausea or vomiting.  . pantoprazole (PROTONIX) 40 MG tablet Take 1 tablet (40 mg total) by mouth daily.  . polyethylene glycol (MIRALAX / GLYCOLAX) packet Take 17 g by mouth daily as needed for mild constipation.  . promethazine (PHENERGAN) 25 MG tablet Take 1 tablet (25 mg total) by mouth every 6 (six) hours as needed for nausea or vomiting.  . Riboflavin (VITAMIN B-2 PO) Take 1 tablet by mouth daily.  . solifenacin (VESICARE) 10 MG tablet Take 10 mg by mouth daily.   . tamsulosin (FLOMAX) 0.4 MG CAPS capsule Take 1 capsule (0.4 mg total) by mouth daily after breakfast.   No current facility-administered medications on file prior to visit.      Chief Complaint  Patient presents with  . Follow-up    42mo rov. pt states breathing is up & down. pt c/o sob with exertion, occ wheezing & increased fatigued.     Pulmonary tests CT chest 02/05/15 >> moderate emphysema PFT 05/22/15 >> FEV1  1.54 (78%), FEV1% 70, TLC 6.22 (130%), DLCO 46%, no BD  Past medical history HTN, Depression, IBS, Tinnitus, Anxiety, Nephrolithiasis, GERD, HLD  Past surgical history, Family history, Social history, Allergies reviewed  Vital Signs BP 122/66 (BP Location: Left Arm, Cuff Size: Normal)   Pulse (!) 54   Ht 5\' 2"  (1.575 m)   Wt 123 lb 3.2 oz (55.9 kg)   SpO2 94%   BMI 22.53 kg/m   History of Present Illness Gina Fritz is a 75 y.o. female former smoker with COPD/emphysema and asthma.  She has trouble getting fatigued easily.  She doesn't feel like she has energy like she did before she had pneumonia.  She is not having cough, wheeze, or  sputum.  She is not using albuterol much.  She denies chest pain, or leg swelling.    She wants to get back into pulmonary rehab.  She had CT head/neck in August 2017.  Upper cuts of her lungs showed 5 mm nodule in the right upper lobe.  She was able to maintain her oxygen level with ambulatory oximetry on room air today.  Physical Exam  General - No distress, thin ENT - No sinus tenderness, no oral exudate, no LAN Cardiac - s1s2 regular, no murmur Chest - No wheeze/rales/dullness Back - No focal tenderness Abd - Soft, non-tender Ext - No edema Neuro - Normal strength Skin - No rashes Psych - normal mood, and behavior   Assessment/Plan  COPD with emphysema and asthma. - continue dulera, singulair with prn albuterol - resume pulmonary rehab maintenance classes  Right upper lung nodule. - will arrange for CT chest w/o contrast to further assess   Patient Instructions  Will arrange for CT chest  Will arrange for referral back to pulmonary rehab  Follow up in 6 months    Chesley Mires, MD [place] [facility] Pager:  [phone] 03/04/2016, 9:17 AM

## 2016-03-07 DIAGNOSIS — D1802 Hemangioma of intracranial structures: Secondary | ICD-10-CM | POA: Diagnosis not present

## 2016-03-07 DIAGNOSIS — R112 Nausea with vomiting, unspecified: Secondary | ICD-10-CM | POA: Diagnosis not present

## 2016-03-07 DIAGNOSIS — R42 Dizziness and giddiness: Secondary | ICD-10-CM | POA: Diagnosis not present

## 2016-03-07 DIAGNOSIS — E538 Deficiency of other specified B group vitamins: Secondary | ICD-10-CM | POA: Diagnosis not present

## 2016-03-07 DIAGNOSIS — R4189 Other symptoms and signs involving cognitive functions and awareness: Secondary | ICD-10-CM | POA: Diagnosis not present

## 2016-03-07 DIAGNOSIS — R55 Syncope and collapse: Secondary | ICD-10-CM | POA: Diagnosis not present

## 2016-03-07 DIAGNOSIS — R410 Disorientation, unspecified: Secondary | ICD-10-CM | POA: Diagnosis not present

## 2016-03-07 DIAGNOSIS — I10 Essential (primary) hypertension: Secondary | ICD-10-CM | POA: Diagnosis not present

## 2016-03-07 DIAGNOSIS — Z87891 Personal history of nicotine dependence: Secondary | ICD-10-CM | POA: Diagnosis not present

## 2016-03-07 DIAGNOSIS — R51 Headache: Secondary | ICD-10-CM | POA: Diagnosis not present

## 2016-03-07 DIAGNOSIS — Q283 Other malformations of cerebral vessels: Secondary | ICD-10-CM | POA: Diagnosis not present

## 2016-03-08 ENCOUNTER — Encounter (HOSPITAL_COMMUNITY): Payer: Medicare Other

## 2016-03-09 DIAGNOSIS — I10 Essential (primary) hypertension: Secondary | ICD-10-CM | POA: Diagnosis not present

## 2016-03-09 DIAGNOSIS — Z9181 History of falling: Secondary | ICD-10-CM | POA: Diagnosis not present

## 2016-03-09 DIAGNOSIS — M810 Age-related osteoporosis without current pathological fracture: Secondary | ICD-10-CM | POA: Diagnosis not present

## 2016-03-09 DIAGNOSIS — R5381 Other malaise: Secondary | ICD-10-CM | POA: Diagnosis not present

## 2016-03-10 ENCOUNTER — Encounter (HOSPITAL_COMMUNITY): Payer: Medicare Other

## 2016-03-11 ENCOUNTER — Ambulatory Visit (INDEPENDENT_AMBULATORY_CARE_PROVIDER_SITE_OTHER)
Admission: RE | Admit: 2016-03-11 | Discharge: 2016-03-11 | Disposition: A | Discharge status: Home / Self Care | Payer: Medicare Other | Source: Ambulatory Visit | Attending: Pulmonary Disease | Admitting: Pulmonary Disease

## 2016-03-11 DIAGNOSIS — R918 Other nonspecific abnormal finding of lung field: Secondary | ICD-10-CM | POA: Diagnosis not present

## 2016-03-11 DIAGNOSIS — R911 Solitary pulmonary nodule: Secondary | ICD-10-CM

## 2016-03-14 ENCOUNTER — Encounter (HOSPITAL_COMMUNITY)
Admission: RE | Admit: 2016-03-14 | Discharge: 2016-03-14 | Disposition: A | Discharge status: Home / Self Care | Payer: Medicare Other | Source: Ambulatory Visit | Attending: Pulmonary Disease | Admitting: Pulmonary Disease

## 2016-03-14 ENCOUNTER — Encounter (HOSPITAL_COMMUNITY): Payer: Self-pay

## 2016-03-14 VITALS — BP 133/62 | HR 61 | Ht 62.5 in | Wt 124.3 lb

## 2016-03-14 DIAGNOSIS — J432 Centrilobular emphysema: Secondary | ICD-10-CM

## 2016-03-14 DIAGNOSIS — J439 Emphysema, unspecified: Secondary | ICD-10-CM | POA: Diagnosis not present

## 2016-03-14 NOTE — Progress Notes (Signed)
Gina Fritz 75 y.o. female Pulmonary Rehab Orientation Note Patient arrived today in Cardiac and Pulmonary Rehab for orientation to Pulmonary Rehab. She was transported from [employer] via wheel chair. She does not carry portable oxygen. Per pt, she uses oxygen never. Color good, skin warm and dry. Patient is oriented to time and place. Patient's medical history, psychosocial health, and medications reviewed. Psychosocial assessment reveals pt lives with their spouse. 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 Pt states she has no hobbies, we discussed the importance of having interests she enjoys.  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  Pt reports her stress level is low. She states she has very little stress.  Pt does not exhibit  signs of depression.  PHQ2/9 score 0/1. Pt shows good  coping skills with positive outlook .  Will continue to monitor and evaluate if any psychosocial issues arise. Physical assessment reveals heart rate is normal, breath sounds clear to auscultation, no wheezes, rales, or rhonchi. Grip strength equal, strong. Distal pulses 2+ bilateral posterior tibial pulses present without peripheral edema. Patient reports she does take medications as prescribed. Patient states she follows a Regular diet. The patient reports no specific efforts to gain or lose weight.. Patient's weight will be monitored closely. Demonstration and practice of PLB using pulse oximeter. Patient able to return demonstration satisfactorily. Safety and hand hygiene in the exercise area reviewed with patient. Patient voices understanding of the information reviewed. Department expectations discussed with patient and achievable goals were set. The patient shows enthusiasm about attending the program and we look forward to working with this nice lady. The patient is scheduled for a 6 min walk test on Tuesday, March 15, 2016 @ 3:30 pm and to begin exercise on Tuesday, March 22, 2016 in the 10:30 am class.   [id number]

## 2016-03-15 ENCOUNTER — Encounter (HOSPITAL_COMMUNITY)
Admission: RE | Admit: 2016-03-15 | Discharge: 2016-03-15 | Disposition: A | Discharge status: Home / Self Care | Payer: Medicare Other | Source: Ambulatory Visit | Attending: Pulmonary Disease | Admitting: Pulmonary Disease

## 2016-03-15 ENCOUNTER — Encounter (HOSPITAL_COMMUNITY): Payer: Medicare Other

## 2016-03-15 DIAGNOSIS — J439 Emphysema, unspecified: Secondary | ICD-10-CM | POA: Diagnosis not present

## 2016-03-15 DIAGNOSIS — J432 Centrilobular emphysema: Secondary | ICD-10-CM

## 2016-03-15 NOTE — Progress Notes (Signed)
Pulmonary Individual Treatment Plan  Patient Details  Name: Gina Fritz MRN: [id number] Date of Birth: 07-21-1940 Referring Provider:   April Manson Pulmonary Rehab Walk Test from 03/15/2016 in [place]  Referring Provider  Dr. Halford Chessman      Initial Encounter Date:  Flowsheet Row Pulmonary Rehab Walk Test from 03/15/2016 in [place]  Date  03/15/16  Referring Provider  Dr. 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  acetaminophen (TYLENOL) 325 MG tablet, Take 2 tablets (650 mg total) by mouth every 6 (six) hours as needed for mild pain (or Fever >/= 101)., Disp: 30 tablet, Rfl: 1 .  albuterol (PROAIR HFA) 108 (90 Base) MCG/ACT inhaler, Inhale 2 puffs into the lungs every 6 (six) hours as needed for wheezing or shortness of breath., Disp: 1 Inhaler, Rfl: 2 .  alendronate (FOSAMAX) 70 MG tablet, Take 70 mg by mouth every Sunday., Disp: , Rfl:  .  amLODipine (NORVASC) 10 MG tablet, Take 1 tablet (10 mg total) by mouth daily. (Patient taking differently: Take 5 mg by mouth daily. ), Disp: 30 tablet, Rfl: 3 .  aspirin EC 81 MG tablet, Take 81 mg by mouth daily., Disp: , Rfl:  .  atorvastatin (LIPITOR) 20 MG tablet, Take 1 tablet (20 mg total) by mouth daily., Disp: 90 tablet, Rfl: 2 .  BESIVANCE 0.6 % SUSP, Place 1 drop into the right eye See admin instructions. Takes the day before and the day of the dr visit 4 times a day, Disp: , Rfl:  .  Cholecalciferol (VITAMIN D) 2000 UNITS CAPS, Take 2,000 Units by mouth daily., Disp: , Rfl:  .  Cyanocobalamin (VITAMIN B-12 PO), Take 1 tablet by mouth daily., Disp: , Rfl:  .  diphenoxylate-atropine (LOMOTIL) 2.5-0.025 MG tablet, Take 1 tablet by mouth 4 (four) times daily as needed for diarrhea or loose stools., Disp: 30 tablet,  Rfl: 0 .  DONNATAL 16.2 MG tablet, Take 16.2 tablets by mouth daily as needed (ibs). , Disp: , Rfl:  .  escitalopram (LEXAPRO) 20 MG tablet, Take 20 mg by mouth daily. , Disp: , Rfl:  .  fluticasone (FLONASE) 50 MCG/ACT nasal spray, Place 2 sprays into the nose daily., Disp: , Rfl:  .  gabapentin (NEURONTIN) 300 MG capsule, Take 1 capsule (300 mg total) by mouth 3 (three) times daily., Disp: 90 capsule, Rfl: 1 .  influenza vac recom quadrivalent (FLUBLOK) 0.5 ML injection, Inject 0.5 mLs into the muscle once., Disp: , Rfl:  .  losartan (COZAAR) 100 MG tablet, Take 1 tablet (100 mg total) by mouth daily. (Patient taking differently: Take 50 mg by mouth 2 (two) times daily. ), Disp: 30 tablet, Rfl: 1 .  mometasone-formoterol (DULERA) 100-5 MCG/ACT AERO, Inhale 2 puffs into the lungs 2 (two) times daily., Disp: 1 Inhaler, Rfl: 5 .  montelukast (SINGULAIR) 10 MG tablet, Take 1 tablet (10 mg total) by mouth daily., Disp: 90 tablet, Rfl: 1 .  Multiple Vitamin (MULTIVITAMIN WITH MINERALS) TABS, Take 1 tablet by mouth daily., Disp: , Rfl:  .  Multiple Vitamins-Minerals (ICAPS AREDS 2 PO), Take 1 tablet by mouth 2 (two) times daily., Disp: , Rfl:  .  Niacin (VITAMIN B-3 PO), Take 1 tablet by mouth daily., Disp: , Rfl:  .  omeprazole (PRILOSEC) 20 MG capsule, Take 1  capsule (20 mg total) by mouth daily., Disp: 30 capsule, Rfl: 1 .  ondansetron (ZOFRAN-ODT) 4 MG disintegrating tablet, Take 1 tablet (4 mg total) by mouth every 8 (eight) hours as needed for nausea or vomiting., Disp: 12 tablet, Rfl: 0 .  pantoprazole (PROTONIX) 40 MG tablet, Take 1 tablet (40 mg total) by mouth daily. (Patient not taking: Reported on 03/14/2016), Disp: 30 tablet, Rfl: 1 .  polyethylene glycol (MIRALAX / GLYCOLAX) packet, Take 17 g by mouth daily as needed for mild constipation., Disp: , Rfl:  .  promethazine (PHENERGAN) 25 MG tablet, Take 1 tablet (25 mg total) by mouth every 6 (six) hours as needed for nausea or vomiting., Disp:  10 tablet, Rfl: 0 .  Riboflavin (VITAMIN B-2 PO), Take 1 tablet by mouth daily., Disp: , Rfl:  .  solifenacin (VESICARE) 10 MG tablet, Take 10 mg by mouth daily. , Disp: , Rfl:  .  tamsulosin (FLOMAX) 0.4 MG CAPS capsule, Take 1 capsule (0.4 mg total) by mouth daily after breakfast. (Patient not taking: Reported on 03/14/2016), Disp: 30 capsule, Rfl: 2  Past Medical History: Past Medical History:  Diagnosis Date  . Allergic rhinitis   . Anxiety   . CAP (community acquired pneumonia)    dx 02-05-2015  . Colitis    dx 02-05-2015  . COPD with emphysema ([place])   . Depression   . Dizziness   . GERD (gastroesophageal reflux disease)   . History of chronic sinusitis   . History of kidney stones   . Hyperlipidemia   . Hypertension   . Irritable bowel syndrome   . Loss of weight   . Pulmonary nodules    benign and stable per CT  . Rash   . Right ureteral stone   . Sensorineural hearing loss, unilateral   . Subjective tinnitus     Tobacco Use: History  Smoking Status  . Former Smoker  . Packs/day: 1.00  . Years: 25.00  . Types: Cigarettes  . Quit date: 02/05/2015  Smokeless Tobacco  . Never Used    Labs: Recent Review Flowsheet Data    Labs for ITP Cardiac and Pulmonary Rehab Latest Ref Rng & Units 11/17/2015 02/24/2016   Cholestrol 0 - 200 mg/dL - 200   LDLCALC 0 - 99 mg/dL - 121(H)   HDL >39.00 mg/dL - 46.10   Trlycerides 0.0 - 149.0 mg/dL - 162.0(H)   Hemoglobin A1c 4.8 - 5.6 % 4.8 -      Capillary Blood Glucose: Lab Results  Component Value Date   GLUCAP 112 (H) 02/09/2015     ADL UCSD:   Pulmonary Function Assessment:     Pulmonary Function Assessment - 03/14/16 1250      Breath   Bilateral Breath Sounds Clear   Shortness of Breath Yes;Limiting activity      Exercise Target Goals: Date: 03/15/16  Exercise Program Goal: Individual exercise prescription set with THRR, safety & activity barriers. Participant demonstrates ability to understand and  report RPE using BORG scale, to self-measure pulse accurately, and to acknowledge the importance of the exercise prescription.  Exercise Prescription Goal: Starting with aerobic activity 30 plus minutes a day, 3 days per week for initial exercise prescription. Provide home exercise prescription and guidelines that participant acknowledges understanding prior to discharge.  Activity Barriers & Risk Stratification:     Activity Barriers & Cardiac Risk Stratification - 03/14/16 1248      Activity Barriers & Cardiac Risk Stratification   Activity Barriers Balance  Concerns;Deconditioning      6 Minute Walk:     6 Minute Walk    Row Name 03/15/16 1550         6 Minute Walk   Phase Initial     Distance 810 feet     Walk Time 6 minutes     # of Rest Breaks 0     MPH 1.53     METS 2.15     RPE 12     Perceived Dyspnea  1     Symptoms No     Resting HR 62 bpm     Resting BP 130/72     Max Ex. HR 101 bpm     Max Ex. BP 130/80       Interval HR   Baseline HR 62     1 Minute HR 101     2 Minute HR 70     3 Minute HR 70     4 Minute HR 70     5 Minute HR 96     6 Minute HR 76     2 Minute Post HR 71     Interval Heart Rate? Yes       Interval Oxygen   Interval Oxygen? Yes     Baseline Oxygen Saturation % 95 %     Baseline Liters of Oxygen 0 L     1 Minute Oxygen Saturation % 92 %     1 Minute Liters of Oxygen 0 L     2 Minute Oxygen Saturation % 94 %     2 Minute Liters of Oxygen 0 L     3 Minute Oxygen Saturation % 94 %     3 Minute Liters of Oxygen 0 L     4 Minute Oxygen Saturation % 94 %     4 Minute Liters of Oxygen 0 L     5 Minute Oxygen Saturation % 96 %     5 Minute Liters of Oxygen 0 L     6 Minute Oxygen Saturation % 95 %     6 Minute Liters of Oxygen 0 L     2 Minute Post Oxygen Saturation % 95 %     2 Minute Post Liters of Oxygen 0 L        Initial Exercise Prescription:     Initial Exercise Prescription - 03/15/16 1500      Date of Initial  Exercise RX and Referring Provider   Date 03/15/16   Referring Provider Dr. Halford Chessman     NuStep   Level 2   Minutes 17   METs 1.5     Arm Ergometer   Level 1   Minutes 17   METs 1.5     Track   Laps 5   Minutes 17     Prescription Details   Frequency (times per week) 2   Duration Progress to 45 minutes of aerobic exercise without signs/symptoms of physical distress     Intensity   THRR 40-80% of Max Heartrate 58-117   Ratings of Perceived Exertion 11-13   Perceived Dyspnea 0-4     Progression   Progression Continue progressive overload as per policy without signs/symptoms or physical distress.     Resistance Training   Training Prescription Yes   Weight orange bands   Reps 10-12      Perform Capillary Blood Glucose checks as needed.  Exercise Prescription Changes:   Exercise Comments:   Discharge  Exercise Prescription (Final Exercise Prescription Changes):    Nutrition:  Target Goals: Understanding of nutrition guidelines, daily intake of sodium 1500mg , cholesterol 200mg , calories 30% from fat and 7% or less from saturated fats, daily to have 5 or more servings of fruits and vegetables.  Biometrics:     Pre Biometrics - 03/14/16 1252      Pre Biometrics   Grip Strength 22 kg       Nutrition Therapy Plan and Nutrition Goals:   Nutrition Discharge: Rate Your Plate Scores:   Psychosocial: Target Goals: Acknowledge presence or absence of depression, maximize coping skills, provide positive support system. Participant is able to verbalize types and ability to use techniques and skills needed for reducing stress and depression.  Initial Review & Psychosocial Screening:     Initial Psych Review & Screening - 03/14/16 1257      Initial Review   Current issues with --  none identified     Family Dynamics   Good Support System? Yes     Barriers   Psychosocial barriers to participate in program There are no identifiable barriers or psychosocial  needs.     Screening Interventions   Interventions Encouraged to exercise      Quality of Life Scores:   PHQ-9: Recent Review Flowsheet Data    Depression screen [facility] 2/9 03/14/2016 10/27/2015 07/06/2015   Decreased Interest 0 0 0   Down, Depressed, Hopeless 1 0 1   PHQ - 2 Score 1 0 1      Psychosocial Evaluation and Intervention:     Psychosocial Evaluation - 03/14/16 1258      Psychosocial Evaluation & Interventions   Interventions (P)  Encouraged to exercise with the program and follow exercise prescription   Continued Psychosocial Services Needed (P)  No     Discharge Psychosocial Assessment & Intervention   Discharge (P)  Continue support measures as needed      Psychosocial Re-Evaluation:  Education: Education Goals: Education classes will be provided on a weekly basis, covering required topics. Participant will state understanding/return demonstration of topics presented.  Learning Barriers/Preferences:     Learning Barriers/Preferences - 03/14/16 1248      Learning Barriers/Preferences   Learning Barriers None   Learning Preferences Written Material;Verbal Instruction;Video;Skilled Demonstration;Pictoral;Individual Instruction;Group Instruction;Audio      Education Topics: Risk Factor Reduction:  -Group instruction that is supported by a PowerPoint presentation. Instructor discusses the definition of a risk factor, different risk factors for pulmonary disease, and how the heart and lungs work together.     Nutrition for Pulmonary Patient:  -Group instruction provided by PowerPoint slides, verbal discussion, and written materials to support subject matter. The instructor gives an explanation and review of healthy diet recommendations, which includes a discussion on weight management, recommendations for fruit and vegetable consumption, as well as protein, fluid, caffeine, fiber, sodium, sugar, and alcohol. Tips for eating when patients are short of breath  are discussed. Flowsheet Row PULMONARY REHAB OTHER RESPIRATORY from 10/15/2015 in [place]  Date  09/03/15  Educator  RD  Instruction Review Code  2- meets goals/outcomes      Pursed Lip Breathing:  -Group instruction that is supported by demonstration and informational handouts. Instructor discusses the benefits of pursed lip and diaphragmatic breathing and detailed demonstration on how to preform both.   Flowsheet Row PULMONARY REHAB OTHER RESPIRATORY from 10/15/2015 in [place]  Date  08/27/15  Educator  EP  Instruction  Review Code  2- meets goals/outcomes      Oxygen Safety:  -Group instruction provided by PowerPoint, verbal discussion, and written material to support subject matter. There is an overview of "What is Oxygen" and "Why do we need it".  Instructor also reviews how to create a safe environment for oxygen use, the importance of using oxygen as prescribed, and the risks of noncompliance. There is a brief discussion on traveling with oxygen and resources the patient may utilize. Flowsheet Row PULMONARY REHAB OTHER RESPIRATORY from 10/15/2015 in [place]  Date  09/17/15  Educator  RN  Instruction Review Code  2- meets goals/outcomes      Oxygen Equipment:  -Group instruction provided by [facility] Staff utilizing handouts, written materials, and equipment demonstrations. Flowsheet Row PULMONARY REHAB OTHER RESPIRATORY from 10/15/2015 in [place]  Date  07/23/15  Educator  Ace Gins Rep  Instruction Review Code  2- meets goals/outcomes      Signs and Symptoms:  -Group instruction provided by written material and verbal discussion to support subject matter. Warning signs and symptoms of infection, stroke, and heart attack are reviewed and when to call the physician/911 reinforced. Tips for preventing the spread of infection  discussed. Flowsheet Row PULMONARY REHAB OTHER RESPIRATORY from 10/15/2015 in [place]  Date  10/15/15  Educator  RN  Instruction Review Code  2- meets goals/outcomes      Advanced Directives:  -Group instruction provided by verbal instruction and written material to support subject matter. Instructor reviews Advanced Directive laws and proper instruction for filling out document. Flowsheet Row PULMONARY REHAB OTHER RESPIRATORY from 10/15/2015 in [place]  Date  08/20/15  Educator  Jeanella Craze  Instruction Review Code  2- meets goals/outcomes      Pulmonary Video:  -Group video education that reviews the importance of medication and oxygen compliance, exercise, good nutrition, pulmonary hygiene, and pursed lip and diaphragmatic breathing for the pulmonary patient. Flowsheet Row PULMONARY REHAB OTHER RESPIRATORY from 10/15/2015 in [place]  Date  09/10/15  Instruction Review Code  2- meets goals/outcomes      Exercise for the Pulmonary Patient:  -Group instruction that is supported by a PowerPoint presentation. Instructor discusses benefits of exercise, core components of exercise, frequency, duration, and intensity of an exercise routine, importance of utilizing pulse oximetry during exercise, safety while exercising, and options of places to exercise outside of rehab.   Flowsheet Row PULMONARY REHAB OTHER RESPIRATORY from 10/15/2015 in [place]  Date  10/08/15  Educator  EP  Instruction Review Code  2- meets goals/outcomes      Pulmonary Medications:  -Verbally interactive group education provided by instructor with focus on inhaled medications and proper administration. Flowsheet Row PULMONARY REHAB OTHER RESPIRATORY from 10/15/2015 in [place]  Date  07/16/15  Educator  RT  Instruction Review Code  2- meets  goals/outcomes      Anatomy and Physiology of the Respiratory System and Intimacy:  -Group instruction provided by PowerPoint, verbal discussion, and written material to support subject matter. Instructor reviews respiratory cycle and anatomical components of the respiratory system and their functions. Instructor also reviews differences in obstructive and restrictive respiratory diseases with examples of each. Intimacy, Sex, and Sexuality differences are reviewed with a discussion on how relationships can change when diagnosed with pulmonary disease. Common sexual concerns are  reviewed. Flowsheet Row PULMONARY REHAB OTHER RESPIRATORY from 10/15/2015 in [place]  Date  08/13/15  Educator  RN  Instruction Review Code  2- meets goals/outcomes      Knowledge Questionnaire Score:   Core Components/Risk Factors/Patient Goals at Admission:     Personal Goals and Risk Factors at Admission - 03/14/16 1252      Core Components/Risk Factors/Patient Goals on Admission   Increase Strength and Stamina Yes   Improve shortness of breath with ADL's Yes   Develop more efficient breathing techniques such as purse lipped breathing and diaphragmatic breathing; and practicing self-pacing with activity Yes      Core Components/Risk Factors/Patient Goals Review:    Core Components/Risk Factors/Patient Goals at Discharge (Final Review):    ITP Comments:   Comments:

## 2016-03-16 ENCOUNTER — Telehealth: Payer: Self-pay | Admitting: Pulmonary Disease

## 2016-03-16 DIAGNOSIS — M6281 Muscle weakness (generalized): Secondary | ICD-10-CM | POA: Diagnosis not present

## 2016-03-16 DIAGNOSIS — R42 Dizziness and giddiness: Secondary | ICD-10-CM | POA: Diagnosis not present

## 2016-03-16 DIAGNOSIS — R410 Disorientation, unspecified: Secondary | ICD-10-CM | POA: Diagnosis not present

## 2016-03-16 DIAGNOSIS — R6889 Other general symptoms and signs: Secondary | ICD-10-CM | POA: Diagnosis not present

## 2016-03-16 NOTE — Telephone Encounter (Signed)
CT chest 03/11/16 >> scattered b/l nodules   Spoke with pt.  She will need f/u CT w/o contrast in December 2018.

## 2016-03-17 ENCOUNTER — Encounter (HOSPITAL_COMMUNITY): Payer: Medicare Other

## 2016-03-17 ENCOUNTER — Ambulatory Visit: Payer: Medicare Other | Admitting: Family Medicine

## 2016-03-22 ENCOUNTER — Encounter (HOSPITAL_COMMUNITY)
Admission: RE | Admit: 2016-03-22 | Discharge: 2016-03-22 | Disposition: A | Discharge status: Home / Self Care | Payer: Medicare Other | Source: Ambulatory Visit | Attending: Pulmonary Disease | Admitting: Pulmonary Disease

## 2016-03-22 ENCOUNTER — Encounter (HOSPITAL_COMMUNITY): Payer: Medicare Other

## 2016-03-22 VITALS — Wt 127.6 lb

## 2016-03-22 DIAGNOSIS — J439 Emphysema, unspecified: Secondary | ICD-10-CM | POA: Diagnosis not present

## 2016-03-22 DIAGNOSIS — J432 Centrilobular emphysema: Secondary | ICD-10-CM

## 2016-03-22 NOTE — Progress Notes (Signed)
Daily Session Note  Patient Details  Name: Gina Fritz MRN: [id number] Date of Birth: Aug 23, 1940 Referring Provider:   April Manson Pulmonary Rehab Walk Test from 03/15/2016 in [place]  Referring Provider  Dr. Halford Chessman      Encounter Date: 03/22/2016  Check In:     Session Check In - 03/22/16 1208      Check-In   Location [facility]      Capillary Blood Glucose: No results found for this or any previous visit (from the past 24 hour(s)).      Exercise Prescription Changes - 03/22/16 1200      Response to Exercise   Blood Pressure (Admit) 120/68   Blood Pressure (Exercise) 130/60   Blood Pressure (Exit) 118/70   Heart Rate (Admit) 63 bpm   Heart Rate (Exercise) 69 bpm   Heart Rate (Exit) 59 bpm   Oxygen Saturation (Admit) 98 %   Oxygen Saturation (Exercise) 95 %   Oxygen Saturation (Exit) 97 %   Rating of Perceived Exertion (Exercise) 15   Perceived Dyspnea (Exercise) 2   Duration Progress to 45 minutes of aerobic exercise without signs/symptoms of physical distress   Intensity THRR unchanged     Progression   Progression Continue progressive overload as per policy without signs/symptoms or physical distress.     Resistance Training   Training Prescription Yes   Weight orange bands   Reps 10-12     NuStep   Level 1   Minutes 17   METs 1.4     Arm Ergometer   Level 1   Minutes 17     Track   Laps 9   Minutes 17     Goals Met:  Exercise tolerated well No report of cardiac concerns or symptoms Strength training completed today  Goals Unmet:  Not Applicable  Comments: Service time is from 10:30am to 12:00pm    Dr. Rush Farmer is Medical Director for Pulmonary Rehab at [facility].

## 2016-03-24 ENCOUNTER — Encounter (HOSPITAL_COMMUNITY)
Admission: RE | Admit: 2016-03-24 | Discharge: 2016-03-24 | Disposition: A | Discharge status: Home / Self Care | Payer: Medicare Other | Source: Ambulatory Visit | Attending: Pulmonary Disease | Admitting: Pulmonary Disease

## 2016-03-24 ENCOUNTER — Encounter (HOSPITAL_COMMUNITY): Payer: Medicare Other

## 2016-03-24 VITALS — Wt 127.4 lb

## 2016-03-24 DIAGNOSIS — J439 Emphysema, unspecified: Secondary | ICD-10-CM | POA: Diagnosis not present

## 2016-03-24 DIAGNOSIS — J432 Centrilobular emphysema: Secondary | ICD-10-CM

## 2016-03-24 NOTE — Progress Notes (Signed)
Daily Session Note  Patient Details  Name: Gina Fritz MRN: [id number] Date of Birth: 05/29/1940 Referring Provider:   April Manson Pulmonary Rehab Walk Test from 03/15/2016 in [place]  Referring Provider  Dr. Halford Chessman      Encounter Date: 03/24/2016  Check In:     Session Check In - 03/24/16 1023      Check-In   Location [facility]   Staff Present Rosebud Poles, RN, BSN;Molly diVincenzo, MS, ACSM RCEP, Exercise Physiologist;Christianjames Soule Rollene Rotunda, RN, BSN   Supervising physician immediately available to respond to emergencies [facility] immediately available   Physician(s) dR. [email or web address]   Medication changes reported     No   Fall or balance concerns reported    No   Warm-up and Cool-down Performed as group-led instruction   Resistance Training Performed Yes   VAD Patient? No     Pain Assessment   Currently in Pain? No/denies   Multiple Pain Sites No      Capillary Blood Glucose: No results found for this or any previous visit (from the past 24 hour(s)).      Exercise Prescription Changes - 03/24/16 1218      Exercise Review   Progression Yes     Response to Exercise   Blood Pressure (Admit) 114/64   Blood Pressure (Exercise) 136/70   Blood Pressure (Exit) 100/60   Heart Rate (Admit) 65 bpm   Heart Rate (Exercise) 78 bpm   Heart Rate (Exit) 66 bpm   Oxygen Saturation (Admit) 95 %   Oxygen Saturation (Exercise) 94 %   Oxygen Saturation (Exit) 98 %   Rating of Perceived Exertion (Exercise) 13   Perceived Dyspnea (Exercise) 2   Duration Progress to 45 minutes of aerobic exercise without signs/symptoms of physical distress   Intensity THRR unchanged     Progression   Progression Continue progressive overload as per policy without signs/symptoms or physical distress.     Resistance Training   Training Prescription Yes   Weight orange bands   Reps 10-12     NuStep   Level 2   Minutes 17   METs 1.7      Track   Laps 6   Minutes 17     Goals Met:  Exercise tolerated well Queuing for purse lip breathing No report of cardiac concerns or symptoms Strength training completed today  Goals Unmet:  Not Applicable  Comments: Service time is from 1030 to 1200   Dr. Rush Farmer is Medical Director for Pulmonary Rehab at [facility].

## 2016-03-29 ENCOUNTER — Encounter (HOSPITAL_COMMUNITY): Payer: Medicare Other

## 2016-03-29 ENCOUNTER — Encounter (HOSPITAL_COMMUNITY)
Admission: RE | Admit: 2016-03-29 | Discharge: 2016-03-29 | Disposition: A | Discharge status: Home / Self Care | Payer: Medicare Other | Source: Ambulatory Visit | Attending: Pulmonary Disease | Admitting: Pulmonary Disease

## 2016-03-29 VITALS — Wt 127.6 lb

## 2016-03-29 DIAGNOSIS — J439 Emphysema, unspecified: Secondary | ICD-10-CM

## 2016-03-29 DIAGNOSIS — J432 Centrilobular emphysema: Secondary | ICD-10-CM

## 2016-03-29 NOTE — Progress Notes (Signed)
Daily Session Note  Patient Details  Name: Gina Fritz MRN: [id number] Date of Birth: 07-04-1940 Referring Provider:   April Manson Pulmonary Rehab Walk Test from 03/15/2016 in [place]  Referring Provider  Dr. Halford Chessman      Encounter Date: 03/29/2016  Check In:     Session Check In - 03/29/16 1028      Check-In   Location [facility]   Staff Present Rosebud Poles, RN, BSN;Ramon Dredge, RN, MHA;Jeyden Coffelt Rollene Rotunda, RN, BSN   Supervising physician immediately available to respond to emergencies [facility] immediately available   Physician(s) Dr. Ree Kida   Medication changes reported     No   Fall or balance concerns reported    No   Warm-up and Cool-down Performed as group-led instruction   Resistance Training Performed Yes   VAD Patient? No     Pain Assessment   Currently in Pain? No/denies   Multiple Pain Sites No      Capillary Blood Glucose: No results found for this or any previous visit (from the past 24 hour(s)).      Exercise Prescription Changes - 03/29/16 1238      Exercise Review   Progression Yes     Response to Exercise   Blood Pressure (Admit) 124/64   Blood Pressure (Exercise) 118/70   Blood Pressure (Exit) 118/76   Heart Rate (Admit) 64 bpm   Heart Rate (Exercise) 72 bpm   Heart Rate (Exit) 60 bpm   Oxygen Saturation (Admit) 96 %   Oxygen Saturation (Exercise) 95 %   Oxygen Saturation (Exit) 98 %   Rating of Perceived Exertion (Exercise) 17   Perceived Dyspnea (Exercise) 2   Duration Progress to 45 minutes of aerobic exercise without signs/symptoms of physical distress   Intensity THRR unchanged     Progression   Progression Continue progressive overload as per policy without signs/symptoms or physical distress.     Resistance Training   Training Prescription Yes   Weight orange bands   Reps 10-12     NuStep   Level 3   Minutes 17   METs 1.6     Arm Ergometer   Level 1    Minutes 17     Track   Laps 7   Minutes 17     Goals Met:  Using PLB without cueing & demonstrates good technique Exercise tolerated well No report of cardiac concerns or symptoms Strength training completed today  Goals Unmet:  Not Applicable  Comments: Service time is from 1030 to 1200   Dr. Rush Farmer is Medical Director for Pulmonary Rehab at [facility].

## 2016-03-31 ENCOUNTER — Encounter (HOSPITAL_COMMUNITY): Payer: Medicare Other

## 2016-03-31 ENCOUNTER — Encounter (HOSPITAL_COMMUNITY)
Admission: RE | Admit: 2016-03-31 | Discharge: 2016-03-31 | Disposition: A | Discharge status: Home / Self Care | Payer: Medicare Other | Source: Ambulatory Visit | Attending: Pulmonary Disease | Admitting: Pulmonary Disease

## 2016-03-31 VITALS — Wt 127.9 lb

## 2016-03-31 DIAGNOSIS — J439 Emphysema, unspecified: Secondary | ICD-10-CM | POA: Diagnosis not present

## 2016-03-31 NOTE — Progress Notes (Signed)
Daily Session Note  Patient Details  Name: Gina Fritz MRN: [id number] Date of Birth: 06-22-1940 Referring Provider:   April Manson Pulmonary Rehab Walk Test from 03/15/2016 in [place]  Referring Provider  Dr. Halford Chessman      Encounter Date: 03/31/2016  Check In:     Session Check In - 03/31/16 1035      Check-In   Location [facility]   Staff Present Rosebud Poles, RN, Luisa Hart, RN, BSN;Ramon Dredge, RN, MHA;Lisa Ysidro Evert, RN   Supervising physician immediately available to respond to emergencies [facility] immediately available   Physician(s) Dr. Wynetta Emery   Medication changes reported     No   Fall or balance concerns reported    No   Warm-up and Cool-down Performed as group-led instruction   Resistance Training Performed Yes   VAD Patient? No     Pain Assessment   Currently in Pain? No/denies   Multiple Pain Sites No      Capillary Blood Glucose: No results found for this or any previous visit (from the past 24 hour(s)).      Exercise Prescription Changes - 03/31/16 1247      Response to Exercise   Blood Pressure (Admit) 120/66   Blood Pressure (Exercise) 120/70   Blood Pressure (Exit) 112/80   Heart Rate (Admit) 72 bpm   Heart Rate (Exercise) 70 bpm   Heart Rate (Exit) 63 bpm   Oxygen Saturation (Admit) 97 %   Oxygen Saturation (Exercise) 95 %   Oxygen Saturation (Exit) 97 %   Rating of Perceived Exertion (Exercise) 13   Perceived Dyspnea (Exercise) 1   Duration Progress to 45 minutes of aerobic exercise without signs/symptoms of physical distress   Intensity THRR unchanged     Progression   Progression Continue progressive overload as per policy without signs/symptoms or physical distress.     Resistance Training   Training Prescription Yes   Weight orange bands   Reps 10-12     NuStep   Level 3   Minutes 17   METs 1.5     Arm Ergometer   Level 1   Minutes 17      Goals Met:  Independence with exercise equipment Using PLB without cueing & demonstrates good technique Exercise tolerated well No report of cardiac concerns or symptoms Strength training completed today  Goals Unmet:  Not Applicable  Comments: Service time is from 1030 to 1215   Dr. Rush Farmer is Medical Director for Pulmonary Rehab at [facility].

## 2016-03-31 NOTE — Progress Notes (Signed)
Pulmonary Individual Treatment Plan  Patient Details  Name: Gina Fritz MRN: [id number] Date of Birth: 06/28/1940 Referring Provider:   April Manson Pulmonary Rehab Walk Test from 03/15/2016 in [place]  Referring Provider  Dr. Halford Chessman      Initial Encounter Date:  Flowsheet Row Pulmonary Rehab Walk Test from 03/15/2016 in [place]  Date  03/15/16  Referring Provider  Dr. Halford Chessman      Visit Diagnosis: Pulmonary emphysema, unspecified emphysema type ([place])  Patient's Home Medications on Admission:   Current Outpatient Prescriptions:  .  acetaminophen (TYLENOL) 325 MG tablet, Take 2 tablets (650 mg total) by mouth every 6 (six) hours as needed for mild pain (or Fever >/= 101)., Disp: 30 tablet, Rfl: 1 .  albuterol (PROAIR HFA) 108 (90 Base) MCG/ACT inhaler, Inhale 2 puffs into the lungs every 6 (six) hours as needed for wheezing or shortness of breath., Disp: 1 Inhaler, Rfl: 2 .  alendronate (FOSAMAX) 70 MG tablet, Take 70 mg by mouth every Sunday., Disp: , Rfl:  .  amLODipine (NORVASC) 10 MG tablet, Take 1 tablet (10 mg total) by mouth daily. (Patient taking differently: Take 5 mg by mouth daily. ), Disp: 30 tablet, Rfl: 3 .  aspirin EC 81 MG tablet, Take 81 mg by mouth daily., Disp: , Rfl:  .  atorvastatin (LIPITOR) 20 MG tablet, Take 1 tablet (20 mg total) by mouth daily., Disp: 90 tablet, Rfl: 2 .  BESIVANCE 0.6 % SUSP, Place 1 drop into the right eye See admin instructions. Takes the day before and the day of the dr visit 4 times a day, Disp: , Rfl:  .  Cholecalciferol (VITAMIN D) 2000 UNITS CAPS, Take 2,000 Units by mouth daily., Disp: , Rfl:  .  Cyanocobalamin (VITAMIN B-12 PO), Take 1 tablet by mouth daily., Disp: , Rfl:  .  diphenoxylate-atropine (LOMOTIL) 2.5-0.025 MG tablet, Take 1 tablet by mouth 4 (four) times daily as needed for diarrhea or loose stools., Disp: 30 tablet, Rfl: 0 .  DONNATAL 16.2 MG  tablet, Take 16.2 tablets by mouth daily as needed (ibs). , Disp: , Rfl:  .  escitalopram (LEXAPRO) 20 MG tablet, Take 20 mg by mouth daily. , Disp: , Rfl:  .  fluticasone (FLONASE) 50 MCG/ACT nasal spray, Place 2 sprays into the nose daily., Disp: , Rfl:  .  gabapentin (NEURONTIN) 300 MG capsule, Take 1 capsule (300 mg total) by mouth 3 (three) times daily., Disp: 90 capsule, Rfl: 1 .  influenza vac recom quadrivalent (FLUBLOK) 0.5 ML injection, Inject 0.5 mLs into the muscle once., Disp: , Rfl:  .  losartan (COZAAR) 100 MG tablet, Take 1 tablet (100 mg total) by mouth daily. (Patient taking differently: Take 50 mg by mouth 2 (two) times daily. ), Disp: 30 tablet, Rfl: 1 .  mometasone-formoterol (DULERA) 100-5 MCG/ACT AERO, Inhale 2 puffs into the lungs 2 (two) times daily., Disp: 1 Inhaler, Rfl: 5 .  montelukast (SINGULAIR) 10 MG tablet, Take 1 tablet (10 mg total) by mouth daily., Disp: 90 tablet, Rfl: 1 .  Multiple Vitamin (MULTIVITAMIN WITH MINERALS) TABS, Take 1 tablet by mouth daily., Disp: , Rfl:  .  Multiple Vitamins-Minerals (ICAPS AREDS 2 PO), Take 1 tablet by mouth 2 (two) times daily., Disp: , Rfl:  .  Niacin (VITAMIN B-3 PO), Take 1 tablet by mouth daily., Disp: , Rfl:  .  omeprazole (PRILOSEC) 20 MG capsule, Take 1 capsule (20 mg total)  by mouth daily., Disp: 30 capsule, Rfl: 1 .  ondansetron (ZOFRAN-ODT) 4 MG disintegrating tablet, Take 1 tablet (4 mg total) by mouth every 8 (eight) hours as needed for nausea or vomiting., Disp: 12 tablet, Rfl: 0 .  pantoprazole (PROTONIX) 40 MG tablet, Take 1 tablet (40 mg total) by mouth daily. (Patient not taking: Reported on 03/14/2016), Disp: 30 tablet, Rfl: 1 .  polyethylene glycol (MIRALAX / GLYCOLAX) packet, Take 17 g by mouth daily as needed for mild constipation., Disp: , Rfl:  .  promethazine (PHENERGAN) 25 MG tablet, Take 1 tablet (25 mg total) by mouth every 6 (six) hours as needed for nausea or vomiting., Disp: 10 tablet, Rfl: 0 .   Riboflavin (VITAMIN B-2 PO), Take 1 tablet by mouth daily., Disp: , Rfl:  .  solifenacin (VESICARE) 10 MG tablet, Take 10 mg by mouth daily. , Disp: , Rfl:  .  tamsulosin (FLOMAX) 0.4 MG CAPS capsule, Take 1 capsule (0.4 mg total) by mouth daily after breakfast. (Patient not taking: Reported on 03/14/2016), Disp: 30 capsule, Rfl: 2  Past Medical History: Past Medical History:  Diagnosis Date  . Allergic rhinitis   . Anxiety   . CAP (community acquired pneumonia)    dx 02-05-2015  . Colitis    dx 02-05-2015  . COPD with emphysema ([place])   . Depression   . Dizziness   . GERD (gastroesophageal reflux disease)   . History of chronic sinusitis   . History of kidney stones   . Hyperlipidemia   . Hypertension   . Irritable bowel syndrome   . Loss of weight   . Pulmonary nodules    benign and stable per CT  . Rash   . Right ureteral stone   . Sensorineural hearing loss, unilateral   . Subjective tinnitus     Tobacco Use: History  Smoking Status  . Former Smoker  . Packs/day: 1.00  . Years: 25.00  . Types: Cigarettes  . Quit date: 02/05/2015  Smokeless Tobacco  . Never Used    Labs: Recent Review Flowsheet Data    Labs for ITP Cardiac and Pulmonary Rehab Latest Ref Rng & Units 11/17/2015 02/24/2016   Cholestrol 0 - 200 mg/dL - 200   LDLCALC 0 - 99 mg/dL - 121(H)   HDL >39.00 mg/dL - 46.10   Trlycerides 0.0 - 149.0 mg/dL - 162.0(H)   Hemoglobin A1c 4.8 - 5.6 % 4.8 -      Capillary Blood Glucose: Lab Results  Component Value Date   GLUCAP 112 (H) 02/09/2015     ADL UCSD:   Pulmonary Function Assessment:     Pulmonary Function Assessment - 03/14/16 1250      Breath   Bilateral Breath Sounds Clear   Shortness of Breath Yes;Limiting activity      Exercise Target Goals:    Exercise Program Goal: Individual exercise prescription set with THRR, safety & activity barriers. Participant demonstrates ability to understand and report RPE using BORG scale, to  self-measure pulse accurately, and to acknowledge the importance of the exercise prescription.  Exercise Prescription Goal: Starting with aerobic activity 30 plus minutes a day, 3 days per week for initial exercise prescription. Provide home exercise prescription and guidelines that participant acknowledges understanding prior to discharge.  Activity Barriers & Risk Stratification:     Activity Barriers & Cardiac Risk Stratification - 03/14/16 1248      Activity Barriers & Cardiac Risk Stratification   Activity Barriers Balance Concerns;Deconditioning  6 Minute Walk:     6 Minute Walk    Row Name 03/15/16 1550         6 Minute Walk   Phase Initial     Distance 810 feet     Walk Time 6 minutes     # of Rest Breaks 0     MPH 1.53     METS 2.15     RPE 12     Perceived Dyspnea  1     Symptoms No     Resting HR 62 bpm     Resting BP 130/72     Max Ex. HR 101 bpm     Max Ex. BP 130/80       Interval HR   Baseline HR 62     1 Minute HR 101     2 Minute HR 70     3 Minute HR 70     4 Minute HR 70     5 Minute HR 96     6 Minute HR 76     2 Minute Post HR 71     Interval Heart Rate? Yes       Interval Oxygen   Interval Oxygen? Yes     Baseline Oxygen Saturation % 95 %     Baseline Liters of Oxygen 0 L     1 Minute Oxygen Saturation % 92 %     1 Minute Liters of Oxygen 0 L     2 Minute Oxygen Saturation % 94 %     2 Minute Liters of Oxygen 0 L     3 Minute Oxygen Saturation % 94 %     3 Minute Liters of Oxygen 0 L     4 Minute Oxygen Saturation % 94 %     4 Minute Liters of Oxygen 0 L     5 Minute Oxygen Saturation % 96 %     5 Minute Liters of Oxygen 0 L     6 Minute Oxygen Saturation % 95 %     6 Minute Liters of Oxygen 0 L     2 Minute Post Oxygen Saturation % 95 %     2 Minute Post Liters of Oxygen 0 L        Initial Exercise Prescription:     Initial Exercise Prescription - 03/15/16 1500      Date of Initial Exercise RX and Referring Provider    Date 03/15/16   Referring Provider Dr. Halford Chessman     NuStep   Level 2   Minutes 17   METs 1.5     Arm Ergometer   Level 1   Minutes 17   METs 1.5     Track   Laps 5   Minutes 17     Prescription Details   Frequency (times per week) 2   Duration Progress to 45 minutes of aerobic exercise without signs/symptoms of physical distress     Intensity   THRR 40-80% of Max Heartrate 58-117   Ratings of Perceived Exertion 11-13   Perceived Dyspnea 0-4     Progression   Progression Continue progressive overload as per policy without signs/symptoms or physical distress.     Resistance Training   Training Prescription Yes   Weight orange bands   Reps 10-12      Perform Capillary Blood Glucose checks as needed.  Exercise Prescription Changes:     Exercise Prescription Changes    Row Name 03/22/16  1200 03/24/16 1218 03/29/16 1238         Exercise Review   Progression  - Yes Yes       Response to Exercise   Blood Pressure (Admit) 120/68 114/64 124/64     Blood Pressure (Exercise) 130/60 136/70 118/70     Blood Pressure (Exit) 118/70 100/60 118/76     Heart Rate (Admit) 63 bpm 65 bpm 64 bpm     Heart Rate (Exercise) 69 bpm 78 bpm 72 bpm     Heart Rate (Exit) 59 bpm 66 bpm 60 bpm     Oxygen Saturation (Admit) 98 % 95 % 96 %     Oxygen Saturation (Exercise) 95 % 94 % 95 %     Oxygen Saturation (Exit) 97 % 98 % 98 %     Rating of Perceived Exertion (Exercise) 15 13 17      Perceived Dyspnea (Exercise) 2 2 2      Duration Progress to 45 minutes of aerobic exercise without signs/symptoms of physical distress Progress to 45 minutes of aerobic exercise without signs/symptoms of physical distress Progress to 45 minutes of aerobic exercise without signs/symptoms of physical distress     Intensity THRR unchanged THRR unchanged THRR unchanged       Progression   Progression Continue progressive overload as per policy without signs/symptoms or physical distress. Continue progressive  overload as per policy without signs/symptoms or physical distress. Continue progressive overload as per policy without signs/symptoms or physical distress.       Resistance Training   Training Prescription Yes Yes Yes     Weight orange bands orange bands orange bands     Reps 10-12 10-12 10-12       NuStep   Level 1 2 3      Minutes 17 17 17      METs 1.4 1.7 1.6       Arm Ergometer   Level 1  - 1     Minutes 17  - 17       Track   Laps 9 6 7      Minutes 17 17 17         Exercise Comments:     Exercise Comments    Row Name 03/22/16 [phone]           Exercise Comments Patient will attend first day of exercise today. Will monitor.           Discharge Exercise Prescription (Final Exercise Prescription Changes):     Exercise Prescription Changes - 03/29/16 1238      Exercise Review   Progression Yes     Response to Exercise   Blood Pressure (Admit) 124/64   Blood Pressure (Exercise) 118/70   Blood Pressure (Exit) 118/76   Heart Rate (Admit) 64 bpm   Heart Rate (Exercise) 72 bpm   Heart Rate (Exit) 60 bpm   Oxygen Saturation (Admit) 96 %   Oxygen Saturation (Exercise) 95 %   Oxygen Saturation (Exit) 98 %   Rating of Perceived Exertion (Exercise) 17   Perceived Dyspnea (Exercise) 2   Duration Progress to 45 minutes of aerobic exercise without signs/symptoms of physical distress   Intensity THRR unchanged     Progression   Progression Continue progressive overload as per policy without signs/symptoms or physical distress.     Resistance Training   Training Prescription Yes   Weight orange bands   Reps 10-12     NuStep   Level 3   Minutes 17  METs 1.6     Arm Ergometer   Level 1   Minutes 17     Track   Laps 7   Minutes 17       Nutrition:  Target Goals: Understanding of nutrition guidelines, daily intake of sodium 1500mg , cholesterol 200mg , calories 30% from fat and 7% or less from saturated fats, daily to have 5 or more servings of fruits and  vegetables.  Biometrics:     Pre Biometrics - 03/14/16 1252      Pre Biometrics   Grip Strength 22 kg       Nutrition Therapy Plan and Nutrition Goals:   Nutrition Discharge: Rate Your Plate Scores:   Psychosocial: Target Goals: Acknowledge presence or absence of depression, maximize coping skills, provide positive support system. Participant is able to verbalize types and ability to use techniques and skills needed for reducing stress and depression.  Initial Review & Psychosocial Screening:     Initial Psych Review & Screening - 03/14/16 1257      Initial Review   Current issues with --  none identified     Family Dynamics   Good Support System? Yes     Barriers   Psychosocial barriers to participate in program There are no identifiable barriers or psychosocial needs.     Screening Interventions   Interventions Encouraged to exercise      Quality of Life Scores:   PHQ-9: Recent Review Flowsheet Data    Depression screen [facility] 2/9 03/14/2016 10/27/2015 07/06/2015   Decreased Interest 0 0 0   Down, Depressed, Hopeless 1 0 1   PHQ - 2 Score 1 0 1      Psychosocial Evaluation and Intervention:     Psychosocial Evaluation - 03/29/16 0815      Psychosocial Evaluation & Interventions   Interventions Encouraged to exercise with the program and follow exercise prescription      Psychosocial Re-Evaluation:     Psychosocial Re-Evaluation    [place] Name 03/29/16 0816             Psychosocial Re-Evaluation   Interventions Encouraged to attend Pulmonary Rehabilitation for the exercise       Comments No psychosocial concerns identified at this time.         Education: Education Goals: Education classes will be provided on a weekly basis, covering required topics. Participant will state understanding/return demonstration of topics presented.  Learning Barriers/Preferences:     Learning Barriers/Preferences - 03/14/16 1248      Learning  Barriers/Preferences   Learning Barriers None   Learning Preferences Written Material;Verbal Instruction;Video;Skilled Demonstration;Pictoral;Individual Instruction;Group Instruction;Audio      Education Topics: Risk Factor Reduction:  -Group instruction that is supported by a PowerPoint presentation. Instructor discusses the definition of a risk factor, different risk factors for pulmonary disease, and how the heart and lungs work together.     Nutrition for Pulmonary Patient:  -Group instruction provided by PowerPoint slides, verbal discussion, and written materials to support subject matter. The instructor gives an explanation and review of healthy diet recommendations, which includes a discussion on weight management, recommendations for fruit and vegetable consumption, as well as protein, fluid, caffeine, fiber, sodium, sugar, and alcohol. Tips for eating when patients are short of breath are discussed. Flowsheet Row PULMONARY REHAB OTHER RESPIRATORY from 10/15/2015 in [place]  Date  09/03/15  Educator  RD  Instruction Review Code  2- meets goals/outcomes      Pursed Lip Breathing:  -  Group instruction that is supported by demonstration and informational handouts. Instructor discusses the benefits of pursed lip and diaphragmatic breathing and detailed demonstration on how to preform both.   Flowsheet Row PULMONARY REHAB OTHER RESPIRATORY from 03/24/2016 in [place]  Date  03/24/16  Educator  EP  Instruction Review Code  2- meets goals/outcomes      Oxygen Safety:  -Group instruction provided by PowerPoint, verbal discussion, and written material to support subject matter. There is an overview of "What is Oxygen" and "Why do we need it".  Instructor also reviews how to create a safe environment for oxygen use, the importance of using oxygen as prescribed, and the risks of noncompliance. There is a brief discussion on  traveling with oxygen and resources the patient may utilize. Flowsheet Row PULMONARY REHAB OTHER RESPIRATORY from 10/15/2015 in [place]  Date  09/17/15  Educator  RN  Instruction Review Code  2- meets goals/outcomes      Oxygen Equipment:  -Group instruction provided by [facility] Staff utilizing handouts, written materials, and equipment demonstrations. Flowsheet Row PULMONARY REHAB OTHER RESPIRATORY from 10/15/2015 in [place]  Date  07/23/15  Educator  Ace Gins Rep  Instruction Review Code  2- meets goals/outcomes      Signs and Symptoms:  -Group instruction provided by written material and verbal discussion to support subject matter. Warning signs and symptoms of infection, stroke, and heart attack are reviewed and when to call the physician/911 reinforced. Tips for preventing the spread of infection discussed. Flowsheet Row PULMONARY REHAB OTHER RESPIRATORY from 10/15/2015 in [place]  Date  10/15/15  Educator  RN  Instruction Review Code  2- meets goals/outcomes      Advanced Directives:  -Group instruction provided by verbal instruction and written material to support subject matter. Instructor reviews Advanced Directive laws and proper instruction for filling out document. Flowsheet Row PULMONARY REHAB OTHER RESPIRATORY from 10/15/2015 in [place]  Date  08/20/15  Educator  Jeanella Craze  Instruction Review Code  2- meets goals/outcomes      Pulmonary Video:  -Group video education that reviews the importance of medication and oxygen compliance, exercise, good nutrition, pulmonary hygiene, and pursed lip and diaphragmatic breathing for the pulmonary patient. Flowsheet Row PULMONARY REHAB OTHER RESPIRATORY from 10/15/2015 in [place]  Date  09/10/15  Instruction Review Code  2- meets goals/outcomes       Exercise for the Pulmonary Patient:  -Group instruction that is supported by a PowerPoint presentation. Instructor discusses benefits of exercise, core components of exercise, frequency, duration, and intensity of an exercise routine, importance of utilizing pulse oximetry during exercise, safety while exercising, and options of places to exercise outside of rehab.   Flowsheet Row PULMONARY REHAB OTHER RESPIRATORY from 10/15/2015 in [place]  Date  10/08/15  Educator  EP  Instruction Review Code  2- meets goals/outcomes      Pulmonary Medications:  -Verbally interactive group education provided by instructor with focus on inhaled medications and proper administration. Flowsheet Row PULMONARY REHAB OTHER RESPIRATORY from 10/15/2015 in [place]  Date  07/16/15  Educator  RT  Instruction Review Code  2- meets goals/outcomes      Anatomy and Physiology of the Respiratory System and Intimacy:  -Group instruction provided by PowerPoint, verbal discussion, and written material to  support subject matter. Instructor reviews respiratory cycle and anatomical components of the respiratory system and their functions. Instructor also reviews differences in obstructive and restrictive respiratory diseases with examples of each. Intimacy, Sex, and Sexuality differences are reviewed with a discussion on how relationships can change when diagnosed with pulmonary disease. Common sexual concerns are reviewed. Flowsheet Row PULMONARY REHAB OTHER RESPIRATORY from 10/15/2015 in [place]  Date  08/13/15  Educator  RN  Instruction Review Code  2- meets goals/outcomes      Knowledge Questionnaire Score:   Core Components/Risk Factors/Patient Goals at Admission:     Personal Goals and Risk Factors at Admission - 03/14/16 1252      Core Components/Risk Factors/Patient Goals on Admission   Increase  Strength and Stamina Yes   Improve shortness of breath with ADL's Yes   Develop more efficient breathing techniques such as purse lipped breathing and diaphragmatic breathing; and practicing self-pacing with activity Yes      Core Components/Risk Factors/Patient Goals Review:      Goals and Risk Factor Review    Row Name 03/29/16 0814 03/29/16 0823           Core Components/Risk Factors/Patient Goals Review   Personal Goals Review Increase Strength and Stamina;Improve shortness of breath with ADL's;Develop more efficient breathing techniques such as purse lipped breathing and diaphragmatic breathing and practicing self-pacing with activity.  -      Review Patient has only attended 2 session. see comments sections on ITP      Expected Outcomes See admission outcomes  -         Core Components/Risk Factors/Patient Goals at Discharge (Final Review):      Goals and Risk Factor Review - 03/29/16 0823      Core Components/Risk Factors/Patient Goals Review   Review see comments sections on ITP      ITP Comments:   Comments: ITP REVIEW Pt is making expected progress toward pulmonary rehab goals after completing 3 sessions. This is Derrica's second time through the program. Her primary goals this admission is to increase her stamina and strength. She has retained all education from her last admission however the classes will still be a nice refresher and will reinforce how she can better care for herself.  Recommend continued exercise, life style modification, education, and utilization of breathing techniques to increase stamina and strength and decrease shortness of breath with exertion.

## 2016-04-05 ENCOUNTER — Encounter (HOSPITAL_COMMUNITY): Payer: Medicare Other

## 2016-04-05 ENCOUNTER — Encounter (HOSPITAL_COMMUNITY)
Admission: RE | Admit: 2016-04-05 | Discharge: 2016-04-05 | Disposition: A | Discharge status: Home / Self Care | Payer: Medicare Other | Source: Ambulatory Visit | Attending: Pulmonary Disease | Admitting: Pulmonary Disease

## 2016-04-05 ENCOUNTER — Telehealth: Payer: Self-pay | Admitting: Family Medicine

## 2016-04-05 VITALS — Wt 128.7 lb

## 2016-04-05 DIAGNOSIS — J432 Centrilobular emphysema: Secondary | ICD-10-CM

## 2016-04-05 DIAGNOSIS — J439 Emphysema, unspecified: Secondary | ICD-10-CM | POA: Diagnosis present

## 2016-04-05 NOTE — Telephone Encounter (Signed)
Pt lost the handicap renewal form and needs another completed

## 2016-04-05 NOTE — Progress Notes (Signed)
Daily Session Note  Patient Details  Name: Gina Fritz MRN: [id number] Date of Birth: 19-Sep-1940 Referring Provider:   April Manson Pulmonary Rehab Walk Test from 03/15/2016 in [place]  Referring Provider  Dr. Halford Chessman      Encounter Date: 04/05/2016  Check In:     Session Check In - 04/05/16 1010      Check-In   Location [facility]   Staff Present Rosebud Poles, RN, BSN;Wauneta Silveria, MS, ACSM RCEP, Exercise Physiologist;Lisa Ysidro Evert, RN   Supervising physician immediately available to respond to emergencies [facility] immediately available   Physician(s) Dr. Quincy Simmonds   Medication changes reported     No   Fall or balance concerns reported    No   Warm-up and Cool-down Performed as group-led instruction   Resistance Training Performed Yes   VAD Patient? No     Pain Assessment   Currently in Pain? No/denies   Multiple Pain Sites No      Capillary Blood Glucose: No results found for this or any previous visit (from the past 24 hour(s)).      Exercise Prescription Changes - 04/05/16 1200      Exercise Review   Progression Yes     Response to Exercise   Blood Pressure (Admit) 122/70   Blood Pressure (Exercise) 144/70   Blood Pressure (Exit) 100/70   Heart Rate (Admit) 62 bpm   Heart Rate (Exercise) 71 bpm   Heart Rate (Exit) 61 bpm   Oxygen Saturation (Admit) 97 %   Oxygen Saturation (Exercise) 94 %   Oxygen Saturation (Exit) 96 %   Rating of Perceived Exertion (Exercise) 14   Perceived Dyspnea (Exercise) 2   Duration Progress to 45 minutes of aerobic exercise without signs/symptoms of physical distress   Intensity THRR unchanged     Progression   Progression Continue progressive overload as per policy without signs/symptoms or physical distress.     Resistance Training   Training Prescription Yes   Weight orange bands   Reps 10-12     NuStep   Level 4   Minutes 17   METs 1.7     Arm  Ergometer   Level 1   Minutes 17     Track   Laps 8   Minutes 17     Goals Met:  Exercise tolerated well No report of cardiac concerns or symptoms Strength training completed today  Goals Unmet:  Not Applicable  Comments: Service time is from 10:30am to 11:55am    Dr. Rush Farmer is Medical Director for Pulmonary Rehab at [facility].

## 2016-04-06 ENCOUNTER — Other Ambulatory Visit: Payer: Self-pay | Admitting: Family Medicine

## 2016-04-06 NOTE — Telephone Encounter (Signed)
Called and let Gina Fritz's husband know that handicap placard is up front and ready for pick up. He verbalized understanding.

## 2016-04-06 NOTE — Telephone Encounter (Signed)
Form filled out & placed onto MD's desk for signature.

## 2016-04-07 ENCOUNTER — Encounter (HOSPITAL_COMMUNITY)
Admission: RE | Admit: 2016-04-07 | Discharge: 2016-04-07 | Disposition: A | Discharge status: Home / Self Care | Payer: Medicare Other | Source: Ambulatory Visit | Attending: Pulmonary Disease | Admitting: Pulmonary Disease

## 2016-04-07 ENCOUNTER — Encounter (HOSPITAL_COMMUNITY): Payer: Medicare Other

## 2016-04-07 VITALS — Wt 129.9 lb

## 2016-04-07 DIAGNOSIS — J439 Emphysema, unspecified: Secondary | ICD-10-CM

## 2016-04-07 NOTE — Progress Notes (Signed)
Daily Session Note  Patient Details  Name: Gina Fritz MRN: [id number] Date of Birth: 1940/04/06 Referring Provider:   April Manson Pulmonary Rehab Walk Test from 03/15/2016 in [place]  Referring Provider  Dr. Halford Chessman      Encounter Date: 04/07/2016  Check In:     Session Check In - 04/07/16 1052      Check-In   Location [facility]      Capillary Blood Glucose: No results found for this or any previous visit (from the past 24 hour(s)).      Exercise Prescription Changes - 04/07/16 1200      Response to Exercise   Blood Pressure (Admit) 120/60   Blood Pressure (Exercise) 124/60   Blood Pressure (Exit) 106/60   Heart Rate (Admit) 65 bpm   Heart Rate (Exercise) 66 bpm   Heart Rate (Exit) 63 bpm   Oxygen Saturation (Admit) 95 %   Oxygen Saturation (Exercise) 96 %   Oxygen Saturation (Exit) 98 %   Rating of Perceived Exertion (Exercise) 13   Perceived Dyspnea (Exercise) 2   Duration Progress to 45 minutes of aerobic exercise without signs/symptoms of physical distress   Intensity THRR unchanged     Progression   Progression Continue progressive overload as per policy without signs/symptoms or physical distress.     Resistance Training   Training Prescription Yes   Weight orange bands   Reps 10-12  10 minutes of strength training     Interval Training   Interval Training No     NuStep   Level 4   Minutes 17   METs 1.9     Arm Ergometer   Level 1   Minutes 17     Goals Met:  Exercise tolerated well Strength training completed today  Goals Unmet:  Not Applicable  Comments: Service time is from 1030 to 1230    Dr. Rush Farmer is Medical Director for Pulmonary Rehab at [facility].

## 2016-04-12 ENCOUNTER — Encounter (HOSPITAL_COMMUNITY): Payer: Medicare Other

## 2016-04-12 ENCOUNTER — Encounter (HOSPITAL_COMMUNITY)
Admission: RE | Admit: 2016-04-12 | Discharge: 2016-04-12 | Disposition: A | Discharge status: Home / Self Care | Payer: Medicare Other | Source: Ambulatory Visit | Attending: Pulmonary Disease | Admitting: Pulmonary Disease

## 2016-04-12 VITALS — Wt 129.9 lb

## 2016-04-12 DIAGNOSIS — J432 Centrilobular emphysema: Secondary | ICD-10-CM

## 2016-04-12 DIAGNOSIS — J439 Emphysema, unspecified: Secondary | ICD-10-CM

## 2016-04-12 NOTE — Progress Notes (Signed)
Daily Session Note  Patient Details  Name: Keyly Baldonado MRN: [id number] Date of Birth: 1940-05-06 Referring Provider:   April Manson Pulmonary Rehab Walk Test from 03/15/2016 in [place]  Referring Provider  Dr. Halford Chessman      Encounter Date: 04/12/2016  Check In:   Capillary Blood Glucose: No results found for this or any previous visit (from the past 24 hour(s)).      Exercise Prescription Changes - 04/12/16 1200      Response to Exercise   Blood Pressure (Admit) 130/68   Blood Pressure (Exercise) 120/64   Blood Pressure (Exit) 102/64   Heart Rate (Admit) 76 bpm   Heart Rate (Exercise) 87 bpm   Heart Rate (Exit) 73 bpm   Oxygen Saturation (Admit) 94 %   Oxygen Saturation (Exercise) 89 %   Oxygen Saturation (Exit) 95 %   Rating of Perceived Exertion (Exercise) 15   Perceived Dyspnea (Exercise) 2   Duration Progress to 45 minutes of aerobic exercise without signs/symptoms of physical distress   Intensity THRR unchanged     Progression   Progression Continue progressive overload as per policy without signs/symptoms or physical distress.     Resistance Training   Training Prescription Yes   Weight orange bands   Reps 10-12  10 minutes of strength training     Interval Training   Interval Training No     NuStep   Level 4   Minutes 17   METs 2     Arm Ergometer   Level 1   Minutes 17     Track   Laps 6   Minutes 17     Goals Met:  Exercise tolerated well No report of cardiac concerns or symptoms Strength training completed today  Goals Unmet:  Not Applicable  Comments: Service time is from 10:30a to 12:00p    Dr. Rush Farmer is Medical Director for Pulmonary Rehab at [facility].

## 2016-04-14 ENCOUNTER — Encounter (HOSPITAL_COMMUNITY): Payer: Medicare Other

## 2016-04-14 ENCOUNTER — Encounter (HOSPITAL_COMMUNITY)
Admission: RE | Admit: 2016-04-14 | Discharge: 2016-04-14 | Disposition: A | Discharge status: Home / Self Care | Payer: Medicare Other | Source: Ambulatory Visit | Attending: Pulmonary Disease | Admitting: Pulmonary Disease

## 2016-04-14 VITALS — Wt 128.3 lb

## 2016-04-14 DIAGNOSIS — J439 Emphysema, unspecified: Secondary | ICD-10-CM

## 2016-04-14 DIAGNOSIS — J432 Centrilobular emphysema: Secondary | ICD-10-CM

## 2016-04-14 NOTE — Progress Notes (Signed)
Daily Session Note  Patient Details  Name: Gina Fritz MRN: [id number] Date of Birth: [birth year] Referring Provider:   April Manson Pulmonary Rehab Walk Test from 03/15/2016 in [place]  Referring Provider  Dr. Halford Chessman      Encounter Date: 04/14/2016  Check In:     Session Check In - 04/14/16 1030      Check-In   Location [facility]   Staff Present Rosebud Poles, RN, BSN;Molly diVincenzo, MS, ACSM RCEP, Exercise Physiologist;Netra Postlethwait Ysidro Evert, RN;Portia Rollene Rotunda, RN, BSN   Supervising physician immediately available to respond to emergencies [facility] immediately available   Physician(s) Dr. Eliseo Squires   Medication changes reported     No   Fall or balance concerns reported    No   Warm-up and Cool-down Performed as group-led instruction   Resistance Training Performed Yes   VAD Patient? No     Pain Assessment   Currently in Pain? No/denies   Multiple Pain Sites No      Capillary Blood Glucose: No results found for this or any previous visit (from the past 24 hour(s)).      Exercise Prescription Changes - 04/14/16 1200      Response to Exercise   Blood Pressure (Admit) 130/76   Blood Pressure (Exercise) 140/80   Blood Pressure (Exit) 112/70   Heart Rate (Admit) 70 bpm   Heart Rate (Exercise) 73 bpm   Heart Rate (Exit) 71 bpm   Oxygen Saturation (Admit) 93 %   Oxygen Saturation (Exercise) 95 %   Oxygen Saturation (Exit) 93 %   Rating of Perceived Exertion (Exercise) 13   Perceived Dyspnea (Exercise) 2   Duration Progress to 45 minutes of aerobic exercise without signs/symptoms of physical distress   Intensity THRR unchanged     Progression   Progression Continue to progress workloads to maintain intensity without signs/symptoms of physical distress.     Resistance Training   Training Prescription Yes   Weight orange bands   Reps 10-12  10 minutes of strength training     Interval Training   Interval  Training No     NuStep   Level 4   Minutes 17   METs 2.6     Track   Laps 6   Minutes 17     Goals Met:  Exercise tolerated well No report of cardiac concerns or symptoms Strength training completed today  Goals Unmet:  Not Applicable  Comments: Service time is from 1030 to 1230    Dr. Rush Farmer is Medical Director for Pulmonary Rehab at [facility]

## 2016-04-19 ENCOUNTER — Encounter (HOSPITAL_COMMUNITY)
Admission: RE | Admit: 2016-04-19 | Discharge: 2016-04-19 | Disposition: A | Discharge status: Home / Self Care | Payer: Medicare Other | Source: Ambulatory Visit | Attending: Pulmonary Disease | Admitting: Pulmonary Disease

## 2016-04-19 ENCOUNTER — Encounter (HOSPITAL_COMMUNITY): Payer: Medicare Other

## 2016-04-19 VITALS — Wt 129.2 lb

## 2016-04-19 DIAGNOSIS — J432 Centrilobular emphysema: Secondary | ICD-10-CM

## 2016-04-19 DIAGNOSIS — J439 Emphysema, unspecified: Secondary | ICD-10-CM | POA: Diagnosis not present

## 2016-04-19 NOTE — Progress Notes (Signed)
Daily Session Note  Patient Details  Name: Donielle Kaigler MRN: [id number] Date of Birth: February 16, 1941 Referring Provider:   April Manson Pulmonary Rehab Walk Test from 03/15/2016 in [place]  Referring Provider  Dr. Halford Chessman      Encounter Date: 04/19/2016  Check In:     Session Check In - 04/19/16 1025      Check-In   Location [facility]   Staff Present Rosebud Poles, RN, BSN;Jamaree Hosier, MS, ACSM RCEP, Exercise Physiologist;Lisa Ysidro Evert, RN;Portia Rollene Rotunda, RN, BSN   Supervising physician immediately available to respond to emergencies [facility] immediately available   Physician(s) Dr. Eliseo Squires   Medication changes reported     No   Fall or balance concerns reported    No   Warm-up and Cool-down Performed as group-led instruction   Resistance Training Performed Yes   VAD Patient? No     Pain Assessment   Currently in Pain? No/denies   Multiple Pain Sites No      Capillary Blood Glucose: No results found for this or any previous visit (from the past 24 hour(s)).      Exercise Prescription Changes - 04/19/16 1200      Exercise Review   Progression Yes     Response to Exercise   Blood Pressure (Admit) 112/70   Blood Pressure (Exercise) 130/74   Blood Pressure (Exit) 110/70   Heart Rate (Admit) 64 bpm   Heart Rate (Exercise) 97 bpm   Heart Rate (Exit) 74 bpm   Oxygen Saturation (Admit) 97 %   Oxygen Saturation (Exercise) 95 %   Oxygen Saturation (Exit) 96 %   Rating of Perceived Exertion (Exercise) 15   Perceived Dyspnea (Exercise) 2   Duration Progress to 45 minutes of aerobic exercise without signs/symptoms of physical distress   Intensity THRR unchanged     Progression   Progression Continue to progress workloads to maintain intensity without signs/symptoms of physical distress.     Resistance Training   Training Prescription Yes   Weight orange bands   Reps 10-12  10 minutes of strength training      Interval Training   Interval Training No     NuStep   Level 4   Minutes 17   METs 2     Arm Ergometer   Level 1.5   Minutes 17     Track   Laps 7   Minutes 17     Goals Met:  Exercise tolerated well No report of cardiac concerns or symptoms Strength training completed today  Goals Unmet:  Not Applicable  Comments: Service time is from 10:30am to 12:10p    Dr. Rush Farmer is Medical Director for Pulmonary Rehab at [facility].

## 2016-04-21 ENCOUNTER — Encounter (HOSPITAL_COMMUNITY): Admission: RE | Admit: 2016-04-21 | Payer: Medicare Other | Source: Ambulatory Visit

## 2016-04-21 ENCOUNTER — Encounter (HOSPITAL_COMMUNITY): Payer: Medicare Other

## 2016-04-26 ENCOUNTER — Encounter (HOSPITAL_COMMUNITY): Payer: Medicare Other

## 2016-04-26 ENCOUNTER — Encounter (HOSPITAL_COMMUNITY)
Admission: RE | Admit: 2016-04-26 | Discharge: 2016-04-26 | Disposition: A | Discharge status: Home / Self Care | Payer: Medicare Other | Source: Ambulatory Visit | Attending: Pulmonary Disease | Admitting: Pulmonary Disease

## 2016-04-26 VITALS — Wt 128.1 lb

## 2016-04-26 DIAGNOSIS — J432 Centrilobular emphysema: Secondary | ICD-10-CM

## 2016-04-26 DIAGNOSIS — J439 Emphysema, unspecified: Secondary | ICD-10-CM

## 2016-04-26 NOTE — Progress Notes (Signed)
Daily Session Note  Patient Details  Name: Gina Fritz MRN: [id number] Date of Birth: September 16, 1940 Referring Provider:   April Manson Pulmonary Rehab Walk Test from 03/15/2016 in [place]  Referring Provider  Dr. Halford Chessman      Encounter Date: 04/26/2016  Check In:     Session Check In - 04/26/16 1030      Check-In   Location [facility]   Staff Present Rosebud Poles, RN, BSN;Molly diVincenzo, MS, ACSM RCEP, Exercise Physiologist;Lisa Ysidro Evert, RN;Moria Brophy Rollene Rotunda, RN, BSN   Supervising physician immediately available to respond to emergencies [facility] immediately available   Physician(s) Dr. Wyline Copas   Medication changes reported     No   Fall or balance concerns reported    No   Warm-up and Cool-down Performed as group-led instruction   Resistance Training Performed Yes   VAD Patient? No     Pain Assessment   Currently in Pain? No/denies   Multiple Pain Sites No      Capillary Blood Glucose: No results found for this or any previous visit (from the past 24 hour(s)).      Exercise Prescription Changes - 04/26/16 1210      Exercise Review   Progression Yes     Response to Exercise   Blood Pressure (Admit) 114/58   Blood Pressure (Exercise) 112/66   Blood Pressure (Exit) 112/68   Heart Rate (Admit) 71 bpm   Heart Rate (Exercise) 84 bpm   Heart Rate (Exit) 70 bpm   Oxygen Saturation (Admit) 97 %   Oxygen Saturation (Exercise) 96 %   Oxygen Saturation (Exit) 97 %   Rating of Perceived Exertion (Exercise) 15   Perceived Dyspnea (Exercise) 2   Duration Progress to 45 minutes of aerobic exercise without signs/symptoms of physical distress   Intensity THRR unchanged     Progression   Progression Continue to progress workloads to maintain intensity without signs/symptoms of physical distress.     Resistance Training   Training Prescription Yes   Weight orange bands   Reps 10-12  10 minutes of strength training      Interval Training   Interval Training No     NuStep   Level 4   Minutes 17   METs 1.8     Arm Ergometer   Level 1.5   Minutes 17     Track   Laps 7   Minutes 17     Goals Met:  Improved SOB with ADL's Using PLB without cueing & demonstrates good technique Exercise tolerated well No report of cardiac concerns or symptoms Strength training completed today  Goals Unmet:  Not Applicable  Comments: Service time is from 1030 to 1200   Dr. Rush Farmer is Medical Director for Pulmonary Rehab at [facility].

## 2016-04-26 NOTE — Progress Notes (Signed)
Pulmonary Individual Treatment Plan  Patient Details  Name: Gina Fritz MRN: [id number] Date of Birth: June 08, 1940 Referring Provider:   April Manson Pulmonary Rehab Walk Test from 03/15/2016 in [place]  Referring Provider  Dr. Halford Chessman      Initial Encounter Date:  Flowsheet Row Pulmonary Rehab Walk Test from 03/15/2016 in [facility]  Date  03/15/16  Referring Provider  Dr. 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  acetaminophen (TYLENOL) 325 MG tablet, Take 2 tablets (650 mg total) by mouth every 6 (six) hours as needed for mild pain (or Fever >/= 101)., Disp: 30 tablet, Rfl: 1 .  albuterol (PROAIR HFA) 108 (90 Base) MCG/ACT inhaler, Inhale 2 puffs into the lungs every 6 (six) hours as needed for wheezing or shortness of breath., Disp: 1 Inhaler, Rfl: 2 .  alendronate (FOSAMAX) 70 MG tablet, Take 70 mg by mouth every Sunday., Disp: , Rfl:  .  amLODipine (NORVASC) 10 MG tablet, Take 1 tablet (10 mg total) by mouth daily. (Patient taking differently: Take 5 mg by mouth daily. ), Disp: 30 tablet, Rfl: 3 .  aspirin EC 81 MG tablet, Take 81 mg by mouth daily., Disp: , Rfl:  .  atorvastatin (LIPITOR) 20 MG tablet, Take 1 tablet (20 mg total) by mouth daily., Disp: 90 tablet, Rfl: 2 .  BESIVANCE 0.6 % SUSP, Place 1 drop into the right eye See admin instructions. Takes the day before and the day of the dr visit 4 times a day, Disp: , Rfl:  .  Cholecalciferol (VITAMIN D) 2000 UNITS CAPS, Take 2,000 Units by mouth daily., Disp: , Rfl:  .  Cyanocobalamin (VITAMIN B-12 PO), Take 1 tablet by mouth daily., Disp: , Rfl:  .  diphenoxylate-atropine (LOMOTIL) 2.5-0.025 MG tablet, Take 1 tablet by mouth 4 (four) times daily as needed for diarrhea or loose stools., Disp: 30 tablet,  Rfl: 0 .  DONNATAL 16.2 MG tablet, Take 16.2 tablets by mouth daily as needed (ibs). , Disp: , Rfl:  .  escitalopram (LEXAPRO) 20 MG tablet, Take 20 mg by mouth daily. , Disp: , Rfl:  .  fluticasone (FLONASE) 50 MCG/ACT nasal spray, Place 2 sprays into the nose daily., Disp: , Rfl:  .  gabapentin (NEURONTIN) 300 MG capsule, Take 1 capsule (300 mg total) by mouth 3 (three) times daily., Disp: 90 capsule, Rfl: 1 .  influenza vac recom quadrivalent (FLUBLOK) 0.5 ML injection, Inject 0.5 mLs into the muscle once., Disp: , Rfl:  .  losartan (COZAAR) 100 MG tablet, Take 1 tablet (100 mg total) by mouth daily. (Patient taking differently: Take 50 mg by mouth 2 (two) times daily. ), Disp: 30 tablet, Rfl: 1 .  mometasone-formoterol (DULERA) 100-5 MCG/ACT AERO, Inhale 2 puffs into the lungs 2 (two) times daily., Disp: 1 Inhaler, Rfl: 5 .  montelukast (SINGULAIR) 10 MG tablet, Take 1 tablet (10 mg total) by mouth daily., Disp: 90 tablet, Rfl: 1 .  Multiple Vitamin (MULTIVITAMIN WITH MINERALS) TABS, Take 1 tablet by mouth daily., Disp: , Rfl:  .  Multiple Vitamins-Minerals (ICAPS AREDS 2 PO), Take 1 tablet by mouth 2 (two) times daily., Disp: , Rfl:  .  Niacin (VITAMIN B-3 PO), Take 1 tablet by mouth daily., Disp: , Rfl:  .  omeprazole (PRILOSEC) 20 MG capsule, Take 1  capsule (20 mg total) by mouth daily., Disp: 30 capsule, Rfl: 1 .  ondansetron (ZOFRAN-ODT) 4 MG disintegrating tablet, Take 1 tablet (4 mg total) by mouth every 8 (eight) hours as needed for nausea or vomiting., Disp: 12 tablet, Rfl: 0 .  pantoprazole (PROTONIX) 40 MG tablet, Take 1 tablet (40 mg total) by mouth daily. (Patient not taking: Reported on 03/14/2016), Disp: 30 tablet, Rfl: 1 .  polyethylene glycol (MIRALAX / GLYCOLAX) packet, Take 17 g by mouth daily as needed for mild constipation., Disp: , Rfl:  .  promethazine (PHENERGAN) 25 MG tablet, Take 1 tablet (25 mg total) by mouth every 6 (six) hours as needed for nausea or vomiting., Disp:  10 tablet, Rfl: 0 .  Riboflavin (VITAMIN B-2 PO), Take 1 tablet by mouth daily., Disp: , Rfl:  .  solifenacin (VESICARE) 10 MG tablet, Take 10 mg by mouth daily. , Disp: , Rfl:  .  tamsulosin (FLOMAX) 0.4 MG CAPS capsule, Take 1 capsule (0.4 mg total) by mouth daily after breakfast. (Patient not taking: Reported on 03/14/2016), Disp: 30 capsule, Rfl: 2  Past Medical History: Past Medical History:  Diagnosis Date  . Allergic rhinitis   . Anxiety   . CAP (community acquired pneumonia)    dx 02-05-2015  . Colitis    dx 02-05-2015  . COPD with emphysema ([place])   . Depression   . Dizziness   . GERD (gastroesophageal reflux disease)   . History of chronic sinusitis   . History of kidney stones   . Hyperlipidemia   . Hypertension   . Irritable bowel syndrome   . Loss of weight   . Pulmonary nodules    benign and stable per CT  . Rash   . Right ureteral stone   . Sensorineural hearing loss, unilateral   . Subjective tinnitus     Tobacco Use: History  Smoking Status  . Former Smoker  . Packs/day: 1.00  . Years: 25.00  . Types: Cigarettes  . Quit date: 02/05/2015  Smokeless Tobacco  . Never Used    Labs: Recent Review Flowsheet Data    Labs for ITP Cardiac and Pulmonary Rehab Latest Ref Rng & Units 11/17/2015 02/24/2016   Cholestrol 0 - 200 mg/dL - 200   LDLCALC 0 - 99 mg/dL - 121(H)   HDL >39.00 mg/dL - 46.10   Trlycerides 0.0 - 149.0 mg/dL - 162.0(H)   Hemoglobin A1c 4.8 - 5.6 % 4.8 -      Capillary Blood Glucose: Lab Results  Component Value Date   GLUCAP 112 (H) 02/09/2015     ADL UCSD:   Pulmonary Function Assessment:     Pulmonary Function Assessment - 03/14/16 1250      Breath   Bilateral Breath Sounds Clear   Shortness of Breath Yes;Limiting activity      Exercise Target Goals:    Exercise Program Goal: Individual exercise prescription set with THRR, safety & activity barriers. Participant demonstrates ability to understand and report RPE  using BORG scale, to self-measure pulse accurately, and to acknowledge the importance of the exercise prescription.  Exercise Prescription Goal: Starting with aerobic activity 30 plus minutes a day, 3 days per week for initial exercise prescription. Provide home exercise prescription and guidelines that participant acknowledges understanding prior to discharge.  Activity Barriers & Risk Stratification:     Activity Barriers & Cardiac Risk Stratification - 03/14/16 1248      Activity Barriers & Cardiac Risk Stratification   Activity Barriers Balance  Concerns;Deconditioning      6 Minute Walk:     6 Minute Walk    Row Name 03/15/16 1550         6 Minute Walk   Phase Initial     Distance 810 feet     Walk Time 6 minutes     # of Rest Breaks 0     MPH 1.53     METS 2.15     RPE 12     Perceived Dyspnea  1     Symptoms No     Resting HR 62 bpm     Resting BP 130/72     Max Ex. HR 101 bpm     Max Ex. BP 130/80       Interval HR   Baseline HR 62     1 Minute HR 101     2 Minute HR 70     3 Minute HR 70     4 Minute HR 70     5 Minute HR 96     6 Minute HR 76     2 Minute Post HR 71     Interval Heart Rate? Yes       Interval Oxygen   Interval Oxygen? Yes     Baseline Oxygen Saturation % 95 %     Baseline Liters of Oxygen 0 L     1 Minute Oxygen Saturation % 92 %     1 Minute Liters of Oxygen 0 L     2 Minute Oxygen Saturation % 94 %     2 Minute Liters of Oxygen 0 L     3 Minute Oxygen Saturation % 94 %     3 Minute Liters of Oxygen 0 L     4 Minute Oxygen Saturation % 94 %     4 Minute Liters of Oxygen 0 L     5 Minute Oxygen Saturation % 96 %     5 Minute Liters of Oxygen 0 L     6 Minute Oxygen Saturation % 95 %     6 Minute Liters of Oxygen 0 L     2 Minute Post Oxygen Saturation % 95 %     2 Minute Post Liters of Oxygen 0 L        Initial Exercise Prescription:     Initial Exercise Prescription - 03/15/16 1500      Date of Initial Exercise RX  and Referring Provider   Date 03/15/16   Referring Provider Dr. Halford Chessman     NuStep   Level 2   Minutes 17   METs 1.5     Arm Ergometer   Level 1   Minutes 17   METs 1.5     Track   Laps 5   Minutes 17     Prescription Details   Frequency (times per week) 2   Duration Progress to 45 minutes of aerobic exercise without signs/symptoms of physical distress     Intensity   THRR 40-80% of Max Heartrate 58-117   Ratings of Perceived Exertion 11-13   Perceived Dyspnea 0-4     Progression   Progression Continue progressive overload as per policy without signs/symptoms or physical distress.     Resistance Training   Training Prescription Yes   Weight orange bands   Reps 10-12      Perform Capillary Blood Glucose checks as needed.  Exercise Prescription Changes:     Exercise Prescription Changes  Row Name 03/22/16 1200 03/24/16 1218 03/29/16 1238 03/31/16 1247 04/05/16 1200     Exercise Review   Progression  - Yes Yes  - Yes     Response to Exercise   Blood Pressure (Admit) 120/68 114/64 124/64 120/66 122/70   Blood Pressure (Exercise) 130/60 136/70 118/70 120/70 144/70   Blood Pressure (Exit) 118/70 100/60 118/76 112/80 100/70   Heart Rate (Admit) 63 bpm 65 bpm 64 bpm 72 bpm 62 bpm   Heart Rate (Exercise) 69 bpm 78 bpm 72 bpm 70 bpm 71 bpm   Heart Rate (Exit) 59 bpm 66 bpm 60 bpm 63 bpm 61 bpm   Oxygen Saturation (Admit) 98 % 95 % 96 % 97 % 97 %   Oxygen Saturation (Exercise) 95 % 94 % 95 % 95 % 94 %   Oxygen Saturation (Exit) 97 % 98 % 98 % 97 % 96 %   Rating of Perceived Exertion (Exercise) 15 13 17 13 14    Perceived Dyspnea (Exercise) 2 2 2 1 2    Duration Progress to 45 minutes of aerobic exercise without signs/symptoms of physical distress Progress to 45 minutes of aerobic exercise without signs/symptoms of physical distress Progress to 45 minutes of aerobic exercise without signs/symptoms of physical distress Progress to 45 minutes of aerobic exercise without  signs/symptoms of physical distress Progress to 45 minutes of aerobic exercise without signs/symptoms of physical distress   Intensity THRR unchanged THRR unchanged THRR unchanged THRR unchanged THRR unchanged     Progression   Progression Continue progressive overload as per policy without signs/symptoms or physical distress. Continue progressive overload as per policy without signs/symptoms or physical distress. Continue progressive overload as per policy without signs/symptoms or physical distress. Continue progressive overload as per policy without signs/symptoms or physical distress. Continue progressive overload as per policy without signs/symptoms or physical distress.     Resistance Training   Training Prescription Yes Yes Yes Yes Yes   Weight orange bands orange bands orange bands orange bands orange bands   Reps 10-12 10-12 10-12 10-12 10-12     NuStep   Level 1 2 3 3 4    Minutes 17 17 17 17 17    METs 1.4 1.7 1.6 1.5 1.7     Arm Ergometer   Level 1  - 1 1 1    Minutes 17  - 17 17 17      Track   Laps 9 6 7   - 8   Minutes 17 17 17   - 17   Row Name 04/07/16 1200 04/12/16 1200 04/14/16 1200 04/19/16 1200       Exercise Review   Progression  -  -  - Yes      Response to Exercise   Blood Pressure (Admit) 120/60 130/68 130/76 112/70    Blood Pressure (Exercise) 124/60 120/64 140/80 130/74    Blood Pressure (Exit) 106/60 102/64 112/70 110/70    Heart Rate (Admit) 65 bpm 76 bpm 70 bpm 64 bpm    Heart Rate (Exercise) 66 bpm 87 bpm 73 bpm 97 bpm    Heart Rate (Exit) 63 bpm 73 bpm 71 bpm 74 bpm    Oxygen Saturation (Admit) 95 % 94 % 93 % 97 %    Oxygen Saturation (Exercise) 96 % 89 % 95 % 95 %    Oxygen Saturation (Exit) 98 % 95 % 93 % 96 %    Rating of Perceived Exertion (Exercise) 13 15 13 15     Perceived Dyspnea (Exercise) 2 2  2 2    Duration Progress to 45 minutes of aerobic exercise without signs/symptoms of physical distress Progress to 45 minutes of aerobic exercise  without signs/symptoms of physical distress Progress to 45 minutes of aerobic exercise without signs/symptoms of physical distress Progress to 45 minutes of aerobic exercise without signs/symptoms of physical distress    Intensity THRR unchanged THRR unchanged THRR unchanged THRR unchanged      Progression   Progression Continue progressive overload as per policy without signs/symptoms or physical distress. Continue progressive overload as per policy without signs/symptoms or physical distress. Continue to progress workloads to maintain intensity without signs/symptoms of physical distress. Continue to progress workloads to maintain intensity without signs/symptoms of physical distress.      Resistance Training   Training Prescription Yes Yes Yes Yes    Weight orange bands orange bands orange bands orange bands    Reps 10-12  10 minutes of strength training 10-12  10 minutes of strength training 10-12  10 minutes of strength training 10-12  10 minutes of strength training      Interval Training   Interval Training No No No No      NuStep   Level 4 4 4 4     Minutes 17 17 17 17     METs 1.9 2 2.6 2      Arm Ergometer   Level 1 1  - 1.5    Minutes 17 17  - 17      Track   Laps  - 6 6 7     Minutes  - 17 17 17        Exercise Comments:     Exercise Comments    Row Name 03/22/16 0839 04/25/16 1640         Exercise Comments Patient will attend first day of exercise today. Will monitor.  Patient is motivated to progress in rehab. She gets excited when she notices herself improving. She is slowly progressing. No longer using assistive device to walk in rehab.          Discharge Exercise Prescription (Final Exercise Prescription Changes):     Exercise Prescription Changes - 04/19/16 1200      Exercise Review   Progression Yes     Response to Exercise   Blood Pressure (Admit) 112/70   Blood Pressure (Exercise) 130/74   Blood Pressure (Exit) 110/70   Heart Rate (Admit) 64  bpm   Heart Rate (Exercise) 97 bpm   Heart Rate (Exit) 74 bpm   Oxygen Saturation (Admit) 97 %   Oxygen Saturation (Exercise) 95 %   Oxygen Saturation (Exit) 96 %   Rating of Perceived Exertion (Exercise) 15   Perceived Dyspnea (Exercise) 2   Duration Progress to 45 minutes of aerobic exercise without signs/symptoms of physical distress   Intensity THRR unchanged     Progression   Progression Continue to progress workloads to maintain intensity without signs/symptoms of physical distress.     Resistance Training   Training Prescription Yes   Weight orange bands   Reps 10-12  10 minutes of strength training     Interval Training   Interval Training No     NuStep   Level 4   Minutes 17   METs 2     Arm Ergometer   Level 1.5   Minutes 17     Track   Laps 7   Minutes 17      Nutrition:  Target Goals: Understanding of nutrition guidelines, daily intake of  sodium 1500mg , cholesterol 200mg , calories 30% from fat and 7% or less from saturated fats, daily to have 5 or more servings of fruits and vegetables.  Biometrics:     Pre Biometrics - 03/14/16 1252      Pre Biometrics   Grip Strength 22 kg       Nutrition Therapy Plan and Nutrition Goals:   Nutrition Discharge: Rate Your Plate Scores:   Psychosocial: Target Goals: Acknowledge presence or absence of depression, maximize coping skills, provide positive support system. Participant is able to verbalize types and ability to use techniques and skills needed for reducing stress and depression.  Initial Review & Psychosocial Screening:     Initial Psych Review & Screening - 03/14/16 1257      Initial Review   Current issues with --  none identified     Family Dynamics   Good Support System? Yes     Barriers   Psychosocial barriers to participate in program There are no identifiable barriers or psychosocial needs.     Screening Interventions   Interventions Encouraged to exercise      Quality of  Life Scores:   PHQ-9: Recent Review Flowsheet Data    Depression screen [facility] 2/9 03/14/2016 10/27/2015 07/06/2015   Decreased Interest 0 0 0   Down, Depressed, Hopeless 1 0 1   PHQ - 2 Score 1 0 1      Psychosocial Evaluation and Intervention:     Psychosocial Evaluation - 03/29/16 0815      Psychosocial Evaluation & Interventions   Interventions Encouraged to exercise with the program and follow exercise prescription      Psychosocial Re-Evaluation:     Psychosocial Re-Evaluation    Row Name 03/29/16 0816 04/26/16 0842           Psychosocial Re-Evaluation   Interventions Encouraged to attend Pulmonary Rehabilitation for the exercise Encouraged to attend Pulmonary Rehabilitation for the exercise      Comments No psychosocial concerns identified at this time. No psychosocial concerns identified at this time.         Education: Education Goals: Education classes will be provided on a weekly basis, covering required topics. Participant will state understanding/return demonstration of topics presented.  Learning Barriers/Preferences:     Learning Barriers/Preferences - 03/14/16 1248      Learning Barriers/Preferences   Learning Barriers None   Learning Preferences Written Material;Verbal Instruction;Video;Skilled Demonstration;Pictoral;Individual Instruction;Group Instruction;Audio      Education Topics: How Lungs Work and Diseases: - Discuss the anatomy of the lungs and diseases that can affect the lungs, such as COPD.   Exercise: -Discuss the importance of exercise, FITT principles of exercise, normal and abnormal responses to exercise, and how to exercise safely.   Environmental Irritants: -Discuss types of environmental irritants and how to limit exposure to environmental irritants.   Meds/Inhalers and oxygen: - Discuss respiratory medications, definition of an inhaler and oxygen, and the proper way to use an inhaler and oxygen.   Energy Saving  Techniques: - Discuss methods to conserve energy and decrease shortness of breath when performing activities of daily living.    Bronchial Hygiene / Breathing Techniques: - Discuss breathing mechanics, pursed-lip breathing technique,  proper posture, effective ways to clear airways, and other functional breathing techniques   Cleaning Equipment: - Provides group verbal and written instruction about the health risks of elevated stress, cause of high stress, and healthy ways to reduce stress.   Nutrition I: Fats: - Discuss the types of cholesterol, what  cholesterol does to the body, and how cholesterol levels can be controlled.   Nutrition II: Labels: -Discuss the different components of food labels and how to read food labels.   Respiratory Infections: - Discuss the signs and symptoms of respiratory infections, ways to prevent respiratory infections, and the importance of seeking medical treatment when having a respiratory infection.   Stress I: Signs and Symptoms: - Discuss the causes of stress, how stress may lead to anxiety and depression, and ways to limit stress.   Stress II: Relaxation: -Discuss relaxation techniques to limit stress.   Oxygen for Home/Travel: - Discuss how to prepare for travel when on oxygen and proper ways to transport and store oxygen to ensure safety.   Knowledge Questionnaire Score:   Core Components/Risk Factors/Patient Goals at Admission:     Personal Goals and Risk Factors at Admission - 03/14/16 1252      Core Components/Risk Factors/Patient Goals on Admission   Increase Strength and Stamina Yes   Improve shortness of breath with ADL's Yes   Develop more efficient breathing techniques such as purse lipped breathing and diaphragmatic breathing; and practicing self-pacing with activity Yes      Core Components/Risk Factors/Patient Goals Review:      Goals and Risk Factor Review    Row Name 03/29/16 0814 03/29/16 0823 04/26/16 0842          Core Components/Risk Factors/Patient Goals Review   Personal Goals Review Increase Strength and Stamina;Improve shortness of breath with ADL's;Develop more efficient breathing techniques such as purse lipped breathing and diaphragmatic breathing and practicing self-pacing with activity.  - Increase Strength and Stamina;Improve shortness of breath with ADL's;Develop more efficient breathing techniques such as purse lipped breathing and diaphragmatic breathing and practicing self-pacing with activity.     Review Patient has only attended 2 session. see comments sections on ITP see comments sections on ITP     Expected Outcomes See admission outcomes  - See admission outcomes        Core Components/Risk Factors/Patient Goals at Discharge (Final Review):      Goals and Risk Factor Review - 04/26/16 0842      Core Components/Risk Factors/Patient Goals Review   Personal Goals Review Increase Strength and Stamina;Improve shortness of breath with ADL's;Develop more efficient breathing techniques such as purse lipped breathing and diaphragmatic breathing and practicing self-pacing with activity.   Review see comments sections on ITP   Expected Outcomes See admission outcomes      ITP Comments:   Comments: ITP REVIEW Pt is making expected progress toward pulmonary rehab goals after completing 9 sessions. Recommend continued exercise, life style modification, education, and utilization of breathing techniques to increase stamina and strength and decrease shortness of breath with exertion.

## 2016-04-27 ENCOUNTER — Ambulatory Visit (INDEPENDENT_AMBULATORY_CARE_PROVIDER_SITE_OTHER): Payer: Medicare Other | Admitting: Family Medicine

## 2016-04-27 ENCOUNTER — Encounter: Payer: Self-pay | Admitting: Family Medicine

## 2016-04-27 VITALS — BP 145/85 | HR 90 | Resp 12 | Ht 62.5 in | Wt 128.2 lb

## 2016-04-27 DIAGNOSIS — M545 Low back pain, unspecified: Secondary | ICD-10-CM | POA: Insufficient documentation

## 2016-04-27 DIAGNOSIS — L989 Disorder of the skin and subcutaneous tissue, unspecified: Secondary | ICD-10-CM

## 2016-04-27 DIAGNOSIS — M810 Age-related osteoporosis without current pathological fracture: Secondary | ICD-10-CM

## 2016-04-27 DIAGNOSIS — K219 Gastro-esophageal reflux disease without esophagitis: Secondary | ICD-10-CM | POA: Diagnosis not present

## 2016-04-27 DIAGNOSIS — F411 Generalized anxiety disorder: Secondary | ICD-10-CM

## 2016-04-27 DIAGNOSIS — I10 Essential (primary) hypertension: Secondary | ICD-10-CM

## 2016-04-27 DIAGNOSIS — G8929 Other chronic pain: Secondary | ICD-10-CM

## 2016-04-27 MED ORDER — OMEPRAZOLE 20 MG PO CPDR: 20.0000 mg | DELAYED_RELEASE_CAPSULE | Freq: Every day | ORAL | 1 refills | Status: DC

## 2016-04-27 NOTE — Progress Notes (Signed)
HPI:   Gina Fritz is a 76 y.o. female, who is here today to follow on some of her chronic medical problems. Since her last OV, 02/24/16, she has followed with pulmonologist, she is doing pulmonary rehab 2 times per week.   She feels like her balance is getting better, still using her cane, no alls since her last OV.  GERD: She is on Omeprazole 20 mg daily. Nausea has resolved,she is eating better, and has gained some wt.  Denies abdominal pain,vomiting, changes in bowel habits, blood in stool or melena.  Alk phoph was checked 02/2016 and improving.   Lab Results  Component Value Date   ALT 13 02/24/2016   AST 15 02/24/2016   ALKPHOS 141 (H) 02/24/2016   BILITOT 0.6 02/24/2016     Concerns today:   -left elbow with "sometihing" that she has noted/palpated for years (since middle school). Lesion is growing slowly, sometimes it is sore if she applies constant moderate pressure when leaning elbow on a hard surface (table). She has not noted skin changes ,elbow artrhalgia, or limitation of ROM.   -Lower back pain bilateral, exacerbated by certain activities:bending, prolonged sitting, riding in a car on bumpy road. She has had lower back pain for many years, states  that she has never had lower back imaging done.  Intermittent, husband feels like it is getting worse. It can be severe but in general it does not limit her daily activities.  She takes OTC Acetaminophen. Pain is not radiated, she denies lower extremity numbness, saddle anesthesia, or associated urine/bowel incontinence. She has not noted skin rash or edema on affected area.   On abdominal CT done 11/11/15: Musculoskeletal: Age-appropriate degenerative changes of the thoracolumbar spine. 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  Her husband also has some questions about osteoporosis. He states that she does not have symptoms and still treatment was  recommended. DEXA 12/2015, I cannot see report.  Started on Fosamax about 3 months ago. She has tolerated medication well.   Hypertension:   Currently on Cozaar 100 mg (50 mg bid) and Amlodipine was decreased to 5 mg by her cardiologists about a month ago. She is not checking BP at home.  She is taking medications as instructed,has not take it today and has not noted side effects reported.  She has not noted unusual headache (on Gabapentin and helping), visual changes, exertional chest pain, dyspnea,  focal weakness, or edema.   Lab Results  Component Value Date   CREATININE 0.87 02/24/2016   BUN 15 02/24/2016   NA 140 02/24/2016   K 4.0 02/24/2016   CL 105 02/24/2016   CO2 25 02/24/2016   Currently she is doing pulmonary PT and her BP is usually checked, 2 times per week.  Hx of HLD, she recently had lipid panel and currently she is on Lipitor 20 mg daily.  History of anxiety and depression, currently she is on Lexapro 20 mg daily. She denies depressed mood or suicidal thoughts. She is sleeping well, except for last night, when she had difficulty sleeping, which is unusual.    Review of Systems  Constitutional: Positive for fatigue (improved). Negative for chills, fever and unexpected weight change.  HENT: Negative for mouth sores, nosebleeds and trouble swallowing.   Eyes: Negative for pain and visual disturbance.  Respiratory: Positive for cough (occasional). Negative for shortness of breath and wheezing.   Cardiovascular: Negative for chest pain, palpitations and leg swelling.  Gastrointestinal: Negative for abdominal pain, blood in stool, nausea and vomiting.       Negative for changes in bowel habits.  Genitourinary: Negative for decreased urine volume, difficulty urinating, dysuria and hematuria.  Musculoskeletal: Positive for back pain and gait problem.  Skin: Negative for rash.  Neurological: Negative for syncope, weakness, numbness and headaches.   Psychiatric/Behavioral: Negative for confusion, sleep disturbance and suicidal ideas. The patient is nervous/anxious.       Current Outpatient Prescriptions on File Prior to Visit  Medication Sig Dispense Refill  . acetaminophen (TYLENOL) 325 MG tablet Take 2 tablets (650 mg total) by mouth every 6 (six) hours as needed for mild pain (or Fever >/= 101). 30 tablet 1  . albuterol (PROAIR HFA) 108 (90 Base) MCG/ACT inhaler Inhale 2 puffs into the lungs every 6 (six) hours as needed for wheezing or shortness of breath. 1 Inhaler 2  . alendronate (FOSAMAX) 70 MG tablet Take 70 mg by mouth every Sunday.    Marland Kitchen amLODipine (NORVASC) 10 MG tablet Take 1 tablet (10 mg total) by mouth daily. (Patient taking differently: Take 5 mg by mouth daily. ) 30 tablet 3  . aspirin EC 81 MG tablet Take 81 mg by mouth daily.    Marland Kitchen atorvastatin (LIPITOR) 20 MG tablet Take 1 tablet (20 mg total) by mouth daily. 90 tablet 2  . BESIVANCE 0.6 % SUSP Place 1 drop into the right eye See admin instructions. Takes the day before and the day of the dr visit 4 times a day    . Cholecalciferol (VITAMIN D) 2000 UNITS CAPS Take 2,000 Units by mouth daily.    . Cyanocobalamin (VITAMIN B-12 PO) Take 1 tablet by mouth daily.    . DONNATAL 16.2 MG tablet Take 16.2 tablets by mouth daily as needed (ibs).     Marland Kitchen escitalopram (LEXAPRO) 20 MG tablet Take 20 mg by mouth daily.     . fluticasone (FLONASE) 50 MCG/ACT nasal spray Place 2 sprays into the nose daily.    Marland Kitchen gabapentin (NEURONTIN) 300 MG capsule Take 1 capsule (300 mg total) by mouth 3 (three) times daily. 90 capsule 1  . influenza vac recom quadrivalent (FLUBLOK) 0.5 ML injection Inject 0.5 mLs into the muscle once.    Marland Kitchen losartan (COZAAR) 100 MG tablet Take 1 tablet (100 mg total) by mouth daily. (Patient taking differently: Take 50 mg by mouth 2 (two) times daily. ) 30 tablet 1  . mometasone-formoterol (DULERA) 100-5 MCG/ACT AERO Inhale 2 puffs into the lungs 2 (two) times daily. 1  Inhaler 5  . montelukast (SINGULAIR) 10 MG tablet Take 1 tablet (10 mg total) by mouth daily. 90 tablet 1  . Multiple Vitamin (MULTIVITAMIN WITH MINERALS) TABS Take 1 tablet by mouth daily.    . Multiple Vitamins-Minerals (ICAPS AREDS 2 PO) Take 1 tablet by mouth 2 (two) times daily.    . Niacin (VITAMIN B-3 PO) Take 1 tablet by mouth daily.    . polyethylene glycol (MIRALAX / GLYCOLAX) packet Take 17 g by mouth daily as needed for mild constipation.    . Riboflavin (VITAMIN B-2 PO) Take 1 tablet by mouth daily.    . solifenacin (VESICARE) 10 MG tablet Take 10 mg by mouth daily.     . tamsulosin (FLOMAX) 0.4 MG CAPS capsule Take 1 capsule (0.4 mg total) by mouth daily after breakfast. 30 capsule 2   No current facility-administered medications on file prior to visit.      Past Medical  History:  Diagnosis Date  . Allergic rhinitis   . Anxiety   . CAP (community acquired pneumonia)    dx 02-05-2015  . Colitis    dx 02-05-2015  . COPD with emphysema ([place])   . Depression   . Dizziness   . GERD (gastroesophageal reflux disease)   . History of chronic sinusitis   . History of kidney stones   . Hyperlipidemia   . Hypertension   . Irritable bowel syndrome   . Loss of weight   . Pulmonary nodules    benign and stable per CT  . Rash   . Right ureteral stone   . Sensorineural hearing loss, unilateral   . Subjective tinnitus    Allergies  Allergen Reactions  . Penicillins Anaphylaxis    Has patient had a PCN reaction causing immediate rash, facial/tongue/throat swelling, SOB or lightheadedness with hypotension: Yes Has patient had a PCN reaction causing severe rash involving mucus membranes or skin necrosis: Yes Has patient had a PCN reaction that required hospitalization Yes Has patient had a PCN reaction occurring within the last 10 years: No If all of the above answers are "NO", then may proceed with Cephalosporin use.   . Sulfa Antibiotics Hives  . Soy Allergy Itching and  Rash    Social History   Social History  . Marital status: Married    Spouse name: [name]  . Number of children: 3  . Years of education: college   Occupational History  .      Retired   Social History Main Topics  . Smoking status: Former Smoker    Packs/day: 1.00    Years: 25.00    Types: Cigarettes    Quit date: 02/05/2015  . Smokeless tobacco: Never Used  . Alcohol use 0.6 oz/week    1 Glasses of wine per week     Comment: One drink a week.  . Drug use: No  . Sexual activity: Not Asked   Other Topics Concern  . None   Social History Narrative   Patient is married [name]) and lives at home with her husband.   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   Caffeine- Six cups of coffee daily and six cups of coke cola daily.          Vitals:   04/27/16 0852 04/27/16 0934  BP: (!) 170/98 (!) 145/85  Pulse: 90   Resp: 12    Body mass index is 23.08 kg/m.   Wt Readings from Last 3 Encounters:  04/27/16 128 lb 4 oz (58.2 kg)  04/26/16 128 lb 1.4 oz (58.1 kg)  04/19/16 129 lb 3 oz (58.6 kg)    121 Lb 6 oz 02/24/16   Physical Exam  Nursing note and vitals reviewed. Constitutional: She is oriented to person, place, and time. She appears well-developed and well-nourished. No distress.  HENT:  Head: Atraumatic.  Mouth/Throat: Oropharynx is clear and moist and mucous membranes are normal.  Eyes: Conjunctivae and EOM are normal. Pupils are equal, round, and reactive to light.  Neck: No tracheal deviation present. No thyroid mass and no thyromegaly present.  Cardiovascular: Normal rate and regular rhythm.   No murmur heard. DP pulses present bilateral.  Respiratory: Effort normal and breath sounds normal. No respiratory distress.  GI: Soft. She exhibits no mass. There is no hepatomegaly. There is no tenderness.  Musculoskeletal: She exhibits no edema or tenderness.       Left  elbow: She exhibits normal range of motion  and no swelling.       Lumbar back: She exhibits no tenderness and no bony tenderness.  Neurological: She is alert and oriented to person, place, and time. She has normal strength. Coordination normal.  Reflex Scores:      Patellar reflexes are 2+ on the right side and 2+ on the left side. Stable gait assisted with cane. SLR negative bilateral.  Skin: Skin is warm. No rash noted. No erythema.  Left elbow: palpable superficial lesion, 1.5-2 cm, very mobile, no tender, no skin changes  Psychiatric: She has a normal mood and affect.  Well groomed, good eye contact.      ASSESSMENT AND PLAN:    Armine was seen today for follow-up.  Diagnoses and all orders for this visit:  Gastroesophageal reflux disease, esophagitis presence not specified  Well controlled, nausea resolved and appetite improved. No changes in current management. GERD precautions to continue. F/U in 6 months.  -     omeprazole (PRILOSEC) 20 MG capsule; Take 1 capsule (20 mg total) by mouth daily.  Essential hypertension, benign  Re-checked and still mildly elevated, she has not taken her meds today. Reporting BP checks during PT, "normal." No changes in current management. Monitor BP at home. DASH-low salt diet recommended. Eye exam periodically. F/U in 6 months, before if needed.  Lesion of subcutaneous tissue  Most likely benign. ? Lipoma, fibroma. Lesion is small, so she agrees with continue monitoring for now.  Osteoporosis, unspecified osteoporosis type, unspecified pathological fracture presence  We dicussed screening recommendations, symptoms , Dx, and treatment. Fall precautions. Some side effects of medications use for treatment. Continue Fosamax, currently recommended completing for 5 years treatment DEXA in 4-5 years.  Chronic bilateral low back pain without sciatica  Could be aggravated by recent wt gain. Plain lumbar X ray could be done since she has not had one done before, she is not  interested in imaging for now. OTC Icy Hot with Lidocaine may help. Avoid activities that exacerbate pain. Cymbalta is an option in the future if worsening.  Generalized anxiety disorder  Stable. No changes in Lexapro. F/U in 6 months.    -Ms. Autum Benfer was advised to return sooner than planned today if new concerns arise.       Sharonna Vinje G. [place], MD  [facility]. [place] office.

## 2016-04-27 NOTE — Progress Notes (Signed)
Pre visit review using our clinic review tool, if applicable. No additional management support is needed unless otherwise documented below in the visit note. 

## 2016-04-27 NOTE — Patient Instructions (Signed)
A few things to remember from today's visit:   Essential hypertension, benign  Osteoporosis, unspecified osteoporosis type, unspecified pathological fracture presence  Chronic bilateral low back pain without sciatica  Fall precautions. Continue PT.  Icy Hot with Lidocaine. No changes in GERD med.   Please be sure medication list is accurate. If a new problem present, please set up appointment sooner than planned today.

## 2016-04-28 ENCOUNTER — Encounter (HOSPITAL_COMMUNITY): Payer: Medicare Other

## 2016-04-28 ENCOUNTER — Other Ambulatory Visit: Payer: Self-pay | Admitting: Family Medicine

## 2016-04-28 ENCOUNTER — Telehealth (HOSPITAL_COMMUNITY): Payer: Self-pay | Admitting: Family Medicine

## 2016-04-28 DIAGNOSIS — R519 Headache, unspecified: Secondary | ICD-10-CM

## 2016-04-28 DIAGNOSIS — R51 Headache: Principal | ICD-10-CM

## 2016-05-03 ENCOUNTER — Encounter (HOSPITAL_COMMUNITY)
Admission: RE | Admit: 2016-05-03 | Discharge: 2016-05-03 | Disposition: A | Discharge status: Home / Self Care | Payer: Medicare Other | Source: Ambulatory Visit | Attending: Pulmonary Disease | Admitting: Pulmonary Disease

## 2016-05-03 ENCOUNTER — Encounter (HOSPITAL_COMMUNITY): Payer: Medicare Other

## 2016-05-03 VITALS — Wt 128.3 lb

## 2016-05-03 DIAGNOSIS — J439 Emphysema, unspecified: Secondary | ICD-10-CM

## 2016-05-03 DIAGNOSIS — J432 Centrilobular emphysema: Secondary | ICD-10-CM

## 2016-05-03 NOTE — Progress Notes (Signed)
Daily Session Note  Patient Details  Name: Gina Fritz MRN: [id number] Date of Birth: 1940/11/28 Referring Provider:   April Manson Pulmonary Rehab Walk Test from 03/15/2016 in [place]  Referring Provider  Dr. Halford Chessman      Encounter Date: 05/03/2016  Check In:     Session Check In - 05/03/16 1031      Check-In   Location [facility]   Staff Present Rosebud Poles, RN, BSN;Molly diVincenzo, MS, ACSM RCEP, Exercise Physiologist;Lisa Ysidro Evert, RN;Portia Rollene Rotunda, RN, BSN   Supervising physician immediately available to respond to emergencies [facility] immediately available   Physician(s) Dr. Posey Pronto   Medication changes reported     No   Fall or balance concerns reported    No   Warm-up and Cool-down Performed as group-led instruction   Resistance Training Performed Yes   VAD Patient? No     Pain Assessment   Currently in Pain? No/denies   Multiple Pain Sites No      Capillary Blood Glucose: No results found for this or any previous visit (from the past 24 hour(s)).      Exercise Prescription Changes - 05/03/16 1200      Response to Exercise   Blood Pressure (Admit) 118/70   Blood Pressure (Exercise) 100/60   Blood Pressure (Exit) 96/60   Heart Rate (Admit) 78 bpm   Heart Rate (Exercise) 105 bpm   Heart Rate (Exit) 72 bpm   Oxygen Saturation (Admit) 94 %   Oxygen Saturation (Exercise) 94 %   Oxygen Saturation (Exit) 97 %   Rating of Perceived Exertion (Exercise) 15   Perceived Dyspnea (Exercise) 1   Duration Progress to 45 minutes of aerobic exercise without signs/symptoms of physical distress   Intensity THRR unchanged     Progression   Progression Continue to progress workloads to maintain intensity without signs/symptoms of physical distress.     Resistance Training   Training Prescription Yes   Weight orange bands   Reps 10-12  10 minutes of sttrength training     Interval Training   Interval  Training No     NuStep   Level 4   Minutes 17   METs 2     Arm Ergometer   Level 1.5   Minutes 17     Track   Laps 7   Minutes 17     Goals Met:  Exercise tolerated well Strength training completed today  Goals Unmet:  Not Applicable  Comments: Service time is from1030 to 1200    Dr. Rush Farmer is Medical Director for Pulmonary Rehab at [facility].

## 2016-05-03 NOTE — Progress Notes (Signed)
I have reviewed a Home Exercise Prescription with Gina Fritz . Gina Fritz is not currently exercising at home.  The patient was advised to walk 2-3 days a week for 30 minutes.  Gina Fritz and I discussed how to progress their exercise prescription.  The patient stated that their goals were to be able to do more housework, do grocery shopping on her own, and cook.  The patient stated that they understand the exercise prescription.  We reviewed exercise guidelines, target heart rate during exercise, oxygen use, weather, home pulse oximeter, endpoints for exercise, and goals.  Patient is encouraged to come to me with any questions. I will continue to follow up with the patient to assist them with progression and safety.

## 2016-05-05 ENCOUNTER — Encounter (HOSPITAL_COMMUNITY): Payer: Medicare Other

## 2016-05-10 ENCOUNTER — Encounter (HOSPITAL_COMMUNITY)
Admission: RE | Admit: 2016-05-10 | Discharge: 2016-05-10 | Disposition: A | Discharge status: Home / Self Care | Payer: Medicare Other | Source: Ambulatory Visit | Attending: Pulmonary Disease | Admitting: Pulmonary Disease

## 2016-05-10 ENCOUNTER — Encounter (HOSPITAL_COMMUNITY): Payer: Medicare Other

## 2016-05-10 VITALS — Wt 128.3 lb

## 2016-05-10 DIAGNOSIS — J439 Emphysema, unspecified: Secondary | ICD-10-CM | POA: Insufficient documentation

## 2016-05-10 DIAGNOSIS — J432 Centrilobular emphysema: Secondary | ICD-10-CM

## 2016-05-10 NOTE — Progress Notes (Signed)
Daily Session Note  Patient Details  Name: Gina Fritz MRN: [id number] Date of Birth: [birth year] Referring Provider:   April Manson Pulmonary Rehab Walk Test from 03/15/2016 in [place]  Referring Provider  Dr. Halford Chessman      Encounter Date: 05/10/2016  Check In:     Session Check In - 05/10/16 1059      Check-In   Location [facility]   Staff Present Rosebud Poles, RN, BSN;Paisleigh Maroney, MS, ACSM RCEP, Exercise Physiologist;Lisa Ysidro Evert, RN;Portia Rollene Rotunda, RN, BSN   Supervising physician immediately available to respond to emergencies [facility] immediately available   Physician(s) Dr. Cathlean Sauer   Medication changes reported     No   Fall or balance concerns reported    No   Warm-up and Cool-down Performed as group-led instruction   Resistance Training Performed Yes   VAD Patient? No     Pain Assessment   Currently in Pain? No/denies   Multiple Pain Sites No      Capillary Blood Glucose: No results found for this or any previous visit (from the past 24 hour(s)).      Exercise Prescription Changes - 05/10/16 1200      Exercise Review   Progression Yes     Response to Exercise   Blood Pressure (Admit) 102/60   Blood Pressure (Exercise) 130/60   Blood Pressure (Exit) 106/64   Heart Rate (Admit) 77 bpm   Heart Rate (Exercise) 102 bpm   Heart Rate (Exit) 76 bpm   Oxygen Saturation (Admit) 98 %   Oxygen Saturation (Exercise) 94 %   Oxygen Saturation (Exit) 97 %   Rating of Perceived Exertion (Exercise) 13   Perceived Dyspnea (Exercise) 2   Duration Progress to 45 minutes of aerobic exercise without signs/symptoms of physical distress   Intensity THRR unchanged     Progression   Progression Continue to progress workloads to maintain intensity without signs/symptoms of physical distress.     Resistance Training   Training Prescription Yes   Weight orange bands   Reps 10-12  10 minutes of sttrength  training     Interval Training   Interval Training No     NuStep   Level 4   Minutes 17   METs 2.3     Arm Ergometer   Level 2   Minutes 17     Track   Laps 9   Minutes 17     Goals Met:  Exercise tolerated well No report of cardiac concerns or symptoms Strength training completed today  Goals Unmet:  Not Applicable  Comments: Service time is from 10:30am to 12:05p    Dr. Rush Farmer is Medical Director for Pulmonary Rehab at [facility].

## 2016-05-11 ENCOUNTER — Other Ambulatory Visit: Payer: Self-pay | Admitting: Family Medicine

## 2016-05-12 ENCOUNTER — Encounter (HOSPITAL_COMMUNITY): Payer: Medicare Other

## 2016-05-12 ENCOUNTER — Encounter (HOSPITAL_COMMUNITY)
Admission: RE | Admit: 2016-05-12 | Discharge: 2016-05-12 | Disposition: A | Discharge status: Home / Self Care | Payer: Medicare Other | Source: Ambulatory Visit | Attending: Pulmonary Disease | Admitting: Pulmonary Disease

## 2016-05-12 VITALS — Wt 129.2 lb

## 2016-05-12 DIAGNOSIS — J432 Centrilobular emphysema: Secondary | ICD-10-CM

## 2016-05-12 DIAGNOSIS — J439 Emphysema, unspecified: Secondary | ICD-10-CM

## 2016-05-12 NOTE — Telephone Encounter (Signed)
Okay to refill? 

## 2016-05-12 NOTE — Progress Notes (Addendum)
Daily Session Note  Patient Details  Name: Gina Fritz MRN: [id number] Date of Birth: Mar 12, 1941 Referring Provider:   April Manson Pulmonary Rehab Walk Test from 03/15/2016 in [place]  Referring Provider  Dr. Halford Chessman      Encounter Date: 05/12/2016  Check In:     Session Check In - 05/12/16 1030      Check-In   Location [facility]   Staff Present Rosebud Poles, RN, BSN;Molly diVincenzo, MS, ACSM RCEP, Exercise Physiologist;Tayte Mcwherter Ysidro Evert, RN;Portia Rollene Rotunda, RN, BSN   Supervising physician immediately available to respond to emergencies [facility] immediately available   Physician(s) Dr. Algis Liming   Medication changes reported     No   Fall or balance concerns reported    No   Warm-up and Cool-down Performed as group-led instruction   Resistance Training Performed Yes   VAD Patient? No     Pain Assessment   Currently in Pain? No/denies   Multiple Pain Sites No      Capillary Blood Glucose: No results found for this or any previous visit (from the past 24 hour(s)).      Exercise Prescription Changes - 05/12/16 1200      Response to Exercise   Blood Pressure (Admit) 100/54   Blood Pressure (Exercise) 138/70   Blood Pressure (Exit) 100/64   Heart Rate (Admit) 71 bpm   Heart Rate (Exercise) 80 bpm   Heart Rate (Exit) 61 bpm   Oxygen Saturation (Admit) 97 %   Oxygen Saturation (Exercise) 96 %   Oxygen Saturation (Exit) 97 %   Rating of Perceived Exertion (Exercise) 13   Perceived Dyspnea (Exercise) 2   Duration Progress to 45 minutes of aerobic exercise without signs/symptoms of physical distress   Intensity THRR unchanged     Progression   Progression Continue to progress workloads to maintain intensity without signs/symptoms of physical distress.     Resistance Training   Training Prescription Yes   Weight orange bands   Reps 10-12  10 minutes of strength training     Interval Training   Interval  Training No     NuStep   Level 4   Minutes 17   METs 1.7     Arm Ergometer   Level 2   Minutes 17     Goals Met:  Exercise tolerated well No report of cardiac concerns or symptoms Strength training completed today  Goals Unmet:  Not Applicable  Comments: Service time is from 1030 to 1240 Attended Doctor Day class today.   Dr. Rush Farmer is Medical Director for Pulmonary Rehab at [facility].

## 2016-05-12 NOTE — Progress Notes (Signed)
Gina Fritz 76 y.o. female Nutrition Note Spoke with pt. Pt is at a normal wt. Pt has gained 14-18 lb over the past year. Pt states she wants to maintain her current wt. Pt eats 3 meals a day. There are some ways the pt can make her eating habits healthier. Pt's Rate Your Plate results reviewed with pt. Pt does not avoid salty food. Pt expressed understanding of the information reviewed via feedback method.    Lab Results  Component Value Date   HGBA1C 4.8 11/17/2015   Nutrition Diagnosis ? Food-and nutrition-related knowledge deficit related to lack of exposure to information as related to diagnosis of pulmonary disease  Nutrition Intervention ? Pt's individual nutrition plan and goals reviewed with pt. ? Benefits of adopting healthy eating habits discussed when pt's Rate Your Plate reviewed. ? Pt to attend the Nutrition and Lung Disease class ? Continual client-centered nutrition education by RD, as part of interdisciplinary care.  Goal(s) 1. Increase dairy consumed to at least 1 serving/day 2. Increase fruit and vegetables consumed by splitting meals into 5-6 meals/day if needed. 3. Increase consumption of whole grains by adding 100% whole grain bread/pasta/rice to daily intake. 4. Add nuts/legumes as a meatless option for meals or as a snack at least 1-2 times/week Monitor and Evaluate progress toward nutrition goal with team.   Derek Mound, M.Ed, RD, LDN, CDE 05/12/2016 1:31 PM

## 2016-05-17 ENCOUNTER — Encounter (HOSPITAL_COMMUNITY)
Admission: RE | Admit: 2016-05-17 | Discharge: 2016-05-17 | Disposition: A | Discharge status: Home / Self Care | Payer: Medicare Other | Source: Ambulatory Visit | Attending: Pulmonary Disease | Admitting: Pulmonary Disease

## 2016-05-17 ENCOUNTER — Encounter (HOSPITAL_COMMUNITY): Payer: Medicare Other

## 2016-05-17 VITALS — Wt 128.3 lb

## 2016-05-17 DIAGNOSIS — J439 Emphysema, unspecified: Secondary | ICD-10-CM | POA: Diagnosis not present

## 2016-05-17 NOTE — Progress Notes (Signed)
Pulmonary Individual Treatment Plan  Patient Details  Name: Gina Fritz MRN: [id number] Date of Birth: 19-Feb-1941 Referring Provider:   April Manson Pulmonary Rehab Walk Test from 03/15/2016 in [place]  Referring Provider  Dr. Halford Chessman      Initial Encounter Date:  Flowsheet Row Pulmonary Rehab Walk Test from 03/15/2016 in [place]  Date  03/15/16  Referring Provider  Dr. Halford Chessman      Visit Diagnosis: Pulmonary emphysema, unspecified emphysema type ([place])  Patient's Home Medications on Admission:   Current Outpatient Prescriptions:  .  acetaminophen (TYLENOL) 325 MG tablet, Take 2 tablets (650 mg total) by mouth every 6 (six) hours as needed for mild pain (or Fever >/= 101)., Disp: 30 tablet, Rfl: 1 .  albuterol (PROAIR HFA) 108 (90 Base) MCG/ACT inhaler, Inhale 2 puffs into the lungs every 6 (six) hours as needed for wheezing or shortness of breath., Disp: 1 Inhaler, Rfl: 2 .  alendronate (FOSAMAX) 70 MG tablet, Take 70 mg by mouth every Sunday., Disp: , Rfl:  .  amLODipine (NORVASC) 10 MG tablet, Take 1 tablet (10 mg total) by mouth daily. (Patient taking differently: Take 5 mg by mouth daily. ), Disp: 30 tablet, Rfl: 3 .  aspirin EC 81 MG tablet, Take 81 mg by mouth daily., Disp: , Rfl:  .  atorvastatin (LIPITOR) 20 MG tablet, Take 1 tablet (20 mg total) by mouth daily., Disp: 90 tablet, Rfl: 2 .  BESIVANCE 0.6 % SUSP, Place 1 drop into the right eye See admin instructions. Takes the day before and the day of the dr visit 4 times a day, Disp: , Rfl:  .  Cholecalciferol (VITAMIN D) 2000 UNITS CAPS, Take 2,000 Units by mouth daily., Disp: , Rfl:  .  Cyanocobalamin (VITAMIN B-12 PO), Take 1 tablet by mouth daily., Disp: , Rfl:  .  DONNATAL 16.2 MG tablet, Take 16.2 tablets by mouth daily as needed (ibs). , Disp: , Rfl:  .  escitalopram (LEXAPRO) 20 MG tablet, Take 20 mg by mouth daily. , Disp: , Rfl:  .   fluticasone (FLONASE) 50 MCG/ACT nasal spray, Place 2 sprays into the nose daily., Disp: , Rfl:  .  gabapentin (NEURONTIN) 300 MG capsule, TAKE 1 CAPSULE(300 MG) BY MOUTH THREE TIMES DAILY, Disp: 90 capsule, Rfl: 2 .  influenza vac recom quadrivalent (FLUBLOK) 0.5 ML injection, Inject 0.5 mLs into the muscle once., Disp: , Rfl:  .  losartan (COZAAR) 50 MG tablet, Take 1 tablet (50 mg total) by mouth 2 (two) times daily., Disp: 180 tablet, Rfl: 1 .  mometasone-formoterol (DULERA) 100-5 MCG/ACT AERO, Inhale 2 puffs into the lungs 2 (two) times daily., Disp: 1 Inhaler, Rfl: 5 .  montelukast (SINGULAIR) 10 MG tablet, Take 1 tablet (10 mg total) by mouth daily., Disp: 90 tablet, Rfl: 1 .  Multiple Vitamin (MULTIVITAMIN WITH MINERALS) TABS, Take 1 tablet by mouth daily., Disp: , Rfl:  .  Multiple Vitamins-Minerals (ICAPS AREDS 2 PO), Take 1 tablet by mouth 2 (two) times daily., Disp: , Rfl:  .  Niacin (VITAMIN B-3 PO), Take 1 tablet by mouth daily., Disp: , Rfl:  .  omeprazole (PRILOSEC) 20 MG capsule, Take 1 capsule (20 mg total) by mouth daily., Disp: 90 capsule, Rfl: 1 .  polyethylene glycol (MIRALAX / GLYCOLAX) packet, Take 17 g by mouth daily as needed for mild constipation., Disp: , Rfl:  .  Riboflavin (VITAMIN B-2 PO), Take 1 tablet by  mouth daily., Disp: , Rfl:  .  solifenacin (VESICARE) 10 MG tablet, Take 10 mg by mouth daily. , Disp: , Rfl:  .  tamsulosin (FLOMAX) 0.4 MG CAPS capsule, Take 1 capsule (0.4 mg total) by mouth daily after breakfast., Disp: 30 capsule, Rfl: 2  Past Medical History: Past Medical History:  Diagnosis Date  . Allergic rhinitis   . Anxiety   . CAP (community acquired pneumonia)    dx 02-05-2015  . Colitis    dx 02-05-2015  . COPD with emphysema ([place])   . Depression   . Dizziness   . GERD (gastroesophageal reflux disease)   . History of chronic sinusitis   . History of kidney stones   . Hyperlipidemia   . Hypertension   . Irritable bowel syndrome   . Loss  of weight   . Pulmonary nodules    benign and stable per CT  . Rash   . Right ureteral stone   . Sensorineural hearing loss, unilateral   . Subjective tinnitus     Tobacco Use: History  Smoking Status  . Former Smoker  . Packs/day: 1.00  . Years: 25.00  . Types: Cigarettes  . Quit date: 02/05/2015  Smokeless Tobacco  . Never Used    Labs: Recent Review Flowsheet Data    Labs for ITP Cardiac and Pulmonary Rehab Latest Ref Rng & Units 11/17/2015 02/24/2016   Cholestrol 0 - 200 mg/dL - 200   LDLCALC 0 - 99 mg/dL - 121(H)   HDL >39.00 mg/dL - 46.10   Trlycerides 0.0 - 149.0 mg/dL - 162.0(H)   Hemoglobin A1c 4.8 - 5.6 % 4.8 -      Capillary Blood Glucose: Lab Results  Component Value Date   GLUCAP 112 (H) 02/09/2015     ADL UCSD:   Pulmonary Function Assessment:     Pulmonary Function Assessment - 03/14/16 1250      Breath   Bilateral Breath Sounds Clear   Shortness of Breath Yes;Limiting activity      Exercise Target Goals:    Exercise Program Goal: Individual exercise prescription set with THRR, safety & activity barriers. Participant demonstrates ability to understand and report RPE using BORG scale, to self-measure pulse accurately, and to acknowledge the importance of the exercise prescription.  Exercise Prescription Goal: Starting with aerobic activity 30 plus minutes a day, 3 days per week for initial exercise prescription. Provide home exercise prescription and guidelines that participant acknowledges understanding prior to discharge.  Activity Barriers & Risk Stratification:     Activity Barriers & Cardiac Risk Stratification - 03/14/16 1248      Activity Barriers & Cardiac Risk Stratification   Activity Barriers Balance Concerns;Deconditioning      6 Minute Walk:     6 Minute Walk    Row Name 03/15/16 1550         6 Minute Walk   Phase Initial     Distance 810 feet     Walk Time 6 minutes     # of Rest Breaks 0     MPH 1.53      METS 2.15     RPE 12     Perceived Dyspnea  1     Symptoms No     Resting HR 62 bpm     Resting BP 130/72     Max Ex. HR 101 bpm     Max Ex. BP 130/80       Interval HR   Baseline HR 62  1 Minute HR 101     2 Minute HR 70     3 Minute HR 70     4 Minute HR 70     5 Minute HR 96     6 Minute HR 76     2 Minute Post HR 71     Interval Heart Rate? Yes       Interval Oxygen   Interval Oxygen? Yes     Baseline Oxygen Saturation % 95 %     Baseline Liters of Oxygen 0 L     1 Minute Oxygen Saturation % 92 %     1 Minute Liters of Oxygen 0 L     2 Minute Oxygen Saturation % 94 %     2 Minute Liters of Oxygen 0 L     3 Minute Oxygen Saturation % 94 %     3 Minute Liters of Oxygen 0 L     4 Minute Oxygen Saturation % 94 %     4 Minute Liters of Oxygen 0 L     5 Minute Oxygen Saturation % 96 %     5 Minute Liters of Oxygen 0 L     6 Minute Oxygen Saturation % 95 %     6 Minute Liters of Oxygen 0 L     2 Minute Post Oxygen Saturation % 95 %     2 Minute Post Liters of Oxygen 0 L        Initial Exercise Prescription:     Initial Exercise Prescription - 03/15/16 1500      Date of Initial Exercise RX and Referring Provider   Date 03/15/16   Referring Provider Dr. Halford Chessman     NuStep   Level 2   Minutes 17   METs 1.5     Arm Ergometer   Level 1   Minutes 17   METs 1.5     Track   Laps 5   Minutes 17     Prescription Details   Frequency (times per week) 2   Duration Progress to 45 minutes of aerobic exercise without signs/symptoms of physical distress     Intensity   THRR 40-80% of Max Heartrate 58-117   Ratings of Perceived Exertion 11-13   Perceived Dyspnea 0-4     Progression   Progression Continue progressive overload as per policy without signs/symptoms or physical distress.     Resistance Training   Training Prescription Yes   Weight orange bands   Reps 10-12      Perform Capillary Blood Glucose checks as needed.  Exercise Prescription  Changes:     Exercise Prescription Changes    Row Name 03/22/16 1200 03/24/16 1218 03/29/16 1238 03/31/16 1247 04/05/16 1200     Exercise Review   Progression  - Yes Yes  - Yes     Response to Exercise   Blood Pressure (Admit) 120/68 114/64 124/64 120/66 122/70   Blood Pressure (Exercise) 130/60 136/70 118/70 120/70 144/70   Blood Pressure (Exit) 118/70 100/60 118/76 112/80 100/70   Heart Rate (Admit) 63 bpm 65 bpm 64 bpm 72 bpm 62 bpm   Heart Rate (Exercise) 69 bpm 78 bpm 72 bpm 70 bpm 71 bpm   Heart Rate (Exit) 59 bpm 66 bpm 60 bpm 63 bpm 61 bpm   Oxygen Saturation (Admit) 98 % 95 % 96 % 97 % 97 %   Oxygen Saturation (Exercise) 95 % 94 % 95 % 95 % 94 %  Oxygen Saturation (Exit) 97 % 98 % 98 % 97 % 96 %   Rating of Perceived Exertion (Exercise) 15 13 17 13 14    Perceived Dyspnea (Exercise) 2 2 2 1 2    Duration Progress to 45 minutes of aerobic exercise without signs/symptoms of physical distress Progress to 45 minutes of aerobic exercise without signs/symptoms of physical distress Progress to 45 minutes of aerobic exercise without signs/symptoms of physical distress Progress to 45 minutes of aerobic exercise without signs/symptoms of physical distress Progress to 45 minutes of aerobic exercise without signs/symptoms of physical distress   Intensity THRR unchanged THRR unchanged THRR unchanged THRR unchanged THRR unchanged     Progression   Progression Continue progressive overload as per policy without signs/symptoms or physical distress. Continue progressive overload as per policy without signs/symptoms or physical distress. Continue progressive overload as per policy without signs/symptoms or physical distress. Continue progressive overload as per policy without signs/symptoms or physical distress. Continue progressive overload as per policy without signs/symptoms or physical distress.     Resistance Training   Training Prescription Yes Yes Yes Yes Yes   Weight orange bands orange  bands orange bands orange bands orange bands   Reps 10-12 10-12 10-12 10-12 10-12     NuStep   Level 1 2 3 3 4    Minutes 17 17 17 17 17    METs 1.4 1.7 1.6 1.5 1.7     Arm Ergometer   Level 1  - 1 1 1    Minutes 17  - 17 17 17      Track   Laps 9 6 7   - 8   Minutes 17 17 17   - 17   Row Name 04/07/16 1200 04/12/16 1200 04/14/16 1200 04/19/16 1200 04/26/16 1210     Exercise Review   Progression  -  -  - Yes Yes     Response to Exercise   Blood Pressure (Admit) 120/60 130/68 130/76 112/70 114/58   Blood Pressure (Exercise) 124/60 120/64 140/80 130/74 112/66   Blood Pressure (Exit) 106/60 102/64 112/70 110/70 112/68   Heart Rate (Admit) 65 bpm 76 bpm 70 bpm 64 bpm 71 bpm   Heart Rate (Exercise) 66 bpm 87 bpm 73 bpm 97 bpm 84 bpm   Heart Rate (Exit) 63 bpm 73 bpm 71 bpm 74 bpm 70 bpm   Oxygen Saturation (Admit) 95 % 94 % 93 % 97 % 97 %   Oxygen Saturation (Exercise) 96 % 89 % 95 % 95 % 96 %   Oxygen Saturation (Exit) 98 % 95 % 93 % 96 % 97 %   Rating of Perceived Exertion (Exercise) 13 15 13 15 15    Perceived Dyspnea (Exercise) 2 2 2 2 2    Duration Progress to 45 minutes of aerobic exercise without signs/symptoms of physical distress Progress to 45 minutes of aerobic exercise without signs/symptoms of physical distress Progress to 45 minutes of aerobic exercise without signs/symptoms of physical distress Progress to 45 minutes of aerobic exercise without signs/symptoms of physical distress Progress to 45 minutes of aerobic exercise without signs/symptoms of physical distress   Intensity THRR unchanged THRR unchanged THRR unchanged THRR unchanged THRR unchanged     Progression   Progression Continue progressive overload as per policy without signs/symptoms or physical distress. Continue progressive overload as per policy without signs/symptoms or physical distress. Continue to progress workloads to maintain intensity without signs/symptoms of physical distress. Continue to progress  workloads to maintain intensity without signs/symptoms of physical distress. Continue to  progress workloads to maintain intensity without signs/symptoms of physical distress.     Resistance Training   Training Prescription Yes Yes Yes Yes Yes   Weight orange bands orange bands orange bands orange bands orange bands   Reps 10-12  10 minutes of strength training 10-12  10 minutes of strength training 10-12  10 minutes of strength training 10-12  10 minutes of strength training 10-12  10 minutes of strength training     Interval Training   Interval Training No No No No No     NuStep   Level 4 4 4 4 4    Minutes 17 17 17 17 17    METs 1.9 2 2.6 2 1.8     Arm Ergometer   Level 1 1  - 1.5 1.5   Minutes 17 17  - 31 17     Track   Laps  - 6 6 7 7    Minutes  - 17 17 17 17    Row Name 05/03/16 1200 05/10/16 1200 05/12/16 1200         Exercise Review   Progression  - Yes  -       Response to Exercise   Blood Pressure (Admit) 118/70 102/60 100/54     Blood Pressure (Exercise) 100/60 130/60 138/70     Blood Pressure (Exit) 96/60 106/64 100/64     Heart Rate (Admit) 78 bpm 77 bpm 71 bpm     Heart Rate (Exercise) 105 bpm 102 bpm 80 bpm     Heart Rate (Exit) 72 bpm 76 bpm 61 bpm     Oxygen Saturation (Admit) 94 % 98 % 97 %     Oxygen Saturation (Exercise) 94 % 94 % 96 %     Oxygen Saturation (Exit) 97 % 97 % 97 %     Rating of Perceived Exertion (Exercise) 15 13 13      Perceived Dyspnea (Exercise) 1 2 2      Duration Progress to 45 minutes of aerobic exercise without signs/symptoms of physical distress Progress to 45 minutes of aerobic exercise without signs/symptoms of physical distress Progress to 45 minutes of aerobic exercise without signs/symptoms of physical distress     Intensity THRR unchanged THRR unchanged THRR unchanged       Progression   Progression Continue to progress workloads to maintain intensity without signs/symptoms of physical distress. Continue to progress  workloads to maintain intensity without signs/symptoms of physical distress. Continue to progress workloads to maintain intensity without signs/symptoms of physical distress.       Resistance Training   Training Prescription Yes Yes Yes     Weight orange bands orange bands orange bands     Reps 10-12  10 minutes of sttrength training 10-12  10 minutes of sttrength training 10-12  10 minutes of strength training       Interval Training   Interval Training No No No       NuStep   Level 4 4 4      Minutes 17 17 17      METs 2 2.3 1.7       Arm Ergometer   Level 1.5 2 2      Minutes 17 17 17        Track   Laps 7 9  -     Minutes 17 [place] to continue exercise at [place] 3  additional days to program exercise sessions.  -  -        Exercise Comments:     Exercise Comments    Row Name 03/22/16 [id number] 04/25/16 1640 05/03/16 1222 05/16/16 1254     Exercise Comments Patient will attend first day of exercise today. Will monitor.  Patient is motivated to progress in rehab. She gets excited when she notices herself improving. She is slowly progressing. No longer using assistive device to walk in rehab.  Home exercise completed Patient is up to walking 9 laps in 15 minutes. Patient needs reminding and encouraging to increase intensities and work at a higher rate. Will cont. to encourage.       Discharge Exercise Prescription (Final Exercise Prescription Changes):     Exercise Prescription Changes - 05/12/16 1200      Response to Exercise   Blood Pressure (Admit) 100/54   Blood Pressure (Exercise) 138/70   Blood Pressure (Exit) 100/64   Heart Rate (Admit) 71 bpm   Heart Rate (Exercise) 80 bpm   Heart Rate (Exit) 61 bpm   Oxygen Saturation (Admit) 97 %   Oxygen Saturation (Exercise) 96 %   Oxygen Saturation (Exit) 97 %   Rating of Perceived Exertion (Exercise) 13   Perceived Dyspnea (Exercise) 2   Duration Progress to 45  minutes of aerobic exercise without signs/symptoms of physical distress   Intensity THRR unchanged     Progression   Progression Continue to progress workloads to maintain intensity without signs/symptoms of physical distress.     Resistance Training   Training Prescription Yes   Weight orange bands   Reps 10-12  10 minutes of strength training     Interval Training   Interval Training No     NuStep   Level 4   Minutes 17   METs 1.7     Arm Ergometer   Level 2   Minutes 17       Nutrition:  Target Goals: Understanding of nutrition guidelines, daily intake of sodium 1500mg , cholesterol 200mg , calories 30% from fat and 7% or less from saturated fats, daily to have 5 or more servings of fruits and vegetables.  Biometrics:     Pre Biometrics - 03/14/16 1252      Pre Biometrics   Grip Strength 22 kg       Nutrition Therapy Plan and Nutrition Goals:     Nutrition Therapy & Goals - 05/12/16 1344      Nutrition Therapy   Diet General, Healthful     Personal Nutrition Goals   Personal Goal #1 Maintain current wt while in Pulmonary Rehab   Personal Goal #2 Increase dairy consumed to at least 1 serving/d   Personal Goal #3 Increase fruit and vegetables consumed by splitting meals into 5-6 meals/day if needed.   Personal Goal #4 Increase consumption of 100% whole grain bread, cereals, rice, and pasta.   Additional Goals? Yes   Personal Goal #5 Add nuts or nut butter as a meat substitute or snack at least 1-2 times/week     Intervention Plan   Intervention Prescribe, educate and counsel regarding individualized specific dietary modifications aiming towards targeted core components such as weight, hypertension, lipid management, diabetes, heart failure and other comorbidities.   Expected Outcomes Short Term Goal: Understand basic principles of dietary content, such as calories, fat, sodium, cholesterol and nutrients.;Long Term Goal: Adherence to prescribed nutrition  plan.      Nutrition Discharge: Rate Your Plate Scores:  Nutrition Assessments - 05/12/16 1344      Rate Your Plate Scores   Pre Score 42      Psychosocial: Target Goals: Acknowledge presence or absence of depression, maximize coping skills, provide positive support system. Participant is able to verbalize types and ability to use techniques and skills needed for reducing stress and depression.  Initial Review & Psychosocial Screening:     Initial Psych Review & Screening - 03/14/16 1257      Initial Review   Current issues with --  none identified     Family Dynamics   Good Support System? Yes     Barriers   Psychosocial barriers to participate in program There are no identifiable barriers or psychosocial needs.     Screening Interventions   Interventions Encouraged to exercise      Quality of Life Scores:   PHQ-9: Recent Review Flowsheet Data    Depression screen [facility] 2/9 03/14/2016 10/27/2015 07/06/2015   Decreased Interest 0 0 0   Down, Depressed, Hopeless 1 0 1   PHQ - 2 Score 1 0 1      Psychosocial Evaluation and Intervention:     Psychosocial Evaluation - 03/29/16 0815      Psychosocial Evaluation & Interventions   Interventions Encouraged to exercise with the program and follow exercise prescription      Psychosocial Re-Evaluation:     Psychosocial Re-Evaluation    [place] Name 03/29/16 0816 04/26/16 0842 05/16/16 1615         Psychosocial Re-Evaluation   Interventions Encouraged to attend Pulmonary Rehabilitation for the exercise Encouraged to attend Pulmonary Rehabilitation for the exercise Encouraged to attend Pulmonary Rehabilitation for the exercise     Comments No psychosocial concerns identified at this time. No psychosocial concerns identified at this time. No psychosocial concerns identified at this time.       Education: Education Goals: Education classes will be provided on a weekly basis, covering required topics. Participant  will state understanding/return demonstration of topics presented.  Learning Barriers/Preferences:     Learning Barriers/Preferences - 03/14/16 1248      Learning Barriers/Preferences   Learning Barriers None   Learning Preferences Written Material;Verbal Instruction;Video;Skilled Demonstration;Pictoral;Individual Instruction;Group Instruction;Audio      Education Topics: Risk Factor Reduction:  -Group instruction that is supported by a PowerPoint presentation. Instructor discusses the definition of a risk factor, different risk factors for pulmonary disease, and how the heart and lungs work together.     Nutrition for Pulmonary Patient:  -Group instruction provided by PowerPoint slides, verbal discussion, and written materials to support subject matter. The instructor gives an explanation and review of healthy diet recommendations, which includes a discussion on weight management, recommendations for fruit and vegetable consumption, as well as protein, fluid, caffeine, fiber, sodium, sugar, and alcohol. Tips for eating when patients are short of breath are discussed. Flowsheet Row PULMONARY REHAB OTHER RESPIRATORY from 05/12/2016 in [place]  Date  04/14/16  Educator  [place]na  Instruction Review Code  2- meets goals/outcomes      Pursed Lip Breathing:  -Group instruction that is supported by demonstration and informational handouts. Instructor discusses the benefits of pursed lip and diaphragmatic breathing and detailed demonstration on how to preform both.   Flowsheet Row PULMONARY REHAB OTHER RESPIRATORY from 05/12/2016 in [place]  Date  03/24/16  Educator  EP  Instruction Review Code  2- meets goals/outcomes      Oxygen Safety:  -  Group instruction provided by PowerPoint, verbal discussion, and written material to support subject matter. There is an overview of "What is Oxygen" and "Why do we need it".  Instructor  also reviews how to create a safe environment for oxygen use, the importance of using oxygen as prescribed, and the risks of noncompliance. There is a brief discussion on traveling with oxygen and resources the patient may utilize. Flowsheet Row PULMONARY REHAB OTHER RESPIRATORY from 10/15/2015 in [place]  Date  09/17/15  Educator  RN  Instruction Review Code  2- meets goals/outcomes      Oxygen Equipment:  -Group instruction provided by [facility] Staff utilizing handouts, written materials, and equipment demonstrations. Flowsheet Row PULMONARY REHAB OTHER RESPIRATORY from 05/12/2016 in [place]  Date  04/07/16  Educator  [employer]  Instruction Review Code  2- meets goals/outcomes      Signs and Symptoms:  -Group instruction provided by written material and verbal discussion to support subject matter. Warning signs and symptoms of infection, stroke, and heart attack are reviewed and when to call the physician/911 reinforced. Tips for preventing the spread of infection discussed. Flowsheet Row PULMONARY REHAB OTHER RESPIRATORY from 10/15/2015 in [place]  Date  10/15/15  Educator  RN  Instruction Review Code  2- meets goals/outcomes      Advanced Directives:  -Group instruction provided by verbal instruction and written material to support subject matter. Instructor reviews Advanced Directive laws and proper instruction for filling out document. Flowsheet Row PULMONARY REHAB OTHER RESPIRATORY from 10/15/2015 in [place]  Date  08/20/15  Educator  Jeanella Craze  Instruction Review Code  2- meets goals/outcomes      Pulmonary Video:  -Group video education that reviews the importance of medication and oxygen compliance, exercise, good nutrition, pulmonary hygiene, and pursed lip and diaphragmatic breathing for the pulmonary patient. Flowsheet Row  PULMONARY REHAB OTHER RESPIRATORY from 10/15/2015 in [place]  Date  09/10/15  Instruction Review Code  2- meets goals/outcomes      Exercise for the Pulmonary Patient:  -Group instruction that is supported by a PowerPoint presentation. Instructor discusses benefits of exercise, core components of exercise, frequency, duration, and intensity of an exercise routine, importance of utilizing pulse oximetry during exercise, safety while exercising, and options of places to exercise outside of rehab.   Flowsheet Row PULMONARY REHAB OTHER RESPIRATORY from 10/15/2015 in [place]  Date  10/08/15  Educator  EP  Instruction Review Code  2- meets goals/outcomes      Pulmonary Medications:  -Verbally interactive group education provided by instructor with focus on inhaled medications and proper administration. Flowsheet Row PULMONARY REHAB OTHER RESPIRATORY from 10/15/2015 in [place]  Date  07/16/15  Educator  RT  Instruction Review Code  2- meets goals/outcomes      Anatomy and Physiology of the Respiratory System and Intimacy:  -Group instruction provided by PowerPoint, verbal discussion, and written material to support subject matter. Instructor reviews respiratory cycle and anatomical components of the respiratory system and their functions. Instructor also reviews differences in obstructive and restrictive respiratory diseases with examples of each. Intimacy, Sex, and Sexuality differences are reviewed with a discussion on how relationships can change when diagnosed with pulmonary disease. Common sexual concerns are reviewed. Flowsheet Row PULMONARY REHAB OTHER RESPIRATORY from 10/15/2015 in [facility]  REHAB  Date  08/13/15  Educator  RN  Instruction Review Code  2- meets goals/outcomes      Knowledge Questionnaire Score:   Core Components/Risk Factors/Patient  Goals at Admission:     Personal Goals and Risk Factors at Admission - 03/14/16 1252      Core Components/Risk Factors/Patient Goals on Admission   Increase Strength and Stamina Yes   Improve shortness of breath with ADL's Yes   Develop more efficient breathing techniques such as purse lipped breathing and diaphragmatic breathing; and practicing self-pacing with activity Yes      Core Components/Risk Factors/Patient Goals Review:      Goals and Risk Factor Review    Row Name 03/29/16 0814 03/29/16 0823 04/26/16 0842 05/16/16 1615       Core Components/Risk Factors/Patient Goals Review   Personal Goals Review Increase Strength and Stamina;Improve shortness of breath with ADL's;Develop more efficient breathing techniques such as purse lipped breathing and diaphragmatic breathing and practicing self-pacing with activity.  - Increase Strength and Stamina;Improve shortness of breath with ADL's;Develop more efficient breathing techniques such as purse lipped breathing and diaphragmatic breathing and practicing self-pacing with activity. Increase Strength and Stamina;Improve shortness of breath with ADL's;Develop more efficient breathing techniques such as purse lipped breathing and diaphragmatic breathing and practicing self-pacing with activity.    Review Patient has only attended 2 session. see comments sections on ITP see comments sections on ITP see comments sections on ITP    Expected Outcomes See admission outcomes  - See admission outcomes See admission outcomes       Core Components/Risk Factors/Patient Goals at Discharge (Final Review):      Goals and Risk Factor Review - 05/16/16 1615      Core Components/Risk Factors/Patient Goals Review   Personal Goals Review Increase Strength and Stamina;Improve shortness of breath with ADL's;Develop more efficient breathing techniques such as purse lipped breathing and diaphragmatic breathing and practicing self-pacing with activity.    Review see comments sections on ITP   Expected Outcomes See admission outcomes      ITP Comments:   Comments: ITP REVIEW Pt is making expected progress toward pulmonary rehab goals after completing 13 sessions. Recommend continued exercise, life style modification, education, and utilization of breathing techniques to increase stamina and strength and decrease shortness of breath with exertion.

## 2016-05-17 NOTE — Progress Notes (Signed)
Daily Session Note  Patient Details  Name: Gina Fritz MRN: [id number] Date of Birth: [birth year] Referring Provider:   April [facility] Pulmonary Rehab Walk Test from 03/15/2016 in [place]  Referring Provider  Dr. Halford Chessman      Encounter Date: 05/17/2016  Check In:     Session Check In - 05/17/16 1029      Check-In   Location [facility]   Staff Present Rosebud Poles, RN, BSN;Molly diVincenzo, MS, ACSM RCEP, Exercise Physiologist;Florabelle Cardin Ysidro Evert, RN;Portia Rollene Rotunda, RN, BSN   Supervising physician immediately available to respond to emergencies [facility] immediately available   Physician(s) Dr. Posey Pronto   Medication changes reported     No   Fall or balance concerns reported    No   Warm-up and Cool-down Performed as group-led instruction   Resistance Training Performed Yes   VAD Patient? No     Pain Assessment   Currently in Pain? No/denies   Multiple Pain Sites No      Capillary Blood Glucose: No results found for this or any previous visit (from the past 24 hour(s)).      Exercise Prescription Changes - 05/17/16 1200      Response to Exercise   Blood Pressure (Admit) 102/60   Blood Pressure (Exercise) 110/64   Blood Pressure (Exit) 102/70   Heart Rate (Admit) 74 bpm   Heart Rate (Exercise) 82 bpm   Heart Rate (Exit) 81 bpm   Oxygen Saturation (Admit) 96 %   Oxygen Saturation (Exercise) 96 %   Oxygen Saturation (Exit) 99 %   Rating of Perceived Exertion (Exercise) 14   Perceived Dyspnea (Exercise) 3   Duration Progress to 45 minutes of aerobic exercise without signs/symptoms of physical distress   Intensity THRR unchanged     Progression   Progression Continue to progress workloads to maintain intensity without signs/symptoms of physical distress.     Resistance Training   Training Prescription Yes   Weight orange bands   Reps 10-12  10 minutes of strength training     Interval Training   Interval  Training No     NuStep   Level 4   Minutes 17   METs 2     Arm Ergometer   Level 2   Minutes 17     Track   Laps 6   Minutes 17     Goals Met:  Exercise tolerated well No report of cardiac concerns or symptoms Strength training completed today  Goals Unmet:  Not Applicable  Comments: Service time is from 1030 to 1205    Dr. Rush Farmer is Medical Director for Pulmonary Rehab at [facility].

## 2016-05-19 ENCOUNTER — Telehealth: Payer: Self-pay | Admitting: Family Medicine

## 2016-05-19 ENCOUNTER — Encounter (HOSPITAL_COMMUNITY): Payer: Medicare Other

## 2016-05-19 ENCOUNTER — Other Ambulatory Visit: Payer: Self-pay | Admitting: Family Medicine

## 2016-05-19 ENCOUNTER — Telehealth (HOSPITAL_COMMUNITY): Payer: Self-pay | Admitting: Family Medicine

## 2016-05-19 ENCOUNTER — Ambulatory Visit: Payer: Medicare Other

## 2016-05-19 NOTE — Telephone Encounter (Signed)
Called and spoke with patient's husband. Patient has been taking it because she's having some trouble with her rectum and the Donnatal helps to relieve it.

## 2016-05-19 NOTE — Telephone Encounter (Signed)
Pt needs refill on donnatal 16.2 mg #90 send to [facility]

## 2016-05-19 NOTE — Telephone Encounter (Signed)
This is the second request I have received. I have not prescribed this medication to Ms Eiland, so I usually do not prescribe this type of medication, not sure why she is taking it for; so I do not feel comfortable filling it. I wonder if she received Rx from provider in [place] or GI.  Thanks, BJ

## 2016-05-20 ENCOUNTER — Ambulatory Visit: Payer: Medicare Other

## 2016-05-24 ENCOUNTER — Encounter (HOSPITAL_COMMUNITY): Payer: Medicare Other

## 2016-05-24 ENCOUNTER — Encounter (HOSPITAL_COMMUNITY)
Admission: RE | Admit: 2016-05-24 | Discharge: 2016-05-24 | Disposition: A | Discharge status: Home / Self Care | Payer: Medicare Other | Source: Ambulatory Visit | Attending: Pulmonary Disease | Admitting: Pulmonary Disease

## 2016-05-24 VITALS — Wt 129.6 lb

## 2016-05-24 DIAGNOSIS — J439 Emphysema, unspecified: Secondary | ICD-10-CM

## 2016-05-24 DIAGNOSIS — J432 Centrilobular emphysema: Secondary | ICD-10-CM

## 2016-05-24 NOTE — Progress Notes (Signed)
Daily Session Note  Patient Details  Name: Gina Fritz MRN: [id number] Date of Birth: Nov 03, 1940 Referring Provider:   April [facility]on Pulmonary Rehab Walk Test from 03/15/2016 in [place]  Referring Provider  Dr. Halford Chessman      Encounter Date: 05/24/2016  Check In:     Session Check In - 05/24/16 1030      Check-In   Location [facility]   Staff Present Rosebud Poles, RN, BSN;Molly diVincenzo, MS, ACSM RCEP, Exercise Physiologist;Eulon Allnutt Ysidro Evert, RN;Portia Rollene Rotunda, RN, BSN   Supervising physician immediately available to respond to emergencies [facility] immediately available   Physician(s) Dr. Candiss Norse   Medication changes reported     No   Fall or balance concerns reported    No   Warm-up and Cool-down Performed as group-led instruction   Resistance Training Performed Yes   VAD Patient? No     Pain Assessment   Currently in Pain? No/denies   Multiple Pain Sites No      Capillary Blood Glucose: No results found for this or any previous visit (from the past 24 hour(s)).      Exercise Prescription Changes - 05/24/16 1200      Response to Exercise   Blood Pressure (Admit) 112/68   Blood Pressure (Exercise) 100/70   Blood Pressure (Exit) 102/72   Heart Rate (Admit) 65 bpm   Heart Rate (Exercise) 84 bpm   Heart Rate (Exit) 69 bpm   Oxygen Saturation (Admit) 95 %   Oxygen Saturation (Exercise) 96 %   Oxygen Saturation (Exit) 98 %   Rating of Perceived Exertion (Exercise) 14   Perceived Dyspnea (Exercise) 3   Duration Progress to 45 minutes of aerobic exercise without signs/symptoms of physical distress   Intensity THRR unchanged     Progression   Progression Continue to progress workloads to maintain intensity without signs/symptoms of physical distress.     Resistance Training   Training Prescription Yes   Weight orange bands   Reps 10-12  10 minutes of strength training     Interval Training   Interval  Training No     NuStep   Level 4   Minutes 17   METs 2.1     Arm Ergometer   Level 2   Minutes 17     Track   Laps 3   Minutes 17     Goals Met:  Exercise tolerated well No report of cardiac concerns or symptoms Strength training completed today  Goals Unmet:  Not Applicable  Comments: Service time is from 1030 to 1200    Dr. Rush Farmer is Medical Director for Pulmonary Rehab at [facility].

## 2016-05-25 ENCOUNTER — Ambulatory Visit (INDEPENDENT_AMBULATORY_CARE_PROVIDER_SITE_OTHER): Payer: Medicare Other

## 2016-05-25 ENCOUNTER — Telehealth: Payer: Self-pay | Admitting: Family Medicine

## 2016-05-25 VITALS — BP 124/80 | HR 66 | Ht 62.0 in | Wt 128.0 lb

## 2016-05-25 DIAGNOSIS — Z0000 Encounter for general adult medical examination without abnormal findings: Secondary | ICD-10-CM

## 2016-05-25 NOTE — Telephone Encounter (Signed)
Pt needs refill on Donnatal 16.2 Mg from Dr Marlyn Corporal.  She said everything is being transferred over.  Pharmacy: [address]

## 2016-05-25 NOTE — Patient Instructions (Addendum)
Ms. Gina Fritz , Thank you for taking time to come for your Medicare Wellness Visit. I appreciate your ongoing commitment to your health goals. Please review the following plan we discussed and let me know if I can assist you in the future.   Spouse will call Dr. Nicki Reaper for last visit Seeing the doctor for right eye MD;   Will discuss mammogram with Dr. Martinique; Can schedule anywhere , anytime  Goes to [facility]   Given permission to order the colo-guard  I have copied your immunization record from [facility]  Per [place] record Tdap /07/02/2012 Prevnar 13 10/25/2013 PSV 23 01/28/1998 and 05/25/2010 Zoster 01/07/2008  It is fine for her to have another PSV 23 per pulmonary    These are the goals we discussed: Goals    . patient          Gina Fritz more together          . patient          Goal is to clean home  Little bit every day  Walking with spouse when temp are moderate Will walk 3 days        This is a list of the screening recommended for you and due dates:  Health Maintenance  Topic Date Due  . Tetanus Vaccine  07/05/1960  . Mammogram  07/06/1991  . Colon Cancer Screening  07/06/1991  . DEXA scan (bone density measurement)  07/06/2006  . Flu Shot  11/03/2015  . Pneumonia vaccines (2 of 2 - PCV13) 05/21/2016        Fall Prevention in the Home Introduction Falls can cause injuries. They can happen to people of all ages. There are many things you can do to make your home safe and to help prevent falls. What can I do on the outside of my home?  Regularly fix the edges of walkways and driveways and fix any cracks.  Remove anything that might make you trip as you walk through a door, such as a raised step or threshold.  Trim any bushes or trees on the path to your home.  Use bright outdoor lighting.  Clear any walking paths of anything that might make someone trip, such as rocks or tools.  Regularly check to see if handrails are loose or broken.  Make sure that both sides of any steps have handrails.  Any raised decks and porches should have guardrails on the edges.  Have any leaves, snow, or ice cleared regularly.  Use sand or salt on walking paths during winter.  Clean up any spills in your garage right away. This includes oil or grease spills. What can I do in the bathroom?  Use night lights.  Install grab bars by the toilet and in the tub and shower. Do not use towel bars as grab bars.  Use non-skid mats or decals in the tub or shower.  If you need to sit down in the shower, use a plastic, non-slip stool.  Keep the floor dry. Clean up any water that spills on the floor as soon as it happens.  Remove soap buildup in the tub or shower regularly.  Attach bath mats securely with double-sided non-slip rug tape.  Do not have throw rugs and other things on the floor that can make you trip. What can I do in the bedroom?  Use night lights.  Make sure that you have a light by your bed that is easy to reach.  Do not use any sheets  or blankets that are too big for your bed. They should not hang down onto the floor.  Have a firm chair that has side arms. You can use this for support while you get dressed.  Do not have throw rugs and other things on the floor that can make you trip. What can I do in the kitchen?  Clean up any spills right away.  Avoid walking on wet floors.  Keep items that you use a lot in easy-to-reach places.  If you need to reach something above you, use a strong step stool that has a grab bar.  Keep electrical cords out of the way.  Do not use floor polish or wax that makes floors slippery. If you must use wax, use non-skid floor wax.  Do not have throw rugs and other things on the floor that can make you trip. What can I do with my stairs?  Do not leave any items on the stairs.  Make sure that there are handrails on both sides of the stairs and use them. Fix handrails that are broken or  loose. Make sure that handrails are as long as the stairways.  Check any carpeting to make sure that it is firmly attached to the stairs. Fix any carpet that is loose or worn.  Avoid having throw rugs at the top or bottom of the stairs. If you do have throw rugs, attach them to the floor with carpet tape.  Make sure that you have a light switch at the top of the stairs and the bottom of the stairs. If you do not have them, ask someone to add them for you. What else can I do to help prevent falls?  Wear shoes that:  Do not have high heels.  Have rubber bottoms.  Are comfortable and fit you well.  Are closed at the toe. Do not wear sandals.  If you use a stepladder:  Make sure that it is fully opened. Do not climb a closed stepladder.  Make sure that both sides of the stepladder are locked into place.  Ask someone to hold it for you, if possible.  Clearly mark and make sure that you can see:  Any grab bars or handrails.  First and last steps.  Where the edge of each step is.  Use tools that help you move around (mobility aids) if they are needed. These include:  Canes.  Walkers.  Scooters.  Crutches.  Turn on the lights when you go into a dark area. Replace any light bulbs as soon as they burn out.  Set up your furniture so you have a clear path. Avoid moving your furniture around.  If any of your floors are uneven, fix them.  If there are any pets around you, be aware of where they are.  Review your medicines with your doctor. Some medicines can make you feel dizzy. This can increase your chance of falling. Ask your doctor what other things that you can do to help prevent falls. This information is not intended to replace advice given to you by your health care provider. Make sure you discuss any questions you have with your health care provider. Document Released: 01/15/2009 Document Revised: 08/27/2015 Document Reviewed: 04/25/2014  2017 Elsevier  Health  Maintenance, Female Introduction Adopting a healthy lifestyle and getting preventive care can go a long way to promote health and wellness. Talk with your health care provider about what schedule of regular examinations is right for you. This is a  good chance for you to check in with your provider about disease prevention and staying healthy. In between checkups, there are plenty of things you can do on your own. Experts have done a lot of research about which lifestyle changes and preventive measures are most likely to keep you healthy. Ask your health care provider for more information. Weight and diet Eat a healthy diet  Be sure to include plenty of vegetables, fruits, low-fat dairy products, and lean protein.  Do not eat a lot of foods high in solid fats, added sugars, or salt.  Get regular exercise. This is one of the most important things you can do for your health.  Most adults should exercise for at least 150 minutes each week. The exercise should increase your heart rate and make you sweat (moderate-intensity exercise).  Most adults should also do strengthening exercises at least twice a week. This is in addition to the moderate-intensity exercise. Maintain a healthy weight  Body mass index (BMI) is a measurement that can be used to identify possible weight problems. It estimates body fat based on height and weight. Your health care provider can help determine your BMI and help you achieve or maintain a healthy weight.  For females 4 years of age and older:  A BMI below 18.5 is considered underweight.  A BMI of 18.5 to 24.9 is normal.  A BMI of 25 to 29.9 is considered overweight.  A BMI of 30 and above is considered obese. Watch levels of cholesterol and blood lipids  You should start having your blood tested for lipids and cholesterol at 75 years of age, then have this test every 5 years.  You may need to have your cholesterol levels checked more often if:  Your lipid  or cholesterol levels are high.  You are older than 75 years of age.  You are at high risk for heart disease. Cancer screening Lung Cancer  Lung cancer screening is recommended for adults 34-104 years old who are at high risk for lung cancer because of a history of smoking.  A yearly low-dose CT scan of the lungs is recommended for people who:  Currently smoke.  Have quit within the past 15 years.  Have at least a 30-pack-year history of smoking. A pack year is smoking an average of one pack of cigarettes a day for 1 year.  Yearly screening should continue until it has been 15 years since you quit.  Yearly screening should stop if you develop a health problem that would prevent you from having lung cancer treatment. Breast Cancer  Practice breast self-awareness. This means understanding how your breasts normally appear and feel.  It also means doing regular breast self-exams. Let your health care provider know about any changes, no matter how small.  If you are in your 20s or 30s, you should have a clinical breast exam (CBE) by a health care provider every 1-3 years as part of a regular health exam.  If you are 59 or older, have a CBE every year. Also consider having a breast X-ray (mammogram) every year.  If you have a family history of breast cancer, talk to your health care provider about genetic screening.  If you are at high risk for breast cancer, talk to your health care provider about having an MRI and a mammogram every year.  Breast cancer gene (BRCA) assessment is recommended for women who have family members with BRCA-related cancers. BRCA-related cancers include:  Breast.  Ovarian.  Tubal.  Peritoneal cancers.  Results of the assessment will determine the need for genetic counseling and BRCA1 and BRCA2 testing. Cervical Cancer  Your health care provider may recommend that you be screened regularly for cancer of the pelvic organs (ovaries, uterus, and vagina).  This screening involves a pelvic examination, including checking for microscopic changes to the surface of your cervix (Pap test). You may be encouraged to have this screening done every 3 years, beginning at age 62.  For women ages 60-65, health care providers may recommend pelvic exams and Pap testing every 3 years, or they may recommend the Pap and pelvic exam, combined with testing for human papilloma virus (HPV), every 5 years. Some types of HPV increase your risk of cervical cancer. Testing for HPV may also be done on women of any age with unclear Pap test results.  Other health care providers may not recommend any screening for nonpregnant women who are considered low risk for pelvic cancer and who do not have symptoms. Ask your health care provider if a screening pelvic exam is right for you.  If you have had past treatment for cervical cancer or a condition that could lead to cancer, you need Pap tests and screening for cancer for at least 20 years after your treatment. If Pap tests have been discontinued, your risk factors (such as having a new sexual partner) need to be reassessed to determine if screening should resume. Some women have medical problems that increase the chance of getting cervical cancer. In these cases, your health care provider may recommend more frequent screening and Pap tests. Colorectal Cancer  This type of cancer can be detected and often prevented.  Routine colorectal cancer screening usually begins at 75 years of age and continues through 75 years of age.  Your health care provider may recommend screening at an earlier age if you have risk factors for colon cancer.  Your health care provider may also recommend using home test kits to check for hidden blood in the stool.  A small camera at the end of a tube can be used to examine your colon directly (sigmoidoscopy or colonoscopy). This is done to check for the earliest forms of colorectal cancer.  Routine  screening usually begins at age 47.  Direct examination of the colon should be repeated every 5-10 years through 75 years of age. However, you may need to be screened more often if early forms of precancerous polyps or small growths are found. Skin Cancer  Check your skin from head to toe regularly.  Tell your health care provider about any new moles or changes in moles, especially if there is a change in a mole's shape or color.  Also tell your health care provider if you have a mole that is larger than the size of a pencil eraser.  Always use sunscreen. Apply sunscreen liberally and repeatedly throughout the day.  Protect yourself by wearing long sleeves, pants, a wide-brimmed hat, and sunglasses whenever you are outside. Heart disease, diabetes, and high blood pressure  High blood pressure causes heart disease and increases the risk of stroke. High blood pressure is more likely to develop in:  People who have blood pressure in the high end of the normal range (130-139/85-89 mm Hg).  People who are overweight or obese.  People who are African American.  If you are 84-23 years of age, have your blood pressure checked every 3-5 years. If you are 75 years of age or older, have your blood pressure  checked every year. You should have your blood pressure measured twice-once when you are at a hospital or clinic, and once when you are not at a hospital or clinic. Record the average of the two measurements. To check your blood pressure when you are not at a hospital or clinic, you can use:  An automated blood pressure machine at a pharmacy.  A home blood pressure monitor.  If you are between 31 years and 58 years old, ask your health care provider if you should take aspirin to prevent strokes.  Have regular diabetes screenings. This involves taking a blood sample to check your fasting blood sugar level.  If you are at a normal weight and have a low risk for diabetes, have this test once  every three years after 75 years of age.  If you are overweight and have a high risk for diabetes, consider being tested at a younger age or more often. Preventing infection Hepatitis B  If you have a higher risk for hepatitis B, you should be screened for this virus. You are considered at high risk for hepatitis B if:  You were born in a country where hepatitis B is common. Ask your health care provider which countries are considered high risk.  Your parents were born in a high-risk country, and you have not been immunized against hepatitis B (hepatitis B vaccine).  You have HIV or AIDS.  You use needles to inject street drugs.  You live with someone who has hepatitis B.  You have had sex with someone who has hepatitis B.  You get hemodialysis treatment.  You take certain medicines for conditions, including cancer, organ transplantation, and autoimmune conditions. Hepatitis C  Blood testing is recommended for:  Everyone born from [birth year] through 1964.  Anyone with known risk factors for hepatitis C. Sexually transmitted infections (STIs)  You should be screened for sexually transmitted infections (STIs) including gonorrhea and chlamydia if:  You are sexually active and are younger than 75 years of age.  You are older than 75 years of age and your health care provider tells you that you are at risk for this type of infection.  Your sexual activity has changed since you were last screened and you are at an increased risk for chlamydia or gonorrhea. Ask your health care provider if you are at risk.  If you do not have HIV, but are at risk, it may be recommended that you take a prescription medicine daily to prevent HIV infection. This is called pre-exposure prophylaxis (PrEP). You are considered at risk if:  You are sexually active and do not regularly use condoms or know the HIV status of your partner(s).  You take drugs by injection.  You are sexually active with a partner  who has HIV. Talk with your health care provider about whether you are at high risk of being infected with HIV. If you choose to begin PrEP, you should first be tested for HIV. You should then be tested every 3 months for as long as you are taking PrEP. Pregnancy  If you are premenopausal and you may become pregnant, ask your health care provider about preconception counseling.  If you may become pregnant, take 400 to 800 micrograms (mcg) of folic acid every day.  If you want to prevent pregnancy, talk to your health care provider about birth control (contraception). Osteoporosis and menopause  Osteoporosis is a disease in which the bones lose minerals and strength with aging. This can result in  serious bone fractures. Your risk for osteoporosis can be identified using a bone density scan.  If you are 25 years of age or older, or if you are at risk for osteoporosis and fractures, ask your health care provider if you should be screened.  Ask your health care provider whether you should take a calcium or vitamin D supplement to lower your risk for osteoporosis.  Menopause may have certain physical symptoms and risks.  Hormone replacement therapy may reduce some of these symptoms and risks. Talk to your health care provider about whether hormone replacement therapy is right for you. Follow these instructions at home:  Schedule regular health, dental, and eye exams.  Stay current with your immunizations.  Do not use any tobacco products including cigarettes, chewing tobacco, or electronic cigarettes.  If you are pregnant, do not drink alcohol.  If you are breastfeeding, limit how much and how often you drink alcohol.  Limit alcohol intake to no more than 1 drink per day for nonpregnant women. One drink equals 12 ounces of beer, 5 ounces of wine, or 1 ounces of hard liquor.  Do not use street drugs.  Do not share needles.  Ask your health care provider for help if you need support  or information about quitting drugs.  Tell your health care provider if you often feel depressed.  Tell your health care provider if you have ever been abused or do not feel safe at home. This information is not intended to replace advice given to you by your health care provider. Make sure you discuss any questions you have with your health care provider. Document Released: 10/04/2010 Document Revised: 08/27/2015 Document Reviewed: 12/23/2014  2017 Elsevier   Mammogram A mammogram is an X-ray of the breasts that is done to check for abnormal changes. This procedure can screen for and detect any changes that may suggest breast cancer. A mammogram can also identify other changes and variations in the breast, such as:  Inflammation of the breast tissue (mastitis).  An infected area that contains a collection of pus (abscess).  A fluid-filled sac (cyst).  Fibrocystic changes. This is when breast tissue becomes denser, which can make the tissue feel rope-like or uneven under the skin.  Tumors that are not cancerous (benign). Tell a health care provider about:  Any allergies you have.  If you have breast implants.  If you have had previous breast disease, biopsy, or surgery.  If you are breastfeeding.  Any possibility that you could be pregnant, if this applies.  If you are younger than age 21.  If you have a family history of breast cancer. What are the risks? Generally, this is a safe procedure. However, problems may occur, including:  Exposure to radiation. Radiation levels are very low with this test.  The results being misinterpreted.  The need for further tests.  The inability of the mammogram to detect certain cancers. What happens before the procedure?  Schedule your test about 1-2 weeks after your menstrual period. This is usually when your breasts are the least tender.  If you have had a mammogram done at a different facility in the past, get the mammogram  X-rays or have them sent to your current exam facility in order to compare them.  Wash your breasts and under your arms the day of the test.  Do not wear deodorants, perfumes, lotions, or powders anywhere on your body on the day of the test.  Remove any jewelry from your neck.  Wear clothes that you can change into and out of easily. What happens during the procedure?  You will undress from the waist up and put on a gown.  You will stand in front of the X-ray machine.  Each breast will be placed between two plastic or glass plates. The plates will compress your breast for a few seconds. Try to stay as relaxed as possible during the procedure. This does not cause any harm to your breasts and any discomfort you feel will be very brief.  X-rays will be taken from different angles of each breast. The procedure may vary among health care providers and hospitals. What happens after the procedure?  The mammogram will be examined by a specialist (radiologist).  You may need to repeat certain parts of the test, depending on the quality of the images. This is commonly done if the radiologist needs a better view of the breast tissue.  Ask when your test results will be ready. Make sure you get your test results.  You may resume your normal activities. This information is not intended to replace advice given to you by your health care provider. Make sure you discuss any questions you have with your health care provider. Document Released: 03/18/2000 Document Revised: 08/24/2015 Document Reviewed: 05/30/2014 Elsevier Interactive Patient Education  2017 [employer].

## 2016-05-25 NOTE — Telephone Encounter (Signed)
Patient is still in office & will make appt.

## 2016-05-25 NOTE — Progress Notes (Signed)
I have reviewed documentation from this visit and I agree with recommendations given.  Leda Bellefeuille G. Velina Drollinger, MD  [facility]. [place] office.   

## 2016-05-25 NOTE — Telephone Encounter (Signed)
This is the 3rd request. I have not prescribed this medication and I do not usually prescribe this type of medication. Imodium OTC may help if she is taking it for diarrhea. We can discuss other options or she can request it from prescriber Marlana Salvage has PCP in [place]]   Thanks, BJ
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
## 2016-05-26 ENCOUNTER — Encounter (HOSPITAL_COMMUNITY): Payer: Medicare Other

## 2016-05-26 ENCOUNTER — Encounter: Payer: Self-pay | Admitting: Family Medicine

## 2016-05-26 ENCOUNTER — Encounter (HOSPITAL_COMMUNITY)
Admission: RE | Admit: 2016-05-26 | Discharge: 2016-05-26 | Disposition: A | Discharge status: Home / Self Care | Payer: Medicare Other | Source: Ambulatory Visit | Attending: Pulmonary Disease | Admitting: Pulmonary Disease

## 2016-05-26 ENCOUNTER — Ambulatory Visit (INDEPENDENT_AMBULATORY_CARE_PROVIDER_SITE_OTHER): Payer: Medicare Other | Admitting: Family Medicine

## 2016-05-26 VITALS — Wt 128.7 lb

## 2016-05-26 VITALS — BP 130/80 | HR 69 | Resp 12 | Ht 62.0 in | Wt 129.0 lb

## 2016-05-26 DIAGNOSIS — J439 Emphysema, unspecified: Secondary | ICD-10-CM | POA: Diagnosis not present

## 2016-05-26 DIAGNOSIS — N3281 Overactive bladder: Secondary | ICD-10-CM | POA: Diagnosis not present

## 2016-05-26 DIAGNOSIS — M545 Low back pain, unspecified: Secondary | ICD-10-CM

## 2016-05-26 DIAGNOSIS — K594 Anal spasm: Secondary | ICD-10-CM

## 2016-05-26 DIAGNOSIS — J432 Centrilobular emphysema: Secondary | ICD-10-CM

## 2016-05-26 MED ORDER — PB-HYOSCY-ATROPINE-SCOPOLAMINE 16.2 MG PO TABS: 1.0000 | ORAL_TABLET | Freq: Every day | ORAL | 1 refills | Status: DC | PRN

## 2016-05-26 MED ORDER — SOLIFENACIN SUCCINATE 10 MG PO TABS: 10.0000 mg | ORAL_TABLET | Freq: Every day | ORAL | 2 refills | Status: DC

## 2016-05-26 NOTE — Progress Notes (Signed)
Daily Session Note  Patient Details  Name: Gina Fritz MRN: [id number] Date of Birth: December 12, 1940 Referring Provider:   April Manson Pulmonary Rehab Walk Test from 03/15/2016 in [place]  Referring Provider  Dr. Halford Chessman      Encounter Date: 05/26/2016  Check In:     Session Check In - 05/26/16 1231      Check-In   Location [facility]   Staff Present Su Hilt, MS, ACSM RCEP, Exercise Physiologist;Portia Rollene Rotunda, RN, Roque Cash, RN   Supervising physician immediately available to respond to emergencies [facility] immediately available   Physician(s) Dr. Tana Coast   Medication changes reported     No   Fall or balance concerns reported    No   Warm-up and Cool-down Performed as group-led instruction   Resistance Training Performed Yes   VAD Patient? No     Pain Assessment   Currently in Pain? No/denies   Multiple Pain Sites No      Capillary Blood Glucose: No results found for this or any previous visit (from the past 24 hour(s)).      Exercise Prescription Changes - 05/26/16 1200      Response to Exercise   Blood Pressure (Admit) 104/64   Blood Pressure (Exercise) 112/80   Blood Pressure (Exit) 98/72   Heart Rate (Admit) 71 bpm   Heart Rate (Exercise) 79 bpm   Heart Rate (Exit) 70 bpm   Oxygen Saturation (Admit) 99 %   Oxygen Saturation (Exercise) 94 %   Oxygen Saturation (Exit) 99 %   Rating of Perceived Exertion (Exercise) 14   Perceived Dyspnea (Exercise) 2   Duration Progress to 45 minutes of aerobic exercise without signs/symptoms of physical distress   Intensity THRR unchanged     Progression   Progression Continue to progress workloads to maintain intensity without signs/symptoms of physical distress.     Resistance Training   Training Prescription Yes   Weight orange bands   Reps 10-12  10 minutes of strength training     Interval Training   Interval Training No     NuStep   Level 4   Minutes 17   METs 2.2     Arm Ergometer   Level 2   Minutes 17     Goals Met:  Exercise tolerated well No report of cardiac concerns or symptoms Strength training completed today  Goals Unmet:  Not Applicable  Comments: Service time is from 10:30a to 12:15p    Dr. Rush Farmer is Medical Director for Pulmonary Rehab at [facility].

## 2016-05-26 NOTE — Progress Notes (Signed)
Pre visit review using our clinic review tool, if applicable. No additional management support is needed unless otherwise documented below in the visit note. 

## 2016-05-26 NOTE — Progress Notes (Signed)
HPI:   ACUTE VISIT:  Chief Complaint  Patient presents with  . Follow-up    Gina Fritz is a 76 y.o. female, who is here today with her husband complaining of persistent lower back pain and requesting refills for some of her medications.   Back pain: Last OV she mentioned back pain, chronic, today she states that she did not have Hx of back pain until recently. 1-2 months of lower back pain.  She denies any recent injury or unusual level of activity.  Pain is not radiated, sharp and then achy like, 8/10 in intensity, with no associated LE numbness, tingling, urinary incontinence or retention, stool incontinence, or saddle anesthesia.  Exacerbated by certain activities:prolonged sitting,bednong forward, or riding on car on when driving on uneven road. Alleviated by rest and changing position. No rash or edema on area, fever, chills, or abnormal wt loss.   OTC medications: Tylenol,which does not seem to help.  No prior work-up.  -Hx of urinary frequency, requesting refills on Vesicare,whihc has helped with symptoms. She denies dysuria,gross hematuria,urine urgency,or incontinence. She denies side effects from medication.  She also has Flomax on her med list,not taking it now.She thinks it was prescribed in the past to treat kidney stones.  -She is also requesting refills on Donnatal , which was started about 45 years ago her gyn at that time.:ater it was continued by former PCP, Dr Tollie Pizza. According to pt, medication was initially started to treat rectal "contractions", she cannot explain type of pain, it is not frequent,about once per months. Starts suddenly and sometimes it resolves spontaneously. She has not identified exacerbating factors, not sure if stress makes it worse. She denies associated fecal incontinence,blood in stool, nausea,vomiting,or abdominal pain. Problem has been stable for years. She has not had colonoscopy in the past, has done annual  fecal immunochemical test.   Review of Systems  Constitutional: Positive for fatigue (No more than usual). Negative for activity change, appetite change, fever and unexpected weight change.  HENT: Negative for mouth sores, sore throat and trouble swallowing.   Respiratory: Negative for cough, shortness of breath and wheezing.   Cardiovascular: Negative for leg swelling.  Gastrointestinal: Positive for rectal pain. Negative for abdominal pain, blood in stool, nausea and vomiting.       Negative for changes in bowel habits or fecal incontinence.  Genitourinary: Positive for frequency (chronic). Negative for decreased urine volume, dysuria, flank pain, hematuria, vaginal bleeding and vaginal discharge.       Negative for urine incontinence.  Musculoskeletal: Positive for back pain. Negative for neck pain.  Skin: Negative for rash.  Neurological: Negative for syncope, weakness, numbness and headaches.  Hematological: Negative for adenopathy. Does not bruise/bleed easily.  Psychiatric/Behavioral: Negative for confusion. The patient is nervous/anxious.       Current Outpatient Prescriptions on File Prior to Visit  Medication Sig Dispense Refill  . acetaminophen (TYLENOL) 325 MG tablet Take 2 tablets (650 mg total) by mouth every 6 (six) hours as needed for mild pain (or Fever >/= 101). 30 tablet 1  . albuterol (PROAIR HFA) 108 (90 Base) MCG/ACT inhaler Inhale 2 puffs into the lungs every 6 (six) hours as needed for wheezing or shortness of breath. 1 Inhaler 2  . alendronate (FOSAMAX) 70 MG tablet Take 70 mg by mouth every Sunday.    Marland Kitchen amLODipine (NORVASC) 10 MG tablet Take 1 tablet (10 mg total) by mouth daily. (Patient taking differently: Take 5 mg by mouth daily. )  30 tablet 3  . aspirin EC 81 MG tablet Take 81 mg by mouth daily.    Marland Kitchen atorvastatin (LIPITOR) 20 MG tablet Take 1 tablet (20 mg total) by mouth daily. 90 tablet 2  . BESIVANCE 0.6 % SUSP Place 1 drop into the right eye See admin  instructions. Takes the day before and the day of the dr visit 4 times a day    . Cholecalciferol (VITAMIN D) 2000 UNITS CAPS Take 2,000 Units by mouth daily.    . Cyanocobalamin (VITAMIN B-12 PO) Take 1 tablet by mouth daily.    Marland Kitchen escitalopram (LEXAPRO) 20 MG tablet Take 20 mg by mouth daily.     . fluticasone (FLONASE) 50 MCG/ACT nasal spray Place 2 sprays into the nose daily.    Marland Kitchen gabapentin (NEURONTIN) 300 MG capsule TAKE 1 CAPSULE(300 MG) BY MOUTH THREE TIMES DAILY 90 capsule 2  . influenza vac recom quadrivalent (FLUBLOK) 0.5 ML injection Inject 0.5 mLs into the muscle once.    Marland Kitchen losartan (COZAAR) 50 MG tablet Take 1 tablet (50 mg total) by mouth 2 (two) times daily. 180 tablet 1  . mometasone-formoterol (DULERA) 100-5 MCG/ACT AERO Inhale 2 puffs into the lungs 2 (two) times daily. 1 Inhaler 5  . montelukast (SINGULAIR) 10 MG tablet Take 1 tablet (10 mg total) by mouth daily. 90 tablet 1  . Multiple Vitamin (MULTIVITAMIN WITH MINERALS) TABS Take 1 tablet by mouth daily.    . Multiple Vitamins-Minerals (ICAPS AREDS 2 PO) Take 1 tablet by mouth 2 (two) times daily.    . Niacin (VITAMIN B-3 PO) Take 1 tablet by mouth daily.    Marland Kitchen omeprazole (PRILOSEC) 20 MG capsule Take 1 capsule (20 mg total) by mouth daily. 90 capsule 1  . polyethylene glycol (MIRALAX / GLYCOLAX) packet Take 17 g by mouth daily as needed for mild constipation.    . Riboflavin (VITAMIN B-2 PO) Take 1 tablet by mouth daily.    . tamsulosin (FLOMAX) 0.4 MG CAPS capsule Take 1 capsule (0.4 mg total) by mouth daily after breakfast. 30 capsule 2   No current facility-administered medications on file prior to visit.      Past Medical History:  Diagnosis Date  . Allergic rhinitis   . Anxiety   . CAP (community acquired pneumonia)    dx 02-05-2015  . Colitis    dx 02-05-2015  . COPD with emphysema ([place])   . Depression   . Dizziness   . GERD (gastroesophageal reflux disease)   . History of chronic sinusitis   . History  of kidney stones   . Hyperlipidemia   . Hypertension   . Irritable bowel syndrome   . Loss of weight   . Pulmonary nodules    benign and stable per CT  . Rash   . Right ureteral stone   . Sensorineural hearing loss, unilateral   . Subjective tinnitus    Allergies  Allergen Reactions  . Penicillins Anaphylaxis    Has patient had a PCN reaction causing immediate rash, facial/tongue/throat swelling, SOB or lightheadedness with hypotension: Yes Has patient had a PCN reaction causing severe rash involving mucus membranes or skin necrosis: Yes Has patient had a PCN reaction that required hospitalization Yes Has patient had a PCN reaction occurring within the last 10 years: No If all of the above answers are "NO", then may proceed with Cephalosporin use.   . Sulfa Antibiotics Hives  . Soy Allergy Itching and Rash    Social History  Social History  . Marital status: Married    Spouse name: [name]  . Number of children: 3  . Years of education: college   Occupational History  .      Retired   Social History Main Topics  . Smoking status: Former Smoker    Packs/day: 1.00    Years: 25.00    Types: Cigarettes    Quit date: 02/05/2015  . Smokeless tobacco: Never Used     Comment: Thinks her smoking was more than 25 years abut is not exact   . Alcohol use 0.6 oz/week    1 Glasses of wine per week     Comment: One drink a week.  . Drug use: No  . Sexual activity: Not Asked   Other Topics Concern  . None   Social History Narrative   Patient is married [name]) and lives at home with her husband.   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   Caffeine- Six cups of coffee daily and six cups of coke cola daily.          Vitals:   05/26/16 1420  BP: 130/80  Pulse: 69  Resp: 12   Body mass index is 23.59 kg/m.   Physical Exam  Nursing note and vitals reviewed. Constitutional: She is oriented to person, place, and time. She  appears well-developed and well-nourished. No distress.  HENT:  Head: Atraumatic.  Mouth/Throat: Oropharynx is clear and moist and mucous membranes are normal.  Eyes: Conjunctivae and EOM are normal.  Cardiovascular: Normal rate and regular rhythm.   No murmur heard. DP pulses present bilateral.  Respiratory: Effort normal and breath sounds normal. No respiratory distress.  GI: Soft. She exhibits no mass. There is no hepatomegaly. There is no tenderness.  Genitourinary: Rectal exam shows external hemorrhoid. Rectal exam shows no fissure, no mass, no tenderness and anal tone normal.  Musculoskeletal: She exhibits no edema.       Thoracic back: She exhibits no bony tenderness.       Lumbar back: She exhibits no bony tenderness.  Mild scoliosis noted. No tenderness upon palpation of lumbar or low thoracic paraspinal muscles but pain elicited when sitting up.   Neurological: She is alert and oriented to person, place, and time. Coordination normal.  Reflex Scores:      Patellar reflexes are 2+ on the right side and 2+ on the left side. Stable gait assisted with cane. SLR negative bilateral.  Skin: Skin is warm. No rash noted. No erythema.  Psychiatric: She has a normal mood and affect.  Well groomed, good eye contact.      ASSESSMENT AND PLAN:   Mayvis was seen today for follow-up.  Diagnoses and all orders for this visit:  Bilateral low back pain without sciatica, unspecified chronicity  Most likely musculoskeletal and related to DDD. Some treatment options discussed. She has Hx of anxiety and takes Lexapro, she is not interested in changing to Cymbalta. She is not interested in try NSAID's, Mobic. Continue Tylenol 500 mg tid as needed. OTC Lidocaine patches may also help. Because reporting back pain as new onset, imaging was ordered. F/U as needed.   -     DG Lumbar Spine Complete; Future  Rectal sphincter spasm  Chronic and stable. Medication side effects  discussed,since she does not need med very often I agree with refilling Rx. F/U in 6-12 months.  -     belladona alk-PHENObarbital (DONNATAL) 16.2 MG  tablet; Take 1 tablet by mouth daily as needed (ibs). No more than 2 tabs per month.  Overactive bladder-urinary frequency  Stable. No changes in current management. F/U in 6-12 months.  -     solifenacin (VESICARE) 10 MG tablet; Take 1 tablet (10 mg total) by mouth daily.      -Ms.Norine Reddington advised to return or notify a doctor immediately if symptoms worsen or persist or new concerns arise.       Cristyn Crossno G. Martinique, MD  [facility]. [place] office.

## 2016-05-26 NOTE — Patient Instructions (Signed)
A few things to remember from today's visit:   Bilateral low back pain without sciatica, unspecified chronicity - Plan: DG Lumbar Spine Complete  Rectal sphincter spasm - Plan: [place] alk-PHENObarbital (DONNATAL) 16.2 MG tablet    Please be sure medication list is accurate. If a new problem present, please set up appointment sooner than planned today.

## 2016-05-27 ENCOUNTER — Ambulatory Visit (INDEPENDENT_AMBULATORY_CARE_PROVIDER_SITE_OTHER)
Admission: RE | Admit: 2016-05-27 | Discharge: 2016-05-27 | Disposition: A | Discharge status: Home / Self Care | Payer: Medicare Other | Source: Ambulatory Visit | Attending: Family Medicine | Admitting: Family Medicine

## 2016-05-27 ENCOUNTER — Ambulatory Visit (INDEPENDENT_AMBULATORY_CARE_PROVIDER_SITE_OTHER): Payer: Medicare Other

## 2016-05-27 DIAGNOSIS — Z23 Encounter for immunization: Secondary | ICD-10-CM | POA: Diagnosis not present

## 2016-05-27 DIAGNOSIS — J449 Chronic obstructive pulmonary disease, unspecified: Secondary | ICD-10-CM

## 2016-05-27 DIAGNOSIS — M545 Low back pain, unspecified: Secondary | ICD-10-CM

## 2016-05-29 ENCOUNTER — Other Ambulatory Visit: Payer: Self-pay | Admitting: Pulmonary Disease

## 2016-05-31 ENCOUNTER — Encounter (HOSPITAL_COMMUNITY)
Admission: RE | Admit: 2016-05-31 | Discharge: 2016-05-31 | Disposition: A | Discharge status: Home / Self Care | Payer: Medicare Other | Source: Ambulatory Visit | Attending: Pulmonary Disease | Admitting: Pulmonary Disease

## 2016-05-31 VITALS — Wt 132.5 lb

## 2016-05-31 DIAGNOSIS — J439 Emphysema, unspecified: Secondary | ICD-10-CM | POA: Diagnosis not present

## 2016-05-31 DIAGNOSIS — J432 Centrilobular emphysema: Secondary | ICD-10-CM

## 2016-05-31 NOTE — Progress Notes (Signed)
Daily Session Note  Patient Details  Name: Gina Fritz MRN: [id number] Date of Birth: May 17, 1940 Referring Provider:   April Manson Pul[facility] Rehab Walk Test from 03/15/2016 in [place]  Referring Provider  Dr. Halford Chessman      Encounter Date: 05/31/2016  Check In:     Session Check In - 05/31/16 1030      Check-In   Location [facility]   Staff Present Rosebud Poles, RN, BSN;Molly diVincenzo, MS, ACSM RCEP, Exercise Physiologist;Lisa Ysidro Evert, RN;Vir Whetstine Rollene Rotunda, RN, BSN   Supervising physician immediately available to respond to emergencies [facility] immediately available   Physician(s) Dr. Posey Pronto   Medication changes reported     No   Fall or balance concerns reported    No   Tobacco Cessation No Change   Warm-up and Cool-down Performed as group-led instruction   Resistance Training Performed Yes   VAD Patient? No     Pain Assessment   Currently in Pain? No/denies   Multiple Pain Sites No      Capillary Blood Glucose: No results found for this or any previous visit (from the past 24 hour(s)).      Exercise Prescription Changes - 05/31/16 1221      Response to Exercise   Blood Pressure (Admit) 120/70   Blood Pressure (Exercise) 106/60   Blood Pressure (Exit) 104/64   Heart Rate (Admit) 68 bpm   Heart Rate (Exercise) 79 bpm   Heart Rate (Exit) 62 bpm   Oxygen Saturation (Admit) 96 %   Oxygen Saturation (Exercise) 94 %   Oxygen Saturation (Exit) 96 %   Rating of Perceived Exertion (Exercise) 15   Perceived Dyspnea (Exercise) 2   Duration Progress to 45 minutes of aerobic exercise without signs/symptoms of physical distress   Intensity THRR unchanged     Progression   Progression Continue to progress workloads to maintain intensity without signs/symptoms of physical distress.     Resistance Training   Training Prescription Yes   Weight orange bands   Reps --   Time 10 Minutes     Interval Training   Interval Training No     NuStep   Level 4   Minutes 17   METs 2.2     Arm Ergometer   Level 2   Minutes 17     Track   Laps 7   Minutes 17      History  Smoking Status  . Former Smoker  . Packs/day: 1.00  . Years: 25.00  . Types: Cigarettes  . Quit date: 02/05/2015  Smokeless Tobacco  . Never Used    Comment: Thinks her smoking was more than 25 years abut is not exact     Goals Met:  Improved SOB with ADL's Using PLB without cueing & demonstrates good technique Exercise tolerated well No report of cardiac concerns or symptoms Strength training completed today  Goals Unmet:  Not Applicable  Comments: Service time is from 1030 to 1205   Dr. Rush Farmer is Medical Director for Pulmonary Rehab at [facility].

## 2016-06-01 ENCOUNTER — Telehealth: Payer: Self-pay | Admitting: Pulmonary Disease

## 2016-06-01 NOTE — Telephone Encounter (Signed)
Can give sample of breo 100, symbicort 160, or advair 250 >> whichever is available as sample until she can get dulera filled.

## 2016-06-01 NOTE — Telephone Encounter (Signed)
Duplicate message See other 2.28.18 phone note Will close

## 2016-06-01 NOTE — Telephone Encounter (Signed)
Spoke with patient's spouse Gina Fritz - pt has been out of her Gina Fritz x2 days now while they have been working with insurance about her Gina Fritz.  Gina Fritz is requiring PA.  No Gina Fritz samples in the office.  Called [employer] and spoke with Gina Fritz The PA was actually already submitted but the diagnosis of asthma was missing  Per Gina Fritz, because we did not answer when [employer] called earlier today for the additional clinical information, pt's PA has now been denied.  Gina Fritz stated that if we submit another PA form, that will be automatically sent to the appeals dept.  Checked for covered alternatives - there are none.    Called spoke with pt's spouse Gina Fritz and updated him on the above.  Gina Fritz voiced his understanding and is very grateful for our assistance.  PA form printed, filled out and faxed to [employer] Gina Fritz, in the meantime are there any alternatives in the office that pt may try while waiting on the appeals decision?  Thank you so much.

## 2016-06-02 ENCOUNTER — Encounter (HOSPITAL_COMMUNITY)
Admission: RE | Admit: 2016-06-02 | Discharge: 2016-06-02 | Disposition: A | Discharge status: Home / Self Care | Payer: Medicare Other | Source: Ambulatory Visit | Attending: Pulmonary Disease | Admitting: Pulmonary Disease

## 2016-06-02 VITALS — Wt 132.7 lb

## 2016-06-02 DIAGNOSIS — J432 Centrilobular emphysema: Secondary | ICD-10-CM

## 2016-06-02 DIAGNOSIS — J439 Emphysema, unspecified: Secondary | ICD-10-CM

## 2016-06-02 NOTE — Telephone Encounter (Signed)
Please give her sample of Breo 100 one puff daily.

## 2016-06-02 NOTE — Telephone Encounter (Signed)
Attempted to contact pt. No answer, no option to leave a message. Will try back.  

## 2016-06-02 NOTE — Telephone Encounter (Signed)
Patient husband called checking medication issue - He can be reached at [phone] -pr

## 2016-06-02 NOTE — Telephone Encounter (Signed)
We have samples of all 3 medications.  VS - please advise on your preference. Thanks.

## 2016-06-02 NOTE — Progress Notes (Signed)
Daily Session Note  Patient Details  Name: Gina Fritz MRN: [id number] Date of Birth: [birth year] Referring Provider:   April Manson [facility]monary Rehab Walk Test from 03/15/2016 in [place]  Referring Provider  Dr. Halford Chessman      Encounter Date: 06/02/2016  Check In:     Session Check In - 06/02/16 1045      Check-In   Location [facility]   Staff Present Rosebud Poles, RN, BSN;Cace Osorto, MS, ACSM RCEP, Exercise Physiologist;Lisa Ysidro Evert, RN;Portia Rollene Rotunda, RN, BSN   Supervising physician immediately available to respond to emergencies [facility] immediately available   Physician(s) Dr. Sloan Leiter   Medication changes reported     No   Fall or balance concerns reported    No   Tobacco Cessation No Change   Warm-up and Cool-down Performed as group-led instruction   Resistance Training Performed Yes   VAD Patient? No     Pain Assessment   Currently in Pain? No/denies   Multiple Pain Sites No      Capillary Blood Glucose: No results found for this or any previous visit (from the past 24 hour(s)).      Exercise Prescription Changes - 06/02/16 1200      Response to Exercise   Blood Pressure (Admit) 110/68   Blood Pressure (Exercise) 140/78   Blood Pressure (Exit) 108/70   Heart Rate (Admit) 63 bpm   Heart Rate (Exercise) 77 bpm   Heart Rate (Exit) 60 bpm   Oxygen Saturation (Admit) 97 %   Oxygen Saturation (Exercise) 92 %   Oxygen Saturation (Exit) 98 %   Rating of Perceived Exertion (Exercise) 15   Perceived Dyspnea (Exercise) 1   Duration Progress to 45 minutes of aerobic exercise without signs/symptoms of physical distress   Intensity THRR unchanged     Progression   Progression Continue to progress workloads to maintain intensity without signs/symptoms of physical distress.     Resistance Training   Training Prescription Yes   Weight orange bands   Reps 10-15   Time 10 Minutes     Interval  Training   Interval Training No     NuStep   Level 4   Minutes 17   METs 2.1     Track   Laps 6   Minutes 17      History  Smoking Status  . Former Smoker  . Packs/day: 1.00  . Years: 25.00  . Types: Cigarettes  . Quit date: 02/05/2015  Smokeless Tobacco  . Never Used    Comment: Thinks her smoking was more than 25 years abut is not exact     Goals Met:  Exercise tolerated well No report of cardiac concerns or symptoms Strength training completed today  Goals Unmet:  Not Applicable  Comments: Service time is from 10:30a to 12:20p    Dr. Rush Farmer is Medical Director for Pulmonary Rehab at [facility].

## 2016-06-03 MED ORDER — FLUTICASONE FUROATE-VILANTEROL 100-25 MCG/INH IN AEPB: 1.0000 | INHALATION_SPRAY | Freq: Every day | RESPIRATORY_TRACT | 0 refills | Status: DC

## 2016-06-03 NOTE — Telephone Encounter (Signed)
Attempted to contact pt's husband. A female answered the line and kept stating, "I'm sorry I can't hear you." Will try back later.

## 2016-06-03 NOTE — Telephone Encounter (Signed)
Spoke with pt's husband. He is aware that we will leave samples of Breo 100 at the front desk. He is aware of how that pt will use the medication. Nothing further was needed at this time.

## 2016-06-07 ENCOUNTER — Encounter (HOSPITAL_COMMUNITY)
Admission: RE | Admit: 2016-06-07 | Discharge: 2016-06-07 | Disposition: A | Discharge status: Home / Self Care | Payer: Medicare Other | Source: Ambulatory Visit | Attending: Pulmonary Disease | Admitting: Pulmonary Disease

## 2016-06-07 ENCOUNTER — Telehealth: Payer: Self-pay | Admitting: Pulmonary Disease

## 2016-06-07 VITALS — Wt 130.3 lb

## 2016-06-07 DIAGNOSIS — J439 Emphysema, unspecified: Secondary | ICD-10-CM | POA: Diagnosis not present

## 2016-06-07 DIAGNOSIS — J432 Centrilobular emphysema: Secondary | ICD-10-CM

## 2016-06-07 MED ORDER — FLUTICASONE PROPIONATE 50 MCG/ACT NA SUSP: 2.0000 | Freq: Every day | NASAL | 5 refills | Status: DC

## 2016-06-07 NOTE — Telephone Encounter (Signed)
Okay to send refill. 

## 2016-06-07 NOTE — Progress Notes (Signed)
Daily Session Note  Patient Details  Name: Gina Fritz MRN: [id number] Date of Birth: [birth year] Referring Provider:   April Manson Pulmonary Rehab Walk Test from 03/15/2016 in [place]  Referring Provider  Dr. Halford Chessman      Encounter Date: 06/07/2016  Check In:     Session Check In - 06/07/16 1030      Check-In   Location [facility]   Staff Present Rosebud Poles, RN, BSN;Molly diVincenzo, MS, ACSM RCEP, Exercise Physiologist;Lisa Ysidro Evert, RN;Fredrick Dray Rollene Rotunda, RN, BSN   Supervising physician immediately available to respond to emergencies [facility] immediately available   Physician(s) Dr. Candiss Norse   Medication changes reported     No   Fall or balance concerns reported    No   Tobacco Cessation No Change   Warm-up and Cool-down Performed as group-led instruction   Resistance Training Performed Yes   VAD Patient? No     Pain Assessment   Currently in Pain? No/denies   Multiple Pain Sites No      Capillary Blood Glucose: No results found for this or any previous visit (from the past 24 hour(s)).      Exercise Prescription Changes - 06/07/16 1218      Response to Exercise   Blood Pressure (Admit) 104/60   Blood Pressure (Exercise) 124/70   Blood Pressure (Exit) 106/64   Heart Rate (Admit) 60 bpm   Heart Rate (Exercise) 62 bpm   Heart Rate (Exit) 65 bpm   Oxygen Saturation (Admit) 97 %   Oxygen Saturation (Exercise) 94 %   Oxygen Saturation (Exit) 95 %   Rating of Perceived Exertion (Exercise) 15   Perceived Dyspnea (Exercise) 2   Duration Progress to 45 minutes of aerobic exercise without signs/symptoms of physical distress   Intensity THRR unchanged     Progression   Progression Continue to progress workloads to maintain intensity without signs/symptoms of physical distress.     Resistance Training   Training Prescription Yes   Weight orange bands   Reps 10-15   Time 10 Minutes     Interval Training    Interval Training No     NuStep   Level 4   Minutes 17   METs 1.7     Arm Ergometer   Level 2   Minutes 17     Track   Laps 5   Minutes 17      History  Smoking Status  . Former Smoker  . Packs/day: 1.00  . Years: 25.00  . Types: Cigarettes  . Quit date: 02/05/2015  Smokeless Tobacco  . Never Used    Comment: Thinks her smoking was more than 25 years abut is not exact     Goals Met:  Improved SOB with ADL's Using PLB without cueing & demonstrates good technique Exercise tolerated well No report of cardiac concerns or symptoms Strength training completed today  Goals Unmet:  RPE. Patient participated in chair yoga yesterday and c/o soreness today.  Comments: Service time is from 1030 to 1205   Dr. Rush Farmer is Medical Director for Pulmonary Rehab at [facility].

## 2016-06-07 NOTE — Telephone Encounter (Signed)
Spoke with pt's husband, [name]. States that pt needs a refill on Flonase. The original prescribing MD is now retired.  VS - please advise if you are okay with refilling this. Thanks.

## 2016-06-07 NOTE — Telephone Encounter (Signed)
Spoke with pt's husband. He is aware that we will be refilling this prescription. Rx has been sent in. Nothing further was needed.

## 2016-06-09 ENCOUNTER — Encounter (HOSPITAL_COMMUNITY)
Admission: RE | Admit: 2016-06-09 | Discharge: 2016-06-09 | Disposition: A | Discharge status: Home / Self Care | Payer: Medicare Other | Source: Ambulatory Visit | Attending: Pulmonary Disease | Admitting: Pulmonary Disease

## 2016-06-09 VITALS — Wt 132.3 lb

## 2016-06-09 DIAGNOSIS — J439 Emphysema, unspecified: Secondary | ICD-10-CM

## 2016-06-09 DIAGNOSIS — J432 Centrilobular emphysema: Secondary | ICD-10-CM

## 2016-06-09 NOTE — Progress Notes (Signed)
Daily Session Note  Patient Details  Name: Gina Fritz MRN: [id number] Date of Birth: June 02, 1940 Referring Provider:   April Manson [facility]monary Rehab Walk Test from 03/15/2016 in [place]  Referring Provider  Dr. Halford Chessman      Encounter Date: 06/09/2016  Check In:     Session Check In - 06/09/16 1030      Check-In   Location [facility]   Staff Present Rosebud Poles, RN, BSN;Chenell Lozon, MS, ACSM RCEP, Exercise Physiologist;Portia Rollene Rotunda, RN, BSN   Supervising physician immediately available to respond to emergencies [facility] immediately available   Physician(s) Dr. Tana Coast   Medication changes reported     No   Fall or balance concerns reported    No   Tobacco Cessation No Change   Warm-up and Cool-down Performed as group-led instruction   Resistance Training Performed Yes   VAD Patient? No     Pain Assessment   Currently in Pain? No/denies   Multiple Pain Sites No      Capillary Blood Glucose: No results found for this or any previous visit (from the past 24 hour(s)).      Exercise Prescription Changes - 06/09/16 1200      Response to Exercise   Blood Pressure (Admit) 124/60   Blood Pressure (Exercise) 124/70   Blood Pressure (Exit) 108/70   Heart Rate (Admit) 64 bpm   Heart Rate (Exercise) 61 bpm   Heart Rate (Exit) 59 bpm   Oxygen Saturation (Admit) 96 %   Oxygen Saturation (Exercise) 98 %   Oxygen Saturation (Exit) 97 %   Rating of Perceived Exertion (Exercise) 13   Perceived Dyspnea (Exercise) 1   Duration Progress to 45 minutes of aerobic exercise without signs/symptoms of physical distress   Intensity THRR unchanged     Progression   Progression Continue to progress workloads to maintain intensity without signs/symptoms of physical distress.     Resistance Training   Training Prescription Yes   Weight orange bands   Reps 10-15   Time 10 Minutes     Interval Training   Interval  Training No     NuStep   Level 4   Minutes 17   METs 2     Arm Ergometer   Level 2   Minutes 17      History  Smoking Status  . Former Smoker  . Packs/day: 1.00  . Years: 25.00  . Types: Cigarettes  . Quit date: 02/05/2015  Smokeless Tobacco  . Never Used    Comment: Thinks her smoking was more than 25 years abut is not exact     Goals Met:  Exercise tolerated well No report of cardiac concerns or symptoms Strength training completed today  Goals Unmet:  Not Applicable  Comments: Service time is from 10:30a to 12:40p    Dr. Rush Farmer is Medical Director for Pulmonary Rehab at [facility].

## 2016-06-14 ENCOUNTER — Encounter (HOSPITAL_COMMUNITY)
Admission: RE | Admit: 2016-06-14 | Discharge: 2016-06-14 | Disposition: A | Discharge status: Home / Self Care | Payer: Medicare Other | Source: Ambulatory Visit | Attending: Pulmonary Disease | Admitting: Pulmonary Disease

## 2016-06-14 DIAGNOSIS — J439 Emphysema, unspecified: Secondary | ICD-10-CM

## 2016-06-14 NOTE — Progress Notes (Signed)
Pulmonary Individual Treatment Plan  Patient Details  Name: Gina Fritz MRN: [id number] Date of Birth: September 11, 1940 Referring Provider:   April Manson Pulmonary Rehab Walk Test from 03/15/2016 in [place]  Referring Provider  Dr. Halford Chessman      Initial Encounter Date:  Flowsheet Row Pulmonary Rehab Walk Test from 03/15/2016 in [place]  Date  03/15/16  Referring Provider  Dr. 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  acetaminophen (TYLENOL) 325 MG tablet, Take 2 tablets (650 mg total) by mouth every 6 (six) hours as needed for mild pain (or Fever >/= 101)., Disp: 30 tablet, Rfl: 1 .  albuterol (PROAIR HFA) 108 (90 Base) MCG/ACT inhaler, Inhale 2 puffs into the lungs every 6 (six) hours as needed for wheezing or shortness of breath., Disp: 1 Inhaler, Rfl: 2 .  alendronate (FOSAMAX) 70 MG tablet, Take 70 mg by mouth every Sunday., Disp: , Rfl:  .  amLODipine (NORVASC) 10 MG tablet, Take 1 tablet (10 mg total) by mouth daily. (Patient taking differently: Take 5 mg by mouth daily. ), Disp: 30 tablet, Rfl: 3 .  aspirin EC 81 MG tablet, Take 81 mg by mouth daily., Disp: , Rfl:  .  atorvastatin (LIPITOR) 20 MG tablet, Take 1 tablet (20 mg total) by mouth daily., Disp: 90 tablet, Rfl: 2 .  [place] alk-PHENObarbital (DONNATAL) 16.2 MG tablet, Take 1 tablet by mouth daily as needed (ibs). No more than 2 tabs per month., Disp: 15 tablet, Rfl: 1 .  BESIVANCE 0.6 % SUSP, Place 1 drop into the right eye See admin instructions. Takes the day before and the day of the dr visit 4 times a day, Disp: , Rfl:  .  Cholecalciferol (VITAMIN D) 2000 UNITS CAPS, Take 2,000 Units by mouth daily., Disp: , Rfl:  .  Cyanocobalamin (VITAMIN B-12 PO), Take 1 tablet by mouth daily., Disp: , Rfl:   .  DULERA 100-5 MCG/ACT AERO, INHALE 2 PUFFS INTO THE LUNGS TWICE DAILY, Disp: 13 g, Rfl: 5 .  escitalopram (LEXAPRO) 20 MG tablet, Take 20 mg by mouth daily. , Disp: , Rfl:  .  fluticasone (FLONASE) 50 MCG/ACT nasal spray, Place 2 sprays into both nostrils daily., Disp: 16 g, Rfl: 5 .  fluticasone furoate-vilanterol (BREO ELLIPTA) 100-25 MCG/INH AEPB, Inhale 1 puff into the lungs daily., Disp: 28 each, Rfl: 0 .  gabapentin (NEURONTIN) 300 MG capsule, TAKE 1 CAPSULE(300 MG) BY MOUTH THREE TIMES DAILY, Disp: 90 capsule, Rfl: 2 .  influenza vac recom quadrivalent (FLUBLOK) 0.5 ML injection, Inject 0.5 mLs into the muscle once., Disp: , Rfl:  .  losartan (COZAAR) 50 MG tablet, Take 1 tablet (50 mg total) by mouth 2 (two) times daily., Disp: 180 tablet, Rfl: 1 .  montelukast (SINGULAIR) 10 MG tablet, Take 1 tablet (10 mg total) by mouth daily., Disp: 90 tablet, Rfl: 1 .  Multiple Vitamin (MULTIVITAMIN WITH MINERALS) TABS, Take 1 tablet by mouth daily., Disp: , Rfl:  .  Multiple Vitamins-Minerals (ICAPS AREDS 2 PO), Take 1 tablet by mouth 2 (two) times daily., Disp: , Rfl:  .  Niacin (VITAMIN B-3 PO), Take 1 tablet by mouth daily., Disp: , Rfl:  .  omeprazole (PRILOSEC) 20 MG capsule, Take 1 capsule (20 mg total) by mouth daily., Disp: 90 capsule, Rfl: 1 .  polyethylene glycol (MIRALAX / GLYCOLAX) packet, Take 17 g by mouth daily as needed for mild constipation., Disp: , Rfl:  .  Riboflavin (VITAMIN B-2 PO), Take 1 tablet by mouth daily., Disp: , Rfl:  .  solifenacin (VESICARE) 10 MG tablet, Take 1 tablet (10 mg total) by mouth daily., Disp: 90 tablet, Rfl: 2 .  tamsulosin (FLOMAX) 0.4 MG CAPS capsule, Take 1 capsule (0.4 mg total) by mouth daily after breakfast., Disp: 30 capsule, Rfl: 2  Past Medical History: Past Medical History:  Diagnosis Date  . Allergic rhinitis   . Anxiety   . CAP (community acquired pneumonia)    dx 02-05-2015  . Colitis    dx 02-05-2015  . COPD with emphysema ([place])    . Depression   . Dizziness   . GERD (gastroesophageal reflux disease)   . History of chronic sinusitis   . History of kidney stones   . Hyperlipidemia   . Hypertension   . Irritable bowel syndrome   . Loss of weight   . Pulmonary nodules    benign and stable per CT  . Rash   . Right ureteral stone   . Sensorineural hearing loss, unilateral   . Subjective tinnitus     Tobacco Use: History  Smoking Status  . Former Smoker  . Packs/day: 1.00  . Years: 25.00  . Types: Cigarettes  . Quit date: 02/05/2015  Smokeless Tobacco  . Never Used    Comment: Thinks her smoking was more than 25 years abut is not exact     Labs: Recent Review Flowsheet Data    Labs for ITP Cardiac and Pulmonary Rehab Latest Ref Rng & Units 11/17/2015 02/24/2016   Cholestrol 0 - 200 mg/dL - 200   LDLCALC 0 - 99 mg/dL - 121(H)   HDL >39.00 mg/dL - 46.10   Trlycerides 0.0 - 149.0 mg/dL - 162.0(H)   Hemoglobin A1c 4.8 - 5.6 % 4.8 -      Capillary Blood Glucose: Lab Results  Component Value Date   GLUCAP 112 (H) 02/09/2015     ADL UCSD:   Pulmonary Function Assessment:     Pulmonary Function Assessment - 03/14/16 1250      Breath   Bilateral Breath Sounds Clear   Shortness of Breath Yes;Limiting activity      Exercise Target Goals:    Exercise Program Goal: Individual exercise prescription set with THRR, safety & activity barriers. Participant demonstrates ability to understand and report RPE using BORG scale, to self-measure pulse accurately, and to acknowledge the importance of the exercise prescription.  Exercise Prescription Goal: Starting with aerobic activity 30 plus minutes a day, 3 days per week for initial exercise prescription. Provide home exercise prescription and guidelines that participant acknowledges understanding prior to discharge.  Activity Barriers & Risk Stratification:     Activity Barriers & Cardiac Risk Stratification - 03/14/16 1248      Activity Barriers  & Cardiac Risk Stratification   Activity Barriers Balance Concerns;Deconditioning      6 Minute Walk:     6 Minute Walk    Row Name 03/15/16 1550         6 Minute Walk   Phase Initial     Distance 810 feet     Walk Time 6 minutes     # of Rest Breaks 0     MPH 1.53     METS 2.15     RPE 12     Perceived Dyspnea  1  Symptoms No     Resting HR 62 bpm     Resting BP 130/72     Max Ex. HR 101 bpm     Max Ex. BP 130/80       Interval HR   Baseline HR 62     1 Minute HR 101     2 Minute HR 70     3 Minute HR 70     4 Minute HR 70     5 Minute HR 96     6 Minute HR 76     2 Minute Post HR 71     Interval Heart Rate? Yes       Interval Oxygen   Interval Oxygen? Yes     Baseline Oxygen Saturation % 95 %     Baseline Liters of Oxygen 0 L     1 Minute Oxygen Saturation % 92 %     1 Minute Liters of Oxygen 0 L     2 Minute Oxygen Saturation % 94 %     2 Minute Liters of Oxygen 0 L     3 Minute Oxygen Saturation % 94 %     3 Minute Liters of Oxygen 0 L     4 Minute Oxygen Saturation % 94 %     4 Minute Liters of Oxygen 0 L     5 Minute Oxygen Saturation % 96 %     5 Minute Liters of Oxygen 0 L     6 Minute Oxygen Saturation % 95 %     6 Minute Liters of Oxygen 0 L     2 Minute Post Oxygen Saturation % 95 %     2 Minute Post Liters of Oxygen 0 L        Oxygen Initial Assessment:     Oxygen Initial Assessment - 06/13/16 1043      Home Oxygen   Home Oxygen Device None     Program Oxygen Prescription   Program Oxygen Prescription Continuous   Liters per minute 3     Intervention   Short Term Goals To learn and exhibit compliance with exercise, home and travel O2 prescription;To learn and understand importance of monitoring SPO2 with pulse oximeter and demonstrate accurate use of the pulse oximeter.;To Learn and understand importance of maintaining oxygen saturations>88%;To learn and demonstrate proper purse lipped breathing techniques or other breathing  techniques.;To learn and demonstrate proper use of respiratory medications   Long  Term Goals Exhibits compliance with exercise, home and travel O2 prescription      Oxygen Re-Evaluation:   Oxygen Discharge (Final Oxygen Re-Evaluation):   Initial Exercise Prescription:     Initial Exercise Prescription - 03/15/16 1500      Date of Initial Exercise RX and Referring Provider   Date 03/15/16   Referring Provider Dr. Halford Chessman     NuStep   Level 2   Minutes 17   METs 1.5     Arm Ergometer   Level 1   Minutes 17   METs 1.5     Track   Laps 5   Minutes 17     Prescription Details   Frequency (times per week) 2   Duration Progress to 45 minutes of aerobic exercise without signs/symptoms of physical distress     Intensity   THRR 40-80% of Max Heartrate 58-117   Ratings of Perceived Exertion 11-13   Perceived Dyspnea 0-4     Progression   Progression Continue progressive  overload as per policy without signs/symptoms or physical distress.     Resistance Training   Training Prescription Yes   Weight orange bands   Reps 10-12      Perform Capillary Blood Glucose checks as needed.  Exercise Prescription Changes:     Exercise Prescription Changes    Row Name 03/22/16 1200 03/24/16 1218 03/29/16 1238 03/31/16 1247 04/05/16 1200     Response to Exercise   Blood Pressure (Admit) 120/68 114/64 124/64 120/66 122/70   Blood Pressure (Exercise) 130/60 136/70 118/70 120/70 144/70   Blood Pressure (Exit) 118/70 100/60 118/76 112/80 100/70   Heart Rate (Admit) 63 bpm 65 bpm 64 bpm 72 bpm 62 bpm   Heart Rate (Exercise) 69 bpm 78 bpm 72 bpm 70 bpm 71 bpm   Heart Rate (Exit) 59 bpm 66 bpm 60 bpm 63 bpm 61 bpm   Oxygen Saturation (Admit) 98 % 95 % 96 % 97 % 97 %   Oxygen Saturation (Exercise) 95 % 94 % 95 % 95 % 94 %   Oxygen Saturation (Exit) 97 % 98 % 98 % 97 % 96 %   Rating of Perceived Exertion (Exercise) _0 Perceived Dyspnea (Exercise) _1 Duration  Progress to 45 minutes of aerobic exercise without signs/symptoms of physical distress Progress to 45 minutes of aerobic exercise without signs/symptoms of physical distress Progress to 45 minutes of aerobic exercise without signs/symptoms of physical distress Progress to 45 minutes of aerobic exercise without signs/symptoms of physical distress Progress to 45 minutes of aerobic exercise without signs/symptoms of physical distress   Intensity _2      Progression   Progression Continue progressive overload as per policy without signs/symptoms or physical distress. Continue progressive overload as per policy without signs/symptoms or physical distress. Continue progressive overload as per policy without signs/symptoms or physical distress. Continue progressive overload as per policy without signs/symptoms or physical distress. Continue progressive overload as per policy without signs/symptoms or physical distress.     Resistance Training   Training Prescription _3    Weight _4    Reps 10-12 10-12 10-12 10-12 10-12     NuStep   Level _5 Minutes _6 METs 1.4 1.7 1.6 1.5 1.7     Arm Ergometer   Level 1  - _7 Minutes 17  - _8 Track   Laps _9 - 8   Minutes _10 - 17     Exercise Review   Progression  - Yes Yes  - Yes   Row Name 04/07/16 1200 04/12/16 1200 04/14/16 1200 04/19/16 1200 04/26/16 1210     Response to Exercise   Blood Pressure (Admit) 120/60 130/68 130/76 112/70 114/58   Blood Pressure (Exercise) 124/60 120/64 140/80 130/74 112/66   Blood Pressure (Exit) 106/60 102/64 112/70 110/70 112/68   Heart Rate (Admit) 65 bpm 76 bpm 70 bpm 64 bpm 71 bpm   Heart Rate (Exercise) 66 bpm 87 bpm 73 bpm 97 bpm 84 bpm   Heart Rate (Exit) 63 bpm 73 bpm 71 bpm 74 bpm 70 bpm   Oxygen Saturation (Admit) 95 %  94 % 93 % 97 % 97 %   Oxygen Saturation (  Exercise) 96 % 89 % 95 % 95 % 96 %   Oxygen Saturation (Exit) 98 % 95 % 93 % 96 % 97 %   Rating of Perceived Exertion (Exercise) _0 Perceived Dyspnea (Exercise) _1 Duration Progress to 45 minutes of aerobic exercise without signs/symptoms of physical distress Progress to 45 minutes of aerobic exercise without signs/symptoms of physical distress Progress to 45 minutes of aerobic exercise without signs/symptoms of physical distress Progress to 45 minutes of aerobic exercise without signs/symptoms of physical distress Progress to 45 minutes of aerobic exercise without signs/symptoms of physical distress   Intensity _2      Progression   Progression Continue progressive overload as per policy without signs/symptoms or physical distress. Continue progressive overload as per policy without signs/symptoms or physical distress. Continue to progress workloads to maintain intensity without signs/symptoms of physical distress. Continue to progress workloads to maintain intensity without signs/symptoms of physical distress. Continue to progress workloads to maintain intensity without signs/symptoms of physical distress.     Resistance Training   Training Prescription _3    Weight _4    Reps 10-12  10 minutes of strength training 10-12  10 minutes of strength training 10-12  10 minutes of strength training 10-12  10 minutes of strength training 10-12  10 minutes of strength training     Interval Training   Interval Training _5      NuStep   Level _6 Minutes _7 METs 1.9 2 2.6 2 1.8     Arm Ergometer   Level 1 1  - 1.5 1.5   Minutes 17 17  - 38 17     Track   Laps  - _8 Minutes  - _9 Exercise Review   Progression  -  -  - Yes Yes    Row Name 05/03/16 1200 05/10/16 1200 05/12/16 1200 05/17/16 1200 05/24/16 1200     Response to Exercise   Blood Pressure (Admit) 118/70 102/60 100/54 102/60 112/68   Blood Pressure (Exercise) 100/60 130/60 138/70 110/64 100/70   Blood Pressure (Exit) 96/60 106/64 100/64 102/70 102/72   Heart Rate (Admit) 78 bpm 77 bpm 71 bpm 74 bpm 65 bpm   Heart Rate (Exercise) 105 bpm 102 bpm 80 bpm 82 bpm 84 bpm   Heart Rate (Exit) 72 bpm 76 bpm 61 bpm 81 bpm 69 bpm   Oxygen Saturation (Admit) 94 % 98 % 97 % 96 % 95 %   Oxygen Saturation (Exercise) 94 % 94 % 96 % 96 % 96 %   Oxygen Saturation (Exit) 97 % 97 % 97 % 99 % 98 %   Rating of Perceived Exertion (Exercise) _10 Perceived Dyspnea (Exercise) _11 Duration Progress to 45 minutes of aerobic exercise without signs/symptoms of physical distress Progress to 45 minutes of aerobic exercise without signs/symptoms of physical distress Progress to 45 minutes of aerobic exercise without signs/symptoms of physical distress Progress to 45 minutes of aerobic exercise without signs/symptoms of physical distress Progress to 45 minutes of aerobic exercise without signs/symptoms of physical distress   Intensity THRR unchanged THRR unchanged THRR unchanged  THRR unchanged THRR unchanged     Progression   Progression Continue to progress workloads to maintain intensity without signs/symptoms of physical distress. Continue to progress workloads to maintain intensity without signs/symptoms of physical distress. Continue to progress workloads to maintain intensity without signs/symptoms of physical distress. Continue to progress workloads to maintain intensity without signs/symptoms of physical distress. Continue to progress workloads to maintain intensity without signs/symptoms of physical distress.     Resistance Training   Training Prescription _0    Weight _1    Reps 10-12   10 minutes of sttrength training 10-12  10 minutes of sttrength training 10-12  10 minutes of strength training 10-12  10 minutes of strength training 10-12  10 minutes of strength training     Interval Training   Interval Training _2      NuStep   Level _3 Minutes _4 METs 2 2.3 1.7 2 2.1     Arm Ergometer   Level 1._5 Minutes _6 Track   Laps 7 9  - 6 3   Minutes 17 17  - 17 17     Home Exercise Plan   Plans to continue exercise at [place] 3 additional days to program exercise sessions.  -  -  -  -     Exercise Review   Progression  - Yes  -  -  -   Row Name 05/26/16 1200 05/31/16 1221 06/02/16 1200 06/07/16 1218 06/09/16 1200     Response to Exercise   Blood Pressure (Admit) 104/64 120/70 110/68 104/60 124/60   Blood Pressure (Exercise) 112/80 106/60 140/78 124/70 124/70   Blood Pressure (Exit) 98/72 104/64 108/70 106/64 108/70   Heart Rate (Admit) 71 bpm 68 bpm 63 bpm 60 bpm 64 bpm   Heart Rate (Exercise) 79 bpm 79 bpm 77 bpm 62 bpm 61 bpm   Heart Rate (Exit) 70 bpm 62 bpm 60 bpm 65 bpm 59 bpm   Oxygen Saturation (Admit) 99 % 96 % 97 % 97 % 96 %   Oxygen Saturation (Exercise) 94 % 94 % 92 % 94 % 98 %   Oxygen Saturation (Exit) 99 % 96 % 98 % 95 % 97 %   Rating of Perceived Exertion (Exercise) _7 Perceived Dyspnea (Exercise) _8 Duration Progress to 45 minutes of aerobic exercise without signs/symptoms of physical distress Progress to 45 minutes of aerobic exercise without signs/symptoms of physical distress Progress to 45 minutes of aerobic exercise without signs/symptoms of physical distress Progress to 45 minutes of aerobic exercise without signs/symptoms of physical distress Progress to 45 minutes of aerobic exercise without signs/symptoms of physical distress   Intensity _9      Progression    Progression Continue to progress workloads to maintain intensity without signs/symptoms of physical distress. Continue to progress workloads to maintain intensity without signs/symptoms of physical distress. Continue to progress workloads to maintain intensity without signs/symptoms of physical distress. Continue to progress workloads to maintain intensity without signs/symptoms of physical distress. Continue to progress workloads to maintain intensity without signs/symptoms of physical distress.  Resistance Training   Training Prescription _0    Weight _1    Reps 10-12  10 minutes of strength training 10-15 10-15 10-15 10-15   Time  - 10 Minutes 10 Minutes 10 Minutes 10 Minutes     Interval Training   Interval Training _2      NuStep   Level _3 Minutes _4 METs 2.2 2.2 2.1 1.7 2     Arm Ergometer   Level 2 2  - 2 2   Minutes 17 17  - 51 17     Track   Laps  - _5 -   Minutes  - _6 -      Exercise Comments:     Exercise Comments    Row Name 03/22/16 0839 04/25/16 1640 05/03/16 1222 05/16/16 1254     Exercise Comments Patient will attend first day of exercise today. Will monitor.  Patient is motivated to progress in rehab. She gets excited when she notices herself improving. She is slowly progressing. No longer using assistive device to walk in rehab.  Home exercise completed Patient is up to walking 9 laps in 15 minutes. Patient needs reminding and encouraging to increase intensities and work at a higher rate. Will cont. to encourage.       Exercise Goals and Review:   Exercise Goals Re-Evaluation :     Exercise Goals Re-Evaluation    Row Name 06/14/16 0803             Exercise Goal Re-Evaluation   Exercise Goals Review Increase Physical Activity;Increase Strenth and Stamina       Comments Patient is slowly progressing in program. She came  in deconditioned so we had to start her out low and slow. Open to workload increases but states that her current exercise intensities are somewhat hard--not much room for increasing. WIill cont. to monitor and progress.        Expected Outcomes Through exercise at rehab and home patient will increase physical capacity, stength, and stamina.          Discharge Exercise Prescription (Final Exercise Prescription Changes):     Exercise Prescription Changes - 06/09/16 1200      Response to Exercise   Blood Pressure (Admit) 124/60   Blood Pressure (Exercise) 124/70   Blood Pressure (Exit) 108/70   Heart Rate (Admit) 64 bpm   Heart Rate (Exercise) 61 bpm   Heart Rate (Exit) 59 bpm   Oxygen Saturation (Admit) 96 %   Oxygen Saturation (Exercise) 98 %   Oxygen Saturation (Exit) 97 %   Rating of Perceived Exertion (Exercise) 13   Perceived Dyspnea (Exercise) 1   Duration Progress to 45 minutes of aerobic exercise without signs/symptoms of physical distress   Intensity THRR unchanged     Progression   Progression Continue to progress workloads to maintain intensity without signs/symptoms of physical distress.     Resistance Training   Training Prescription Yes   Weight orange bands   Reps 10-15   Time 10 Minutes     Interval Training   Interval Training No     NuStep   Level 4   Minutes 17   METs 2     Arm Ergometer   Level 2   Minutes 17      Nutrition:  Target Goals: Understanding of nutrition guidelines, daily intake of sodium <1540m, cholesterol <2075m calories 30% from fat and 7% or less from saturated fats, daily to have 5 or more servings of fruits and vegetables.  Biometrics:     Pre Biometrics - 03/14/16 1252      Pre Biometrics   Grip Strength 22 kg       Nutrition Therapy Plan and Nutrition Goals:     Nutrition Therapy & Goals - 05/12/16 1344      Nutrition Therapy   Diet General, Healthful     Personal Nutrition Goals   Nutrition Goal Maintain  current wt while in Pulmonary Rehab   Personal Goal #2 Increase dairy consumed to at least 1 serving/d   Personal Goal #3 Increase fruit and vegetables consumed by splitting meals into 5-6 meals/day if needed.   Personal Goal #4 Increase consumption of 100% whole grain bread, cereals, rice, and pasta.   Additional Goals? Yes   Personal Goal #5 Add nuts or nut butter as a meat substitute or snack at least 1-2 times/week     Intervention Plan   Intervention Prescribe, educate and counsel regarding individualized specific dietary modifications aiming towards targeted core components such as weight, hypertension, lipid management, diabetes, heart failure and other comorbidities.   Expected Outcomes Short Term Goal: Understand basic principles of dietary content, such as calories, fat, sodium, cholesterol and nutrients.;Long Term Goal: Adherence to prescribed nutrition plan.      Nutrition Discharge: Rate Your Plate Scores:     Nutrition Assessments - 05/12/16 1344      Rate Your Plate Scores   Pre Score 42      Nutrition Goals Re-Evaluation:   Nutrition Goals Discharge (Final Nutrition Goals Re-Evaluation):   Psychosocial: Target Goals: Acknowledge presence or absence of significant depression and/or stress, maximize coping skills, provide positive support system. Participant is able to verbalize types and ability to use techniques and skills needed for reducing stress and depression.  Initial Review & Psychosocial Screening:     Initial Psych Review & Screening - 03/14/16 1257      Initial Review   Current issues with --  none identified     Family Dynamics   Good Support System? Yes     Barriers   Psychosocial barriers to participate in program There are no identifiable barriers or psychosocial needs.     Screening Interventions   Interventions Encouraged to exercise      Quality of Life Scores:   PHQ-9: Recent Review Flowsheet Data    [facility]  [facility]/25[facility]2016 03/14/2016 10/27/2015 07/06/2015   Decreased Interest 0  0 0 0   Down, Depressed, Hopeless 0 1 0 1   PHQ - 2 Score 0 1 0 1     Interpretation of Total Score  Total Score Depression Severity:  1-4 = Minimal depression, 5-9 = Mild depression, 10-14 = Moderate depression, 15-19 = Moderately severe depression, 20-27 = Severe depression   Psychosocial Evaluation and Intervention:     Psychosocial Evaluation - 03/29/16 0815      Psychosocial Evaluation & Interventions   Interventions Encouraged to exercise with the program and follow exercise prescription      Psychosocial Re-Evaluation:     Psychosocial Re-Evaluation    [place] 03/29/16 0816 04/26/16 0826832/12/18 1615         Psychosocial Re-Evaluation   Comments No psychosocial concerns identified at this time. No psychosocial concerns identified at this time. No psychosocial concerns  identified at this time.     Interventions Encouraged to attend Pulmonary Rehabilitation for the exercise Encouraged to attend Pulmonary Rehabilitation for the exercise Encouraged to attend Pulmonary Rehabilitation for the exercise        Psychosocial Discharge (Final Psychosocial Re-Evaluation):     Psychosocial Re-Evaluation - 05/16/16 1615      Psychosocial Re-Evaluation   Comments No psychosocial concerns identified at this time.   Interventions Encouraged to attend Pulmonary Rehabilitation for the exercise      Education: Education Goals: Education classes will be provided on a weekly basis, covering required topics. Participant will state understanding/return demonstration of topics presented.  Learning Barriers/Preferences:     Learning Barriers/Preferences - 03/14/16 1248      Learning Barriers/Preferences   Learning Barriers None   Learning Preferences Written Material;Verbal Instruction;Video;Skilled Demonstration;Pictoral;Individual Instruction;Group Instruction;Audio      Education Topics: Risk Factor  Reduction:  -Group instruction that is supported by a PowerPoint presentation. Instructor discusses the definition of a risk factor, different risk factors for pulmonary disease, and how the heart and lungs work together.     Nutrition for Pulmonary Patient:  -Group instruction provided by PowerPoint slides, verbal discussion, and written materials to support subject matter. The instructor gives an explanation and review of healthy diet recommendations, which includes a discussion on weight management, recommendations for fruit and vegetable consumption, as well as protein, fluid, caffeine, fiber, sodium, sugar, and alcohol. Tips for eating when patients are short of breath are discussed. Flowsheet Row PULMONARY REHAB OTHER RESPIRATORY from 06/09/2016 in [place]  Date  04/14/16  Educator  edna  Instruction Review Code  2- meets goals/outcomes      Pursed Lip Breathing:  -Group instruction that is supported by demonstration and informational handouts. Instructor discusses the benefits of pursed lip and diaphragmatic breathing and detailed demonstration on how to preform both.   Flowsheet Row PULMONARY REHAB OTHER RESPIRATORY from 06/09/2016 in [place]  Date  03/24/16  Educator  EP  Instruction Review Code  2- meets goals/outcomes      Oxygen Safety:  -Group instruction provided by PowerPoint, verbal discussion, and written material to support subject matter. There is an overview of "What is Oxygen" and "Why do we need it".  Instructor also reviews how to create a safe environment for oxygen use, the importance of using oxygen as prescribed, and the risks of noncompliance. There is a brief discussion on traveling with oxygen and resources the patient may utilize. Flowsheet Row PULMONARY REHAB OTHER RESPIRATORY from 10/15/2015 in [place]  Date  09/17/15  Educator  RN  Instruction Review Code   2- meets goals/outcomes      Oxygen Equipment:  -Group instruction provided by [facility] Staff utilizing handouts, written materials, and equipment demonstrations. Flowsheet Row PULMONARY REHAB OTHER RESPIRATORY from 06/09/2016 in [place]  Date  04/07/16  Educator  [employer]  Instruction Review Code  2- meets goals/outcomes      Signs and Symptoms:  -Group instruction provided by written material and verbal discussion to support subject matter. Warning signs and symptoms of infection, stroke, and heart attack are reviewed and when to call the physician/911 reinforced. Tips for preventing the spread of infection discussed. Flowsheet Row PULMONARY REHAB OTHER RESPIRATORY from 10/15/2015 in [place]  Date  10/15/15  Educator  RN  Instruction Review Code  2- meets goals/outcomes  Advanced Directives:  -Group instruction provided by verbal instruction and written material to support subject matter. Instructor reviews Advanced Directive laws and proper instruction for filling out document. Flowsheet Row PULMONARY REHAB OTHER RESPIRATORY from 10/15/2015 in [place]  Date  08/20/15  Educator  Jeanella Craze  Instruction Review Code  2- meets goals/outcomes      Pulmonary Video:  -Group video education that reviews the importance of medication and oxygen compliance, exercise, good nutrition, pulmonary hygiene, and pursed lip and diaphragmatic breathing for the pulmonary patient. Flowsheet Row PULMONARY REHAB OTHER RESPIRATORY from 06/09/2016 in [place]  Date  05/27/15  Educator  staff  Instruction Review Code  2- meets goals/outcomes      Exercise for the Pulmonary Patient:  -Group instruction that is supported by a PowerPoint presentation. Instructor discusses benefits of exercise, core components of exercise, frequency, duration, and intensity of an  exercise routine, importance of utilizing pulse oximetry during exercise, safety while exercising, and options of places to exercise outside of rehab.   Flowsheet Row PULMONARY REHAB OTHER RESPIRATORY from 10/15/2015 in [place]  Date  10/08/15  Educator  EP  Instruction Review Code  2- meets goals/outcomes      Pulmonary Medications:  -Verbally interactive group education provided by instructor with focus on inhaled medications and proper administration. Flowsheet Row PULMONARY REHAB OTHER RESPIRATORY from 10/15/2015 in [place]  Date  07/16/15  Educator  RT  Instruction Review Code  2- meets goals/outcomes      Anatomy and Physiology of the Respiratory System and Intimacy:  -Group instruction provided by PowerPoint, verbal discussion, and written material to support subject matter. Instructor reviews respiratory cycle and anatomical components of the respiratory system and their functions. Instructor also reviews differences in obstructive and restrictive respiratory diseases with examples of each. Intimacy, Sex, and Sexuality differences are reviewed with a discussion on how relationships can change when diagnosed with pulmonary disease. Common sexual concerns are reviewed. Flowsheet Row PULMONARY REHAB OTHER RESPIRATORY from 06/09/2016 in [place]  Date  06/09/16  Educator  RN  Instruction Review Code  2- meets goals/outcomes      Knowledge Questionnaire Score:   Core Components/Risk Factors/Patient Goals at Admission:     Personal Goals and Risk Factors at Admission - 03/14/16 1252      Core Components/Risk Factors/Patient Goals on Admission   Increase Strength and Stamina Yes   Improve shortness of breath with ADL's Yes   Develop more efficient breathing techniques such as purse lipped breathing and diaphragmatic breathing; and practicing self-pacing with activity Yes       Core Components/Risk Factors/Patient Goals Review:      Goals and Risk Factor Review    Row Name 03/29/16 0814 03/29/16 0823 04/26/16 0842 05/16/16 1615       Core Components/Risk Factors/Patient Goals Review   Personal Goals Review Increase Strength and Stamina;Improve shortness of breath with ADL's;Develop more efficient breathing techniques such as purse lipped breathing and diaphragmatic breathing and practicing self-pacing with activity.  - Increase Strength and Stamina;Improve shortness of breath with ADL's;Develop more efficient breathing techniques such as purse lipped breathing and diaphragmatic breathing and practicing self-pacing with activity. Increase Strength and Stamina;Improve shortness of breath with ADL's;Develop more efficient breathing techniques such as purse lipped breathing and diaphragmatic breathing and practicing self-pacing with activity.    Review Patient has only attended 2  session. see comments sections on ITP see comments sections on ITP see comments sections on ITP    Expected Outcomes See admission outcomes  - See admission outcomes See admission outcomes       Core Components/Risk Factors/Patient Goals at Discharge (Final Review):      Goals and Risk Factor Review - 05/16/16 1615      Core Components/Risk Factors/Patient Goals Review   Personal Goals Review Increase Strength and Stamina;Improve shortness of breath with ADL's;Develop more efficient breathing techniques such as purse lipped breathing and diaphragmatic breathing and practicing self-pacing with activity.   Review see comments sections on ITP   Expected Outcomes See admission outcomes      ITP Comments:   Comments: ITP REVIEW Pt is making expected progress toward pulmonary rehab goals after completing 20 sessions. We do not believe she has reached her maximum exercise potential so the number of exercise session within the program will be extended for her. She tolerates workload  increases and utilizes pursed lip breathing, however she has to be reminded to maintain an upright posture when ambulating. She is attempting to do chair yoga at the [facility], however she verbalizes sore muscles after her session. Recommend continued exercise, life style modification, education, and utilization of breathing techniques to increase stamina and strength and decrease shortness of breath with exertion.

## 2016-06-16 ENCOUNTER — Encounter (HOSPITAL_COMMUNITY)
Admission: RE | Admit: 2016-06-16 | Discharge: 2016-06-16 | Disposition: A | Discharge status: Home / Self Care | Payer: Medicare Other | Source: Ambulatory Visit | Attending: Pulmonary Disease | Admitting: Pulmonary Disease

## 2016-06-16 VITALS — Wt 131.8 lb

## 2016-06-16 DIAGNOSIS — J439 Emphysema, unspecified: Secondary | ICD-10-CM | POA: Diagnosis not present

## 2016-06-16 DIAGNOSIS — J432 Centrilobular emphysema: Secondary | ICD-10-CM

## 2016-06-16 LAB — COLOGUARD: Cologuard: NEGATIVE

## 2016-06-16 NOTE — Progress Notes (Signed)
Daily Session Note  Patient Details  Name: Gina Fritz MRN: [id number] Date of Birth: [birth year] Referring Provider:     Pulmonary Rehab Walk Test from 03/15/2016 in [place]  Referring Provider  Dr. Halford Chessman      Encounter Date: 06/16/2016  Check In:     Session Check In - 06/16/16 1030      Check-In   Location [facility]   Staff Present Trish Fountain, RN, Maxcine Ham, RN, BSN;Lisa Hughes, RN;Giavanni Odonovan, MS, ACSM RCEP, Exercise Physiologist   Supervising physician immediately available to respond to emergencies [facility] immediately available   Physician(s) Dr. Sloan Leiter   Medication changes reported     No   Fall or balance concerns reported    No   Tobacco Cessation No Change   Warm-up and Cool-down Performed as group-led instruction   Resistance Training Performed Yes   VAD Patient? No     Pain Assessment   Currently in Pain? No/denies   Multiple Pain Sites No      Capillary Blood Glucose: No results found for this or any previous visit (from the past 24 hour(s)).      Exercise Prescription Changes - 06/16/16 1300      Response to Exercise   Blood Pressure (Admit) 124/68   Blood Pressure (Exercise) 116/60   Blood Pressure (Exit) 110/68   Heart Rate (Admit) 63 bpm   Heart Rate (Exercise) 63 bpm   Heart Rate (Exit) 64 bpm   Oxygen Saturation (Admit) 96 %   Oxygen Saturation (Exercise) 96 %   Oxygen Saturation (Exit) 98 %   Rating of Perceived Exertion (Exercise) 17   Perceived Dyspnea (Exercise) 2   Duration Progress to 45 minutes of aerobic exercise without signs/symptoms of physical distress   Intensity THRR unchanged     Progression   Progression Continue to progress workloads to maintain intensity without signs/symptoms of physical distress.     Resistance Training   Training Prescription Yes   Weight orange bands   Reps 10-15   Time 10 Minutes     Interval Training   Interval Training No     Arm Ergometer   Level 2   Minutes 17     Track   Laps 6   Minutes 17      History  Smoking Status  . Former Smoker  . Packs/day: 1.00  . Years: 25.00  . Types: Cigarettes  . Quit date: 02/05/2015  Smokeless Tobacco  . Never Used    Comment: Thinks her smoking was more than 25 years abut is not exact     Goals Met:  Exercise tolerated well No report of cardiac concerns or symptoms Strength training completed today  Goals Unmet:  Not Applicable  Comments: Service time is from 10:30A to 12:45P    Dr. Rush Farmer is Medical Director for Pulmonary Rehab at [facility].

## 2016-06-17 ENCOUNTER — Telehealth: Payer: Self-pay | Admitting: Pulmonary Disease

## 2016-06-17 NOTE — Telephone Encounter (Signed)
A prior authorization was initiated via covermymeds using key [id number]. The information was sent to the plan. Will check at later date for decision.    PA Key: Gina Fritz

## 2016-06-20 DIAGNOSIS — D1800 Hemangioma unspecified site: Secondary | ICD-10-CM | POA: Diagnosis not present

## 2016-06-20 DIAGNOSIS — Z87891 Personal history of nicotine dependence: Secondary | ICD-10-CM | POA: Diagnosis not present

## 2016-06-20 DIAGNOSIS — R51 Headache: Secondary | ICD-10-CM | POA: Diagnosis not present

## 2016-06-20 DIAGNOSIS — Z79899 Other long term (current) drug therapy: Secondary | ICD-10-CM | POA: Diagnosis not present

## 2016-06-20 DIAGNOSIS — R42 Dizziness and giddiness: Secondary | ICD-10-CM | POA: Diagnosis not present

## 2016-06-20 DIAGNOSIS — R112 Nausea with vomiting, unspecified: Secondary | ICD-10-CM | POA: Diagnosis not present

## 2016-06-20 DIAGNOSIS — R55 Syncope and collapse: Secondary | ICD-10-CM | POA: Diagnosis not present

## 2016-06-20 DIAGNOSIS — R410 Disorientation, unspecified: Secondary | ICD-10-CM | POA: Diagnosis not present

## 2016-06-20 DIAGNOSIS — I10 Essential (primary) hypertension: Secondary | ICD-10-CM | POA: Diagnosis not present

## 2016-06-20 DIAGNOSIS — R2681 Unsteadiness on feet: Secondary | ICD-10-CM | POA: Diagnosis not present

## 2016-06-21 ENCOUNTER — Encounter (HOSPITAL_COMMUNITY)
Admission: RE | Admit: 2016-06-21 | Discharge: 2016-06-21 | Disposition: A | Discharge status: Home / Self Care | Payer: Medicare Other | Source: Ambulatory Visit | Attending: Pulmonary Disease | Admitting: Pulmonary Disease

## 2016-06-21 VITALS — Wt 132.5 lb

## 2016-06-21 DIAGNOSIS — J432 Centrilobular emphysema: Secondary | ICD-10-CM

## 2016-06-21 DIAGNOSIS — J439 Emphysema, unspecified: Secondary | ICD-10-CM | POA: Diagnosis not present

## 2016-06-21 NOTE — Progress Notes (Signed)
Daily Session Note  Patient Details  Name: Gina Fritz MRN: [id number] Date of Birth: Sep 06, 1940 Referring Provider:     Pulmonary Rehab Walk Test from 03/15/2016 in [place]  Referring Provider  Dr. Halford Chessman      Encounter Date: 06/21/2016  Check In:     Session Check In - 06/21/16 1211      Check-In   Location [facility]   Staff Present Su Hilt, MS, ACSM RCEP, Exercise Physiologist;Joan Leonia Reeves, RN, BSN;Lisa Ysidro Evert, RN;Portia Rollene Rotunda, RN, BSN   Supervising physician immediately available to respond to emergencies [facility] immediately available   Physician(s) Dr. Candiss Norse   Medication changes reported     No   Fall or balance concerns reported    No   Tobacco Cessation No Change   Warm-up and Cool-down Performed as group-led instruction   Resistance Training Performed Yes   VAD Patient? No     Pain Assessment   Currently in Pain? No/denies   Multiple Pain Sites No      Capillary Blood Glucose: No results found for this or any previous visit (from the past 24 hour(s)).      Exercise Prescription Changes - 06/21/16 1200      Response to Exercise   Blood Pressure (Admit) 118/68   Blood Pressure (Exercise) 136/80   Blood Pressure (Exit) 108/60   Heart Rate (Admit) 67 bpm   Heart Rate (Exercise) 80 bpm   Heart Rate (Exit) 67 bpm   Oxygen Saturation (Admit) 95 %   Oxygen Saturation (Exercise) 96 %   Oxygen Saturation (Exit) 94 %   Rating of Perceived Exertion (Exercise) 15   Perceived Dyspnea (Exercise) 3   Duration Progress to 45 minutes of aerobic exercise without signs/symptoms of physical distress   Intensity THRR unchanged     Progression   Progression Continue to progress workloads to maintain intensity without signs/symptoms of physical distress.     Resistance Training   Training Prescription Yes   Weight orange bands   Reps 10-15   Time 10 Minutes     Interval Training   Interval  Training No     NuStep   Level 4   Minutes 17   METs 1.6     Arm Ergometer   Level 2   Minutes 17     Track   Laps 6   Minutes 17      History  Smoking Status  . Former Smoker  . Packs/day: 1.00  . Years: 25.00  . Types: Cigarettes  . Quit date: 02/05/2015  Smokeless Tobacco  . Never Used    Comment: Thinks her smoking was more than 25 years abut is not exact     Goals Met:  Exercise tolerated well No report of cardiac concerns or symptoms Strength training completed today  Goals Unmet:  Not Applicable  Comments: Service time is from 10:30a to 12:00p    Dr. Rush Farmer is Medical Director for Pulmonary Rehab at [facility].

## 2016-06-23 ENCOUNTER — Encounter (HOSPITAL_COMMUNITY)
Admission: RE | Admit: 2016-06-23 | Discharge: 2016-06-23 | Disposition: A | Discharge status: Home / Self Care | Payer: Medicare Other | Source: Ambulatory Visit | Attending: Pulmonary Disease | Admitting: Pulmonary Disease

## 2016-06-23 VITALS — Wt 130.1 lb

## 2016-06-23 DIAGNOSIS — J439 Emphysema, unspecified: Secondary | ICD-10-CM | POA: Diagnosis not present

## 2016-06-23 NOTE — Progress Notes (Signed)
Daily Session Note  Patient Details  Name: Gina Fritz MRN: [id number] Date of Birth: 05/05/1940 Referring Provider:     Pulmonary Rehab Walk Test from 03/15/2016 in [place]  Referring Provider  Dr. Halford Chessman      Encounter Date: 06/23/2016  Check In:     Session Check In - 06/23/16 1030      Check-In   [facility]   Staff Present Su Hilt, MS, ACSM RCEP, Exercise Physiologist;Kitt Minardi Ysidro Evert, RN;Portia Rollene Rotunda, RN, BSN   Supervising physician immediately available to respond to emergencies [facility] immediately available   Physician(s) Dr. Allyson Sabal   Medication changes reported     No   Fall or balance concerns reported    No   Tobacco Cessation No Change   Warm-up and Cool-down Performed as group-led instruction   Resistance Training Performed Yes   VAD Patient? No     Pain Assessment   Currently in Pain? No/denies   Multiple Pain Sites No      Capillary Blood Glucose: No results found for this or any previous visit (from the past 24 hour(s)).      Exercise Prescription Changes - 06/23/16 1200      Response to Exercise   Blood Pressure (Admit) 108/62   Blood Pressure (Exercise) 118/80   Blood Pressure (Exit) 114/76   Heart Rate (Admit) 61 bpm   Heart Rate (Exercise) 77 bpm   Heart Rate (Exit) 67 bpm   Oxygen Saturation (Admit) 96 %   Oxygen Saturation (Exercise) 95 %   Oxygen Saturation (Exit) 95 %   Rating of Perceived Exertion (Exercise) 15   Perceived Dyspnea (Exercise) 3   Duration Progress to 45 minutes of aerobic exercise without signs/symptoms of physical distress   Intensity THRR unchanged     Progression   Progression Continue to progress workloads to maintain intensity without signs/symptoms of physical distress.     Resistance Training   Training Prescription Yes   Weight orange bands   Reps 10-15   Time 10 Minutes     Interval Training   Interval Training No     NuStep   Level 4   Minutes 17   METs 2.3     Arm Ergometer   Level 2   Minutes 17      History  Smoking Status  . Former Smoker  . Packs/day: 1.00  . Years: 25.00  . Types: Cigarettes  . Quit date: 02/05/2015  Smokeless Tobacco  . Never Used    Comment: Thinks her smoking was more than 25 years abut is not exact     Goals Met:  Exercise tolerated well Strength training completed today  Goals Unmet:  Not Applicable  Comments: Service time is from 1030 to 1220    Dr. Rush Farmer is Medical Director for Pulmonary Rehab at [facility].

## 2016-06-24 ENCOUNTER — Encounter: Payer: Self-pay | Admitting: Family Medicine

## 2016-06-27 NOTE — Telephone Encounter (Signed)
Checked status on CMM, received a message that states: This medication was previously approved on [id number] From 06/06/16 -To 04/03/2017 . You will be able to fill a prescription for this medication at your pharmacy. If your pharmacy has questions regarding the processing of your prescription, please have them call the [facility] pharmacy help desk at [phone]   Contacted the pharmacy and they stated pt picked up this inhaler on March 11. Will close this message. Nothing further is needed

## 2016-06-28 ENCOUNTER — Encounter (HOSPITAL_COMMUNITY)
Admission: RE | Admit: 2016-06-28 | Discharge: 2016-06-28 | Disposition: A | Discharge status: Home / Self Care | Payer: Medicare Other | Source: Ambulatory Visit | Attending: Pulmonary Disease | Admitting: Pulmonary Disease

## 2016-06-28 VITALS — Wt 133.8 lb

## 2016-06-28 DIAGNOSIS — J439 Emphysema, unspecified: Secondary | ICD-10-CM | POA: Diagnosis not present

## 2016-06-28 DIAGNOSIS — J432 Centrilobular emphysema: Secondary | ICD-10-CM

## 2016-06-28 NOTE — Progress Notes (Signed)
Daily Session Note  Patient Details  Name: Gina Fritz MRN: [id number] Date of Birth: Jul 27, 1940 Referring Provider:     Pulmonary Rehab Walk Test from 03/15/2016 in [place]  Referring Provider  Dr. Halford Chessman      Encounter Date: 06/28/2016  Check In:     Session Check In - 06/28/16 1030      Check-In   [facility]   Staff Present Rosebud Poles, RN, BSN;Molly diVincenzo, MS, ACSM RCEP, Exercise Physiologist;Annedrea Rosezella [place], RN, MHA;Brittania Sudbeck Rollene Rotunda, RN, BSN   Supervising physician immediately available to respond to emergencies [facility] immediately available   Physician(s) Dr. Tana Coast   Medication changes reported     No   Fall or balance concerns reported    No   Tobacco Cessation No Change   Warm-up and Cool-down Performed as group-led instruction   Resistance Training Performed Yes   VAD Patient? No     Pain Assessment   Currently in Pain? No/denies   Multiple Pain Sites No      Capillary Blood Glucose: No results found for this or any previous visit (from the past 24 hour(s)).      Exercise Prescription Changes - 06/28/16 1242      Response to Exercise   Blood Pressure (Admit) 120/66   Blood Pressure (Exercise) 122/74   Blood Pressure (Exit) 100/62   Heart Rate (Admit) 73 bpm   Heart Rate (Exercise) 76 bpm   Heart Rate (Exit) 64 bpm   Oxygen Saturation (Admit) 97 %   Oxygen Saturation (Exercise) 97 %   Oxygen Saturation (Exit) 99 %   Rating of Perceived Exertion (Exercise) 15   Perceived Dyspnea (Exercise) 3   Duration Progress to 45 minutes of aerobic exercise without signs/symptoms of physical distress   Intensity THRR unchanged     Progression   Progression Continue to progress workloads to maintain intensity without signs/symptoms of physical distress.     Resistance Training   Training Prescription Yes   Weight orange bands   Reps 10-15   Time 10 Minutes     Interval Training    Interval Training No     NuStep   Level 4   Minutes 17   METs 1.7     Arm Ergometer   Level 2   Minutes 17     Track   Laps 6   Minutes 17      History  Smoking Status  . Former Smoker  . Packs/day: 1.00  . Years: 25.00  . Types: Cigarettes  . Quit date: 02/05/2015  Smokeless Tobacco  . Never Used    Comment: Thinks her smoking was more than 25 years abut is not exact     Goals Met:  Improved SOB with ADL's Exercise tolerated well No report of cardiac concerns or symptoms Strength training completed today  Goals Unmet:  Not Applicable  Comments: Service time is from 1030 to 1205   Dr. Rush Farmer is Medical Director for Pulmonary Rehab at [facility].

## 2016-06-30 ENCOUNTER — Encounter (HOSPITAL_COMMUNITY)
Admission: RE | Admit: 2016-06-30 | Discharge: 2016-06-30 | Disposition: A | Discharge status: Home / Self Care | Payer: Medicare Other | Source: Ambulatory Visit | Attending: Pulmonary Disease | Admitting: Pulmonary Disease

## 2016-06-30 VITALS — Wt 134.0 lb

## 2016-06-30 DIAGNOSIS — J432 Centrilobular emphysema: Secondary | ICD-10-CM

## 2016-06-30 DIAGNOSIS — J439 Emphysema, unspecified: Secondary | ICD-10-CM

## 2016-06-30 NOTE — Progress Notes (Signed)
Daily Session Note  Patient Details  Name: Gina Fritz MRN: [id number] Date of Birth: [birth year] Referring Provider:     Pulmonary Rehab Walk Test from 03/15/2016 in [place]  Referring Provider  Dr. Halford Chessman      Encounter Date: 06/30/2016  Check In:     Session Check In - 06/30/16 1241      Check-In   Location [facility]   Staff Present Rosebud Poles, RN, BSN;Molly diVincenzo, MS, ACSM RCEP, Exercise Physiologist;Emit Kuenzel Ysidro Evert, RN   Supervising physician immediately available to respond to emergencies [facility] immediately available   Physician(s) Dr. Sloan Leiter   Medication changes reported     No   Fall or balance concerns reported    No   Tobacco Cessation No Change   Warm-up and Cool-down Performed as group-led instruction   Resistance Training Performed Yes   VAD Patient? No     Pain Assessment   Currently in Pain? No/denies   Multiple Pain Sites No      Capillary Blood Glucose: No results found for this or any previous visit (from the past 24 hour(s)).      Exercise Prescription Changes - 06/30/16 1200      Response to Exercise   Blood Pressure (Admit) 120/64   Blood Pressure (Exercise) 138/80   Blood Pressure (Exit) 100/62   Heart Rate (Admit) 69 bpm   Heart Rate (Exercise) 86 bpm   Heart Rate (Exit) 79 bpm   Oxygen Saturation (Admit) 97 %   Oxygen Saturation (Exercise) 94 %   Oxygen Saturation (Exit) 97 %   Rating of Perceived Exertion (Exercise) 14   Perceived Dyspnea (Exercise) 3   Duration Progress to 45 minutes of aerobic exercise without signs/symptoms of physical distress   Intensity THRR unchanged     Progression   Progression Continue to progress workloads to maintain intensity without signs/symptoms of physical distress.     Resistance Training   Training Prescription Yes   Weight orange bands   Reps 10-15   Time 10 Minutes     Interval Training   Interval Training No     NuStep   Level 4   Minutes 17   METs 2.4     Track   Laps 5   Minutes 17      History  Smoking Status  . Former Smoker  . Packs/day: 1.00  . Years: 25.00  . Types: Cigarettes  . Quit date: 02/05/2015  Smokeless Tobacco  . Never Used    Comment: Thinks her smoking was more than 25 years abut is not exact     Goals Met:  Exercise tolerated well No report of cardiac concerns or symptoms Strength training completed today  Goals Unmet:  Not Applicable  Comments: Service time is from 1030 to 1230    Dr. Rush Farmer is Medical Director for Pulmonary Rehab at [facility].

## 2016-07-05 ENCOUNTER — Encounter (HOSPITAL_COMMUNITY)
Admission: RE | Admit: 2016-07-05 | Discharge: 2016-07-05 | Disposition: A | Discharge status: Home / Self Care | Payer: Medicare Other | Source: Ambulatory Visit | Attending: Pulmonary Disease | Admitting: Pulmonary Disease

## 2016-07-05 VITALS — Wt 132.7 lb

## 2016-07-05 DIAGNOSIS — J449 Chronic obstructive pulmonary disease, unspecified: Secondary | ICD-10-CM | POA: Diagnosis present

## 2016-07-05 DIAGNOSIS — J439 Emphysema, unspecified: Secondary | ICD-10-CM

## 2016-07-05 DIAGNOSIS — J432 Centrilobular emphysema: Secondary | ICD-10-CM

## 2016-07-05 NOTE — Progress Notes (Signed)
Daily Session Note  Patient Details  Name: Gina Fritz MRN: [id number] Date of Birth: [birth year] Referring Provider:     Pulmonary Rehab Walk Test from 03/15/2016 in [place]  Referring Provider  Dr. Halford Chessman      Encounter Date: 07/05/2016  Check In:     Session Check In - 07/05/16 1108      Check-In   Location [facility]   Staff Present Su Hilt, MS, ACSM RCEP, Exercise Physiologist;Lisa Ysidro Evert, RN;Portia [place], RN, Marga Melnick, RN, BSN;Joann Rion, RN, BSN   Supervising physician immediately available to respond to emergencies [facility] immediately available   Physician(s) Dr. Doyle Askew   Medication changes reported     No   Fall or balance concerns reported    No   Tobacco Cessation No Change   Warm-up and Cool-down Performed as group-led instruction   Resistance Training Performed Yes   VAD Patient? No     Pain Assessment   Currently in Pain? No/denies   Multiple Pain Sites No      Capillary Blood Glucose: No results found for this or any previous visit (from the past 24 hour(s)).      Exercise Prescription Changes - 07/05/16 1200      Response to Exercise   Blood Pressure (Admit) 148/78   Blood Pressure (Exercise) 112/70   Blood Pressure (Exit) 134/70   Heart Rate (Admit) 63 bpm   Heart Rate (Exercise) 82 bpm   Heart Rate (Exit) 62 bpm   Oxygen Saturation (Admit) 97 %   Oxygen Saturation (Exercise) 95 %   Oxygen Saturation (Exit) 97 %   Rating of Perceived Exertion (Exercise) 15   Perceived Dyspnea (Exercise) 3   Duration Progress to 45 minutes of aerobic exercise without signs/symptoms of physical distress   Intensity THRR unchanged     Progression   Progression Continue to progress workloads to maintain intensity without signs/symptoms of physical distress.     Resistance Training   Training Prescription Yes   Weight orange bands   Reps 10-15   Time 10 Minutes     Interval  Training   Interval Training No     NuStep   Level 4   Minutes 17   METs 1.8     Arm Ergometer   Level 2   Minutes 17     Track   Laps 8   Minutes 17      History  Smoking Status  . Former Smoker  . Packs/day: 1.00  . Years: 25.00  . Types: Cigarettes  . Quit date: 02/05/2015  Smokeless Tobacco  . Never Used    Comment: Thinks her smoking was more than 25 years abut is not exact     Goals Met:  Exercise tolerated well No report of cardiac concerns or symptoms Strength training completed today  Goals Unmet:  Not Applicable  Comments: Service time is from 10:30a to 12:00p    Dr. Rush Farmer is Medical Director for Pulmonary Rehab at [facility].

## 2016-07-07 ENCOUNTER — Encounter (HOSPITAL_COMMUNITY)
Admission: RE | Admit: 2016-07-07 | Discharge: 2016-07-07 | Disposition: A | Discharge status: Home / Self Care | Payer: Medicare Other | Source: Ambulatory Visit | Attending: Pulmonary Disease | Admitting: Pulmonary Disease

## 2016-07-07 VITALS — Wt 132.5 lb

## 2016-07-07 DIAGNOSIS — J449 Chronic obstructive pulmonary disease, unspecified: Secondary | ICD-10-CM | POA: Diagnosis not present

## 2016-07-07 DIAGNOSIS — J432 Centrilobular emphysema: Secondary | ICD-10-CM

## 2016-07-07 DIAGNOSIS — J439 Emphysema, unspecified: Secondary | ICD-10-CM

## 2016-07-07 NOTE — Progress Notes (Signed)
Daily Session Note  Patient Details  Name: Gina Fritz MRN: [id number] Date of Birth: February 20, 1941 Referring Provider:     Pulmonary Rehab Walk Test from 03/15/2016 in [place]  Referring Provider  Dr. Halford Chessman      Encounter Date: 07/07/2016  Check In:     Session Check In - 07/07/16 1054      Check-In   Location [facility]   Staff Present Su Hilt, MS, ACSM RCEP, Exercise Physiologist;Tameya Kuznia Ysidro Evert, RN;Portia Rollene Rotunda, RN, BSN   Supervising physician immediately available to respond to emergencies [facility] immediately available   Physician(s) Dr. Allyson Sabal   Medication changes reported     No   Fall or balance concerns reported    No   Tobacco Cessation No Change   Warm-up and Cool-down Performed as group-led instruction   Resistance Training Performed Yes   VAD Patient? No     Pain Assessment   Currently in Pain? No/denies   Multiple Pain Sites No      Capillary Blood Glucose: No results found for this or any previous visit (from the past 24 hour(s)).      Exercise Prescription Changes - 07/07/16 1300      Response to Exercise   Blood Pressure (Admit) 120/64   Blood Pressure (Exercise) 110/70   Blood Pressure (Exit) 110/70   Heart Rate (Admit) 60 bpm   Heart Rate (Exercise) 65 bpm   Heart Rate (Exit) 59 bpm   Oxygen Saturation (Admit) 97 %   Oxygen Saturation (Exercise) 96 %   Oxygen Saturation (Exit) 98 %   Rating of Perceived Exertion (Exercise) 15   Perceived Dyspnea (Exercise) 2   Duration Progress to 45 minutes of aerobic exercise without signs/symptoms of physical distress   Intensity THRR unchanged     Progression   Progression Continue to progress workloads to maintain intensity without signs/symptoms of physical distress.     Resistance Training   Training Prescription Yes   Weight orange bands   Reps 10-15   Time 10 Minutes     Interval Training   Interval Training No     NuStep   Level 5   Minutes 17   METs 2     Arm Ergometer   Level 4   Minutes 17     Exercise Review   Progression Yes      History  Smoking Status  . Former Smoker  . Packs/day: 1.00  . Years: 25.00  . Types: Cigarettes  . Quit date: 02/05/2015  Smokeless Tobacco  . Never Used    Comment: Thinks her smoking was more than 25 years abut is not exact     Goals Met:  Exercise tolerated well No report of cardiac concerns or symptoms Strength training completed today  Goals Unmet:  Not Applicable  Comments: Service time is from 1030 to 1240    Dr. Rush Farmer is Medical Director for Pulmonary Rehab at [facility]

## 2016-07-12 ENCOUNTER — Encounter (HOSPITAL_COMMUNITY)
Admission: RE | Admit: 2016-07-12 | Discharge: 2016-07-12 | Disposition: A | Discharge status: Home / Self Care | Payer: Medicare Other | Source: Ambulatory Visit | Attending: Pulmonary Disease | Admitting: Pulmonary Disease

## 2016-07-12 VITALS — Wt 133.6 lb

## 2016-07-12 DIAGNOSIS — J439 Emphysema, unspecified: Secondary | ICD-10-CM

## 2016-07-12 DIAGNOSIS — J449 Chronic obstructive pulmonary disease, unspecified: Secondary | ICD-10-CM | POA: Diagnosis not present

## 2016-07-12 DIAGNOSIS — J432 Centrilobular emphysema: Secondary | ICD-10-CM

## 2016-07-12 NOTE — Progress Notes (Signed)
Daily Session Note  Patient Details  Name: Gina Fritz MRN: [id number] Date of Birth: Dec 04, 1940 Referring Provider:     Pulmonary Rehab Walk Test from 03/15/2016 in [place]  Referring Provider  Dr. Halford Chessman      Encounter Date: 07/12/2016  Check In:     Session Check In - 07/12/16 1224      Check-In   Location [facility]   Staff Present Trish Fountain, RN, Maxcine Ham, RN, BSN;Molly diVincenzo, MS, ACSM RCEP, Exercise Physiologist;Aquan Kope Ysidro Evert, RN   Supervising physician immediately available to respond to emergencies [facility] immediately available   Physician(s) Dr. Ree Kida   Medication changes reported     No   Fall or balance concerns reported    No   Tobacco Cessation No Change   Warm-up and Cool-down Performed as group-led instruction   Resistance Training Performed Yes   VAD Patient? No     Pain Assessment   Currently in Pain? No/denies   Multiple Pain Sites No      Capillary Blood Glucose: No results found for this or any previous visit (from the past 24 hour(s)).      Exercise Prescription Changes - 07/12/16 1200      Response to Exercise   Blood Pressure (Admit) 130/64   Blood Pressure (Exercise) 100/62   Blood Pressure (Exit) 112/60   Heart Rate (Admit) 65 bpm   Heart Rate (Exercise) 71 bpm   Heart Rate (Exit) 62 bpm   Oxygen Saturation (Admit) 97 %   Oxygen Saturation (Exercise) 96 %   Oxygen Saturation (Exit) 99 %   Rating of Perceived Exertion (Exercise) 15   Perceived Dyspnea (Exercise) 2   Duration Progress to 45 minutes of aerobic exercise without signs/symptoms of physical distress   Intensity THRR unchanged     Progression   Progression Continue to progress workloads to maintain intensity without signs/symptoms of physical distress.     Resistance Training   Training Prescription Yes   Weight orange bands   Reps 10-15   Time 10 Minutes     Interval Training   Interval Training No     NuStep   Level 5   Minutes 17   METs 1.8     Arm Ergometer   Level 4   Minutes 17     Track   Laps 9   Minutes 17      History  Smoking Status  . Former Smoker  . Packs/day: 1.00  . Years: 25.00  . Types: Cigarettes  . Quit date: 02/05/2015  Smokeless Tobacco  . Never Used    Comment: Thinks her smoking was more than 25 years abut is not exact     Goals Met:  No report of cardiac concerns or symptoms Strength training completed today  Goals Unmet:  Not Applicable  Comments: Service time is from 1030 to 1205    Dr. Rush Farmer is Medical Director for Pulmonary Rehab at [facility].

## 2016-07-12 NOTE — Progress Notes (Signed)
Pulmonary Individual Treatment Plan  Patient Details  Name: Gina Fritz MRN: [id number] Date of Birth: 03-21-1941 Referring Provider:     Pulmonary Rehab Walk Test from 03/15/2016 in [facility]  Referring Provider  Dr. Halford Chessman      Initial Encounter Date:    Pulmonary Rehab Walk Test from 03/15/2016 in [facility]  Date  03/15/16  Referring Provider  Dr. 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  acetaminophen (TYLENOL) 325 MG tablet, Take 2 tablets (650 mg total) by mouth every 6 (six) hours as needed for mild pain (or Fever >/= 101)., Disp: 30 tablet, Rfl: 1 .  albuterol (PROAIR HFA) 108 (90 Base) MCG/ACT inhaler, Inhale 2 puffs into the lungs every 6 (six) hours as needed for wheezing or shortness of breath., Disp: 1 Inhaler, Rfl: 2 .  alendronate (FOSAMAX) 70 MG tablet, Take 70 mg by mouth every Sunday., Disp: , Rfl:  .  amLODipine (NORVASC) 10 MG tablet, Take 1 tablet (10 mg total) by mouth daily. (Patient taking differently: Take 5 mg by mouth daily. ), Disp: 30 tablet, Rfl: 3 .  aspirin EC 81 MG tablet, Take 81 mg by mouth daily., Disp: , Rfl:  .  atorvastatin (LIPITOR) 20 MG tablet, Take 1 tablet (20 mg total) by mouth daily., Disp: 90 tablet, Rfl: 2 .  [place] alk-PHENObarbital (DONNATAL) 16.2 MG tablet, Take 1 tablet by mouth daily as needed (ibs). No more than 2 tabs per month., Disp: 15 tablet, Rfl: 1 .  BESIVANCE 0.6 % SUSP, Place 1 drop into the right eye See admin instructions. Takes the day before and the day of the dr visit 4 times a day, Disp: , Rfl:  .  Cholecalciferol (VITAMIN D) 2000 UNITS CAPS, Take 2,000 Units by mouth daily., Disp: , Rfl:  .  Cyanocobalamin (VITAMIN B-12 PO), Take 1 tablet by mouth daily., Disp: , Rfl:  .  DULERA 100-5 MCG/ACT  AERO, INHALE 2 PUFFS INTO THE LUNGS TWICE DAILY, Disp: 13 g, Rfl: 5 .  escitalopram (LEXAPRO) 20 MG tablet, Take 20 mg by mouth daily. , Disp: , Rfl:  .  fluticasone (FLONASE) 50 MCG/ACT nasal spray, Place 2 sprays into both nostrils daily., Disp: 16 g, Rfl: 5 .  fluticasone furoate-vilanterol (BREO ELLIPTA) 100-25 MCG/INH AEPB, Inhale 1 puff into the lungs daily., Disp: 28 each, Rfl: 0 .  gabapentin (NEURONTIN) 300 MG capsule, TAKE 1 CAPSULE(300 MG) BY MOUTH THREE TIMES DAILY, Disp: 90 capsule, Rfl: 2 .  influenza vac recom quadrivalent (FLUBLOK) 0.5 ML injection, Inject 0.5 mLs into the muscle once., Disp: , Rfl:  .  losartan (COZAAR) 50 MG tablet, Take 1 tablet (50 mg total) by mouth 2 (two) times daily., Disp: 180 tablet, Rfl: 1 .  montelukast (SINGULAIR) 10 MG tablet, Take 1 tablet (10 mg total) by mouth daily., Disp: 90 tablet, Rfl: 1 .  Multiple Vitamin (MULTIVITAMIN WITH MINERALS) TABS, Take 1 tablet by mouth daily., Disp: , Rfl:  .  Multiple Vitamins-Minerals (ICAPS AREDS 2 PO), Take 1 tablet by mouth 2 (two) times daily., Disp: , Rfl:  .  Niacin (VITAMIN B-3 PO), Take 1 tablet by mouth daily., Disp: , Rfl:  .  omeprazole (PRILOSEC) 20 MG capsule, Take 1 capsule (20 mg total) by mouth daily., Disp: 90 capsule, Rfl: 1 .  polyethylene glycol (MIRALAX / GLYCOLAX) packet, Take 17 g by mouth daily as needed for mild constipation., Disp: , Rfl:  .  Riboflavin (VITAMIN B-2 PO), Take 1 tablet by mouth daily., Disp: , Rfl:  .  solifenacin (VESICARE) 10 MG tablet, Take 1 tablet (10 mg total) by mouth daily., Disp: 90 tablet, Rfl: 2 .  tamsulosin (FLOMAX) 0.4 MG CAPS capsule, Take 1 capsule (0.4 mg total) by mouth daily after breakfast., Disp: 30 capsule, Rfl: 2  Past Medical History: Past Medical History:  Diagnosis Date  . Allergic rhinitis   . Anxiety   . CAP (community acquired pneumonia)    dx 02-05-2015  . Colitis    dx 02-05-2015  . COPD with emphysema ([place])   . Depression   .  Dizziness   . GERD (gastroesophageal reflux disease)   . History of chronic sinusitis   . History of kidney stones   . Hyperlipidemia   . Hypertension   . Irritable bowel syndrome   . Loss of weight   . Pulmonary nodules    benign and stable per CT  . Rash   . Right ureteral stone   . Sensorineural hearing loss, unilateral   . Subjective tinnitus     Tobacco Use: History  Smoking Status  . Former Smoker  . Packs/day: 1.00  . Years: 25.00  . Types: Cigarettes  . Quit date: 02/05/2015  Smokeless Tobacco  . Never Used    Comment: Thinks her smoking was more than 25 years abut is not exact     Labs: Recent Review Flowsheet Data    Labs for ITP Cardiac and Pulmonary Rehab Latest Ref Rng & Units 11/17/2015 02/24/2016   Cholestrol 0 - 200 mg/dL - 200   LDLCALC 0 - 99 mg/dL - 121(H)   HDL >39.00 mg/dL - 46.10   Trlycerides 0.0 - 149.0 mg/dL - 162.0(H)   Hemoglobin A1c 4.8 - 5.6 % 4.8 -      Capillary Blood Glucose: Lab Results  Component Value Date   GLUCAP 112 (H) 02/09/2015     ADL UCSD:   Pulmonary Function Assessment:     Pulmonary Function Assessment - 03/14/16 1250      Breath   Bilateral Breath Sounds Clear   Shortness of Breath Yes;Limiting activity      Exercise Target Goals:    Exercise Program Goal: Individual exercise prescription set with THRR, safety & activity barriers. Participant demonstrates ability to understand and report RPE using BORG scale, to self-measure pulse accurately, and to acknowledge the importance of the exercise prescription.  Exercise Prescription Goal: Starting with aerobic activity 30 plus minutes a day, 3 days per week for initial exercise prescription. Provide home exercise prescription and guidelines that participant acknowledges understanding prior to discharge.  Activity Barriers & Risk Stratification:     Activity Barriers & Cardiac Risk Stratification - 03/14/16 1248      Activity Barriers & Cardiac Risk  Stratification   Activity Barriers Balance Concerns;Deconditioning      6 Minute Walk:     6 Minute Walk    Row Name 03/15/16 1550         6 Minute Walk   Phase Initial     Distance 810 feet     Walk Time 6 minutes     # of Rest Breaks 0     MPH 1.53     METS 2.15     RPE 12     Perceived Dyspnea  1  Symptoms No     Resting HR 62 bpm     Resting BP 130/72     Max Ex. HR 101 bpm     Max Ex. BP 130/80       Interval HR   Baseline HR 62     1 Minute HR 101     2 Minute HR 70     3 Minute HR 70     4 Minute HR 70     5 Minute HR 96     6 Minute HR 76     2 Minute Post HR 71     Interval Heart Rate? Yes       Interval Oxygen   Interval Oxygen? Yes     Baseline Oxygen Saturation % 95 %     Baseline Liters of Oxygen 0 L     1 Minute Oxygen Saturation % 92 %     1 Minute Liters of Oxygen 0 L     2 Minute Oxygen Saturation % 94 %     2 Minute Liters of Oxygen 0 L     3 Minute Oxygen Saturation % 94 %     3 Minute Liters of Oxygen 0 L     4 Minute Oxygen Saturation % 94 %     4 Minute Liters of Oxygen 0 L     5 Minute Oxygen Saturation % 96 %     5 Minute Liters of Oxygen 0 L     6 Minute Oxygen Saturation % 95 %     6 Minute Liters of Oxygen 0 L     2 Minute Post Oxygen Saturation % 95 %     2 Minute Post Liters of Oxygen 0 L        Oxygen Initial Assessment:     Oxygen Initial Assessment - 06/13/16 1043      Home Oxygen   Home Oxygen Device None     Program Oxygen Prescription   Program Oxygen Prescription Continuous   Liters per minute 3     Intervention   Short Term Goals To learn and exhibit compliance with exercise, home and travel O2 prescription;To learn and understand importance of monitoring SPO2 with pulse oximeter and demonstrate accurate use of the pulse oximeter.;To Learn and understand importance of maintaining oxygen saturations>88%;To learn and demonstrate proper purse lipped breathing techniques or other breathing techniques.;To  learn and demonstrate proper use of respiratory medications   Long  Term Goals Exhibits compliance with exercise, home and travel O2 prescription      Oxygen Re-Evaluation:     Oxygen Re-Evaluation    Row Name 07/11/16 1622             Program Oxygen Prescription   Program Oxygen Prescription None         Home Oxygen   Home Oxygen Device None       Sleep Oxygen Prescription None       Home Exercise Oxygen Prescription None       Home at Rest Exercise Oxygen Prescription None          Oxygen Discharge (Final Oxygen Re-Evaluation):     Oxygen Re-Evaluation - 07/11/16 1622      Program Oxygen Prescription   Program Oxygen Prescription None     Home Oxygen   Home Oxygen Device None   Sleep Oxygen Prescription None   Home Exercise Oxygen Prescription None   Home at Rest Exercise Oxygen Prescription None  Initial Exercise Prescription:     Initial Exercise Prescription - 03/15/16 1500      Date of Initial Exercise RX and Referring Provider   Date 03/15/16   Referring Provider Dr. Halford Chessman     NuStep   Level 2   Minutes 17   METs 1.5     Arm Ergometer   Level 1   Minutes 17   METs 1.5     Track   Laps 5   Minutes 17     Prescription Details   Frequency (times per week) 2   Duration Progress to 45 minutes of aerobic exercise without signs/symptoms of physical distress     Intensity   THRR 40-80% of Max Heartrate 58-117   Ratings of Perceived Exertion 11-13   Perceived Dyspnea 0-4     Progression   Progression Continue progressive overload as per policy without signs/symptoms or physical distress.     Resistance Training   Training Prescription Yes   Weight orange bands   Reps 10-12      Perform Capillary Blood Glucose checks as needed.  Exercise Prescription Changes:     Exercise Prescription Changes    Row Name 03/22/16 1200 03/24/16 1218 03/29/16 1238 03/31/16 1247 04/05/16 1200     Response to Exercise   Blood Pressure (Admit)  120/68 114/64 124/64 120/66 122/70   Blood Pressure (Exercise) 130/60 136/70 118/70 120/70 144/70   Blood Pressure (Exit) 118/70 100/60 118/76 112/80 100/70   Heart Rate (Admit) 63 bpm 65 bpm 64 bpm 72 bpm 62 bpm   Heart Rate (Exercise) 69 bpm 78 bpm 72 bpm 70 bpm 71 bpm   Heart Rate (Exit) 59 bpm 66 bpm 60 bpm 63 bpm 61 bpm   Oxygen Saturation (Admit) 98 % 95 % 96 % 97 % 97 %   Oxygen Saturation (Exercise) 95 % 94 % 95 % 95 % 94 %   Oxygen Saturation (Exit) 97 % 98 % 98 % 97 % 96 %   Rating of Perceived Exertion (Exercise) 15 13 17 13 14    Perceived Dyspnea (Exercise) 2 2 2 1 2    Duration Progress to 45 minutes of aerobic exercise without signs/symptoms of physical distress Progress to 45 minutes of aerobic exercise without signs/symptoms of physical distress Progress to 45 minutes of aerobic exercise without signs/symptoms of physical distress Progress to 45 minutes of aerobic exercise without signs/symptoms of physical distress Progress to 45 minutes of aerobic exercise without signs/symptoms of physical distress   Intensity THRR unchanged THRR unchanged THRR unchanged THRR unchanged THRR unchanged     Progression   Progression Continue progressive overload as per policy without signs/symptoms or physical distress. Continue progressive overload as per policy without signs/symptoms or physical distress. Continue progressive overload as per policy without signs/symptoms or physical distress. Continue progressive overload as per policy without signs/symptoms or physical distress. Continue progressive overload as per policy without signs/symptoms or physical distress.     Resistance Training   Training Prescription Yes Yes Yes Yes Yes   Weight orange bands orange bands orange bands orange bands orange bands   Reps 10-12 10-12 10-12 10-12 10-12     NuStep   Level 1 2 3 3 4    Minutes 17 17 17 17 17    METs 1.4 1.7 1.6 1.5 1.7     Arm Ergometer   Level 1  - 1 1 1    Minutes 17  - 17 17 17       Track  Laps 9 6 7   - 8   Minutes 17 17 17   - 17     Exercise Review   Progression  - Yes Yes  - Yes   Row Name 04/07/16 1200 04/12/16 1200 04/14/16 1200 04/19/16 1200 04/26/16 1210     Response to Exercise   Blood Pressure (Admit) 120/60 130/68 130/76 112/70 114/58   Blood Pressure (Exercise) 124/60 120/64 140/80 130/74 112/66   Blood Pressure (Exit) 106/60 102/64 112/70 110/70 112/68   Heart Rate (Admit) 65 bpm 76 bpm 70 bpm 64 bpm 71 bpm   Heart Rate (Exercise) 66 bpm 87 bpm 73 bpm 97 bpm 84 bpm   Heart Rate (Exit) 63 bpm 73 bpm 71 bpm 74 bpm 70 bpm   Oxygen Saturation (Admit) 95 % 94 % 93 % 97 % 97 %   Oxygen Saturation (Exercise) 96 % 89 % 95 % 95 % 96 %   Oxygen Saturation (Exit) 98 % 95 % 93 % 96 % 97 %   Rating of Perceived Exertion (Exercise) 13 15 13 15 15    Perceived Dyspnea (Exercise) 2 2 2 2 2    Duration Progress to 45 minutes of aerobic exercise without signs/symptoms of physical distress Progress to 45 minutes of aerobic exercise without signs/symptoms of physical distress Progress to 45 minutes of aerobic exercise without signs/symptoms of physical distress Progress to 45 minutes of aerobic exercise without signs/symptoms of physical distress Progress to 45 minutes of aerobic exercise without signs/symptoms of physical distress   Intensity THRR unchanged THRR unchanged THRR unchanged THRR unchanged THRR unchanged     Progression   Progression Continue progressive overload as per policy without signs/symptoms or physical distress. Continue progressive overload as per policy without signs/symptoms or physical distress. Continue to progress workloads to maintain intensity without signs/symptoms of physical distress. Continue to progress workloads to maintain intensity without signs/symptoms of physical distress. Continue to progress workloads to maintain intensity without signs/symptoms of physical distress.     Resistance Training   Training Prescription Yes Yes Yes Yes  Yes   Weight orange bands orange bands orange bands orange bands orange bands   Reps 10-12  10 minutes of strength training 10-12  10 minutes of strength training 10-12  10 minutes of strength training 10-12  10 minutes of strength training 10-12  10 minutes of strength training     Interval Training   Interval Training No No No No No     NuStep   Level 4 4 4 4 4    Minutes 17 17 17 17 17    METs 1.9 2 2.6 2 1.8     Arm Ergometer   Level 1 1  - 1.5 1.5   Minutes 17 17  - 17 17     Track   Laps  - 6 6 7 7    Minutes  - 17 17 17 17      Exercise Review   Progression  -  -  - Yes Yes   Row Name 05/03/16 1200 05/10/16 1200 05/12/16 1200 05/17/16 1200 05/24/16 1200     Response to Exercise   Blood Pressure (Admit) 118/70 102/60 100/54 102/60 112/68   Blood Pressure (Exercise) 100/60 130/60 138/70 110/64 100/70   Blood Pressure (Exit) 96/60 106/64 100/64 102/70 102/72   Heart Rate (Admit) 78 bpm 77 bpm 71 bpm 74 bpm 65 bpm   Heart Rate (Exercise) 105 bpm 102 bpm 80 bpm 82 bpm 84 bpm   Heart Rate (Exit) 72 bpm  76 bpm 61 bpm 81 bpm 69 bpm   Oxygen Saturation (Admit) 94 % 98 % 97 % 96 % 95 %   Oxygen Saturation (Exercise) 94 % 94 % 96 % 96 % 96 %   Oxygen Saturation (Exit) 97 % 97 % 97 % 99 % 98 %   Rating of Perceived Exertion (Exercise) 15 13 13 14 14    Perceived Dyspnea (Exercise) 1 2 2 3 3    Duration Progress to 45 minutes of aerobic exercise without signs/symptoms of physical distress Progress to 45 minutes of aerobic exercise without signs/symptoms of physical distress Progress to 45 minutes of aerobic exercise without signs/symptoms of physical distress Progress to 45 minutes of aerobic exercise without signs/symptoms of physical distress Progress to 45 minutes of aerobic exercise without signs/symptoms of physical distress   Intensity THRR unchanged THRR unchanged THRR unchanged THRR unchanged THRR unchanged     Progression   Progression Continue to progress workloads to  maintain intensity without signs/symptoms of physical distress. Continue to progress workloads to maintain intensity without signs/symptoms of physical distress. Continue to progress workloads to maintain intensity without signs/symptoms of physical distress. Continue to progress workloads to maintain intensity without signs/symptoms of physical distress. Continue to progress workloads to maintain intensity without signs/symptoms of physical distress.     Resistance Training   Training Prescription Yes Yes Yes Yes Yes   Weight orange bands orange bands orange bands orange bands orange bands   Reps 10-12  10 minutes of sttrength training 10-12  10 minutes of sttrength training 10-12  10 minutes of strength training 10-12  10 minutes of strength training 10-12  10 minutes of strength training     Interval Training   Interval Training No No No No No     NuStep   Level 4 4 4 4 4    Minutes 17 17 17 17 17    METs 2 2.3 1.7 2 2.1     Arm Ergometer   Level 1.5 2 2 2 2    Minutes 17 17 17 17 17      Track   Laps 7 9  - 6 3   Minutes 17 17  - 17 17     Home Exercise Plan   Plans to continue exercise at Home  -  -  -  -   Frequency Add 3 additional days to program exercise sessions.  -  -  -  -     Exercise Review   Progression  - Yes  -  -  -   Row Name 05/26/16 1200 05/31/16 1221 06/02/16 1200 06/07/16 1218 06/09/16 1200     Response to Exercise   Blood Pressure (Admit) 104/64 120/70 110/68 104/60 124/60   Blood Pressure (Exercise) 112/80 106/60 140/78 124/70 124/70   Blood Pressure (Exit) 98/72 104/64 108/70 106/64 108/70   Heart Rate (Admit) 71 bpm 68 bpm 63 bpm 60 bpm 64 bpm   Heart Rate (Exercise) 79 bpm 79 bpm 77 bpm 62 bpm 61 bpm   Heart Rate (Exit) 70 bpm 62 bpm 60 bpm 65 bpm 59 bpm   Oxygen Saturation (Admit) 99 % 96 % 97 % 97 % 96 %   Oxygen Saturation (Exercise) 94 % 94 % 92 % 94 % 98 %   Oxygen Saturation (Exit) 99 % 96 % 98 % 95 % 97 %   Rating of Perceived Exertion  (Exercise) 14 15 15 15 13    Perceived Dyspnea (Exercise) 2 2 1 2  1  Duration Progress to 45 minutes of aerobic exercise without signs/symptoms of physical distress Progress to 45 minutes of aerobic exercise without signs/symptoms of physical distress Progress to 45 minutes of aerobic exercise without signs/symptoms of physical distress Progress to 45 minutes of aerobic exercise without signs/symptoms of physical distress Progress to 45 minutes of aerobic exercise without signs/symptoms of physical distress   Intensity THRR unchanged THRR unchanged THRR unchanged THRR unchanged THRR unchanged     Progression   Progression Continue to progress workloads to maintain intensity without signs/symptoms of physical distress. Continue to progress workloads to maintain intensity without signs/symptoms of physical distress. Continue to progress workloads to maintain intensity without signs/symptoms of physical distress. Continue to progress workloads to maintain intensity without signs/symptoms of physical distress. Continue to progress workloads to maintain intensity without signs/symptoms of physical distress.     Resistance Training   Training Prescription Yes Yes Yes Yes Yes   Weight orange bands orange bands orange bands orange bands orange bands   Reps 10-12  10 minutes of strength training 10-15 10-15 10-15 10-15   Time  - 10 Minutes 10 Minutes 10 Minutes 10 Minutes     Interval Training   Interval Training No No No No No     NuStep   Level 4 4 4 4 4    Minutes 17 17 17 17 17    METs 2.2 2.2 2.1 1.7 2     Arm Ergometer   Level 2 2  - 2 2   Minutes 17 17  - 17 17     Track   Laps  - 7 6 5   -   Minutes  - 17 17 17   -   Row Name 06/16/16 1300 06/21/16 1200 06/23/16 1200 06/28/16 1242 06/30/16 1200     Response to Exercise   Blood Pressure (Admit) 124/68 118/68 108/62 120/66 120/64   Blood Pressure (Exercise) 116/60 136/80 118/80 122/74 138/80   Blood Pressure (Exit) 110/68 108/60 114/76  100/62 100/62   Heart Rate (Admit) 63 bpm 67 bpm 61 bpm 73 bpm 69 bpm   Heart Rate (Exercise) 63 bpm 80 bpm 77 bpm 76 bpm 86 bpm   Heart Rate (Exit) 64 bpm 67 bpm 67 bpm 64 bpm 79 bpm   Oxygen Saturation (Admit) 96 % 95 % 96 % 97 % 97 %   Oxygen Saturation (Exercise) 96 % 96 % 95 % 97 % 94 %   Oxygen Saturation (Exit) 98 % 94 % 95 % 99 % 97 %   Rating of Perceived Exertion (Exercise) 17 15 15 15 14    Perceived Dyspnea (Exercise) 2 3 3 3 3    Duration Progress to 45 minutes of aerobic exercise without signs/symptoms of physical distress Progress to 45 minutes of aerobic exercise without signs/symptoms of physical distress Progress to 45 minutes of aerobic exercise without signs/symptoms of physical distress Progress to 45 minutes of aerobic exercise without signs/symptoms of physical distress Progress to 45 minutes of aerobic exercise without signs/symptoms of physical distress   Intensity THRR unchanged THRR unchanged THRR unchanged THRR unchanged THRR unchanged     Progression   Progression Continue to progress workloads to maintain intensity without signs/symptoms of physical distress. Continue to progress workloads to maintain intensity without signs/symptoms of physical distress. Continue to progress workloads to maintain intensity without signs/symptoms of physical distress. Continue to progress workloads to maintain intensity without signs/symptoms of physical distress. Continue to progress workloads to maintain intensity without signs/symptoms of physical distress.  Resistance Training   Training Prescription Yes Yes Yes Yes Yes   Weight orange bands orange bands orange bands orange bands orange bands   Reps 10-15 10-15 10-15 10-15 10-15   Time 10 Minutes 10 Minutes 10 Minutes 10 Minutes 10 Minutes     Interval Training   Interval Training No No No No No     NuStep   Level  - 4 4 4 4    Minutes  - 17 17 17 17    METs  - 1.6 2.3 1.7 2.4     Arm Ergometer   Level 2 2 2 2   -    Minutes 17 17 17 17   -     Track   Laps 6 6  - 6 5   Minutes 17 17  - 17 70   Row Name 07/05/16 1200 07/07/16 1300           Response to Exercise   Blood Pressure (Admit) 148/78 120/64      Blood Pressure (Exercise) 112/70 110/70      Blood Pressure (Exit) 134/70 110/70      Heart Rate (Admit) 63 bpm 60 bpm      Heart Rate (Exercise) 82 bpm 65 bpm      Heart Rate (Exit) 62 bpm 59 bpm      Oxygen Saturation (Admit) 97 % 97 %      Oxygen Saturation (Exercise) 95 % 96 %      Oxygen Saturation (Exit) 97 % 98 %      Rating of Perceived Exertion (Exercise) 15 15      Perceived Dyspnea (Exercise) 3 2      Duration Progress to 45 minutes of aerobic exercise without signs/symptoms of physical distress Progress to 45 minutes of aerobic exercise without signs/symptoms of physical distress      Intensity THRR unchanged THRR unchanged        Progression   Progression Continue to progress workloads to maintain intensity without signs/symptoms of physical distress. Continue to progress workloads to maintain intensity without signs/symptoms of physical distress.        Resistance Training   Training Prescription Yes Yes      Weight orange bands orange bands      Reps 10-15 10-15      Time 10 Minutes 10 Minutes        Interval Training   Interval Training No No        NuStep   Level 4 5      Minutes 17 17      METs 1.8 2        Arm Ergometer   Level 2 4      Minutes 17 17        Track   Laps 8  -      Minutes 17  -        Exercise Review   Progression  - Yes         Exercise Comments:     Exercise Comments    Row Name 03/22/16 0839 04/25/16 1640 05/03/16 1222 05/16/16 1254     Exercise Comments Patient will attend first day of exercise today. Will monitor.  Patient is motivated to progress in rehab. She gets excited when she notices herself improving. She is slowly progressing. No longer using assistive device to walk in rehab.  Home exercise completed Patient is up to  walking 9 laps in 15 minutes. Patient needs reminding and encouraging to increase  intensities and work at a higher rate. Will cont. to encourage.       Exercise Goals and Review:   Exercise Goals Re-Evaluation :     Exercise Goals Re-Evaluation    Row Name 06/14/16 0803 07/11/16 1613           Exercise Goal Re-Evaluation   Exercise Goals Review Increase Physical Activity;Increase Strenth and Stamina Increase Physical Activity;Increase Strenth and Stamina      Comments Patient is slowly progressing in program. She came in deconditioned so we had to start her out low and slow. Open to workload increases but states that her current exercise intensities are somewhat hard--not much room for increasing. WIill cont. to monitor and progress.  Patient is slowly progressing in Pulmonary Rehab. She is still maintaining her low and slow progression. Some days she feels better and more motivated than others. Open to workload increases but states that her current exercise intensities are "somewhat hard" which doesnt leave much room for an increase. Will cont. to monitor and reinforce the importance of home exercise.       Expected Outcomes Through exercise at rehab and home patient will increase physical capacity, stength, and stamina. Through exercise at rehab and home patient will increase physical capacity, stength, and stamina.         Discharge Exercise Prescription (Final Exercise Prescription Changes):     Exercise Prescription Changes - 07/07/16 1300      Response to Exercise   Blood Pressure (Admit) 120/64   Blood Pressure (Exercise) 110/70   Blood Pressure (Exit) 110/70   Heart Rate (Admit) 60 bpm   Heart Rate (Exercise) 65 bpm   Heart Rate (Exit) 59 bpm   Oxygen Saturation (Admit) 97 %   Oxygen Saturation (Exercise) 96 %   Oxygen Saturation (Exit) 98 %   Rating of Perceived Exertion (Exercise) 15   Perceived Dyspnea (Exercise) 2   Duration Progress to 45 minutes of aerobic  exercise without signs/symptoms of physical distress   Intensity THRR unchanged     Progression   Progression Continue to progress workloads to maintain intensity without signs/symptoms of physical distress.     Resistance Training   Training Prescription Yes   Weight orange bands   Reps 10-15   Time 10 Minutes     Interval Training   Interval Training No     NuStep   Level 5   Minutes 17   METs 2     Arm Ergometer   Level 4   Minutes 17     Exercise Review   Progression Yes      Nutrition:  Target Goals: Understanding of nutrition guidelines, daily intake of sodium <1580m, cholesterol <2071m calories 30% from fat and 7% or less from saturated fats, daily to have 5 or more servings of fruits and vegetables.  Biometrics:     Pre Biometrics - 03/14/16 1252      Pre Biometrics   Grip Strength 22 kg       Nutrition Therapy Plan and Nutrition Goals:     Nutrition Therapy & Goals - 05/12/16 1344      Nutrition Therapy   Diet General, Healthful     Personal Nutrition Goals   Nutrition Goal Maintain current wt while in Pulmonary Rehab   Personal Goal #2 Increase dairy consumed to at least 1 serving/d   Personal Goal #3 Increase fruit and vegetables consumed by splitting meals into 5-6 meals/day if needed.   Personal Goal #4 Increase  consumption of 100% whole grain bread, cereals, rice, and pasta.   Additional Goals? Yes   Personal Goal #5 Add nuts or nut butter as a meat substitute or snack at least 1-2 times/week     Intervention Plan   Intervention Prescribe, educate and counsel regarding individualized specific dietary modifications aiming towards targeted core components such as weight, hypertension, lipid management, diabetes, heart failure and other comorbidities.   Expected Outcomes Short Term Goal: Understand basic principles of dietary content, such as calories, fat, sodium, cholesterol and nutrients.;Long Term Goal: Adherence to prescribed nutrition  plan.      Nutrition Discharge: Rate Your Plate Scores:     Nutrition Assessments - 05/12/16 1344      Rate Your Plate Scores   Pre Score 42      Nutrition Goals Re-Evaluation:   Nutrition Goals Discharge (Final Nutrition Goals Re-Evaluation):   Psychosocial: Target Goals: Acknowledge presence or absence of significant depression and/or stress, maximize coping skills, provide positive support system. Participant is able to verbalize types and ability to use techniques and skills needed for reducing stress and depression.  Initial Review & Psychosocial Screening:     Initial Psych Review & Screening - 03/14/16 1257      Initial Review   Current issues with --  none identified     Family Dynamics   Good Support System? Yes     Barriers   Psychosocial barriers to participate in program There are no identifiable barriers or psychosocial needs.     Screening Interventions   Interventions Encouraged to exercise      Quality of Life Scores:   PHQ-9: Recent Review Flowsheet Data    Depression screen [facility] 2/9 05/25/2016 03/14/2016 10/27/2015 07/06/2015   Decreased Interest 0  0 0 0   Down, Depressed, Hopeless 0 1 0 1   PHQ - 2 Score 0 1 0 1     Interpretation of Total Score  Total Score Depression Severity:  1-4 = Minimal depression, 5-9 = Mild depression, 10-14 = Moderate depression, 15-19 = Moderately severe depression, 20-27 = Severe depression   Psychosocial Evaluation and Intervention:     Psychosocial Evaluation - 03/29/16 0815      Psychosocial Evaluation & Interventions   Interventions Encouraged to exercise with the program and follow exercise prescription      Psychosocial Re-Evaluation:     Psychosocial Re-Evaluation    [place] Name 03/29/16 3614 04/26/16 4315 05/16/16 1615 07/11/16 1626       Psychosocial Re-Evaluation   Current issues with  -  -  - None Identified    Comments No psychosocial concerns identified at this time. No psychosocial  concerns identified at this time. No psychosocial concerns identified at this time. No psychosocial concerns identified at this time.    Interventions Encouraged to attend Pulmonary Rehabilitation for the exercise Encouraged to attend Pulmonary Rehabilitation for the exercise Encouraged to attend Pulmonary Rehabilitation for the exercise Encouraged to attend Pulmonary Rehabilitation for the exercise    Continue Psychosocial Services   -  -  - No Follow up required       Psychosocial Discharge (Final Psychosocial Re-Evaluation):     Psychosocial Re-Evaluation - 07/11/16 1626      Psychosocial Re-Evaluation   Current issues with None Identified   Comments No psychosocial concerns identified at this time.   Interventions Encouraged to attend Pulmonary Rehabilitation for the exercise   Continue Psychosocial Services  No Follow up required      Education:  Education Goals: Education classes will be provided on a weekly basis, covering required topics. Participant will state understanding/return demonstration of topics presented.  Learning Barriers/Preferences:     Learning Barriers/Preferences - 03/14/16 1248      Learning Barriers/Preferences   Learning Barriers None   Learning Preferences Written Material;Verbal Instruction;Video;Skilled Demonstration;Pictoral;Individual Instruction;Group Instruction;Audio      Education Topics: Risk Factor Reduction:  -Group instruction that is supported by a PowerPoint presentation. Instructor discusses the definition of a risk factor, different risk factors for pulmonary disease, and how the heart and lungs work together.     PULMONARY REHAB OTHER RESPIRATORY from 07/07/2016 in [place]  Date  06/23/16  Educator  EP  Instruction Review Code  2- meets goals/outcomes      Nutrition for Pulmonary Patient:  -Group instruction provided by PowerPoint slides, verbal discussion, and written materials to support  subject matter. The instructor gives an explanation and review of healthy diet recommendations, which includes a discussion on weight management, recommendations for fruit and vegetable consumption, as well as protein, fluid, caffeine, fiber, sodium, sugar, and alcohol. Tips for eating when patients are short of breath are discussed.   PULMONARY REHAB OTHER RESPIRATORY from 07/07/2016 in [place]  Date  06/16/16  Educator  RD  Instruction Review Code  R- Review/reinforce      Pursed Lip Breathing:  -Group instruction that is supported by demonstration and informational handouts. Instructor discusses the benefits of pursed lip and diaphragmatic breathing and detailed demonstration on how to preform both.     PULMONARY REHAB OTHER RESPIRATORY from 07/07/2016 in [place]  Date  03/24/16  Educator  EP  Instruction Review Code  2- meets goals/outcomes      Oxygen Safety:  -Group instruction provided by PowerPoint, verbal discussion, and written material to support subject matter. There is an overview of "What is Oxygen" and "Why do we need it".  Instructor also reviews how to create a safe environment for oxygen use, the importance of using oxygen as prescribed, and the risks of noncompliance. There is a brief discussion on traveling with oxygen and resources the patient may utilize.   PULMONARY REHAB OTHER RESPIRATORY from 07/07/2016 in [place]  Date  07/07/16  Educator  Truddie Crumble  Instruction Review Code  2- meets goals/outcomes      Oxygen Equipment:  -Group instruction provided by [employer] Staff utilizing handouts, written materials, and equipment demonstrations.   PULMONARY REHAB OTHER RESPIRATORY from 07/07/2016 in [place]  Date  04/07/16  Educator  [employer]  Instruction Review Code  2- meets goals/outcomes      Signs and Symptoms:  -Group instruction  provided by written material and verbal discussion to support subject matter. Warning signs and symptoms of infection, stroke, and heart attack are reviewed and when to call the physician/911 reinforced. Tips for preventing the spread of infection discussed.   PULMONARY REHAB OTHER RESPIRATORY from 10/15/2015 in [place]  Date  10/15/15  Educator  RN  Instruction Review Code  2- meets goals/outcomes      Advanced Directives:  -Group instruction provided by verbal instruction and written material to support subject matter. Instructor reviews Advanced Directive laws and proper instruction for filling out document.   PULMONARY REHAB OTHER RESPIRATORY from 10/15/2015 in [place]  Date  08/20/15  Educator  Mikki Santee  [place]  Instruction Review Code  2- meets goals/outcomes      Pulmonary Video:  -Group video education that reviews the importance of medication and oxygen compliance, exercise, good nutrition, pulmonary hygiene, and pursed lip and diaphragmatic breathing for the pulmonary patient.   PULMONARY REHAB OTHER RESPIRATORY from 07/07/2016 in [place]  Date  05/27/15  Educator  staff  Instruction Review Code  2- meets goals/outcomes      Exercise for the Pulmonary Patient:  -Group instruction that is supported by a PowerPoint presentation. Instructor discusses benefits of exercise, core components of exercise, frequency, duration, and intensity of an exercise routine, importance of utilizing pulse oximetry during exercise, safety while exercising, and options of places to exercise outside of rehab.     PULMONARY REHAB OTHER RESPIRATORY from 10/15/2015 in [place]  Date  10/08/15  Educator  EP  Instruction Review Code  2- meets goals/outcomes      Pulmonary Medications:  -Verbally interactive group education provided by instructor with focus on inhaled  medications and proper administration.   PULMONARY REHAB OTHER RESPIRATORY from 07/07/2016 in [place]  Date  06/30/16  Educator  pharm  Instruction Review Code  2- meets goals/outcomes      Anatomy and Physiology of the Respiratory System and Intimacy:  -Group instruction provided by PowerPoint, verbal discussion, and written material to support subject matter. Instructor reviews respiratory cycle and anatomical components of the respiratory system and their functions. Instructor also reviews differences in obstructive and restrictive respiratory diseases with examples of each. Intimacy, Sex, and Sexuality differences are reviewed with a discussion on how relationships can change when diagnosed with pulmonary disease. Common sexual concerns are reviewed.   PULMONARY REHAB OTHER RESPIRATORY from 07/07/2016 in [place]  Date  06/09/16  Educator  RN  Instruction Review Code  2- meets goals/outcomes      Knowledge Questionnaire Score:   Core Components/Risk Factors/Patient Goals at Admission:     Personal Goals and Risk Factors at Admission - 03/14/16 1252      Core Components/Risk Factors/Patient Goals on Admission   Increase Strength and Stamina Yes   Improve shortness of breath with ADL's Yes   Develop more efficient breathing techniques such as purse lipped breathing and diaphragmatic breathing; and practicing self-pacing with activity Yes      Core Components/Risk Factors/Patient Goals Review:      Goals and Risk Factor Review    Row Name 03/29/16 0814 03/29/16 0823 04/26/16 0842 05/16/16 1615 07/11/16 1626     Core Components/Risk Factors/Patient Goals Review   Personal Goals Review Increase Strength and Stamina;Improve shortness of breath with ADL's;Develop more efficient breathing techniques such as purse lipped breathing and diaphragmatic breathing and practicing self-pacing with activity.  - Increase  Strength and Stamina;Improve shortness of breath with ADL's;Develop more efficient breathing techniques such as purse lipped breathing and diaphragmatic breathing and practicing self-pacing with activity. Increase Strength and Stamina;Improve shortness of breath with ADL's;Develop more efficient breathing techniques such as purse lipped breathing and diaphragmatic breathing and practicing self-pacing with activity. Improve shortness of breath with ADL's;Develop more efficient breathing techniques such as purse lipped breathing and diaphragmatic breathing and practicing self-pacing with activity.   Review Patient has only attended 2 session. see comments sections on ITP see comments sections on ITP see comments sections on ITP see comments sections on ITP   Expected Outcomes See admission outcomes  -  See admission outcomes See admission outcomes See admission outcomes      Core Components/Risk Factors/Patient Goals at Discharge (Final Review):      Goals and Risk Factor Review - 07/11/16 1626      Core Components/Risk Factors/Patient Goals Review   Personal Goals Review Improve shortness of breath with ADL's;Develop more efficient breathing techniques such as purse lipped breathing and diaphragmatic breathing and practicing self-pacing with activity.   Review see comments sections on ITP   Expected Outcomes See admission outcomes      ITP Comments:   Comments: ITP REVIEW Pt is making expected slow progress toward pulmonary rehab goals after completing 27 sessions. She was extended past 24 session for failure to progress. She is doing chair yoga at the [facility] and she contributes failure to progress related to the muscular soreness from yoga. She rates most of her workouts a 15 on the RPE scale. She has been encouraged to continue to ambulate on the track as time allows instead of always stopping at 11 laps. EP will discuss goals and progression at today's session to see if patient is ready to  graduate related to failure to progress. Recommend continued exercise, life style modification, education, and utilization of breathing techniques to increase stamina and strength and decrease shortness of breath with exertion.

## 2016-07-14 ENCOUNTER — Encounter (HOSPITAL_COMMUNITY)
Admission: RE | Admit: 2016-07-14 | Discharge: 2016-07-14 | Disposition: A | Discharge status: Home / Self Care | Payer: Medicare Other | Source: Ambulatory Visit | Attending: Pulmonary Disease | Admitting: Pulmonary Disease

## 2016-07-14 VITALS — Wt 132.1 lb

## 2016-07-14 DIAGNOSIS — J432 Centrilobular emphysema: Secondary | ICD-10-CM

## 2016-07-14 DIAGNOSIS — J449 Chronic obstructive pulmonary disease, unspecified: Secondary | ICD-10-CM | POA: Diagnosis not present

## 2016-07-14 DIAGNOSIS — J439 Emphysema, unspecified: Secondary | ICD-10-CM

## 2016-07-14 NOTE — Progress Notes (Signed)
Daily Session Note  Patient Details  Name: Gina Fritz MRN: [id number] Date of Birth: November 04, 1940 Referring Provider:     Pulmonary Rehab Walk Test from 03/15/2016 in [place]  Referring Provider  Dr. Halford Chessman      Encounter Date: 07/14/2016  Check In:     Session Check In - 07/14/16 1030      Check-In   Location [facility]   Staff Present Rosebud Poles, RN, BSN;Molly diVincenzo, MS, ACSM RCEP, Exercise Physiologist;Lisa Ysidro Evert, RN;Yailen Zemaitis Rollene Rotunda, RN, BSN   Supervising physician immediately available to respond to emergencies [facility] immediately available   Physician(s) Dr. Cathlean Sauer   Medication changes reported     No   Fall or balance concerns reported    No   Tobacco Cessation No Change   Warm-up and Cool-down Performed as group-led instruction   Resistance Training Performed Yes   VAD Patient? No     Pain Assessment   Currently in Pain? No/denies   Multiple Pain Sites No      Capillary Blood Glucose: No results found for this or any previous visit (from the past 24 hour(s)).      Exercise Prescription Changes - 07/14/16 1233      Response to Exercise   Blood Pressure (Admit) 106/72   Blood Pressure (Exercise) 112/70   Blood Pressure (Exit) 100/60   Heart Rate (Admit) 63 bpm   Heart Rate (Exercise) 103 bpm   Heart Rate (Exit) 71 bpm   Oxygen Saturation (Admit) 96 %   Oxygen Saturation (Exercise) 95 %   Oxygen Saturation (Exit) 98 %   Rating of Perceived Exertion (Exercise) 15   Perceived Dyspnea (Exercise) 2   Duration Progress to 45 minutes of aerobic exercise without signs/symptoms of physical distress   Intensity THRR unchanged     Progression   Progression Continue to progress workloads to maintain intensity without signs/symptoms of physical distress.     Resistance Training   Training Prescription Yes   Weight orange bands   Reps 10-15   Time 10 Minutes     Interval Training   Interval Training No     NuStep   Level 5   Minutes 17   METs 2     Track   Laps 7   Minutes 17      History  Smoking Status  . Former Smoker  . Packs/day: 1.00  . Years: 25.00  . Types: Cigarettes  . Quit date: 02/05/2015  Smokeless Tobacco  . Never Used    Comment: Thinks her smoking was more than 25 years abut is not exact     Goals Met:  Improved SOB with ADL's Using PLB without cueing & demonstrates good technique No report of cardiac concerns or symptoms Strength training completed today  Goals Unmet:  Not Applicable  Comments: Service time is from 1030 to 1205   Dr. Rush Farmer is Medical Director for Pulmonary Rehab at [facility].

## 2016-07-19 ENCOUNTER — Encounter (HOSPITAL_COMMUNITY)
Admission: RE | Admit: 2016-07-19 | Discharge: 2016-07-19 | Disposition: A | Discharge status: Home / Self Care | Payer: Medicare Other | Source: Ambulatory Visit | Attending: Pulmonary Disease | Admitting: Pulmonary Disease

## 2016-07-19 VITALS — Wt 132.7 lb

## 2016-07-19 DIAGNOSIS — J439 Emphysema, unspecified: Secondary | ICD-10-CM

## 2016-07-19 DIAGNOSIS — J432 Centrilobular emphysema: Secondary | ICD-10-CM

## 2016-07-19 DIAGNOSIS — J449 Chronic obstructive pulmonary disease, unspecified: Secondary | ICD-10-CM | POA: Diagnosis not present

## 2016-07-19 NOTE — Progress Notes (Signed)
Daily Session Note  Patient Details  Name: Gina Fritz MRN: [id number] Date of Birth: 07/13/1940 Referring Provider:     Pulmonary Rehab Walk Test from 03/15/2016 in [place]  Referring Provider  Dr. Halford Chessman      Encounter Date: 07/19/2016  Check In:     Session Check In - 07/19/16 1219      Check-In   Location [facility]   Staff Present Su Hilt, MS, ACSM RCEP, Exercise Physiologist;Joan Leonia Reeves, RN, Luisa Hart, RN, Roque Cash, RN   Supervising physician immediately available to respond to emergencies [facility] immediately available   Physician(s) Dr. Cathlean Sauer   Medication changes reported     No   Fall or balance concerns reported    No   Tobacco Cessation No Change   Warm-up and Cool-down Performed as group-led instruction   Resistance Training Performed Yes   VAD Patient? No     Pain Assessment   Currently in Pain? No/denies   Multiple Pain Sites No      Capillary Blood Glucose: No results found for this or any previous visit (from the past 24 hour(s)).      Exercise Prescription Changes - 07/19/16 1200      Response to Exercise   Blood Pressure (Admit) 112/60   Blood Pressure (Exercise) 130/66   Blood Pressure (Exit) 104/70   Heart Rate (Admit) 62 bpm   Heart Rate (Exercise) 75 bpm   Heart Rate (Exit) 65 bpm   Oxygen Saturation (Admit) 96 %   Oxygen Saturation (Exercise) 94 %   Oxygen Saturation (Exit) 97 %   Rating of Perceived Exertion (Exercise) 15   Perceived Dyspnea (Exercise) 2   Duration Progress to 45 minutes of aerobic exercise without signs/symptoms of physical distress   Intensity THRR unchanged     Progression   Progression Continue to progress workloads to maintain intensity without signs/symptoms of physical distress.     Resistance Training   Training Prescription Yes   Weight orange bands   Reps 10-15   Time 10 Minutes     Interval Training   Interval Training No     NuStep   Level 5   Minutes 17   METs 2.1     Arm Ergometer   Level 4   Minutes 17     Track   Laps 11   Minutes 17      History  Smoking Status  . Former Smoker  . Packs/day: 1.00  . Years: 25.00  . Types: Cigarettes  . Quit date: 02/05/2015  Smokeless Tobacco  . Never Used    Comment: Thinks her smoking was more than 25 years abut is not exact     Goals Met:  Exercise tolerated well No report of cardiac concerns or symptoms Strength training completed today  Goals Unmet:  Not Applicable  Comments: Service time is from 10:30a to 12:10p    Dr. Rush Farmer is Medical Director for Pulmonary Rehab at [facility].

## 2016-07-21 ENCOUNTER — Encounter (HOSPITAL_COMMUNITY)
Admission: RE | Admit: 2016-07-21 | Discharge: 2016-07-21 | Disposition: A | Discharge status: Home / Self Care | Payer: Medicare Other | Source: Ambulatory Visit | Attending: Pulmonary Disease | Admitting: Pulmonary Disease

## 2016-07-21 VITALS — Wt 132.1 lb

## 2016-07-21 DIAGNOSIS — J432 Centrilobular emphysema: Secondary | ICD-10-CM

## 2016-07-21 DIAGNOSIS — J439 Emphysema, unspecified: Secondary | ICD-10-CM

## 2016-07-21 DIAGNOSIS — J449 Chronic obstructive pulmonary disease, unspecified: Secondary | ICD-10-CM | POA: Diagnosis not present

## 2016-07-21 NOTE — Progress Notes (Signed)
Daily Session Note  Patient Details  Name: Gina Fritz MRN: [id number] Date of Birth: 1941-01-23 Referring Provider:     Pulmonary Rehab Walk Test from 03/15/2016 in [place]  Referring Provider  Dr. Halford Chessman      Encounter Date: 07/21/2016  Check In:     Session Check In - 07/21/16 1030      Check-In   Location [facility]   Staff Present Su Hilt, MS, ACSM RCEP, Exercise Physiologist;Shantice Menger Leonia Reeves, RN, Luisa Hart, RN, BSN   Supervising physician immediately available to respond to emergencies [facility] immediately available   Physician(s) Dr. Candiss Norse   Medication changes reported     No   Fall or balance concerns reported    No   Tobacco Cessation No Change   Warm-up and Cool-down Performed as group-led instruction   Resistance Training Performed Yes   VAD Patient? No     Pain Assessment   Currently in Pain? No/denies   Multiple Pain Sites No      Capillary Blood Glucose: No results found for this or any previous visit (from the past 24 hour(s)).      Exercise Prescription Changes - 07/21/16 1200      Response to Exercise   Blood Pressure (Admit) 120/52   Blood Pressure (Exercise) 120/70   Blood Pressure (Exit) 110/64   Heart Rate (Admit) 68 bpm   Heart Rate (Exercise) 102 bpm   Heart Rate (Exit) 71 bpm   Oxygen Saturation (Admit) 97 %   Oxygen Saturation (Exercise) 93 %   Oxygen Saturation (Exit) 96 %   Rating of Perceived Exertion (Exercise) 13   Perceived Dyspnea (Exercise) 1   Duration Progress to 45 minutes of aerobic exercise without signs/symptoms of physical distress   Intensity THRR unchanged     Progression   Progression Continue to progress workloads to maintain intensity without signs/symptoms of physical distress.     Resistance Training   Training Prescription Yes   Weight orange bands   Reps 10-15   Time 10 Minutes     Interval Training   Interval Training No     Arm Ergometer   Level 4   Minutes 17     Track   Laps 14   Minutes 17      History  Smoking Status  . Former Smoker  . Packs/day: 1.00  . Years: 25.00  . Types: Cigarettes  . Quit date: 02/05/2015  Smokeless Tobacco  . Never Used    Comment: Thinks her smoking was more than 25 years abut is not exact     Goals Met:  Proper associated with RPD/PD & O2 Sat [place] with exercise equipment Improved SOB with ADL's Exercise tolerated well Strength training completed today  Goals Unmet:  Not Applicable  Comments: Service time is from 1030 to 1220    Dr. Rush Farmer is Medical Director for Pulmonary Rehab at [facility].

## 2016-07-26 ENCOUNTER — Encounter (HOSPITAL_COMMUNITY)
Admission: RE | Admit: 2016-07-26 | Discharge: 2016-07-26 | Disposition: A | Discharge status: Home / Self Care | Payer: Medicare Other | Source: Ambulatory Visit | Attending: Pulmonary Disease | Admitting: Pulmonary Disease

## 2016-07-26 VITALS — Wt 132.9 lb

## 2016-07-26 DIAGNOSIS — J432 Centrilobular emphysema: Secondary | ICD-10-CM

## 2016-07-26 DIAGNOSIS — J449 Chronic obstructive pulmonary disease, unspecified: Secondary | ICD-10-CM | POA: Diagnosis not present

## 2016-07-26 DIAGNOSIS — J439 Emphysema, unspecified: Secondary | ICD-10-CM

## 2016-07-26 NOTE — Progress Notes (Signed)
Daily Session Note  Patient Details  Name: Gina Fritz MRN: [id number] Date of Birth: [birth year] Referring Provider:     Pulmonary Rehab Walk Test from 03/15/2016 in [place]  Referring Provider  Dr. Halford Chessman      Encounter Date: 07/26/2016  Check In:     Session Check In - 07/26/16 1023      Check-In   Location [facility]   Staff Present Su Hilt, MS, ACSM RCEP, Exercise Physiologist;Joan Leonia Reeves, RN, Luisa Hart, RN, BSN   Supervising physician immediately available to respond to emergencies [facility] immediately available   Physician(s) Dr. Candiss Norse   Medication changes reported     No   Fall or balance concerns reported    No   Tobacco Cessation No Change   Warm-up and Cool-down Performed as group-led instruction   Resistance Training Performed Yes     Pain Assessment   Currently in Pain? No/denies   Multiple Pain Sites No      Capillary Blood Glucose: No results found for this or any previous visit (from the past 24 hour(s)).      Exercise Prescription Changes - 07/26/16 1200      Response to Exercise   Blood Pressure (Admit) 108/72   Blood Pressure (Exercise) 114/70   Blood Pressure (Exit) 98/60   Heart Rate (Admit) 59 bpm   Heart Rate (Exercise) 81 bpm   Heart Rate (Exit) 69 bpm   Oxygen Saturation (Admit) 96 %   Oxygen Saturation (Exercise) 95 %   Oxygen Saturation (Exit) 96 %   Rating of Perceived Exertion (Exercise) 13   Perceived Dyspnea (Exercise) 3   Duration Progress to 45 minutes of aerobic exercise without signs/symptoms of physical distress   Intensity THRR unchanged     Progression   Progression Continue to progress workloads to maintain intensity without signs/symptoms of physical distress.     Resistance Training   Training Prescription Yes   Weight orange bands   Reps 10-15   Time 10 Minutes     Interval Training   Interval Training No     NuStep   Level 5    Minutes 17   METs 2.1     Arm Ergometer   Level 4   Minutes 17     Track   Laps 14   Minutes 17      History  Smoking Status  . Former Smoker  . Packs/day: 1.00  . Years: 25.00  . Types: Cigarettes  . Quit date: 02/05/2015  Smokeless Tobacco  . Never Used    Comment: Thinks her smoking was more than 25 years abut is not exact     Goals Met:  Exercise tolerated well No report of cardiac concerns or symptoms Strength training completed today  Goals Unmet:  Not Applicable  Comments: Service time is from 10:30a to 12:00p    Dr. Rush Farmer is Medical Director for Pulmonary Rehab at [facility].

## 2016-07-28 ENCOUNTER — Encounter (HOSPITAL_COMMUNITY)
Admission: RE | Admit: 2016-07-28 | Discharge: 2016-07-28 | Disposition: A | Discharge status: Home / Self Care | Payer: Medicare Other | Source: Ambulatory Visit | Attending: Pulmonary Disease | Admitting: Pulmonary Disease

## 2016-07-28 VITALS — Wt 133.6 lb

## 2016-07-28 DIAGNOSIS — J449 Chronic obstructive pulmonary disease, unspecified: Secondary | ICD-10-CM | POA: Diagnosis not present

## 2016-07-28 DIAGNOSIS — J432 Centrilobular emphysema: Secondary | ICD-10-CM

## 2016-07-28 DIAGNOSIS — J439 Emphysema, unspecified: Secondary | ICD-10-CM

## 2016-07-28 NOTE — Progress Notes (Signed)
Daily Session Note  Patient Details  Name: Gina Fritz MRN: [id number] Date of Birth: Mar 11, 1941 Referring Provider:     Pulmonary Rehab Walk Test from 03/15/2016 in [place]  Referring Provider  Dr. Halford Chessman      Encounter Date: 07/28/2016  Check In:     Session Check In - 07/28/16 1102      Check-In   Location [facility]   Staff Present Trish Fountain, RN, Maxcine Ham, RN, BSN;Lisa Hughes, RN;Xyon Lukasik, MS, ACSM RCEP, Exercise Physiologist   Supervising physician immediately available to respond to emergencies [facility] immediately available   Physician(s) Dr. Posey Pronto   Medication changes reported     No   Fall or balance concerns reported    No   Tobacco Cessation No Change   Warm-up and Cool-down Performed as group-led instruction   Resistance Training Performed Yes   VAD Patient? No     Pain Assessment   Currently in Pain? No/denies   Multiple Pain Sites No      Capillary Blood Glucose: No results found for this or any previous visit (from the past 24 hour(s)).      Exercise Prescription Changes - 07/28/16 1200      Response to Exercise   Blood Pressure (Admit) 106/62   Blood Pressure (Exercise) 130/80   Blood Pressure (Exit) 124/70   Heart Rate (Admit) 63 bpm   Heart Rate (Exercise) 74 bpm   Heart Rate (Exit) 58 bpm   Oxygen Saturation (Admit) 95 %   Oxygen Saturation (Exercise) 95 %   Oxygen Saturation (Exit) 97 %   Rating of Perceived Exertion (Exercise) 15   Perceived Dyspnea (Exercise) 1   Duration Progress to 45 minutes of aerobic exercise without signs/symptoms of physical distress   Intensity THRR unchanged     Progression   Progression Continue to progress workloads to maintain intensity without signs/symptoms of physical distress.     Resistance Training   Training Prescription Yes   Weight orange bands   Reps 10-15   Time 10 Minutes     Interval Training   Interval  Training No     NuStep   Level 5   Minutes 17   METs 2     Arm Ergometer   Level 4   Minutes 17      History  Smoking Status  . Former Smoker  . Packs/day: 1.00  . Years: 25.00  . Types: Cigarettes  . Quit date: 02/05/2015  Smokeless Tobacco  . Never Used    Comment: Thinks her smoking was more than 25 years abut is not exact     Goals Met:  Exercise tolerated well No report of cardiac concerns or symptoms Strength training completed today  Goals Unmet:  Not Applicable  Comments: Service time is from 10:30a to 12:35a Patient attended education today with Dr. Nelda Marseille    Dr. Rush Farmer is Medical Director for Pulmonary Rehab at [facility].

## 2016-07-31 ENCOUNTER — Other Ambulatory Visit: Payer: Self-pay | Admitting: Family Medicine

## 2016-07-31 DIAGNOSIS — R519 Headache, unspecified: Secondary | ICD-10-CM

## 2016-07-31 DIAGNOSIS — R51 Headache: Principal | ICD-10-CM

## 2016-08-02 ENCOUNTER — Telehealth: Payer: Self-pay | Admitting: Pulmonary Disease

## 2016-08-02 ENCOUNTER — Encounter (HOSPITAL_COMMUNITY)
Admission: RE | Admit: 2016-08-02 | Discharge: 2016-08-02 | Disposition: A | Discharge status: Home / Self Care | Payer: Medicare Other | Source: Ambulatory Visit | Attending: Pulmonary Disease | Admitting: Pulmonary Disease

## 2016-08-02 VITALS — Wt 130.5 lb

## 2016-08-02 DIAGNOSIS — J449 Chronic obstructive pulmonary disease, unspecified: Secondary | ICD-10-CM

## 2016-08-02 DIAGNOSIS — J439 Emphysema, unspecified: Secondary | ICD-10-CM

## 2016-08-02 DIAGNOSIS — J432 Centrilobular emphysema: Secondary | ICD-10-CM

## 2016-08-02 NOTE — Progress Notes (Signed)
Daily Session Note  Patient Details  Name: Gina Fritz MRN: [id number] Date of Birth: [birth year] Referring Provider:     Pulmonary Rehab Walk Test from 03/15/2016 in [place]  Referring Provider  Dr. Halford Chessman      Encounter Date: 08/02/2016  Check In:     Session Check In - 08/02/16 1030      Check-In   Location [facility]   Staff Present Rosebud Poles, RN, BSN;Molly diVincenzo, MS, ACSM RCEP, Exercise Physiologist;Annabelle Rexroad Ysidro Evert, RN;Portia Rollene Rotunda, RN, BSN   Supervising physician immediately available to respond to emergencies [facility] immediately available   Physician(s) Dr. Posey Pronto   Medication changes reported     No   Fall or balance concerns reported    No   Tobacco Cessation No Change   Warm-up and Cool-down Performed as group-led instruction   Resistance Training Performed Yes   VAD Patient? No     Pain Assessment   Currently in Pain? No/denies   Multiple Pain Sites No      Capillary Blood Glucose: No results found for this or any previous visit (from the past 24 hour(s)).      Exercise Prescription Changes - 08/02/16 1200      Response to Exercise   Blood Pressure (Admit) 108/64   Blood Pressure (Exercise) 116/70   Blood Pressure (Exit) 110/60   Heart Rate (Admit) 66 bpm   Heart Rate (Exercise) 75 bpm   Heart Rate (Exit) 62 bpm   Oxygen Saturation (Admit) 97 %   Oxygen Saturation (Exercise) 94 %   Oxygen Saturation (Exit) 98 %   Rating of Perceived Exertion (Exercise) 15   Perceived Dyspnea (Exercise) 2   Duration Progress to 45 minutes of aerobic exercise without signs/symptoms of physical distress   Intensity THRR unchanged     Progression   Progression Continue to progress workloads to maintain intensity without signs/symptoms of physical distress.     Resistance Training   Training Prescription Yes   Weight orange bands   Reps 10-15   Time 10 Minutes     Interval Training   Interval  Training No     NuStep   Level 5   Minutes 17   METs 2     Arm Ergometer   Level 4   Minutes 17     Track   Laps 14   Minutes 17      History  Smoking Status  . Former Smoker  . Packs/day: 1.00  . Years: 25.00  . Types: Cigarettes  . Quit date: 02/05/2015  Smokeless Tobacco  . Never Used    Comment: Thinks her smoking was more than 25 years abut is not exact     Goals Met:  Exercise tolerated well No report of cardiac concerns or symptoms Strength training completed today  Goals Unmet:  Not Applicable  Comments: Service time is from 1030 to 1210    Dr. Rush Farmer is Medical Director for Pulmonary Rehab at [facility].

## 2016-08-02 NOTE — Telephone Encounter (Signed)
Order has been placed per [facility] request.

## 2016-08-04 ENCOUNTER — Encounter (HOSPITAL_COMMUNITY)
Admission: RE | Admit: 2016-08-04 | Discharge: 2016-08-04 | Disposition: A | Discharge status: Home / Self Care | Payer: Medicare Other | Source: Ambulatory Visit | Attending: Pulmonary Disease | Admitting: Pulmonary Disease

## 2016-08-04 VITALS — Wt 131.6 lb

## 2016-08-04 DIAGNOSIS — J432 Centrilobular emphysema: Secondary | ICD-10-CM

## 2016-08-04 DIAGNOSIS — J439 Emphysema, unspecified: Secondary | ICD-10-CM

## 2016-08-04 NOTE — Progress Notes (Signed)
Daily Session Note  Patient Details  Name: Gina Fritz MRN: [id number] Date of Birth: [birth year] Referring Provider:     Pulmonary Rehab Walk Test from 03/15/2016 in [place]  Referring Provider  Dr. Halford Chessman      Encounter Date: 08/04/2016  Check In:     Session Check In - 08/04/16 1108      Check-In   Location [facility]   Staff Present Rosebud Poles, RN, BSN;Lisa Ysidro Evert, RN;Eldwin Volkov Rollene Rotunda, RN, BSN   Supervising physician immediately available to respond to emergencies [facility] immediately available   Physician(s) Dr. Sloan Leiter   Medication changes reported     No   Fall or balance concerns reported    No   Tobacco Cessation No Change   Warm-up and Cool-down Performed as group-led instruction   Resistance Training Performed Yes   VAD Patient? No     Pain Assessment   Currently in Pain? No/denies   Multiple Pain Sites No      Capillary Blood Glucose: No results found for this or any previous visit (from the past 24 hour(s)).      Exercise Prescription Changes - 08/04/16 1310      Response to Exercise   Blood Pressure (Admit) 108/56   Blood Pressure (Exercise) 120/70   Blood Pressure (Exit) 112/70   Heart Rate (Admit) 58 bpm   Heart Rate (Exercise) 84 bpm   Heart Rate (Exit) 65 bpm   Oxygen Saturation (Admit) 98 %   Oxygen Saturation (Exercise) 91 %   Oxygen Saturation (Exit) 97 %   Rating of Perceived Exertion (Exercise) 15   Perceived Dyspnea (Exercise) 2   Duration Progress to 45 minutes of aerobic exercise without signs/symptoms of physical distress   Intensity THRR unchanged     Progression   Progression Continue to progress workloads to maintain intensity without signs/symptoms of physical distress.     Resistance Training   Training Prescription Yes   Weight orange bands   Reps 10-15   Time 10 Minutes     Interval Training   Interval Training No     Arm Ergometer   Level 4   Minutes  17     Track   Laps 8   Minutes 17      History  Smoking Status  . Former Smoker  . Packs/day: 1.00  . Years: 25.00  . Types: Cigarettes  . Quit date: 02/05/2015  Smokeless Tobacco  . Never Used    Comment: Thinks her smoking was more than 25 years abut is not exact     Goals Met:  Improved SOB with ADL's Using PLB without cueing & demonstrates good technique Exercise tolerated well No report of cardiac concerns or symptoms Strength training completed today  Goals Unmet:  Not Applicable  Comments: Service time is from 1030 to 1220   Dr. Rush Farmer is Medical Director for Pulmonary Rehab at [facility].

## 2016-08-09 ENCOUNTER — Encounter (HOSPITAL_COMMUNITY)
Admission: RE | Admit: 2016-08-09 | Discharge: 2016-08-09 | Disposition: A | Discharge status: Home / Self Care | Payer: Medicare Other | Source: Ambulatory Visit | Attending: Pulmonary Disease | Admitting: Pulmonary Disease

## 2016-08-09 DIAGNOSIS — J439 Emphysema, unspecified: Secondary | ICD-10-CM

## 2016-08-09 DIAGNOSIS — J432 Centrilobular emphysema: Secondary | ICD-10-CM

## 2016-08-11 ENCOUNTER — Other Ambulatory Visit: Payer: Self-pay | Admitting: Family Medicine

## 2016-08-11 ENCOUNTER — Encounter (HOSPITAL_COMMUNITY): Payer: Medicare Other

## 2016-08-11 DIAGNOSIS — J449 Chronic obstructive pulmonary disease, unspecified: Secondary | ICD-10-CM

## 2016-08-16 ENCOUNTER — Encounter (HOSPITAL_COMMUNITY): Payer: Medicare Other

## 2016-08-16 ENCOUNTER — Encounter (HOSPITAL_COMMUNITY)
Admission: RE | Admit: 2016-08-16 | Discharge: 2016-08-16 | Disposition: A | Discharge status: Home / Self Care | Payer: Medicare Other | Source: Ambulatory Visit | Attending: Pulmonary Disease | Admitting: Pulmonary Disease

## 2016-08-18 ENCOUNTER — Encounter (HOSPITAL_COMMUNITY): Payer: Medicare Other

## 2016-08-18 ENCOUNTER — Encounter (HOSPITAL_COMMUNITY)
Admission: RE | Admit: 2016-08-18 | Discharge: 2016-08-18 | Disposition: A | Discharge status: Home / Self Care | Payer: Medicare Other | Source: Ambulatory Visit | Attending: Pulmonary Disease | Admitting: Pulmonary Disease

## 2016-08-23 ENCOUNTER — Telehealth: Payer: Self-pay | Admitting: Pulmonary Disease

## 2016-08-23 ENCOUNTER — Encounter (HOSPITAL_COMMUNITY)
Admission: RE | Admit: 2016-08-23 | Discharge: 2016-08-23 | Disposition: A | Discharge status: Home / Self Care | Payer: Medicare Other | Source: Ambulatory Visit | Attending: Pulmonary Disease | Admitting: Pulmonary Disease

## 2016-08-23 ENCOUNTER — Encounter (HOSPITAL_COMMUNITY): Payer: Medicare Other

## 2016-08-23 NOTE — Telephone Encounter (Signed)
She could have had a brief apneic spell while asleep or could have had episode of reflux that triggered apneic spell.  If symptom recurs, then she should contact office again for further assessment.

## 2016-08-23 NOTE — Telephone Encounter (Signed)
Pt aware of information/recommendations per Dr Halford Chessman. Nothing further needed.

## 2016-08-23 NOTE — Telephone Encounter (Signed)
Called and spoke with pt and she stated that she is back in pulmonary maintenance program.  She stated that she spoke with the staff there of what happened to her on Sunday night and they advised her to let VS know.   Pt stated that she was in the bed Sunday night and she said that she had this weird sensation and then she said that she could not breath in or out.  She stated that when she finally could take a breath, she used her rescue inhaler.  She has not had this issue since Sunday night.  She does not remember what she had for dinner Sunday evening, but stated that she did not start any new meds or have any new foods.  She wanted to let VS know and see if he had any recs for her.  VS please advise. Thanks   Allergies  Allergen Reactions  . Penicillins Anaphylaxis    Has patient had a PCN reaction causing immediate rash, facial/tongue/throat swelling, SOB or lightheadedness with hypotension: Yes Has patient had a PCN reaction causing severe rash involving mucus membranes or skin necrosis: Yes Has patient had a PCN reaction that required hospitalization Yes Has patient had a PCN reaction occurring within the last 10 years: No If all of the above answers are "NO", then may proceed with Cephalosporin use.   . Sulfa Antibiotics Hives  . Soy Allergy Itching and Rash

## 2016-08-25 ENCOUNTER — Encounter (HOSPITAL_COMMUNITY): Payer: Medicare Other

## 2016-08-25 ENCOUNTER — Encounter (HOSPITAL_COMMUNITY)
Admission: RE | Admit: 2016-08-25 | Discharge: 2016-08-25 | Disposition: A | Discharge status: Home / Self Care | Payer: Medicare Other | Source: Ambulatory Visit | Attending: Pulmonary Disease | Admitting: Pulmonary Disease

## 2016-08-30 ENCOUNTER — Encounter (HOSPITAL_COMMUNITY): Payer: Medicare Other

## 2016-08-30 ENCOUNTER — Encounter (HOSPITAL_COMMUNITY)
Admission: RE | Admit: 2016-08-30 | Discharge: 2016-08-30 | Disposition: A | Discharge status: Home / Self Care | Payer: Medicare Other | Source: Ambulatory Visit | Attending: Pulmonary Disease | Admitting: Pulmonary Disease

## 2016-09-01 ENCOUNTER — Encounter: Payer: Self-pay | Admitting: Pulmonary Disease

## 2016-09-01 ENCOUNTER — Ambulatory Visit (INDEPENDENT_AMBULATORY_CARE_PROVIDER_SITE_OTHER): Payer: Medicare Other | Admitting: Pulmonary Disease

## 2016-09-01 ENCOUNTER — Encounter (HOSPITAL_COMMUNITY): Payer: Medicare Other

## 2016-09-01 VITALS — BP 102/80 | HR 65 | Ht 62.0 in | Wt 129.4 lb

## 2016-09-01 DIAGNOSIS — J449 Chronic obstructive pulmonary disease, unspecified: Secondary | ICD-10-CM

## 2016-09-01 DIAGNOSIS — R918 Other nonspecific abnormal finding of lung field: Secondary | ICD-10-CM | POA: Diagnosis not present

## 2016-09-01 DIAGNOSIS — J432 Centrilobular emphysema: Secondary | ICD-10-CM | POA: Diagnosis not present

## 2016-09-01 DIAGNOSIS — R0681 Apnea, not elsewhere classified: Secondary | ICD-10-CM

## 2016-09-01 DIAGNOSIS — K219 Gastro-esophageal reflux disease without esophagitis: Secondary | ICD-10-CM | POA: Diagnosis not present

## 2016-09-01 NOTE — Progress Notes (Signed)
Current Outpatient Prescriptions on File Prior to Visit  Medication Sig  . acetaminophen (TYLENOL) 325 MG tablet Take 2 tablets (650 mg total) by mouth every 6 (six) hours as needed for mild pain (or Fever >/= 101).  Marland Kitchen albuterol (PROAIR HFA) 108 (90 Base) MCG/ACT inhaler Inhale 2 puffs into the lungs every 6 (six) hours as needed for wheezing or shortness of breath.  Marland Kitchen alendronate (FOSAMAX) 70 MG tablet Take 70 mg by mouth every Sunday.  Marland Kitchen amLODipine (NORVASC) 10 MG tablet Take 1 tablet (10 mg total) by mouth daily. (Patient taking differently: Take 5 mg by mouth daily. )  . aspirin EC 81 MG tablet Take 81 mg by mouth daily.  Marland Kitchen atorvastatin (LIPITOR) 20 MG tablet Take 1 tablet (20 mg total) by mouth daily.  Lahoma Rocker alk-PHENObarbital (DONNATAL) 16.2 MG tablet Take 1 tablet by mouth daily as needed (ibs). No more than 2 tabs per month.  . BESIVANCE 0.6 % SUSP Place 1 drop into the right eye See admin instructions. Takes the day before and the day of the dr visit 4 times a day  . Cholecalciferol (VITAMIN D) 2000 UNITS CAPS Take 2,000 Units by mouth daily.  . Cyanocobalamin (VITAMIN B-12 PO) Take 1 tablet by mouth daily.  . DULERA 100-5 MCG/ACT AERO INHALE 2 PUFFS INTO THE LUNGS TWICE DAILY  . escitalopram (LEXAPRO) 20 MG tablet Take 20 mg by mouth daily.   . fluticasone (FLONASE) 50 MCG/ACT nasal spray Place 2 sprays into both nostrils daily.  Marland Kitchen gabapentin (NEURONTIN) 300 MG capsule TAKE 1 CAPSULE(300 MG) BY MOUTH THREE TIMES DAILY  . influenza vac recom quadrivalent (FLUBLOK) 0.5 ML injection Inject 0.5 mLs into the muscle once.  Marland Kitchen losartan (COZAAR) 50 MG tablet Take 1 tablet (50 mg total) by mouth 2 (two) times daily.  . montelukast (SINGULAIR) 10 MG tablet TAKE 1 TABLET(10 MG) BY MOUTH DAILY  . Multiple Vitamin (MULTIVITAMIN WITH MINERALS) TABS Take 1 tablet by mouth daily.  . Multiple Vitamins-Minerals (ICAPS AREDS 2 PO) Take 1 tablet by mouth 2 (two) times daily.  . Niacin (VITAMIN B-3 PO)  Take 1 tablet by mouth daily.  Marland Kitchen omeprazole (PRILOSEC) 20 MG capsule Take 1 capsule (20 mg total) by mouth daily.  . polyethylene glycol (MIRALAX / GLYCOLAX) packet Take 17 g by mouth daily as needed for mild constipation.  . Riboflavin (VITAMIN B-2 PO) Take 1 tablet by mouth daily.  . solifenacin (VESICARE) 10 MG tablet Take 1 tablet (10 mg total) by mouth daily.  . tamsulosin (FLOMAX) 0.4 MG CAPS capsule Take 1 capsule (0.4 mg total) by mouth daily after breakfast.  . fluticasone furoate-vilanterol (BREO ELLIPTA) 100-25 MCG/INH AEPB Inhale 1 puff into the lungs daily. (Patient not taking: Reported on 09/01/2016)   No current facility-administered medications on file prior to visit.      Chief Complaint  Patient presents with  . Follow-up    Pt states that she had an apneic spell one night where she stopped breathing - pt reports waking and realized that she had stopped breathing. Pt has been using reflux meds regularly and has not had another spell since starting Zantac 367m. Pt has graduated from second round of Pulm rehab and is now enrolled in the [facility] and [employer].      Pulmonary tests CT chest 02/05/15 >> moderate emphysema PFT 05/22/15 >> FEV1 1.54 (78%), FEV1% 70, TLC 6.22 (130%), DLCO 46%, no BD CT chest 03/11/16 >> stable nodules  Past medical history HTN, Depression, IBS, Tinnitus, Anxiety, Nephrolithiasis, GERD, HLD  Past surgical history, Family history, Social history, Allergies reviewed  Vital Signs BP 102/80 (BP Location: Left Arm, Cuff Size: Normal)   Pulse 65   Ht 5' 2"  (1.575 m)   Wt 129 lb 6.4 oz (58.7 kg)   SpO2 96%   BMI 23.67 kg/m   History of Present Illness Gina Fritz is a 76 y.o. female former smoker with COPD/emphysema and asthma.  She is working with Chief of Staff.  Still doesn't have energy like she used to.  Not feeling depressed.  Using dulera and singulair.  These help.  Not having cough, wheeze, or sputum.   Denies fever, chest pain, hemoptysis, or leg swelling.  Had negative sleep study at [facility].  Increased zantac, and no further apnea at night.  Physical Exam  General - pleasant Eyes - pupils reactive ENT - no sinus tenderness, no oral exudate, no LAN Cardiac - regular, no murmur Chest - no wheeze, rales Abd - soft, non tender Ext - no edema Skin - no rashes Neuro - normal strength Psych - normal mood   Assessment/Plan  COPD with emphysema and asthma. - continue dulera, singulair and prn albuterol - encouraged her to keep up with exercises  LPR with apnea. - continue prilosec and zantac per PCP  Right upper lung nodule. - f/u CT chest w/o contrast in December 2018   Patient Instructions  CT chest in December 2018  Follow up after CT chest in December 2018   Time spent 27 minutes  Chesley Mires, MD [place] [facility] Pager:  [phone] 09/01/2016, 10:48 AM

## 2016-09-01 NOTE — Patient Instructions (Signed)
CT chest in December 2018  Follow up after CT chest in December 2018

## 2016-09-06 ENCOUNTER — Encounter (HOSPITAL_COMMUNITY): Payer: Medicare Other

## 2016-09-06 ENCOUNTER — Encounter (HOSPITAL_COMMUNITY)
Admission: RE | Admit: 2016-09-06 | Discharge: 2016-09-06 | Disposition: A | Discharge status: Home / Self Care | Payer: Medicare Other | Source: Ambulatory Visit | Attending: Pulmonary Disease | Admitting: Pulmonary Disease

## 2016-09-06 DIAGNOSIS — J449 Chronic obstructive pulmonary disease, unspecified: Secondary | ICD-10-CM | POA: Insufficient documentation

## 2016-09-07 NOTE — Progress Notes (Signed)
Gina Fritz started the Pulmonary Maintenance [facility] 08/16/2016. She has tolerated exercise well without any difficulties.

## 2016-09-08 ENCOUNTER — Encounter (HOSPITAL_COMMUNITY)
Admission: RE | Admit: 2016-09-08 | Discharge: 2016-09-08 | Disposition: A | Discharge status: Home / Self Care | Payer: Medicare Other | Source: Ambulatory Visit | Attending: Pulmonary Disease | Admitting: Pulmonary Disease

## 2016-09-08 ENCOUNTER — Encounter (HOSPITAL_COMMUNITY): Payer: Medicare Other

## 2016-09-12 ENCOUNTER — Encounter (HOSPITAL_COMMUNITY): Payer: Self-pay

## 2016-09-12 DIAGNOSIS — J439 Emphysema, unspecified: Secondary | ICD-10-CM

## 2016-09-12 NOTE — Progress Notes (Signed)
Discharge Summary  Patient Details  Name: Gina Fritz MRN: [id number] Date of Birth: [birth year] Referring Provider:     Pulmonary Rehab Walk Test from 03/15/2016 in [place]  Referring Provider  Dr. Halford Chessman       Number of Visits: 24  Reason for Discharge:  Patient reached a stable level of exercise. Patient independent in their exercise.  Smoking History:  History  Smoking Status  . Former Smoker  . Packs/day: 1.00  . Years: 25.00  . Types: Cigarettes  . Quit date: 02/05/2015  Smokeless Tobacco  . Never Used    Comment: Thinks her smoking was more than 25 years abut is not exact     Diagnosis:  Pulmonary emphysema, unspecified emphysema type ([place])  ADL UCSD:   Initial Exercise Prescription:   Discharge Exercise Prescription (Final Exercise Prescription Changes):     Exercise Prescription Changes - 08/04/16 1310      Response to Exercise   Blood Pressure (Admit) 108/56   Blood Pressure (Exercise) 120/70   Blood Pressure (Exit) 112/70   Heart Rate (Admit) 58 bpm   Heart Rate (Exercise) 84 bpm   Heart Rate (Exit) 65 bpm   Oxygen Saturation (Admit) 98 %   Oxygen Saturation (Exercise) 91 %   Oxygen Saturation (Exit) 97 %   Rating of Perceived Exertion (Exercise) 15   Perceived Dyspnea (Exercise) 2   Duration Progress to 45 minutes of aerobic exercise without signs/symptoms of physical distress   Intensity THRR unchanged     Progression   Progression Continue to progress workloads to maintain intensity without signs/symptoms of physical distress.     Resistance Training   Training Prescription Yes   Weight orange bands   Reps 10-15   Time 10 Minutes     Interval Training   Interval Training No     Arm Ergometer   Level 4   Minutes 17     Track   Laps 8   Minutes 17      Functional Capacity:     6 Minute Walk    Row Name 08/11/16 0720         6 Minute Walk   Phase Discharge     Distance 1200  feet     Walk Time 6 minutes     # of Rest Breaks 0     MPH 2.27     METS 2.76     RPE 13     Perceived Dyspnea  2     Symptoms No     Resting HR 69 bpm     Resting BP 110/62     Max Ex. HR 98 bpm     Max Ex. BP 112/80       Interval HR   Baseline HR 69     1 Minute HR 80     2 Minute HR 92     3 Minute HR 90     4 Minute HR 92     5 Minute HR 92     6 Minute HR 98     2 Minute Post HR 75     Interval Heart Rate? Yes       Interval Oxygen   Interval Oxygen? Yes     Baseline Oxygen Saturation % 98 %     Baseline Liters of Oxygen 0 L     1 Minute Oxygen Saturation % 98 %     1 Minute Liters of Oxygen  0 L     2 Minute Oxygen Saturation % 96 %     2 Minute Liters of Oxygen 0 L     3 Minute Oxygen Saturation % 94 %     3 Minute Liters of Oxygen 0 L     4 Minute Oxygen Saturation % 94 %     4 Minute Liters of Oxygen 0 L     5 Minute Oxygen Saturation % 95 %     5 Minute Liters of Oxygen 0 L     6 Minute Oxygen Saturation % 94 %     6 Minute Liters of Oxygen 0 L     2 Minute Post Oxygen Saturation % 96 %     2 Minute Post Liters of Oxygen 0 L        Psychological, QOL, Others - Outcomes: PHQ 2/9: Depression screen [facility] 2/9 08/09/2016 05/25/2016 03/14/2016 10/27/2015 07/06/2015  Decreased Interest 0 0 0 0 0  Down, Depressed, Hopeless 0 0 1 0 1  PHQ - 2 Score 0 0 1 0 1    Quality of Life:   Personal Goals: Goals established at orientation with interventions provided to work toward goal.    Personal Goals Discharge:   Nutrition & Weight - Outcomes:    Nutrition:   Nutrition Discharge:   Education Questionnaire Score:   Goals reviewed with patient; copy given to patient. Gina Fritz has enrolled in the pulmonary rehab maintenance program where she plans to continue her exercise.

## 2016-09-13 ENCOUNTER — Encounter (HOSPITAL_COMMUNITY): Payer: Medicare Other

## 2016-09-15 ENCOUNTER — Encounter (HOSPITAL_COMMUNITY): Payer: Medicare Other

## 2016-09-19 NOTE — Progress Notes (Signed)
Referring-Gina G. Martinique, MD Reason for referral-Hypertension and syncope.  HPI: 76 yo female for evaluation of hypertension and syncope at the request of Gina G. Martinique, MD. Previously followed by Dr Vear Clock, MD. echocardiogram August 2015 showed normal LV function, grade 1 diastolic dysfunction, trace aortic insufficiency, mild mitral regurgitation, mild tricuspid regurgitation and moderate pulmonary hypertension. Previous event monitor 7/15 for dizziness negative. Pt denies CP, palpitations or syncope. She has DOE but no orthopnea, PND, pedal edema or claudication.   Current Outpatient Prescriptions  Medication Sig Dispense Refill  . acetaminophen (TYLENOL) 325 MG tablet Take 2 tablets (650 mg total) by mouth every 6 (six) hours as needed for mild pain (or Fever >/= 101). 30 tablet 1  . albuterol (PROAIR HFA) 108 (90 Base) MCG/ACT inhaler Inhale 2 puffs into the lungs every 6 (six) hours as needed for wheezing or shortness of breath. 1 Inhaler 2  . alendronate (FOSAMAX) 70 MG tablet Take 70 mg by mouth every Sunday.    Marland Kitchen amLODipine (NORVASC) 10 MG tablet Take 1 tablet (10 mg total) by mouth daily. (Patient taking differently: Take 5 mg by mouth daily. ) 30 tablet 3  . aspirin EC 81 MG tablet Take 81 mg by mouth daily.    Marland Kitchen atorvastatin (LIPITOR) 20 MG tablet Take 1 tablet (20 mg total) by mouth daily. 90 tablet 2  . belladona alk-PHENObarbital (DONNATAL) 16.2 MG tablet Take 1 tablet by mouth daily as needed (ibs). No more than 2 tabs per month. 15 tablet 1  . BESIVANCE 0.6 % SUSP Place 1 drop into the right eye See admin instructions. Takes the day before and the day of the dr visit 4 times a day    . Cholecalciferol (VITAMIN D) 2000 UNITS CAPS Take 2,000 Units by mouth daily.    . Cyanocobalamin (VITAMIN B-12 PO) Take 1 tablet by mouth daily.    . DULERA 100-5 MCG/ACT AERO INHALE 2 PUFFS INTO THE LUNGS TWICE DAILY 13 g 5  . escitalopram (LEXAPRO) 20 MG tablet Take 20 mg by mouth  daily.     . fluticasone (FLONASE) 50 MCG/ACT nasal spray Place 2 sprays into both nostrils daily. 16 g 5  . fluticasone furoate-vilanterol (BREO ELLIPTA) 100-25 MCG/INH AEPB Inhale 1 puff into the lungs daily. 28 each 0  . gabapentin (NEURONTIN) 300 MG capsule TAKE 1 CAPSULE(300 MG) BY MOUTH THREE TIMES DAILY 90 capsule 2  . influenza vac recom quadrivalent (FLUBLOK) 0.5 ML injection Inject 0.5 mLs into the muscle once.    Marland Kitchen losartan (COZAAR) 50 MG tablet Take 1 tablet (50 mg total) by mouth 2 (two) times daily. 180 tablet 1  . montelukast (SINGULAIR) 10 MG tablet TAKE 1 TABLET(10 MG) BY MOUTH DAILY 90 tablet 1  . Multiple Vitamin (MULTIVITAMIN WITH MINERALS) TABS Take 1 tablet by mouth daily.    . Multiple Vitamins-Minerals (ICAPS AREDS 2 PO) Take 1 tablet by mouth 2 (two) times daily.    . Niacin (VITAMIN B-3 PO) Take 1 tablet by mouth daily.    Marland Kitchen omeprazole (PRILOSEC) 20 MG capsule Take 1 capsule (20 mg total) by mouth daily. 90 capsule 1  . polyethylene glycol (MIRALAX / GLYCOLAX) packet Take 17 g by mouth daily as needed for mild constipation.    . ranitidine (ZANTAC) 300 MG tablet Take 300 mg by mouth at bedtime.    . Riboflavin (VITAMIN B-2 PO) Take 1 tablet by mouth daily.    . solifenacin (VESICARE) 10 MG tablet  Take 1 tablet (10 mg total) by mouth daily. 90 tablet 2  . tamsulosin (FLOMAX) 0.4 MG CAPS capsule Take 1 capsule (0.4 mg total) by mouth daily after breakfast. 30 capsule 2   No current facility-administered medications for this visit.     Allergies  Allergen Reactions  . Penicillins Anaphylaxis    Has patient had a PCN reaction causing immediate rash, facial/tongue/throat swelling, SOB or lightheadedness with hypotension: Yes Has patient had a PCN reaction causing severe rash involving mucus membranes or skin necrosis: Yes Has patient had a PCN reaction that required hospitalization Yes Has patient had a PCN reaction occurring within the last 10 years: No If all of the  above answers are "NO", then may proceed with Cephalosporin use.   . Sulfa Antibiotics Hives  . Soy Allergy Itching and Rash     Past Medical History:  Diagnosis Date  . Allergic rhinitis   . Anxiety   . CAP (community acquired pneumonia)    dx 02-05-2015  . Colitis    dx 02-05-2015  . COPD with emphysema ([place])   . Depression   . Dizziness   . GERD (gastroesophageal reflux disease)   . History of chronic sinusitis   . History of kidney stones   . Hyperlipidemia   . Hypertension   . Irritable bowel syndrome   . Loss of weight   . Pulmonary nodules    benign and stable per CT  . Right ureteral stone   . Sensorineural hearing loss, unilateral   . Subjective tinnitus     Past Surgical History:  Procedure Laterality Date  . CATARACT EXTRACTION Left   . CYSTO/  LEFT URETEROSCOPIC STONE EXTRACTION/ STENT PLACEMENT  08-10-2009  . ECTOPIC PREGNANCY SURGERY     and Appendectomy  . NASAL SINUS SURGERY    . REMOVAL CYST RIGHT ARM  age 63  . TUBAL LIGATION      Social History   Social History  . Marital status: Married    Spouse name: [name]  . Number of children: 3  . Years of education: college   Occupational History  .      Retired   Social History Main Topics  . Smoking status: Former Smoker    Packs/day: 1.00    Years: 25.00    Types: Cigarettes    Quit date: 02/05/2015  . Smokeless tobacco: Never Used     Comment: Thinks her smoking was more than 25 years abut is not exact   . Alcohol use 0.6 oz/week    1 Glasses of wine per week     Comment: One drink a week.  . Drug use: No  . Sexual activity: Not on file   Other Topics Concern  . Not on file   Social History Narrative   Patient is married [name]) and lives at home with her husband.   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   Caffeine- Six cups of coffee daily and six cups of coke cola daily.          Family History  Problem Relation Age of Onset  .  Congestive Heart Failure Mother   . Hearing loss Mother   . Stroke Mother        syndrome  . Hypertension Father   . Other Unknown        thyroid disorder    ROS: no fevers or chills, productive cough, hemoptysis, dysphasia, odynophagia, melena, hematochezia, dysuria,  hematuria, rash, seizure activity, orthopnea, PND, pedal edema, claudication. Remaining systems are negative.  Physical Exam:   Blood pressure 98/60, pulse 64, height 5' 2"  (1.575 m), weight 59 kg (130 lb).  General:  Well developed/well nourished in NAD Skin warm/dry Patient not depressed No peripheral clubbing Back-normal HEENT-normal/normal eyelids Neck supple/normal carotid upstroke bilaterally; no bruits; no JVD; no thyromegaly chest - CTA/ normal expansion CV - RRR/normal S1 and S2; no murmurs, rubs or gallops;  PMI nondisplaced Abdomen -NT/ND, no HSM, no mass, + bowel sounds, no bruit 2+ femoral pulses, no bruits Ext-no edema, chords, 2+ DP Neuro-grossly nonfocal  ECG - 06/20/2016-sinus rhythm with no significant ST changes. personally reviewed  A/P  1 Hypertension-patient's blood pressure is borderline. I have asked him to track her pressure at home. She will bring her cuff to the office and we will correlate with ours. We will make further adjustments based on follow-up readings.  2 history of presyncope-this apparently was felt to be related to migraine headaches by their report. Her symptoms have resolved and we will not pursue further evaluation.  3 hyperlipidemia-continue statin. Lipids and liver monitored by primary care.  Kirk Ruths, MD

## 2016-09-20 ENCOUNTER — Encounter (HOSPITAL_COMMUNITY)
Admission: RE | Admit: 2016-09-20 | Discharge: 2016-09-20 | Disposition: A | Discharge status: Home / Self Care | Payer: Medicare Other | Source: Ambulatory Visit | Attending: Pulmonary Disease | Admitting: Pulmonary Disease

## 2016-09-21 ENCOUNTER — Telehealth: Payer: Self-pay | Admitting: Cardiology

## 2016-09-21 NOTE — Telephone Encounter (Signed)
09/21/16 - Received incoming records from Dr. Einar Gip for upcoming appointment on 09/26/16 @ 1:20pm with Dr. Stanford Breed. Records given to [facility]. aib

## 2016-09-22 ENCOUNTER — Encounter (HOSPITAL_COMMUNITY)
Admission: RE | Admit: 2016-09-22 | Discharge: 2016-09-22 | Disposition: A | Discharge status: Home / Self Care | Payer: Medicare Other | Source: Ambulatory Visit | Attending: Pulmonary Disease | Admitting: Pulmonary Disease

## 2016-09-26 ENCOUNTER — Encounter: Payer: Self-pay | Admitting: Cardiology

## 2016-09-26 ENCOUNTER — Ambulatory Visit (INDEPENDENT_AMBULATORY_CARE_PROVIDER_SITE_OTHER): Payer: Medicare Other | Admitting: Cardiology

## 2016-09-26 VITALS — BP 98/60 | HR 64 | Ht 62.0 in | Wt 130.0 lb

## 2016-09-26 DIAGNOSIS — I10 Essential (primary) hypertension: Secondary | ICD-10-CM

## 2016-09-26 DIAGNOSIS — E7800 Pure hypercholesterolemia, unspecified: Secondary | ICD-10-CM | POA: Diagnosis not present

## 2016-09-26 NOTE — Patient Instructions (Signed)
Your physician wants you to follow-up in: 6 MONTHS WITH DR CRENSHAW You will receive a reminder letter in the mail two months in advance. If you don't receive a letter, please call our office to schedule the follow-up appointment.  

## 2016-09-27 ENCOUNTER — Encounter (HOSPITAL_COMMUNITY)
Admission: RE | Admit: 2016-09-27 | Discharge: 2016-09-27 | Disposition: A | Discharge status: Home / Self Care | Payer: Medicare Other | Source: Ambulatory Visit | Attending: Pulmonary Disease | Admitting: Pulmonary Disease

## 2016-09-29 ENCOUNTER — Encounter (HOSPITAL_COMMUNITY)
Admission: RE | Admit: 2016-09-29 | Discharge: 2016-09-29 | Disposition: A | Discharge status: Home / Self Care | Payer: Medicare Other | Source: Ambulatory Visit | Attending: Pulmonary Disease | Admitting: Pulmonary Disease

## 2016-09-30 ENCOUNTER — Encounter (INDEPENDENT_AMBULATORY_CARE_PROVIDER_SITE_OTHER): Payer: Medicare Other | Admitting: Ophthalmology

## 2016-09-30 DIAGNOSIS — I10 Essential (primary) hypertension: Secondary | ICD-10-CM

## 2016-09-30 DIAGNOSIS — H43813 Vitreous degeneration, bilateral: Secondary | ICD-10-CM | POA: Diagnosis not present

## 2016-09-30 DIAGNOSIS — H353122 Nonexudative age-related macular degeneration, left eye, intermediate dry stage: Secondary | ICD-10-CM

## 2016-09-30 DIAGNOSIS — H353211 Exudative age-related macular degeneration, right eye, with active choroidal neovascularization: Secondary | ICD-10-CM

## 2016-09-30 DIAGNOSIS — H35033 Hypertensive retinopathy, bilateral: Secondary | ICD-10-CM

## 2016-10-04 ENCOUNTER — Encounter (HOSPITAL_COMMUNITY)
Admission: RE | Admit: 2016-10-04 | Discharge: 2016-10-04 | Disposition: A | Discharge status: Home / Self Care | Payer: Medicare Other | Source: Ambulatory Visit | Attending: Pulmonary Disease | Admitting: Pulmonary Disease

## 2016-10-04 DIAGNOSIS — J449 Chronic obstructive pulmonary disease, unspecified: Secondary | ICD-10-CM | POA: Diagnosis not present

## 2016-10-06 ENCOUNTER — Encounter (HOSPITAL_COMMUNITY)
Admission: RE | Admit: 2016-10-06 | Discharge: 2016-10-06 | Disposition: A | Discharge status: Home / Self Care | Payer: Medicare Other | Source: Ambulatory Visit | Attending: Pulmonary Disease | Admitting: Pulmonary Disease

## 2016-10-11 ENCOUNTER — Encounter (HOSPITAL_COMMUNITY)
Admission: RE | Admit: 2016-10-11 | Discharge: 2016-10-11 | Disposition: A | Discharge status: Home / Self Care | Payer: Medicare Other | Source: Ambulatory Visit | Attending: Pulmonary Disease | Admitting: Pulmonary Disease

## 2016-10-13 ENCOUNTER — Encounter (HOSPITAL_COMMUNITY)
Admission: RE | Admit: 2016-10-13 | Discharge: 2016-10-13 | Disposition: A | Discharge status: Home / Self Care | Payer: Medicare Other | Source: Ambulatory Visit | Attending: Pulmonary Disease | Admitting: Pulmonary Disease

## 2016-10-18 ENCOUNTER — Encounter (HOSPITAL_COMMUNITY)
Admission: RE | Admit: 2016-10-18 | Discharge: 2016-10-18 | Disposition: A | Discharge status: Home / Self Care | Payer: Medicare Other | Source: Ambulatory Visit | Attending: Pulmonary Disease | Admitting: Pulmonary Disease

## 2016-10-18 NOTE — Progress Notes (Signed)
HPI:   Gina Fritz is a 76 y.o. female, who is here today with her husband to follow on some chronic medical problems.   Last seen on  05/26/16 for acute visit, lower back pain.  Since her last OV she has followed with Dr Halford Chessman, pulmonologist; Dr Stanford Breed, cardiologists. She is on pulmonary rehab. She started water exercises 3 times per week and yoga. She also would like to sign up for "balance exercises", which was recommended by PT during pulmonary rehab.   Back pain still present but mild, she takes Ibuprofen or Acetaminophen as needed. Pain is more bothersome when turning in bed. No radiated. During the day it is also exacerbated by some activities like prolonged walking or standing, getting up after prolonged sitting. She denies saddle anesthesia or LE numbness/tingling, no urine/bowel continence changes.  Anxiety, currently she is on Lexapro  Denies depressed mood or suicidal thoughts. Sleeping well in general.  Hypertension:   Currently on Amlodipine 10 mg daily and Cozaar 50 mg bid. 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 Occasionally lightheadedness when standing up.  She is not checking BP's at home but they are check during pulmonary rehab: 100-104/60-70's, a few < 100/60 (96/50,94/60,96/50,98/62).  She has not noted unusual headache, visual changes, exertional chest pain, dyspnea,  focal weakness, or edema.   Lab Results  Component Value Date   CREATININE 0.87 02/24/2016   BUN 15 02/24/2016   NA 140 02/24/2016   K 4.0 02/24/2016   CL 105 02/24/2016   CO2 25 02/24/2016     Hx of elevated alk phosphatase. Osteoporosis on Fosamax. Denies alcohol high intake or abuse.   Lab Results  Component Value Date   ALT 13 02/24/2016   AST 15 02/24/2016   ALKPHOS 141 (H) 02/24/2016   BILITOT 0.6 02/24/2016    Concerns today: Neck pain, she thinks  she may have sletp "wrong."  Left-sided neck pain for about a week, stable. Dull pain, 2/10, no radiated. Denies UE pain or numbness. No changes in ROM,movement exacerbates pain. No hx of trauma.  About 2 months ago she had episode when she could not breath "in or out" for a few seconds, she was awake and in bed. No chest pain,palpitation, or diaphoresis associated. According to pt, Dr Halford Chessman ordered sleep study and negative. ? GERD,so she added Zantac 300 mg at bedtime. She has not had another episode. She is also taking Omeprazole 20 mg daily.  Denies abdominal pain, nausea, vomiting, changes in bowel habits, blood in stool or melena.   Review of Systems  Constitutional: Positive for fatigue. Negative for activity change, appetite change and fever.  HENT: Negative for mouth sores, nosebleeds and trouble swallowing.   Eyes: Negative for redness and visual disturbance.  Respiratory: Positive for shortness of breath (stable. Hx of COPD). Negative for cough and wheezing.   Cardiovascular: Negative for chest pain, palpitations and leg swelling.  Gastrointestinal: Negative for abdominal pain, nausea and vomiting.       Negative for changes in bowel habits.  Genitourinary: Negative for decreased urine volume, dysuria and hematuria.  Musculoskeletal: Positive for back pain, gait problem and neck pain.  Skin: Negative for pallor.  Neurological: Negative for syncope, weakness and headaches.  Psychiatric/Behavioral: Negative for confusion. The patient is nervous/anxious.       Current Outpatient Prescriptions on File Prior to Visit  Medication Sig Dispense Refill  .  acetaminophen (TYLENOL) 325 MG tablet Take 2 tablets (650 mg total) by mouth every 6 (six) hours as needed for mild pain (or Fever >/= 101). 30 tablet 1  . albuterol (PROAIR HFA) 108 (90 Base) MCG/ACT inhaler Inhale 2 puffs into the lungs every 6 (six) hours as needed for wheezing or shortness of breath. 1 Inhaler 2  .  alendronate (FOSAMAX) 70 MG tablet Take 70 mg by mouth every Sunday.    Marland Kitchen aspirin EC 81 MG tablet Take 81 mg by mouth daily.    Marland Kitchen atorvastatin (LIPITOR) 20 MG tablet Take 1 tablet (20 mg total) by mouth daily. 90 tablet 2  . belladona alk-PHENObarbital (DONNATAL) 16.2 MG tablet Take 1 tablet by mouth daily as needed (ibs). No more than 2 tabs per month. 15 tablet 1  . BESIVANCE 0.6 % SUSP Place 1 drop into the right eye See admin instructions. Takes the day before and the day of the dr visit 4 times a day    . Cholecalciferol (VITAMIN D) 2000 UNITS CAPS Take 2,000 Units by mouth daily.    . Cyanocobalamin (VITAMIN B-12 PO) Take 1 tablet by mouth daily.    . DULERA 100-5 MCG/ACT AERO INHALE 2 PUFFS INTO THE LUNGS TWICE DAILY 13 g 5  . escitalopram (LEXAPRO) 20 MG tablet Take 20 mg by mouth daily.     . fluticasone (FLONASE) 50 MCG/ACT nasal spray Place 2 sprays into both nostrils daily. 16 g 5  . fluticasone furoate-vilanterol (BREO ELLIPTA) 100-25 MCG/INH AEPB Inhale 1 puff into the lungs daily. 28 each 0  . gabapentin (NEURONTIN) 300 MG capsule TAKE 1 CAPSULE(300 MG) BY MOUTH THREE TIMES DAILY 90 capsule 2  . influenza vac recom quadrivalent (FLUBLOK) 0.5 ML injection Inject 0.5 mLs into the muscle once.    Marland Kitchen losartan (COZAAR) 50 MG tablet Take 1 tablet (50 mg total) by mouth 2 (two) times daily. 180 tablet 1  . montelukast (SINGULAIR) 10 MG tablet TAKE 1 TABLET(10 MG) BY MOUTH DAILY 90 tablet 1  . Multiple Vitamin (MULTIVITAMIN WITH MINERALS) TABS Take 1 tablet by mouth daily.    . Multiple Vitamins-Minerals (ICAPS AREDS 2 PO) Take 1 tablet by mouth 2 (two) times daily.    . Niacin (VITAMIN B-3 PO) Take 1 tablet by mouth daily.    . polyethylene glycol (MIRALAX / GLYCOLAX) packet Take 17 g by mouth daily as needed for mild constipation.    . ranitidine (ZANTAC) 300 MG tablet Take 300 mg by mouth at bedtime.    . Riboflavin (VITAMIN B-2 PO) Take 1 tablet by mouth daily.    . solifenacin  (VESICARE) 10 MG tablet Take 1 tablet (10 mg total) by mouth daily. 90 tablet 2  . tamsulosin (FLOMAX) 0.4 MG CAPS capsule Take 1 capsule (0.4 mg total) by mouth daily after breakfast. 30 capsule 2   No current facility-administered medications on file prior to visit.      Past Medical History:  Diagnosis Date  . Allergic rhinitis   . Anxiety   . CAP (community acquired pneumonia)    dx 02-05-2015  . Colitis    dx 02-05-2015  . COPD with emphysema ([place])   . Depression   . Dizziness   . GERD (gastroesophageal reflux disease)   . History of chronic sinusitis   . History of kidney stones   . Hyperlipidemia   . Hypertension   . Irritable bowel syndrome   . Loss of weight   . Pulmonary nodules  benign and stable per CT  . Right ureteral stone   . Sensorineural hearing loss, unilateral   . Subjective tinnitus    Allergies  Allergen Reactions  . Penicillins Anaphylaxis    Has patient had a PCN reaction causing immediate rash, facial/tongue/throat swelling, SOB or lightheadedness with hypotension: Yes Has patient had a PCN reaction causing severe rash involving mucus membranes or skin necrosis: Yes Has patient had a PCN reaction that required hospitalization Yes Has patient had a PCN reaction occurring within the last 10 years: No If all of the above answers are "NO", then may proceed with Cephalosporin use.   . Sulfa Antibiotics Hives  . Soy Allergy Itching and Rash    Social History   Social History  . Marital status: Married    Spouse name: Gina Fritz  . Number of children: 3  . Years of education: college   Occupational History  .      Retired   Social History Main Topics  . Smoking status: Former Smoker    Packs/day: 1.00    Years: 25.00    Types: Cigarettes    Quit date: 02/05/2015  . Smokeless tobacco: Never Used     Comment: Thinks her smoking was more than 25 years abut is not exact   . Alcohol use 0.6 oz/week    1 Glasses of wine per week     Comment:  One drink a week.  . Drug use: No  . Sexual activity: Not Asked   Other Topics Concern  . None   Social History Narrative   Patient is married Gina Fritz) and lives at home with her husband.   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   Caffeine- Six cups of coffee daily and six cups of coke cola daily.          Vitals:   10/19/16 1007  BP: 102/70  Pulse: 65  Resp: 12    O2 sat at RA 96% Body mass index is 24.33 kg/m. Wt Readings from Last 3 Encounters:  10/19/16 133 lb (60.3 kg)  09/26/16 130 lb (59 kg)  09/01/16 129 lb 6.4 oz (58.7 kg)   05/26/16 : 129 Lb.   Physical Exam  Nursing note and vitals reviewed. Constitutional: She is oriented to person, place, and time. She appears well-developed and well-nourished. No distress.  HENT:  Head: Atraumatic.  Mouth/Throat: Oropharynx is clear and moist and mucous membranes are normal.  Eyes: Pupils are equal, round, and reactive to light. Conjunctivae and EOM are normal.  Cardiovascular: Normal rate and regular rhythm.   No murmur heard. Pulses:      Dorsalis pedis pulses are 2+ on the right side, and 2+ on the left side.  Respiratory: Effort normal and breath sounds normal. No respiratory distress.  GI: Soft. She exhibits no mass. There is no hepatomegaly. There is no tenderness.  Musculoskeletal: She exhibits no edema or tenderness.       Cervical back: She exhibits decreased range of motion. She exhibits no tenderness and no bony tenderness.       Lumbar back: She exhibits decreased range of motion. She exhibits no bony tenderness.  No pain elicited with cervical ROM. Kyphosis. Tenderness upon palpation of paraspinal lumbar muscles R>L and pain with movement on exam table. Left SI joint moderate pain with palpation.    Lymphadenopathy:    She has no cervical adenopathy.  Neurological: She is alert and oriented to person, place,  and time. She has normal strength. Coordination  normal.  Stable gait assisted with a cane.   Skin: Skin is warm. No erythema.  Psychiatric: She has a normal mood and affect.  Fairly groomed, good eye contact.    ASSESSMENT AND PLAN:   Gina Fritz was seen today for follow-up.  Diagnoses and all orders for this visit:    Chemistry      Component Value Date/Time   NA 142 10/19/2016 1105   K 3.9 10/19/2016 1105   CL 104 10/19/2016 1105   CO2 30 10/19/2016 1105   BUN 12 10/19/2016 1105   CREATININE 0.84 10/19/2016 1105      Component Value Date/Time   CALCIUM 9.4 10/19/2016 1105   ALKPHOS 136 (H) 10/19/2016 1105   AST 20 10/19/2016 1105   ALT 15 10/19/2016 1105   BILITOT 0.5 10/19/2016 1105      Gastroesophageal reflux disease, esophagitis presence not specified  GERD precautions discussed. Episode she describes could be certainly be caused by GERD. Continue Zantac and Omeprazole.  Essential hypertension, benign  BP's on lower normal range and some low BP's during pulmonary PT. Decrease Amlodipine from 100 mg to 5 mg daily. No changes in rest. Low salt diet. Eye exam current. Fall precautions.  Monitor BP at home. F/U in 4 months.  -     amLODipine (NORVASC) 5 MG tablet; Take 1 tablet (5 mg total) by mouth daily. -     Comprehensive metabolic panel  Bilateral low back pain without sciatica, unspecified chronicity  She also has pain on SI joint today, she is not interested in ortho referral. Topical Asper cream with lidocaine on area may help, she has not used any OTC topical treatment. Acetaminophen is preferable, 500 mg tid as needed. Also improving posture may help with pain as well as avoidance of wt gain. Instructed about warning signs.  Elevated alkaline phosphatase level  Further recommendations will be given according to lab results.  -     Comprehensive metabolic panel -     Gamma GT  In regard to exercise program she is interested in staring, I will fill forms. I may help with balance and  fall prevention, instructed to be cautious and to do exercises as tolerated.  -Gina Fritz was advised to return sooner than planned today if new concerns arise.       Gina Renier G. Martinique, MD  [facility]. [place] office.

## 2016-10-19 ENCOUNTER — Other Ambulatory Visit: Payer: Self-pay | Admitting: Family Medicine

## 2016-10-19 ENCOUNTER — Ambulatory Visit (INDEPENDENT_AMBULATORY_CARE_PROVIDER_SITE_OTHER): Payer: Medicare Other | Admitting: Family Medicine

## 2016-10-19 ENCOUNTER — Encounter: Payer: Self-pay | Admitting: Family Medicine

## 2016-10-19 VITALS — BP 102/70 | HR 65 | Resp 12 | Ht 62.0 in | Wt 133.0 lb

## 2016-10-19 DIAGNOSIS — M545 Low back pain, unspecified: Secondary | ICD-10-CM

## 2016-10-19 DIAGNOSIS — R748 Abnormal levels of other serum enzymes: Secondary | ICD-10-CM

## 2016-10-19 DIAGNOSIS — K219 Gastro-esophageal reflux disease without esophagitis: Secondary | ICD-10-CM | POA: Diagnosis not present

## 2016-10-19 DIAGNOSIS — I10 Essential (primary) hypertension: Secondary | ICD-10-CM | POA: Diagnosis not present

## 2016-10-19 LAB — GAMMA GT: GGT: 17 U/L (ref 7–51)

## 2016-10-19 LAB — COMPREHENSIVE METABOLIC PANEL
ALK PHOS: 136 U/L — AB (ref 39–117)
ALT: 15 U/L (ref 0–35)
AST: 20 U/L (ref 0–37)
Albumin: 3.9 g/dL (ref 3.5–5.2)
BILIRUBIN TOTAL: 0.5 mg/dL (ref 0.2–1.2)
BUN: 12 mg/dL (ref 6–23)
CO2: 30 meq/L (ref 19–32)
CREATININE: 0.84 mg/dL (ref 0.40–1.20)
Calcium: 9.4 mg/dL (ref 8.4–10.5)
Chloride: 104 mEq/L (ref 96–112)
GFR: 70.2 mL/min (ref >60.00)
GLUCOSE: 78 mg/dL (ref 70–99)
Potassium: 3.9 mEq/L (ref 3.5–5.1)
Sodium: 142 mEq/L (ref 135–145)
TOTAL PROTEIN: 6.5 g/dL (ref 6.0–8.3)

## 2016-10-19 MED ORDER — AMLODIPINE BESYLATE 5 MG PO TABS: 5.0000 mg | ORAL_TABLET | Freq: Every day | ORAL | 1 refills | Status: AC

## 2016-10-19 NOTE — Patient Instructions (Addendum)
A few things to remember from today's visit:   Essential hypertension, benign - Plan: amLODipine (NORVASC) 5 MG tablet, Comprehensive metabolic panel  Bilateral low back pain without sciatica, unspecified chronicity  Elevated alkaline phosphatase level - Plan: Comprehensive metabolic panel, Gamma GT  Gastroesophageal reflux disease, esophagitis presence not specified Today Amlodipine decreased to 5 mg.   Over the counter Asper cream with Lidocaine on back.    Blood pressure goal for most people is less than 140/90. Some populations (older than 60) the goal is less than 150/90.  Most recent cardiologists' recommendations recommend blood pressure at or less than 130/80.   Elevated blood pressure increases the risk of strokes, heart and kidney disease, and eye problems. Regular physical activity and a healthy diet (DASH diet) usually help. Low salt diet. Take medications as instructed.  Caution with some over the counter medications as cold medications, dietary products (for weight loss), and Ibuprofen or Aleve (frequent use);all these medications could cause elevation of blood pressure.    Avoid foods that make your symptoms worse, for example coffee, chocolate,pepermeint,alcohol, and greasy food. Raising the head of your bed about 6 inches may help with nocturnal symptoms.  Avoid tobacco use. Weight loss (if you are overweight). Avoid lying down for 3 hours after eating.  Instead 3 large meals daily try small and more frequent meals during the day.    You should be evaluated immediately if bloody vomiting, bloody stools, black stools (like tar), difficulty swallowing, food gets stuck on the way down or choking when eating. Abnormal weight loss or severe abdominal pain.    Please be sure medication list is accurate. If a new problem present, please set up appointment sooner than planned today.

## 2016-10-20 ENCOUNTER — Encounter (HOSPITAL_COMMUNITY)
Admission: RE | Admit: 2016-10-20 | Discharge: 2016-10-20 | Disposition: A | Discharge status: Home / Self Care | Payer: Medicare Other | Source: Ambulatory Visit | Attending: Pulmonary Disease | Admitting: Pulmonary Disease

## 2016-10-24 ENCOUNTER — Telehealth: Payer: Self-pay | Admitting: Family Medicine

## 2016-10-24 NOTE — Telephone Encounter (Signed)
Pt would like blood work results returning [place] call

## 2016-10-25 ENCOUNTER — Encounter (HOSPITAL_COMMUNITY)
Admission: RE | Admit: 2016-10-25 | Discharge: 2016-10-25 | Disposition: A | Discharge status: Home / Self Care | Payer: Medicare Other | Source: Ambulatory Visit | Attending: Pulmonary Disease | Admitting: Pulmonary Disease

## 2016-10-25 NOTE — Telephone Encounter (Signed)
See lab results.  

## 2016-10-26 ENCOUNTER — Ambulatory Visit: Payer: Medicare Other | Admitting: Family Medicine

## 2016-10-27 ENCOUNTER — Encounter (HOSPITAL_COMMUNITY)
Admission: RE | Admit: 2016-10-27 | Discharge: 2016-10-27 | Disposition: A | Discharge status: Home / Self Care | Payer: Medicare Other | Source: Ambulatory Visit | Attending: Pulmonary Disease | Admitting: Pulmonary Disease

## 2016-10-27 ENCOUNTER — Encounter (INDEPENDENT_AMBULATORY_CARE_PROVIDER_SITE_OTHER): Payer: Medicare Other | Admitting: Ophthalmology

## 2016-10-27 DIAGNOSIS — I10 Essential (primary) hypertension: Secondary | ICD-10-CM | POA: Diagnosis not present

## 2016-10-27 DIAGNOSIS — H353122 Nonexudative age-related macular degeneration, left eye, intermediate dry stage: Secondary | ICD-10-CM | POA: Diagnosis not present

## 2016-10-27 DIAGNOSIS — H35033 Hypertensive retinopathy, bilateral: Secondary | ICD-10-CM

## 2016-10-27 DIAGNOSIS — H43813 Vitreous degeneration, bilateral: Secondary | ICD-10-CM

## 2016-10-27 DIAGNOSIS — H353211 Exudative age-related macular degeneration, right eye, with active choroidal neovascularization: Secondary | ICD-10-CM | POA: Diagnosis not present

## 2016-11-01 ENCOUNTER — Encounter (HOSPITAL_COMMUNITY): Payer: Medicare Other

## 2016-11-03 ENCOUNTER — Encounter (HOSPITAL_COMMUNITY)
Admission: RE | Admit: 2016-11-03 | Discharge: 2016-11-03 | Disposition: A | Discharge status: Home / Self Care | Payer: Medicare Other | Source: Ambulatory Visit | Attending: Pulmonary Disease | Admitting: Pulmonary Disease

## 2016-11-03 DIAGNOSIS — J449 Chronic obstructive pulmonary disease, unspecified: Secondary | ICD-10-CM | POA: Diagnosis present

## 2016-11-08 ENCOUNTER — Encounter (HOSPITAL_COMMUNITY)
Admission: RE | Admit: 2016-11-08 | Discharge: 2016-11-08 | Disposition: A | Discharge status: Home / Self Care | Payer: Medicare Other | Source: Ambulatory Visit | Attending: Pulmonary Disease | Admitting: Pulmonary Disease

## 2016-11-09 ENCOUNTER — Encounter: Payer: Self-pay | Admitting: Physical Therapy

## 2016-11-09 ENCOUNTER — Ambulatory Visit: Payer: Medicare Other | Attending: Family Medicine | Admitting: Physical Therapy

## 2016-11-09 DIAGNOSIS — M545 Low back pain: Secondary | ICD-10-CM | POA: Insufficient documentation

## 2016-11-09 DIAGNOSIS — Z9181 History of falling: Secondary | ICD-10-CM | POA: Diagnosis not present

## 2016-11-09 DIAGNOSIS — M6281 Muscle weakness (generalized): Secondary | ICD-10-CM

## 2016-11-09 DIAGNOSIS — G8929 Other chronic pain: Secondary | ICD-10-CM | POA: Diagnosis present

## 2016-11-09 DIAGNOSIS — R2689 Other abnormalities of gait and mobility: Secondary | ICD-10-CM

## 2016-11-09 DIAGNOSIS — R42 Dizziness and giddiness: Secondary | ICD-10-CM

## 2016-11-09 DIAGNOSIS — R2681 Unsteadiness on feet: Secondary | ICD-10-CM | POA: Diagnosis present

## 2016-11-10 ENCOUNTER — Other Ambulatory Visit: Payer: Self-pay | Admitting: Family Medicine

## 2016-11-10 ENCOUNTER — Encounter (HOSPITAL_COMMUNITY)
Admission: RE | Admit: 2016-11-10 | Discharge: 2016-11-10 | Disposition: A | Discharge status: Home / Self Care | Payer: Medicare Other | Source: Ambulatory Visit | Attending: Pulmonary Disease | Admitting: Pulmonary Disease

## 2016-11-10 DIAGNOSIS — E785 Hyperlipidemia, unspecified: Secondary | ICD-10-CM

## 2016-11-10 NOTE — Therapy (Signed)
[facility] [address] [place], [place], [address] Phone: [phone]   Fax:  [phone]  Physical Therapy Evaluation  Patient Details  Name: Gina Fritz MRN: [id number] Date of Birth: Feb 05, 1941 Referring Provider: Betty G Martinique, MD  Encounter Date: 11/09/2016      PT End of Session - 11/10/16 1055    Visit Number 1   Number of Visits 17   Date for PT Re-Evaluation 01/06/17   PT Start Time 1018   PT Stop Time 1102   PT Time Calculation (min) 44 min   Equipment Utilized During Treatment Gait belt   Activity Tolerance Patient tolerated treatment well   Behavior During Therapy [facility] for tasks assessed/performed      Past Medical History:  Diagnosis Date  . Allergic rhinitis   . Anxiety   . CAP (community acquired pneumonia)    dx 02-05-2015  . Colitis    dx 02-05-2015  . COPD with emphysema ([place])   . Depression   . Dizziness   . GERD (gastroesophageal reflux disease)   . History of chronic sinusitis   . History of kidney stones   . Hyperlipidemia   . Hypertension   . Irritable bowel syndrome   . Loss of weight   . Pulmonary nodules    benign and stable per CT  . Right ureteral stone   . Sensorineural hearing loss, unilateral   . Subjective tinnitus     Past Surgical History:  Procedure Laterality Date  . CATARACT EXTRACTION Left   . CYSTO/  LEFT URETEROSCOPIC STONE EXTRACTION/ STENT PLACEMENT  08-10-2009  . ECTOPIC PREGNANCY SURGERY     and Appendectomy  . NASAL SINUS SURGERY    . REMOVAL CYST RIGHT ARM  age 3  . TUBAL LIGATION      There were no vitals filed for this visit.       Subjective Assessment - 11/10/16 1047    Subjective Pt has been noticed loss of balance with mobility and reports she gets "lightheaded." Husband notes she has had 2-3 falls in the past six months. Patient limits leaving house/participating in community activities due to fear of falling and would like to  feel like she can go anywhere without worrying about falling or getting off balance. She is currently in pulmonary rehab and performs water exercises. She was referred to PT for falls prevention & gait/balance training.    Patient is accompained by: Family member  husband [name]   Pertinent History History of falls, syncope/dizziness, COPD w emphysema, anxiety/depression, HTN, IBS/colitis, hx of low back pain/artiritis, osteoporosis   Limitations Walking;Standing;House hold activities   Patient Stated Goals To be able to walk in home & community without fear of falling.    Currently in Pain? Other (Comment)  Pt denied pain, but when supine reported 3/10 pain with chronic low back that was relieved by placing hands in small of back            [facility] PT Assessment - 11/09/16 1015      Assessment   Medical Diagnosis Gait Abnormality, balance deficits   Referring Provider Betty G Martinique, MD   Onset Date/Surgical Date 11/04/16  MD office visit with PT referral   Hand Dominance Right   Prior Therapy no     Precautions   Precautions Fall   Precaution Comments Orthostatic hypotension     Restrictions   Weight Bearing Restrictions No     Balance Screen   Has  the patient fallen in the past 6 months Yes   How many times? 2 to 3  gets off balance   Has the patient had a decrease in activity level because of a fear of falling?  Yes   Is the patient reluctant to leave their home because of a fear of falling?  Yes     [place] residence   Living Arrangements Spouse/significant other   Type of [place] to enter   Entrance Stairs-Number of Steps 3   Entrance Stairs-Rails Right;Left;Can reach both   [place] One level   [place] - single point;Shower seat - built in;Grab bars - tub/shower  patient purchased cane     Prior Function   Level of Independence Requires assistive device for independence  Started using  cane about a year ago   Vocation Retired     Charity fundraiser Status Within Functional Limits for tasks assessed   Attention Focused   Focused Attention Appears intact   Memory Appears intact   Awareness Appears intact   Problem Solving --     Observation/Other Assessments   Focus on Therapeutic Outcomes (FOTO)  46.34   Other Surveys  Other Surveys   Activities of Balance Confidence Scale (ABC Scale)  5.6     Posture/Postural Control   Posture/Postural Control Postural limitations   Postural Limitations Rounded Shoulders;Forward head     ROM / Strength   AROM / PROM / Strength Strength     Strength   Strength Assessment Site Hip   Right/Left Hip Right;Left   Right Hip Extension 4/5  tested in standing with BUE support   Right Hip ABduction 4/5  tested in standing with BUE support   Left Hip Extension 4/5  tested in standing with BUE support   Left Hip ABduction 4/5  tested in standing with BUE support     Transfers   Transfers Sit to Stand;Stand to Sit   Sit to Stand 5: Supervision;With upper extremity assist;From chair/3-in-1;With armrests  uses back of legs against chair & cane support to stabilize   Stand to Sit 5: Supervision;With upper extremity assist;With armrests;To chair/3-in-1  uses back of legs against chair & cane for stability     Ambulation/Gait   Ambulation/Gait Yes   Ambulation/Gait Assistance 4: Min assist   Ambulation Distance (Feet) 90 Feet   Assistive device Straight cane  rubber star tip   Gait Pattern Step-through pattern;Decreased arm swing - left;Decreased stride length;Shuffle;Lateral hip instability;Trunk flexed   Ambulation Surface Level;Indoor   Gait velocity 1.68 ft/sec     Balance   Balance Assessed Yes     Static Standing Balance   Static Standing - Balance Support No upper extremity supported;Right upper extremity supported   Static Standing - Level of Assistance 4: Min assist;5: Stand by assistance  MinA / legs  against mat no device, SBA cane   Static Standing - Comment/# of Minutes 2 minutes     Dynamic Standing Balance   Dynamic Standing - Balance Support During functional activity;No upper extremity supported;Right upper extremity supported  RUE with cane for functional task   Dynamic Standing - Level of Assistance 4: Min assist;3: Mod assist  [place] without AD & MinA with cane   Dynamic Standing - Balance Activities Head turns;Reaching for objects   Dynamic Standing - Comments reaches 2" & looks to sides with cane with MinA  Standardized Balance Assessment   Standardized Balance Assessment Berg Balance Test;Timed Up and Go Test     Berg Balance Test   Sit to Stand Needs moderate or maximal assist to stand   Standing Unsupported Needs several tries to stand 30 seconds unsupported   Sitting with Back Unsupported but Feet Supported on Floor or Stool Able to sit safely and securely 2 minutes   Stand to Sit Needs assistance to sit   Transfers Able to transfer safely, definite need of hands   Standing Unsupported with Eyes Closed Needs help to keep from falling   Standing Ubsupported with Feet Together Needs help to attain position and unable to hold for 15 seconds   From Standing, Reach Forward with Outstretched Arm Loses balance while trying/requires external support   From Standing Position, Pick up Object from Floor Unable to try/needs assist to keep balance   From Standing Position, Turn to Look Behind Over each Shoulder Needs supervision when turning   Turn 360 Degrees Needs assistance while turning   Standing Unsupported, Alternately Place Feet on Step/Stool Needs assistance to keep from falling or unable to try   Standing Unsupported, One Foot in Front Loses balance while stepping or standing   Standing on One Leg Unable to try or needs assist to prevent fall   Total Score 9   Berg comment: Score <40 indicates high risk of falls; Score <36 indicates dependency in standing ADLs      Timed Up and Go Test   TUG Normal TUG;Cognitive TUG   Normal TUG (seconds) 19.23  19.23 w cane; 17.24s without   Cognitive TUG (seconds) 16.04  with cane   TUG Comments >13.5sec on std TUG & >15.5sec cogitive indicates fall risk            Vestibular Assessment - 11/09/16 1015      Orthostatics   BP supine (x 5 minutes) 147/75   HR supine (x 5 minutes) 53  O2 99%   BP standing (after 1 minute) 111/66   HR standing (after 1 minute) 65  O2 96%   BP standing (after 3 minutes) 132/74   HR standing (after 3 minutes) 60  O2 96%   Orthostatics Comment Positive for orthostatic hypotension        Objective measurements completed on examination: See above findings.                  PT Education - 11/10/16 1054    Education provided Yes   Education Details clinical findings, PT plan of care   Person(s) Educated Patient;Spouse   Methods Explanation   Comprehension Verbalized understanding          PT Short Term Goals - 11/10/16 1302      PT SHORT TERM GOAL #1   Title Pt will ambulate 150' with supervision and LRAD to improve ambulation and exhibit increased endurance   Baseline --   Time 4   Period Weeks   Status New   Target Date 12/09/16     PT SHORT TERM GOAL #2   Title Patient will perform sit to/from stand transfers from chairs with armrests with external support to stabilize with supervision.    Baseline --   Time 4   Period Weeks   Status New   Target Date 12/09/16     PT SHORT TERM GOAL #3   Title Pt will stand statically for 30 seconds without UE support with supervision to improve functional independence   Baseline --  Time 4   Period Weeks   Status New   Target Date 12/09/16     PT SHORT TERM GOAL #4   Title Pt will reach forward at least 5" & to floor with  UE assist with supervision to improve balance during functional activites   Baseline --   Time 4   Period Weeks   Status New   Target Date 12/09/16     PT SHORT TERM GOAL  #5   Title Patient & husband verbalize & demonstrate understanding of initial HEP.    Time 4   Period Weeks   Status New   Target Date 12/09/16           PT Long Term Goals - 11/10/16 1300      PT LONG TERM GOAL #1   Title Pt will improve ABC score to 15.6%   Baseline 5.6% on 11/10/16   Time 8   Period Weeks   Status New   Target Date 01/06/17     PT LONG TERM GOAL #2   Title Pt will ambulate 300' with supervision from husband & LRAD to improve endurance and improve community ambulation   Time 8   Period Weeks   Status New   Target Date 01/06/17     PT LONG TERM GOAL #3   Title Pt will improve Berg score to >/= 20/56 to reduce risk of falls   Baseline 9/56 on 11/10/16   Time 8   Period Weeks   Status New   Target Date 01/06/17     PT LONG TERM GOAL #4   Title Pt will negotiate ramps & curbs with LRAD with supervision from husband to improve community ambulation   Time 8   Period Weeks   Status New   Target Date 01/06/17     PT LONG TERM GOAL #5   Title Patient ambulates 76' around furniture with LRAD modified independent to enable safe household mobility.    Time 8   Period Weeks   Status New   Target Date 01/06/17     Additional Long Term Goals   Additional Long Term Goals Yes     PT LONG TERM GOAL #6   Title Patient & husband verbalize & demonstrate understanding of ongoing HEP / fitness plan.    Time 8   Period Weeks   Status New   Target Date 01/06/17                Plan - 11/10/16 1145    Clinical Impression Statement Pt is a 76 year old female with COPD/emphysema who presents to OPPT with balance deficits that hinder her ability to safely and independently ambulate in her home & community and participate in pulmonary rehab. She reports started using a cane one year ago. She has deconditioning with generalized weakness and decreased endurance. Her oxygen saturation rate on room air was stable at >/= 95% with all activities. Her balance is  impaired with high fall risk as noted by Merrilee Jansky Balance score of 9/56. She needs external support for all standing ADLs, reaching & scanning for stability & safety. Her Timed Up & Go of 19.23sec indicates high fall risk also. Orthostatic Hypotension testing was positive with symptoms & drop in both systolic and diastolic blood pressures upon standing. Patient's gait is unsafe with assistance for balance, gait velocity of 1.16ft/sec & deficits indicating fall risk. Pt would benefit from skilled PT to address these impairments and functional limitations to maximize functional  mobility, independence and reduce falls risk.   History and Personal Factors relevant to plan of care: History of falls, syncope/dizziness, COPD w emphysema, anxiety/depression, HTN, IBS/colitis, hx of low back pain/artiritis, osteoporosis   Clinical Presentation Evolving   Clinical Presentation due to: Orthostatic hypotension, COPD w emphysema, low back pain, generalized deconditioned   Clinical Decision Making Moderate   Rehab Potential Good   PT Frequency 2x / week   PT Duration 8 weeks   PT Treatment/Interventions ADLs/Self Care Home Management;Neuromuscular re-education;Balance training;Therapeutic exercise;Therapeutic activities;Functional mobility training;Stair training;Gait training;DME Instruction;Patient/family education;Energy conservation   PT Next Visit Plan assess gait with rollator & std RW, Initiate HEP for balance,    Consulted and Agree with Plan of Care Patient;Family member/caregiver   Family Member Consulted Husband, [name]      Patient will benefit from skilled therapeutic intervention in order to improve the following deficits and impairments:  Abnormal gait, Cardiopulmonary status limiting activity, Decreased activity tolerance, Decreased balance, Decreased mobility, Decreased endurance, Decreased strength, Difficulty walking, Dizziness, Postural dysfunction  Visit Diagnosis: History of falling  Other  abnormalities of gait and mobility  Unsteadiness on feet  Dizziness and giddiness  Muscle weakness (generalized)  Chronic midline low back pain without sciatica      G-Codes - 11/24/16 1627    Functional Assessment Tool Used (Outpatient Only) Sit to/from stand requires back of legs against chair & external support for stabilization. Static stance for 30 sec requires minA & multiple attempts.    Functional Limitation Changing and maintaining body position   Changing and Maintaining Body Position Current Status [phone]) At least 80 percent but less than 100 percent impaired, limited or restricted   Changing and Maintaining Body Position Goal Status ([id number]) At least 60 percent but less than 80 percent impaired, limited or restricted       Problem List Patient Active Problem List   Diagnosis Date Noted  . GERD (gastroesophageal reflux disease) 10/19/2016  . Rectal sphincter spasm 05/26/2016  . Overactive bladder-urinary frequency 05/26/2016  . Bilateral low back pain 04/27/2016  . Generalized anxiety disorder 04/27/2016  . Hyperlipidemia 02/24/2016  . Osteoporosis 02/24/2016  . Essential hypertension, benign 12/18/2015  . Hypertensive urgency 11/14/2015  . COPD with emphysema ([place]) 05/22/2015  . COPD with asthma ([place]) 05/22/2015  . Malnutrition of moderate degree 02/09/2015  . Abdominal pain 02/05/2015  . Kidney stone on right side 02/05/2015  . Colitis 02/05/2015  . Unstable gait 04/22/2013   Gershon Crane, SPT 11/10/2016, 1:24 PM  Jamey Reas, PT, DPT 11/10/2016, 4:37 PM  [facility] [address] [place], [place], [address] Phone: [phone]   Fax:  [phone]  Name: Gina Fritz MRN: [id number] Date of Birth: 1941/03/06

## 2016-11-15 ENCOUNTER — Other Ambulatory Visit: Payer: Self-pay | Admitting: Pulmonary Disease

## 2016-11-15 ENCOUNTER — Encounter (HOSPITAL_COMMUNITY)
Admission: RE | Admit: 2016-11-15 | Discharge: 2016-11-15 | Disposition: A | Discharge status: Home / Self Care | Payer: Medicare Other | Source: Ambulatory Visit | Attending: Pulmonary Disease | Admitting: Pulmonary Disease

## 2016-11-16 ENCOUNTER — Other Ambulatory Visit: Payer: Self-pay | Admitting: Family Medicine

## 2016-11-16 DIAGNOSIS — R519 Headache, unspecified: Secondary | ICD-10-CM

## 2016-11-16 DIAGNOSIS — R51 Headache: Principal | ICD-10-CM

## 2016-11-17 ENCOUNTER — Encounter (HOSPITAL_COMMUNITY)
Admission: RE | Admit: 2016-11-17 | Discharge: 2016-11-17 | Disposition: A | Discharge status: Home / Self Care | Payer: Medicare Other | Source: Ambulatory Visit | Attending: Pulmonary Disease | Admitting: Pulmonary Disease

## 2016-11-19 ENCOUNTER — Other Ambulatory Visit: Payer: Self-pay | Admitting: Pulmonary Disease

## 2016-11-21 ENCOUNTER — Ambulatory Visit: Payer: Medicare Other | Admitting: Physical Therapy

## 2016-11-21 ENCOUNTER — Encounter: Payer: Self-pay | Admitting: Physical Therapy

## 2016-11-21 DIAGNOSIS — R2689 Other abnormalities of gait and mobility: Secondary | ICD-10-CM

## 2016-11-21 DIAGNOSIS — Z9181 History of falling: Secondary | ICD-10-CM

## 2016-11-21 DIAGNOSIS — R42 Dizziness and giddiness: Secondary | ICD-10-CM

## 2016-11-21 DIAGNOSIS — R2681 Unsteadiness on feet: Secondary | ICD-10-CM

## 2016-11-21 DIAGNOSIS — M6281 Muscle weakness (generalized): Secondary | ICD-10-CM

## 2016-11-21 NOTE — Patient Instructions (Addendum)
Perform these exercises for strengthening: Functional Quadriceps: Sit to Stand    Sit on edge of chair, feet flat on floor, arms crossed over chest or on hips as shown. Stand upright, extending knees fully. Repeat __10__ times per set. Do __1__ sets per session. Do __1__ sessions per day.  http://orth.exer.us/735   Copyright  VHI. All rights reserved.    Functional Quadriceps: Chair Squat    Keeping feet flat on floor, shoulder width apart, squat as low as is comfortable. "stick your booty out" and then come back up to tall posture. Hold onto something steady for balance- chair back or kitchen counter. Repeat _10_ times per set. Do _1_ sets per session. Do _1_ sessions per day.  http://orth.exer.us/737   Copyright  VHI. All rights reserved.   Perform these exercises for balance:   Single Leg - Eyes Open    Holding support, lift right leg while maintaining balance over other leg. Progress to removing hands from support surface for longer periods of time. Hold_10_ seconds.  Switch legs, standing on right leg and lifting left foot up as pictured. Work on decreasing support on counter as able. Hold for 10 seconds.  Repeat _3_ times each leg per session. Do __1__ sessions per day.  Copyright  VHI. All rights reserved.     Feet Apart, Varied Arm Positions - Eyes Closed    In a corner with a chair in front of you for safety: Stand with feet shoulder width apart and arms at sides. Close eyes and visualize upright position. Hold __20__ seconds. Repeat __3__ times per session. Do __1-2__ sessions per day.  Copyright  VHI. All rights reserved.

## 2016-11-22 ENCOUNTER — Encounter (HOSPITAL_COMMUNITY)
Admission: RE | Admit: 2016-11-22 | Discharge: 2016-11-22 | Disposition: A | Discharge status: Home / Self Care | Payer: Medicare Other | Source: Ambulatory Visit | Attending: Pulmonary Disease | Admitting: Pulmonary Disease

## 2016-11-22 NOTE — Therapy (Signed)
[facility] [address] [place], [place], [address] Phone: [phone]   Fax:  [phone]  Physical Therapy Treatment  Patient Details  Name: Gina Fritz MRN: [id number] Date of Birth: 08/25/1940 Referring Provider: Betty G Martinique, MD  Encounter Date: 11/21/2016   11/21/16 1324  PT Visits / Re-Eval  Visit Number 2  Number of Visits 17  Date for PT Re-Evaluation 01/06/17  PT Time Calculation  PT Start Time 1318  PT Stop Time 1400  PT Time Calculation (min) 42 min  PT - End of Session  Equipment Utilized During Treatment Gait belt  Activity Tolerance Patient tolerated treatment well  Behavior During Therapy [facility] for tasks assessed/performed     Past Medical History:  Diagnosis Date  . Allergic rhinitis   . Anxiety   . CAP (community acquired pneumonia)    dx 02-05-2015  . Colitis    dx 02-05-2015  . COPD with emphysema ([place])   . Depression   . Dizziness   . GERD (gastroesophageal reflux disease)   . History of chronic sinusitis   . History of kidney stones   . Hyperlipidemia   . Hypertension   . Irritable bowel syndrome   . Loss of weight   . Pulmonary nodules    benign and stable per CT  . Right ureteral stone   . Sensorineural hearing loss, unilateral   . Subjective tinnitus     Past Surgical History:  Procedure Laterality Date  . CATARACT EXTRACTION Left   . CYSTO/  LEFT URETEROSCOPIC STONE EXTRACTION/ STENT PLACEMENT  08-10-2009  . ECTOPIC PREGNANCY SURGERY     and Appendectomy  . NASAL SINUS SURGERY    . REMOVAL CYST RIGHT ARM  age 41  . TUBAL LIGATION      There were no vitals filed for this visit.     11/21/16 1322  Symptoms/Limitations  Subjective No new complaints. No falls to report.   Patient is accompained by: Family member (spouse, [name], in lobby)  Pertinent History History of falls, syncope/dizziness, COPD w emphysema, anxiety/depression, HTN, IBS/colitis, hx of  low back pain/artiritis, osteoporosis  Limitations Walking;Standing;House hold activities  Patient Stated Goals To be able to walk in home & community without fear of falling.   Pain Assessment  Currently in Pain? No/denies   Treatment: Issued the following to pt's HEP today with cues on ex form and technique. Min guard assist with balance ex's.  Perform these exercises for strengthening: Functional Quadriceps: Sit to Stand    Sit on edge of chair, feet flat on floor, arms crossed over chest or on hips as shown. Stand upright, extending knees fully. Repeat __10__ times per set. Do __1__ sets per session. Do __1__ sessions per day.  http://orth.exer.us/735   Copyright  VHI. All rights reserved.    Functional Quadriceps: Chair Squat    Keeping feet flat on floor, shoulder width apart, squat as low as is comfortable. "stick your booty out" and then come back up to tall posture. Hold onto something steady for balance- chair back or kitchen counter. Repeat _10_ times per set. Do _1_ sets per session. Do _1_ sessions per day.  http://orth.exer.us/737   Copyright  VHI. All rights reserved.   Perform these exercises for balance:   Single Leg - Eyes Open    Holding support, lift right leg while maintaining balance over other leg. Progress to removing hands from support surface for longer periods of time. Hold_10_ seconds.  Switch legs,  standing on right leg and lifting left foot up as pictured. Work on decreasing support on counter as able. Hold for 10 seconds.  Repeat _3_ times each leg per session. Do __1__ sessions per day.  Copyright  VHI. All rights reserved.     Feet Apart, Varied Arm Positions - Eyes Closed    In a corner with a chair in front of you for safety: Stand with feet shoulder width apart and arms at sides. Close eyes and visualize upright position. Hold __20__ seconds. Repeat __3__ times per session. Do __1-2__ sessions per day.  Copyright  VHI.  All rights reserved.       11/21/16 1332  PT Education  Education provided Yes  Education Details use of garden stool for return to gardening without getting stuck on ground; possible use of RW or rollator for energy conservation with community distance;  HEP for strengthening and balance  Person(s) Educated Patient;Spouse  Methods Explanation;Demonstration;Verbal cues;Handout  Comprehension Verbalized understanding;Returned demonstration;Need further instruction          PT Short Term Goals - 11/10/16 1302      PT SHORT TERM GOAL #1   Title Pt will ambulate 150' with supervision and LRAD to improve ambulation and exhibit increased endurance   Baseline --   Time 4   Period Weeks   Status New   Target Date 12/09/16     PT SHORT TERM GOAL #2   Title Patient will perform sit to/from stand transfers from chairs with armrests with external support to stabilize with supervision.    Baseline --   Time 4   Period Weeks   Status New   Target Date 12/09/16     PT SHORT TERM GOAL #3   Title Pt will stand statically for 30 seconds without UE support with supervision to improve functional independence   Baseline --   Time 4   Period Weeks   Status New   Target Date 12/09/16     PT SHORT TERM GOAL #4   Title Pt will reach forward at least 5" & to floor with  UE assist with supervision to improve balance during functional activites   Baseline --   Time 4   Period Weeks   Status New   Target Date 12/09/16     PT SHORT TERM GOAL #5   Title Patient & husband verbalize & demonstrate understanding of initial HEP.    Time 4   Period Weeks   Status New   Target Date 12/09/16           PT Long Term Goals - 11/10/16 1300      PT LONG TERM GOAL #1   Title Pt will improve ABC score to 15.6%   Baseline 5.6% on 11/10/16   Time 8   Period Weeks   Status New   Target Date 01/06/17     PT LONG TERM GOAL #2   Title Pt will ambulate 300' with supervision from husband & LRAD to  improve endurance and improve community ambulation   Time 8   Period Weeks   Status New   Target Date 01/06/17     PT LONG TERM GOAL #3   Title Pt will improve Berg score to >/= 20/56 to reduce risk of falls   Baseline 9/56 on 11/10/16   Time 8   Period Weeks   Status New   Target Date 01/06/17     PT LONG TERM GOAL #4   Title Pt  will negotiate ramps & curbs with LRAD with supervision from husband to improve community ambulation   Time 8   Period Weeks   Status New   Target Date 01/06/17     PT LONG TERM GOAL #5   Title Patient ambulates 89' around furniture with LRAD modified independent to enable safe household mobility.    Time 8   Period Weeks   Status New   Target Date 01/06/17     Additional Long Term Goals   Additional Long Term Goals Yes     PT LONG TERM GOAL #6   Title Patient & husband verbalize & demonstrate understanding of ongoing HEP / fitness plan.    Time 8   Period Weeks   Status New   Target Date 01/06/17        11/21/16 1326  Plan  Clinical Impression Statement Today's skilled session focused on establishment of an HEP for strengthening and balance. Pt was able to perform HEP in session without issues. Also initiated conversation on use of RW or rollator for community gait for energy conservaiton/to allow pt to go further. Pt agreed to consider this, however deferred trial of one today. Also edcuated pt on use of garden stool after she reported getting stuck on ground while pulling weeds from garden and having to wait on her husband to help her up. Pt is making steady progress toward goals and should benefit from continued PT.   Pt will benefit from skilled therapeutic intervention in order to improve on the following deficits Abnormal gait;Cardiopulmonary status limiting activity;Decreased activity tolerance;Decreased balance;Decreased mobility;Decreased endurance;Decreased strength;Difficulty walking;Dizziness;Postural dysfunction  Rehab Potential Good    PT Frequency 2x / week  PT Duration 8 weeks  PT Treatment/Interventions ADLs/Self Care Home Management;Neuromuscular re-education;Balance training;Therapeutic exercise;Therapeutic activities;Functional mobility training;Stair training;Gait training;DME Instruction;Patient/family education;Energy conservation  PT Next Visit Plan continue to work on LE strengthening and balance; readdress gait with RW/rollator in a few visits if pt agreeable.  Consulted and Agree with Plan of Care Patient;Family member/caregiver  Family Member Consulted Husband, [name]     Patient will benefit from skilled therapeutic intervention in order to improve the following deficits and impairments:  Abnormal gait, Cardiopulmonary status limiting activity, Decreased activity tolerance, Decreased balance, Decreased mobility, Decreased endurance, Decreased strength, Difficulty walking, Dizziness, Postural dysfunction  Visit Diagnosis: History of falling  Other abnormalities of gait and mobility  Unsteadiness on feet  Dizziness and giddiness  Muscle weakness (generalized)     Problem List Patient Active Problem List   Diagnosis Date Noted  . GERD (gastroesophageal reflux disease) 10/19/2016  . Rectal sphincter spasm 05/26/2016  . Overactive bladder-urinary frequency 05/26/2016  . Bilateral low back pain 04/27/2016  . Generalized anxiety disorder 04/27/2016  . Hyperlipidemia 02/24/2016  . Osteoporosis 02/24/2016  . Essential hypertension, benign 12/18/2015  . Hypertensive urgency 11/14/2015  . COPD with emphysema ([place]) 05/22/2015  . COPD with asthma ([place]) 05/22/2015  . Malnutrition of moderate degree 02/09/2015  . Abdominal pain 02/05/2015  . Kidney stone on right side 02/05/2015  . Colitis 02/05/2015  . Unstable gait 04/22/2013    Willow Ora, PTA, [facility] [address], [place], [place] [address] [phone] 11/22/16, 10:43 PM   Name: Gina Fritz MRN:  [id number] Date of Birth: 1940/08/02

## 2016-11-23 ENCOUNTER — Ambulatory Visit: Payer: Medicare Other | Admitting: Physical Therapy

## 2016-11-23 ENCOUNTER — Encounter: Payer: Self-pay | Admitting: Physical Therapy

## 2016-11-23 DIAGNOSIS — R2689 Other abnormalities of gait and mobility: Secondary | ICD-10-CM

## 2016-11-23 DIAGNOSIS — M6281 Muscle weakness (generalized): Secondary | ICD-10-CM

## 2016-11-23 DIAGNOSIS — Z9181 History of falling: Secondary | ICD-10-CM

## 2016-11-23 DIAGNOSIS — R2681 Unsteadiness on feet: Secondary | ICD-10-CM

## 2016-11-23 DIAGNOSIS — R42 Dizziness and giddiness: Secondary | ICD-10-CM

## 2016-11-24 ENCOUNTER — Encounter (INDEPENDENT_AMBULATORY_CARE_PROVIDER_SITE_OTHER): Payer: Medicare Other | Admitting: Ophthalmology

## 2016-11-24 ENCOUNTER — Encounter (HOSPITAL_COMMUNITY)
Admission: RE | Admit: 2016-11-24 | Discharge: 2016-11-24 | Disposition: A | Discharge status: Home / Self Care | Payer: Medicare Other | Source: Ambulatory Visit | Attending: Pulmonary Disease | Admitting: Pulmonary Disease

## 2016-11-24 DIAGNOSIS — I10 Essential (primary) hypertension: Secondary | ICD-10-CM | POA: Diagnosis not present

## 2016-11-24 DIAGNOSIS — H43813 Vitreous degeneration, bilateral: Secondary | ICD-10-CM | POA: Diagnosis not present

## 2016-11-24 DIAGNOSIS — H35033 Hypertensive retinopathy, bilateral: Secondary | ICD-10-CM | POA: Diagnosis not present

## 2016-11-24 DIAGNOSIS — H353211 Exudative age-related macular degeneration, right eye, with active choroidal neovascularization: Secondary | ICD-10-CM | POA: Diagnosis not present

## 2016-11-24 DIAGNOSIS — H353122 Nonexudative age-related macular degeneration, left eye, intermediate dry stage: Secondary | ICD-10-CM

## 2016-11-24 NOTE — Therapy (Signed)
[facility] [address] [place], [place], [address] Phone: [phone]   Fax:  [phone]  Physical Therapy Treatment  Patient Details  Name: Gina Fritz MRN: [id number] Date of Birth: [birth year] Referring Provider: Betty G Martinique, MD  Encounter Date: 11/23/2016      PT End of Session - 11/23/16 2028    Visit Number 3   Number of Visits 17   Date for PT Re-Evaluation 01/06/17   PT Start Time 1450   PT Stop Time 1530   PT Time Calculation (min) 40 min   Equipment Utilized During Treatment Gait belt   Activity Tolerance Patient tolerated treatment well   Behavior During Therapy [facility] for tasks assessed/performed      Past Medical History:  Diagnosis Date  . Allergic rhinitis   . Anxiety   . CAP (community acquired pneumonia)    dx 02-05-2015  . Colitis    dx 02-05-2015  . COPD with emphysema ([place])   . Depression   . Dizziness   . GERD (gastroesophageal reflux disease)   . History of chronic sinusitis   . History of kidney stones   . Hyperlipidemia   . Hypertension   . Irritable bowel syndrome   . Loss of weight   . Pulmonary nodules    benign and stable per CT  . Right ureteral stone   . Sensorineural hearing loss, unilateral   . Subjective tinnitus     Past Surgical History:  Procedure Laterality Date  . CATARACT EXTRACTION Left   . CYSTO/  LEFT URETEROSCOPIC STONE EXTRACTION/ STENT PLACEMENT  08-10-2009  . ECTOPIC PREGNANCY SURGERY     and Appendectomy  . NASAL SINUS SURGERY    . REMOVAL CYST RIGHT ARM  age 76  . TUBAL LIGATION      There were no vitals filed for this visit.      Subjective Assessment - 11/23/16 1452    Subjective No falls. She has been doing the exercises that PT taught her.    Patient is accompained by: Family member   Pertinent History History of falls, syncope/dizziness, COPD w emphysema, anxiety/depression, HTN, IBS/colitis, hx of low back pain/artiritis,  osteoporosis   Limitations Walking;Standing;House hold activities   Patient Stated Goals To be able to walk in home & community without fear of falling.    Currently in Pain? No/denies                         [facility] Adult PT Treatment/Exercise - 11/23/16 2030      Transfers   Transfers Sit to Stand;Stand to [employer] Transfers   Sit to Stand 5: Supervision;With upper extremity assist;With armrests;From chair/3-in-1  to rollator walker   Sit to Stand Details Visual cues for safe use of DME/AE;Verbal cues for technique;Verbal cues for safe use of DME/AE   Stand to Sit 5: Supervision;With upper extremity assist;With armrests;To chair/3-in-1  from rollator walker   Stand to Sit Details (indicate cue type and reason) Visual cues for safe use of DME/AE;Verbal cues for sequencing;Verbal cues for safe use of DME/AE   Stand Pivot Transfers 5: Supervision;With armrests  turning 180* to sit/stand from rollator sear   Stand Pivot Transfer Details (indicate cue type and reason) verbal & vsiual cues on technique on safety with rollator     Ambulation/Gait   Ambulation/Gait Yes   Ambulation/Gait Assistance 5: Supervision   Ambulation/Gait Assistance Details verbal, demo & tactile cues on  technique with rollator walker.    Ambulation Distance (Feet) 250 Feet   Assistive device Rollator   Gait Pattern Step-through pattern;Decreased arm swing - left;Decreased stride length;Shuffle;Lateral hip instability;Trunk flexed   Ambulation Surface Indoor;Level   Ramp 4: Min assist  rollator walker    Ramp Details (indicate cue type and reason) demo & verbal cues prior and verbal & manual cues during on proper technique with rollator walker.    Curb 4: Min assist   Curb Details (indicate cue type and reason) demo & verbal cues prior and verbal & manual cues during on proper technique with rollator walker.     Self-Care   Self-Care ADL's   ADL's use of rollator walker for transporting  items like laundry basket. Sitting on rollator seat for reaching to ground like weeding.                  PT Education - 11/23/16 1450    Education provided Yes   Education Details benefits & safety with rollator walker use. Difference in designs of rollator walkers.   Person(s) Educated Patient   Methods Explanation;Demonstration;Verbal cues;Other (comment)  internet for differences in designs.   Comprehension Verbalized understanding;Need further instruction          PT Short Term Goals - 11/23/16 2028      PT SHORT TERM GOAL #1   Title Pt will ambulate 150' with supervision and LRAD to improve ambulation and exhibit increased endurance   Time 4   Period Weeks   Status On-going   Target Date 12/09/16     PT SHORT TERM GOAL #2   Title Patient will perform sit to/from stand transfers from chairs with armrests with external support to stabilize with supervision.    Time 4   Period Weeks   Status On-going   Target Date 12/09/16     PT SHORT TERM GOAL #3   Title Pt will stand statically for 30 seconds without UE support with supervision to improve functional independence   Time 4   Period Weeks   Status On-going   Target Date 12/09/16     PT SHORT TERM GOAL #4   Title Pt will reach forward at least 5" & to floor with  UE assist with supervision to improve balance during functional activites   Time 4   Period Weeks   Status On-going   Target Date 12/09/16     PT SHORT TERM GOAL #5   Title Patient & husband verbalize & demonstrate understanding of initial HEP.    Time 4   Period Weeks   Status On-going   Target Date 12/09/16           PT Long Term Goals - 11/23/16 2029      PT LONG TERM GOAL #1   Title Pt will improve ABC score to 15.6%   Baseline 5.6% on 11/10/16   Time 8   Period Weeks   Status On-going   Target Date 01/06/17     PT LONG TERM GOAL #2   Title Pt will ambulate 300' with supervision from husband & LRAD to improve endurance and  improve community ambulation   Time 8   Period Weeks   Status On-going   Target Date 01/06/17     PT LONG TERM GOAL #3   Title Pt will improve Berg score to >/= 20/56 to reduce risk of falls   Baseline 9/56 on 11/10/16   Time 8   Period Weeks  Status On-going   Target Date 01/06/17     PT LONG TERM GOAL #4   Title Pt will negotiate ramps & curbs with LRAD with supervision from husband to improve community ambulation   Time 8   Period Weeks   Status On-going   Target Date 01/06/17     PT LONG TERM GOAL #5   Title Patient ambulates 79' around furniture with LRAD modified independent to enable safe household mobility.    Time 8   Period Weeks   Status On-going   Target Date 01/06/17     PT LONG TERM GOAL #6   Title Patient & husband verbalize & demonstrate understanding of ongoing HEP / fitness plan.    Time 8   Period Weeks   Status On-going   Target Date 01/06/17               Plan - 11/23/16 2039    Clinical Impression Statement Patient would benefit from use of rollator walker for improved stability. She was able to ambulate further distance with less assistance and greater safety.    Rehab Potential Good   PT Frequency 2x / week   PT Duration 8 weeks   PT Treatment/Interventions ADLs/Self Care Home Management;Neuromuscular re-education;Balance training;Therapeutic exercise;Therapeutic activities;Functional mobility training;Stair training;Gait training;DME Instruction;Patient/family education;Energy conservation   PT Next Visit Plan continue to work on LE strengthening and balance;  gait with rollator including ramps & curbs, outside if weather permits.    Consulted and Agree with Plan of Care Patient      Patient will benefit from skilled therapeutic intervention in order to improve the following deficits and impairments:  Abnormal gait, Cardiopulmonary status limiting activity, Decreased activity tolerance, Decreased balance, Decreased mobility, Decreased  endurance, Decreased strength, Difficulty walking, Dizziness, Postural dysfunction  Visit Diagnosis: History of falling  Other abnormalities of gait and mobility  Unsteadiness on feet  Dizziness and giddiness  Muscle weakness (generalized)     Problem List Patient Active Problem List   Diagnosis Date Noted  . GERD (gastroesophageal reflux disease) 10/19/2016  . Rectal sphincter spasm 05/26/2016  . Overactive bladder-urinary frequency 05/26/2016  . Bilateral low back pain 04/27/2016  . Generalized anxiety disorder 04/27/2016  . Hyperlipidemia 02/24/2016  . Osteoporosis 02/24/2016  . Essential hypertension, benign 12/18/2015  . Hypertensive urgency 11/14/2015  . COPD with emphysema ([place]) 05/22/2015  . COPD with asthma ([place]) 05/22/2015  . Malnutrition of moderate degree 02/09/2015  . Abdominal pain 02/05/2015  . Kidney stone on right side 02/05/2015  . Colitis 02/05/2015  . Unstable gait 04/22/2013    Jamey Reas PT, DPT 11/24/2016, 8:42 PM  [facility] [address] [place], [place], [address] Phone: [phone]   Fax:  [phone]  Name: Gina Fritz MRN: [id number] Date of Birth: 1941-03-19

## 2016-11-28 ENCOUNTER — Encounter: Payer: Self-pay | Admitting: Physical Therapy

## 2016-11-28 ENCOUNTER — Telehealth: Payer: Self-pay | Admitting: Physical Therapy

## 2016-11-28 ENCOUNTER — Ambulatory Visit: Payer: Medicare Other | Admitting: Physical Therapy

## 2016-11-28 DIAGNOSIS — M6281 Muscle weakness (generalized): Secondary | ICD-10-CM

## 2016-11-28 DIAGNOSIS — Z9181 History of falling: Secondary | ICD-10-CM

## 2016-11-28 DIAGNOSIS — R2681 Unsteadiness on feet: Secondary | ICD-10-CM

## 2016-11-28 DIAGNOSIS — R2689 Other abnormalities of gait and mobility: Secondary | ICD-10-CM

## 2016-11-28 NOTE — Therapy (Signed)
[facility] [address] [place], [place], [address] Phone: [phone]   Fax:  [phone]  Physical Therapy Treatment  Patient Details  Name: Gina Fritz MRN: [id number] Date of Birth: [birth year] Referring Provider: Betty G Martinique, MD  Encounter Date: 11/28/2016      PT End of Session - 11/28/16 2307    Visit Number 4   Number of Visits 17   Date for PT Re-Evaluation 01/06/17   Authorization Type Medicare G-code & progress note every 10th visit   PT Start Time 1445   PT Stop Time 1530   PT Time Calculation (min) 45 min   Equipment Utilized During Treatment Gait belt   Activity Tolerance Patient tolerated treatment well   Behavior During Therapy [facility] for tasks assessed/performed      Past Medical History:  Diagnosis Date  . Allergic rhinitis   . Anxiety   . CAP (community acquired pneumonia)    dx 02-05-2015  . Colitis    dx 02-05-2015  . COPD with emphysema ([place])   . Depression   . Dizziness   . GERD (gastroesophageal reflux disease)   . History of chronic sinusitis   . History of kidney stones   . Hyperlipidemia   . Hypertension   . Irritable bowel syndrome   . Loss of weight   . Pulmonary nodules    benign and stable per CT  . Right ureteral stone   . Sensorineural hearing loss, unilateral   . Subjective tinnitus     Past Surgical History:  Procedure Laterality Date  . CATARACT EXTRACTION Left   . CYSTO/  LEFT URETEROSCOPIC STONE EXTRACTION/ STENT PLACEMENT  08-10-2009  . ECTOPIC PREGNANCY SURGERY     and Appendectomy  . NASAL SINUS SURGERY    . REMOVAL CYST RIGHT ARM  age 22  . TUBAL LIGATION      There were no vitals filed for this visit.      Subjective Assessment - 11/28/16 1448    Subjective No falls. She went to [facility] and took her time getting around. She reports she noticed a few rollators at pulmonary rehab and talked with staff about using for walking track. She  reports staff aggreed with plan to trial Tuesday, 8/28.Marland Kitchen   Patient is accompained by: Family member   Pertinent History History of falls, syncope/dizziness, COPD w emphysema, anxiety/depression, HTN, IBS/colitis, hx of low back pain/artiritis, osteoporosis   Limitations Walking;Standing;House hold activities   Patient Stated Goals To be able to walk in home & community without fear of falling.    Currently in Pain? No/denies       See patient education PT instructed in use of rollator walker at pulmonary rehab. Pt verbalized understanding.  Squats switched to perform over chair for safety while holding chair back or counter. Static stance with eyes closed narrowing stance to point challenges (mild sway) from previous extra wide stance. PT switched single leg stance to placing forefoot in lower cabinet then hovering hands with goal 10 seconds.  Added side stepping right & left and marching foreward & backward with counter & cane.  Feet Apart, Head Motion - Eyes Open    With eyes open, feet apart, move head slowly: right/left, up/down, diagonal up-right/down-left & up-left/down-right. Repeat __10__ times per direction. Do __1__ sessions per day.  Copyright  VHI. All rights reserved.  Side Step    Use counter top putting as little pressure thru hands as possible.  Walk to right  then back to left side stepping.  Move one leg out to side with knee slightly bent, then bring other leg to it. You should take 10 steps to right & 10 steps to left. You may have to do 2-3 trips to get 10 steps /leg.   Copyright  VHI. All rights reserved.  "I love a Pharmacist, community & cane. March forward. Then march backwards like you are in the band.  You want to make 10 steps /leg. You may have to do 2-3 reps to get 10 steps /leg.  http://gt2.exer.us/345   Copyright  VHI. All rights reserved.     Used color tiles as visual target, PT demo & instructed in pelvic motion, weight shift &  coordination of movements to change directions 90* right & left. Progressed to stepping to right then walking along counter. Repeat stepping to left.                           PT Education - 11/28/16 1400    Education provided Yes   Education Details reviewed HEP with update to static stance EC, squat and modified single limb stance. PT added head movements EO feet apart, side steps & marching fore/back. Trial rollator at pulmonary rehab noting distance, amount of muscle fatigue at end of ambulation and vital sign differences. If more than one style, trial various styles on different days at Pulmonary Rehab.    Person(s) Educated Patient   Methods Explanation;Demonstration;Tactile cues;Verbal cues;Handout   Comprehension Verbalized understanding;Returned demonstration;Verbal cues required;Tactile cues required;Need further instruction          PT Short Term Goals - 11/23/16 2028      PT SHORT TERM GOAL #1   Title Pt will ambulate 150' with supervision and LRAD to improve ambulation and exhibit increased endurance   Time 4   Period Weeks   Status On-going   Target Date 12/09/16     PT SHORT TERM GOAL #2   Title Patient will perform sit to/from stand transfers from chairs with armrests with external support to stabilize with supervision.    Time 4   Period Weeks   Status On-going   Target Date 12/09/16     PT SHORT TERM GOAL #3   Title Pt will stand statically for 30 seconds without UE support with supervision to improve functional independence   Time 4   Period Weeks   Status On-going   Target Date 12/09/16     PT SHORT TERM GOAL #4   Title Pt will reach forward at least 5" & to floor with  UE assist with supervision to improve balance during functional activites   Time 4   Period Weeks   Status On-going   Target Date 12/09/16     PT SHORT TERM GOAL #5   Title Patient & husband verbalize & demonstrate understanding of initial HEP.    Time 4   Period  Weeks   Status On-going   Target Date 12/09/16           PT Long Term Goals - 11/23/16 2029      PT LONG TERM GOAL #1   Title Pt will improve ABC score to 15.6%   Baseline 5.6% on 11/10/16   Time 8   Period Weeks   Status On-going   Target Date 01/06/17     PT LONG TERM GOAL #2   Title Pt will ambulate 300' with  supervision from husband & LRAD to improve endurance and improve community ambulation   Time 8   Period Weeks   Status On-going   Target Date 01/06/17     PT LONG TERM GOAL #3   Title Pt will improve Berg score to >/= 20/56 to reduce risk of falls   Baseline 9/56 on 11/10/16   Time 8   Period Weeks   Status On-going   Target Date 01/06/17     PT LONG TERM GOAL #4   Title Pt will negotiate ramps & curbs with LRAD with supervision from husband to improve community ambulation   Time 8   Period Weeks   Status On-going   Target Date 01/06/17     PT LONG TERM GOAL #5   Title Patient ambulates 66' around furniture with LRAD modified independent to enable safe household mobility.    Time 8   Period Weeks   Status On-going   Target Date 01/06/17     PT LONG TERM GOAL #6   Title Patient & husband verbalize & demonstrate understanding of ongoing HEP / fitness plan.    Time 8   Period Weeks   Status On-going   Target Date 01/06/17               Plan - 11/28/16 2308    Clinical Impression Statement Patient appears to have better understanding of HEP including updated exercises.  She appears to understand trial of rollator walker at Pulmonary Rehab as appears would increase mobility with lower fall risk.    Rehab Potential Good   PT Frequency 2x / week   PT Duration 8 weeks   PT Treatment/Interventions ADLs/Self Care Home Management;Neuromuscular re-education;Balance training;Therapeutic exercise;Therapeutic activities;Functional mobility training;Stair training;Gait training;DME Instruction;Patient/family education;Energy conservation   PT Next Visit Plan  continue to work on LE strengthening and balance;  gait with rollator including ramps & curbs, outside if weather permits.    Consulted and Agree with Plan of Care Patient      Patient will benefit from skilled therapeutic intervention in order to improve the following deficits and impairments:  Abnormal gait, Cardiopulmonary status limiting activity, Decreased activity tolerance, Decreased balance, Decreased mobility, Decreased endurance, Decreased strength, Difficulty walking, Dizziness, Postural dysfunction  Visit Diagnosis: History of falling  Other abnormalities of gait and mobility  Unsteadiness on feet  Muscle weakness (generalized)     Problem List Patient Active Problem List   Diagnosis Date Noted  . GERD (gastroesophageal reflux disease) 10/19/2016  . Rectal sphincter spasm 05/26/2016  . Overactive bladder-urinary frequency 05/26/2016  . Bilateral low back pain 04/27/2016  . Generalized anxiety disorder 04/27/2016  . Hyperlipidemia 02/24/2016  . Osteoporosis 02/24/2016  . Essential hypertension, benign 12/18/2015  . Hypertensive urgency 11/14/2015  . COPD with emphysema ([place]) 05/22/2015  . COPD with asthma ([place]) 05/22/2015  . Malnutrition of moderate degree 02/09/2015  . Abdominal pain 02/05/2015  . Kidney stone on right side 02/05/2015  . Colitis 02/05/2015  . Unstable gait 04/22/2013    Kraven Calk PT, DPT 11/28/2016, 11:13 PM  [facility] [address] [place], [place], [address] Phone: [phone]   Fax:  [phone]  Name: Gina Fritz MRN: [id number] Date of Birth: 09/26/1940

## 2016-11-28 NOTE — Patient Instructions (Addendum)
Feet Apart, Head Motion - Eyes Open    With eyes open, feet apart, move head slowly: right/left, up/down, diagonal up-right/down-left & up-left/down-right. Repeat __10__ times per direction. Do __1__ sessions per day.  Copyright  VHI. All rights reserved.  Side Step    Use counter top putting as little pressure thru hands as possible.  Walk to right then back to left side stepping.  Move one leg out to side with knee slightly bent, then bring other leg to it. You should take 10 steps to right & 10 steps to left. You may have to do 2-3 trips to get 10 steps /leg.   Copyright  VHI. All rights reserved.  "I love a Pharmacist, community & cane. March forward. Then march backwards like you are in the band.  You want to make 10 steps /leg. You may have to do 2-3 reps to get 10 steps /leg.  http://gt2.exer.us/345   Copyright  VHI. All rights reserved.

## 2016-11-28 NOTE — Telephone Encounter (Signed)
Gina Fritz has improved gait & stability with use of a rollator walker with seat for community based gait. Can you please write a prescription for a rollator walker with seat and wheels >/= 6" please? Please place in prescription in EPIC or FAX to [phone] attn [place] you Jamey Reas, PT, DPT PT Specializing in [place] @8 /27/2018@ 11:45 PM Phone:  [phone]  Fax:  [phone] [facility] [address] [place], [place] [address]

## 2016-11-29 ENCOUNTER — Encounter (HOSPITAL_COMMUNITY)
Admission: RE | Admit: 2016-11-29 | Discharge: 2016-11-29 | Disposition: A | Discharge status: Home / Self Care | Payer: Medicare Other | Source: Ambulatory Visit | Attending: Pulmonary Disease | Admitting: Pulmonary Disease

## 2016-11-29 NOTE — Telephone Encounter (Signed)
Rx faxed over to [facility].

## 2016-11-29 NOTE — Telephone Encounter (Signed)
Rx for a rollator walker with seat can be faxed as requested. Thanks, BJ

## 2016-11-30 ENCOUNTER — Encounter: Payer: Self-pay | Admitting: Physical Therapy

## 2016-11-30 ENCOUNTER — Ambulatory Visit: Payer: Medicare Other | Admitting: Physical Therapy

## 2016-11-30 DIAGNOSIS — G8929 Other chronic pain: Secondary | ICD-10-CM

## 2016-11-30 DIAGNOSIS — M545 Low back pain: Secondary | ICD-10-CM

## 2016-11-30 DIAGNOSIS — M6281 Muscle weakness (generalized): Secondary | ICD-10-CM

## 2016-11-30 DIAGNOSIS — Z9181 History of falling: Secondary | ICD-10-CM

## 2016-11-30 DIAGNOSIS — R42 Dizziness and giddiness: Secondary | ICD-10-CM

## 2016-11-30 DIAGNOSIS — R2681 Unsteadiness on feet: Secondary | ICD-10-CM

## 2016-11-30 DIAGNOSIS — R2689 Other abnormalities of gait and mobility: Secondary | ICD-10-CM

## 2016-11-30 NOTE — Therapy (Signed)
[facility] [address] [place], [place], [address] Phone: [phone]   Fax:  [phone]  Physical Therapy Treatment  Patient Details  Name: Gina Fritz MRN: [id number] Date of Birth: December 27, 1940 Referring Provider: Betty G Martinique, MD  Encounter Date: 11/30/2016      PT End of Session - 11/30/16 2104    Visit Number 5   Number of Visits 17   Date for PT Re-Evaluation 01/06/17   Authorization Type Medicare G-code & progress note every 10th visit   PT Start Time 1445   PT Stop Time 1530   PT Time Calculation (min) 45 min   Equipment Utilized During Treatment Gait belt   Activity Tolerance Patient tolerated treatment well   Behavior During Therapy [facility] for tasks assessed/performed      Past Medical History:  Diagnosis Date  . Allergic rhinitis   . Anxiety   . CAP (community acquired pneumonia)    dx 02-05-2015  . Colitis    dx 02-05-2015  . COPD with emphysema ([place])   . Depression   . Dizziness   . GERD (gastroesophageal reflux disease)   . History of chronic sinusitis   . History of kidney stones   . Hyperlipidemia   . Hypertension   . Irritable bowel syndrome   . Loss of weight   . Pulmonary nodules    benign and stable per CT  . Right ureteral stone   . Sensorineural hearing loss, unilateral   . Subjective tinnitus     Past Surgical History:  Procedure Laterality Date  . CATARACT EXTRACTION Left   . CYSTO/  LEFT URETEROSCOPIC STONE EXTRACTION/ STENT PLACEMENT  08-10-2009  . ECTOPIC PREGNANCY SURGERY     and Appendectomy  . NASAL SINUS SURGERY    . REMOVAL CYST RIGHT ARM  age 89  . TUBAL LIGATION      There were no vitals filed for this visit.      Subjective Assessment - 11/30/16 1450    Subjective No falls. She had muscle soreness in bil. quads from exercises Mon in PT. She used rollator walker in pulmonary rehab and went 9 laps which is near her top distances. She was able to  maintain a steady pace.     Pertinent History History of falls, syncope/dizziness, COPD w emphysema, anxiety/depression, HTN, IBS/colitis, hx of low back pain/artiritis, osteoporosis   Limitations Walking;Standing;House hold activities   Patient Stated Goals To be able to walk in home & community without fear of falling.    Currently in Pain? No/denies       Do these exercises lying on back.  Axial Extension (Chin Tuck)    Press the back of your head into the pillow. Make sure your chin is tucked down, not tilting up.  Pull chin in and lengthen back of neck.  Hold for 2 pursed lip breathes. Repeat __10__ times. Do _1-2___ sessions per day.  http://gt2.exer.us/450   Copyright  VHI. All rights reserved.  Scapular Retraction (Standing)    Lie wiith arms at sides, pinch shoulder blades together. Hold for 2 pursed lip breathes. Repeat __10__ times. Do _1-2___ sessions per day.  http://orth.exer.us/945   Copyright  VHI. All rights reserved.  Depression (Active)    Reach right hand, sliding on the bed, towards the foot of the bed.  Repeat with left hand. You can progress to both hands at same time.  Hold for 2 pursed lip breathes. Repeat __10__ times. Do _1-2___ sessions  per day.  Copyright  VHI. All rights reserved.   Pelvic Tilt: Posterior - Legs Bent (Supine)    Tighten stomach and flatten back by rolling pelvis down.  Hold for 2 pursed lip breathes. Repeat __10__ times. Do _1-2___ sessions per day.  http://orth.exer.us/203   Copyright  VHI. All rights reserved.    Shoulder Blade Squeeze: Scapular Depression    Stand in good body alignment. Interlace fingers behind sacrum. Squeeze backbone with shoulder blades. Hold position, pull shoulders downward. Keep body still - move only shoulder blades and shoulders. Hold ___ seconds. Relax shoulders. Repeat ___ times.  Copyright  VHI. All rights reserved.                             PT  Education - 11/30/16 1530    Education provided Yes   Education Details updated HEP to include posture exercises. Breaking up exercises by location permits increased tolerance and easier to build into daily routine for ongoing compliance   Person(s) Educated Patient   Methods Explanation;Demonstration;Tactile cues;Verbal cues;Handout   Comprehension Verbalized understanding;Returned demonstration;Verbal cues required;Tactile cues required;Need further instruction          PT Short Term Goals - 11/23/16 2028      PT SHORT TERM GOAL #1   Title Pt will ambulate 150' with supervision and LRAD to improve ambulation and exhibit increased endurance   Time 4   Period Weeks   Status On-going   Target Date 12/09/16     PT SHORT TERM GOAL #2   Title Patient will perform sit to/from stand transfers from chairs with armrests with external support to stabilize with supervision.    Time 4   Period Weeks   Status On-going   Target Date 12/09/16     PT SHORT TERM GOAL #3   Title Pt will stand statically for 30 seconds without UE support with supervision to improve functional independence   Time 4   Period Weeks   Status On-going   Target Date 12/09/16     PT SHORT TERM GOAL #4   Title Pt will reach forward at least 5" & to floor with  UE assist with supervision to improve balance during functional activites   Time 4   Period Weeks   Status On-going   Target Date 12/09/16     PT SHORT TERM GOAL #5   Title Patient & husband verbalize & demonstrate understanding of initial HEP.    Time 4   Period Weeks   Status On-going   Target Date 12/09/16           PT Long Term Goals - 11/23/16 2029      PT LONG TERM GOAL #1   Title Pt will improve ABC score to 15.6%   Baseline 5.6% on 11/10/16   Time 8   Period Weeks   Status On-going   Target Date 01/06/17     PT LONG TERM GOAL #2   Title Pt will ambulate 300' with supervision from husband & LRAD to improve endurance and improve  community ambulation   Time 8   Period Weeks   Status On-going   Target Date 01/06/17     PT LONG TERM GOAL #3   Title Pt will improve Berg score to >/= 20/56 to reduce risk of falls   Baseline 9/56 on 11/10/16   Time 8   Period Weeks   Status On-going   Target Date  01/06/17     PT LONG TERM GOAL #4   Title Pt will negotiate ramps & curbs with LRAD with supervision from husband to improve community ambulation   Time 8   Period Weeks   Status On-going   Target Date 01/06/17     PT LONG TERM GOAL #5   Title Patient ambulates 26' around furniture with LRAD modified independent to enable safe household mobility.    Time 8   Period Weeks   Status On-going   Target Date 01/06/17     PT LONG TERM GOAL #6   Title Patient & husband verbalize & demonstrate understanding of ongoing HEP / fitness plan.    Time 8   Period Weeks   Status On-going   Target Date 01/06/17               Plan - 11/30/16 2104    Clinical Impression Statement Patient appears to understand updated HEP to address posture. Patient reports some muscle soreness from new exercises but they appear to be helping.    Rehab Potential Good   PT Frequency 2x / week   PT Duration 8 weeks   PT Treatment/Interventions ADLs/Self Care Home Management;Neuromuscular re-education;Balance training;Therapeutic exercise;Therapeutic activities;Functional mobility training;Stair training;Gait training;DME Instruction;Patient/family education;Energy conservation   PT Next Visit Plan continue to work on LE strengthening and balance;  gait with rollator including ramps & curbs, outside if weather permits.    Consulted and Agree with Plan of Care Patient      Patient will benefit from skilled therapeutic intervention in order to improve the following deficits and impairments:  Abnormal gait, Cardiopulmonary status limiting activity, Decreased activity tolerance, Decreased balance, Decreased mobility, Decreased endurance,  Decreased strength, Difficulty walking, Dizziness, Postural dysfunction  Visit Diagnosis: History of falling  Other abnormalities of gait and mobility  Unsteadiness on feet  Muscle weakness (generalized)  Dizziness and giddiness  Chronic midline low back pain without sciatica     Problem List Patient Active Problem List   Diagnosis Date Noted  . GERD (gastroesophageal reflux disease) 10/19/2016  . Rectal sphincter spasm 05/26/2016  . Overactive bladder-urinary frequency 05/26/2016  . Bilateral low back pain 04/27/2016  . Generalized anxiety disorder 04/27/2016  . Hyperlipidemia 02/24/2016  . Osteoporosis 02/24/2016  . Essential hypertension, benign 12/18/2015  . Hypertensive urgency 11/14/2015  . COPD with emphysema ([place]) 05/22/2015  . COPD with asthma ([place]) 05/22/2015  . Malnutrition of moderate degree 02/09/2015  . Abdominal pain 02/05/2015  . Kidney stone on right side 02/05/2015  . Colitis 02/05/2015  . Unstable gait 04/22/2013    Aviannah Castoro PT, DPT 11/30/2016, 9:09 PM  [facility] [address] [place], [place], [address] Phone: [phone]   Fax:  [phone]  Name: Gina Fritz MRN: [id number] Date of Birth: 05-09-1940

## 2016-11-30 NOTE — Patient Instructions (Addendum)
  Do these exercises lying on back.  Axial Extension (Chin Tuck)    Press the back of your head into the pillow. Make sure your chin is tucked down, not tilting up.  Pull chin in and lengthen back of neck.  Hold for 2 pursed lip breathes. Repeat __10__ times. Do _1-2___ sessions per day.  http://gt2.exer.us/450   Copyright  VHI. All rights reserved.  Scapular Retraction (Standing)    Lie wiith arms at sides, pinch shoulder blades together. Hold for 2 pursed lip breathes. Repeat __10__ times. Do _1-2___ sessions per day.  http://orth.exer.us/945   Copyright  VHI. All rights reserved.  Depression (Active)    Reach right hand, sliding on the bed, towards the foot of the bed.  Repeat with left hand. You can progress to both hands at same time.  Hold for 2 pursed lip breathes. Repeat __10__ times. Do _1-2___ sessions per day.  Copyright  VHI. All rights reserved.   Pelvic Tilt: Posterior - Legs Bent (Supine)    Tighten stomach and flatten back by rolling pelvis down.  Hold for 2 pursed lip breathes. Repeat __10__ times. Do _1-2___ sessions per day.  http://orth.exer.us/203   Copyright  VHI. All rights reserved.    Shoulder Blade Squeeze: Scapular Depression    Stand in good body alignment. Interlace fingers behind sacrum. Squeeze backbone with shoulder blades. Hold position, pull shoulders downward. Keep body still - move only shoulder blades and shoulders. Hold ___ seconds. Relax shoulders. Repeat ___ times.  Copyright  VHI. All rights reserved.

## 2016-12-01 ENCOUNTER — Encounter (HOSPITAL_COMMUNITY)
Admission: RE | Admit: 2016-12-01 | Discharge: 2016-12-01 | Disposition: A | Discharge status: Home / Self Care | Payer: Medicare Other | Source: Ambulatory Visit | Attending: Pulmonary Disease | Admitting: Pulmonary Disease

## 2016-12-06 ENCOUNTER — Encounter (HOSPITAL_COMMUNITY)
Admission: RE | Admit: 2016-12-06 | Discharge: 2016-12-06 | Disposition: A | Discharge status: Home / Self Care | Payer: Medicare Other | Source: Ambulatory Visit | Attending: Pulmonary Disease | Admitting: Pulmonary Disease

## 2016-12-06 DIAGNOSIS — J449 Chronic obstructive pulmonary disease, unspecified: Secondary | ICD-10-CM | POA: Insufficient documentation

## 2016-12-07 ENCOUNTER — Encounter: Payer: Self-pay | Admitting: Physical Therapy

## 2016-12-07 ENCOUNTER — Ambulatory Visit: Payer: Medicare Other | Attending: Family Medicine | Admitting: Physical Therapy

## 2016-12-07 DIAGNOSIS — G8929 Other chronic pain: Secondary | ICD-10-CM | POA: Insufficient documentation

## 2016-12-07 DIAGNOSIS — M545 Low back pain: Secondary | ICD-10-CM | POA: Diagnosis present

## 2016-12-07 DIAGNOSIS — R42 Dizziness and giddiness: Secondary | ICD-10-CM | POA: Insufficient documentation

## 2016-12-07 DIAGNOSIS — M6281 Muscle weakness (generalized): Secondary | ICD-10-CM | POA: Insufficient documentation

## 2016-12-07 DIAGNOSIS — Z9181 History of falling: Secondary | ICD-10-CM | POA: Insufficient documentation

## 2016-12-07 DIAGNOSIS — R2681 Unsteadiness on feet: Secondary | ICD-10-CM | POA: Diagnosis present

## 2016-12-07 DIAGNOSIS — R2689 Other abnormalities of gait and mobility: Secondary | ICD-10-CM | POA: Diagnosis present

## 2016-12-07 NOTE — Therapy (Signed)
[facility] [address] [place], [place], [address] Phone: [phone]   Fax:  [phone]  Physical Therapy Treatment  Patient Details  Name: Gina Fritz MRN: [id number] Date of Birth: 06-06-1940 Referring Provider: Betty G Martinique, MD  Encounter Date: 12/07/2016      PT End of Session - 12/07/16 2049    Visit Number 6   Number of Visits 17   Date for PT Re-Evaluation 01/06/17   Authorization Type Medicare G-code & progress note every 10th visit   PT Start Time 1453   PT Stop Time 1535   PT Time Calculation (min) 42 min   Equipment Utilized During Treatment Gait belt   Activity Tolerance Patient tolerated treatment well;Patient limited by fatigue   Behavior During Therapy [facility] for tasks assessed/performed      Past Medical History:  Diagnosis Date  . Allergic rhinitis   . Anxiety   . CAP (community acquired pneumonia)    dx 02-05-2015  . Colitis    dx 02-05-2015  . COPD with emphysema ([place])   . Depression   . Dizziness   . GERD (gastroesophageal reflux disease)   . History of chronic sinusitis   . History of kidney stones   . Hyperlipidemia   . Hypertension   . Irritable bowel syndrome   . Loss of weight   . Pulmonary nodules    benign and stable per CT  . Right ureteral stone   . Sensorineural hearing loss, unilateral   . Subjective tinnitus     Past Surgical History:  Procedure Laterality Date  . CATARACT EXTRACTION Left   . CYSTO/  LEFT URETEROSCOPIC STONE EXTRACTION/ STENT PLACEMENT  08-10-2009  . ECTOPIC PREGNANCY SURGERY     and Appendectomy  . NASAL SINUS SURGERY    . REMOVAL CYST RIGHT ARM  age 43  . TUBAL LIGATION      There were no vitals filed for this visit.      Subjective Assessment - 12/07/16 1453    Subjective She used rollator at pulmonary rehab but could not remember how to properly use. She went to pool for exercise today for first time in >month.    Pertinent  History History of falls, syncope/dizziness, COPD w emphysema, anxiety/depression, HTN, IBS/colitis, hx of low back pain/artiritis, osteoporosis   Limitations Walking;Standing;House hold activities   Patient Stated Goals To be able to walk in home & community without fear of falling.    Currently in Pain? No/denies      Therapeutic Exercise: PT reviewed HEP & instructed in balancing pulmonary rehab, pool exercises & HEP. Pt verbalized better understanding.  Therapeutic Activities: PT demo, instructed in use of [place]. Pt return demo kneeling on lower bar and sitting on high bar with verbal cues & supervision.  Patient stands statically 30sec with supervision. Pt reaches 5" anteriorly & to floor with UE support with supervision.   Gait Training:  Pt sit to/from stand with armrests with supervision. PT reviewed safe use of rollator walker including proper height of armrests. Pt ambulated 400' with rollator walker indoors with supervision, verbal cues. Seated rest. Pt ambulated 400' outdoors on paved surfaces with rollator walker with 2 standing rests for ~30 seconds with supervision & verbal cues.                           PT Education - 12/07/16 1500    Education provided Yes  Education Details use of [employer] psychotherapist. Use of rollator walker. Balancing exercise programs: pulmonary, pool & PT exercises with recommendation of only one/day.    Person(s) Educated Patient   Methods Explanation;Demonstration;Verbal cues   Comprehension Verbalized understanding;Need further instruction;Verbal cues required          PT Short Term Goals - 12/07/16 2050      PT SHORT TERM GOAL #1   Title Pt will ambulate 150' with supervision and LRAD to improve ambulation and exhibit increased endurance   Baseline MET 12/07/16 with rollator walker   Time 4   Period Weeks   Status Achieved     PT SHORT TERM GOAL #2   Title Patient will perform sit to/from stand  transfers from chairs with armrests with external support to stabilize with supervision.    Baseline MET 12/07/16   Time 4   Period Weeks   Status Achieved     PT SHORT TERM GOAL #3   Title Pt will stand statically for 30 seconds without UE support with supervision to improve functional independence   Baseline MET 12/07/16   Time 4   Period Weeks   Status Achieved     PT SHORT TERM GOAL #4   Title Pt will reach forward at least 5" & to floor with  UE assist with supervision to improve balance during functional activites   Baseline MET 12/07/16   Time 4   Period Weeks   Status Achieved     PT SHORT TERM GOAL #5   Title Patient & husband verbalize & demonstrate understanding of initial HEP.    Baseline MET 12/07/16   Time 4   Period Weeks   Status Achieved           PT Long Term Goals - 11/23/16 2029      PT LONG TERM GOAL #1   Title Pt will improve ABC score to 15.6%   Baseline 5.6% on 11/10/16   Time 8   Period Weeks   Status On-going   Target Date 01/06/17     PT LONG TERM GOAL #2   Title Pt will ambulate 300' with supervision from husband & LRAD to improve endurance and improve community ambulation   Time 8   Period Weeks   Status On-going   Target Date 01/06/17     PT LONG TERM GOAL #3   Title Pt will improve Berg score to >/= 20/56 to reduce risk of falls   Baseline 9/56 on 11/10/16   Time 8   Period Weeks   Status On-going   Target Date 01/06/17     PT LONG TERM GOAL #4   Title Pt will negotiate ramps & curbs with LRAD with supervision from husband to improve community ambulation   Time 8   Period Weeks   Status On-going   Target Date 01/06/17     PT LONG TERM GOAL #5   Title Patient ambulates 12' around furniture with LRAD modified independent to enable safe household mobility.    Time 8   Period Weeks   Status On-going   Target Date 01/06/17     PT LONG TERM GOAL #6   Title Patient & husband verbalize & demonstrate understanding of ongoing HEP /  fitness plan.    Time 8   Period Weeks   Status On-going   Target Date 01/06/17               Plan - 12/07/16 2051  Clinical Impression Statement Patient appears more fatigued today with increased dyspnea which could be related to exercising at pool earlier today. Pt met all STGs indicating progress.     Rehab Potential Good   PT Frequency 2x / week   PT Duration 8 weeks   PT Treatment/Interventions ADLs/Self Care Home Management;Neuromuscular re-education;Balance training;Therapeutic exercise;Therapeutic activities;Functional mobility training;Stair training;Gait training;DME Instruction;Patient/family education;Energy conservation   PT Next Visit Plan continue to work on LE strengthening and balance;  gait with rollator including ramps & curbs, outside if weather permits.    Consulted and Agree with Plan of Care Patient      Patient will benefit from skilled therapeutic intervention in order to improve the following deficits and impairments:  Abnormal gait, Cardiopulmonary status limiting activity, Decreased activity tolerance, Decreased balance, Decreased mobility, Decreased endurance, Decreased strength, Difficulty walking, Dizziness, Postural dysfunction  Visit Diagnosis: Other abnormalities of gait and mobility  Unsteadiness on feet  Muscle weakness (generalized)  Dizziness and giddiness     Problem List Patient Active Problem List   Diagnosis Date Noted  . GERD (gastroesophageal reflux disease) 10/19/2016  . Rectal sphincter spasm 05/26/2016  . Overactive bladder-urinary frequency 05/26/2016  . Bilateral low back pain 04/27/2016  . Generalized anxiety disorder 04/27/2016  . Hyperlipidemia 02/24/2016  . Osteoporosis 02/24/2016  . Essential hypertension, benign 12/18/2015  . Hypertensive urgency 11/14/2015  . COPD with emphysema ([place]) 05/22/2015  . COPD with asthma ([place]) 05/22/2015  . Malnutrition of moderate degree 02/09/2015  . Abdominal pain 02/05/2015   . Kidney stone on right side 02/05/2015  . Colitis 02/05/2015  . Unstable gait 04/22/2013    Aadan Chenier PT, DPT 12/07/2016, 8:56 PM  [facility] [address] [place], [place], [address] Phone: [phone]   Fax:  [phone]  Name: Gina Fritz MRN: [id number] Date of Birth: [birth year]

## 2016-12-08 ENCOUNTER — Encounter (HOSPITAL_COMMUNITY)
Admission: RE | Admit: 2016-12-08 | Discharge: 2016-12-08 | Disposition: A | Discharge status: Home / Self Care | Payer: Medicare Other | Source: Ambulatory Visit | Attending: Pulmonary Disease | Admitting: Pulmonary Disease

## 2016-12-12 ENCOUNTER — Ambulatory Visit: Payer: Medicare Other | Admitting: Physical Therapy

## 2016-12-12 ENCOUNTER — Encounter: Payer: Self-pay | Admitting: Physical Therapy

## 2016-12-12 DIAGNOSIS — M6281 Muscle weakness (generalized): Secondary | ICD-10-CM

## 2016-12-12 DIAGNOSIS — R42 Dizziness and giddiness: Secondary | ICD-10-CM

## 2016-12-12 DIAGNOSIS — R2689 Other abnormalities of gait and mobility: Secondary | ICD-10-CM | POA: Diagnosis not present

## 2016-12-12 DIAGNOSIS — R2681 Unsteadiness on feet: Secondary | ICD-10-CM

## 2016-12-12 NOTE — Therapy (Signed)
[facility] [address] [place], [place], [address] Phone: [phone]   Fax:  [phone]  Physical Therapy Treatment  Patient Details  Name: Gina Fritz MRN: [id number] Date of Birth: 1941/02/15 Referring Provider: Betty G Martinique, MD  Encounter Date: 12/12/2016      PT End of Session - 12/12/16 2218    Visit Number 7   Number of Visits 17   Date for PT Re-Evaluation 01/06/17   Authorization Type Medicare G-code & progress note every 10th visit   PT Start Time 1445   PT Stop Time 1530   PT Time Calculation (min) 45 min   Equipment Utilized During Treatment Gait belt   Activity Tolerance Patient tolerated treatment well;Patient limited by fatigue   Behavior During Therapy [facility] for tasks assessed/performed      Past Medical History:  Diagnosis Date  . Allergic rhinitis   . Anxiety   . CAP (community acquired pneumonia)    dx 02-05-2015  . Colitis    dx 02-05-2015  . COPD with emphysema ([place])   . Depression   . Dizziness   . GERD (gastroesophageal reflux disease)   . History of chronic sinusitis   . History of kidney stones   . Hyperlipidemia   . Hypertension   . Irritable bowel syndrome   . Loss of weight   . Pulmonary nodules    benign and stable per CT  . Right ureteral stone   . Sensorineural hearing loss, unilateral   . Subjective tinnitus     Past Surgical History:  Procedure Laterality Date  . CATARACT EXTRACTION Left   . CYSTO/  LEFT URETEROSCOPIC STONE EXTRACTION/ STENT PLACEMENT  08-10-2009  . ECTOPIC PREGNANCY SURGERY     and Appendectomy  . NASAL SINUS SURGERY    . REMOVAL CYST RIGHT ARM  age 76  . TUBAL LIGATION      There were no vitals filed for this visit.      Subjective Assessment - 12/12/16 1449    Subjective No falls.  She tried rollator walker at pulmonary rehab again and it helped.      Pertinent History History of falls, syncope/dizziness, COPD w emphysema,  anxiety/depression, HTN, IBS/colitis, hx of low back pain/artiritis, osteoporosis   Limitations Walking;Standing;House hold activities   Patient Stated Goals To be able to walk in home & community without fear of falling.    Currently in Pain? No/denies     PT gave patient copy of rollator walker prescription with her plans to take to [facility].  Neuromuscular Re-education: In parallel bars for intermittent UE support & mirror for visual feedback. Tactile, verbal & manual cues for balance reactions. Rocker board: laterally & ant/post - stabilization, head turns and wt shifts with recovery Cross stance on foam beam: static stance, head turns rt/lt, up/down & diagonals, alt. Stepping forward and backward. Step strategy reaching beyond base with PT assist & PT resisting at pelvis with release. Pt improved step strategy recovery.                               PT Short Term Goals - 12/07/16 2050      PT SHORT TERM GOAL #1   Title Pt will ambulate 150' with supervision and LRAD to improve ambulation and exhibit increased endurance   Baseline MET 12/07/16 with rollator walker   Time 4   Period Weeks   Status  Achieved     PT SHORT TERM GOAL #2   Title Patient will perform sit to/from stand transfers from chairs with armrests with external support to stabilize with supervision.    Baseline MET 12/07/16   Time 4   Period Weeks   Status Achieved     PT SHORT TERM GOAL #3   Title Pt will stand statically for 30 seconds without UE support with supervision to improve functional independence   Baseline MET 12/07/16   Time 4   Period Weeks   Status Achieved     PT SHORT TERM GOAL #4   Title Pt will reach forward at least 5" & to floor with  UE assist with supervision to improve balance during functional activites   Baseline MET 12/07/16   Time 4   Period Weeks   Status Achieved     PT SHORT TERM GOAL #5   Title Patient & husband verbalize & demonstrate  understanding of initial HEP.    Baseline MET 12/07/16   Time 4   Period Weeks   Status Achieved           PT Long Term Goals - 11/23/16 2029      PT LONG TERM GOAL #1   Title Pt will improve ABC score to 15.6%   Baseline 5.6% on 11/10/16   Time 8   Period Weeks   Status On-going   Target Date 01/06/17     PT LONG TERM GOAL #2   Title Pt will ambulate 300' with supervision from husband & LRAD to improve endurance and improve community ambulation   Time 8   Period Weeks   Status On-going   Target Date 01/06/17     PT LONG TERM GOAL #3   Title Pt will improve Berg score to >/= 20/56 to reduce risk of falls   Baseline 9/56 on 11/10/16   Time 8   Period Weeks   Status On-going   Target Date 01/06/17     PT LONG TERM GOAL #4   Title Pt will negotiate ramps & curbs with LRAD with supervision from husband to improve community ambulation   Time 8   Period Weeks   Status On-going   Target Date 01/06/17     PT LONG TERM GOAL #5   Title Patient ambulates 54' around furniture with LRAD modified independent to enable safe household mobility.    Time 8   Period Weeks   Status On-going   Target Date 01/06/17     PT LONG TERM GOAL #6   Title Patient & husband verbalize & demonstrate understanding of ongoing HEP / fitness plan.    Time 8   Period Weeks   Status On-going   Target Date 01/06/17               Plan - 12/12/16 1530    Clinical Impression Statement Patient improved balance strategies - ankle, hip & step with skilled instructions including visual, tactile & verbal cues.    Rehab Potential Good   PT Frequency 2x / week   PT Duration 8 weeks   PT Treatment/Interventions ADLs/Self Care Home Management;Neuromuscular re-education;Balance training;Therapeutic exercise;Therapeutic activities;Functional mobility training;Stair training;Gait training;DME Instruction;Patient/family education;Energy conservation   PT Next Visit Plan continue to work on LE strengthening  and balance;  gait with rollator including ramps & curbs, outside if weather permits.    Consulted and Agree with Plan of Care Patient      Patient will benefit from skilled  therapeutic intervention in order to improve the following deficits and impairments:  Abnormal gait, Cardiopulmonary status limiting activity, Decreased activity tolerance, Decreased balance, Decreased mobility, Decreased endurance, Decreased strength, Difficulty walking, Dizziness, Postural dysfunction  Visit Diagnosis: Other abnormalities of gait and mobility  Unsteadiness on feet  Muscle weakness (generalized)  Dizziness and giddiness     Problem List Patient Active Problem List   Diagnosis Date Noted  . GERD (gastroesophageal reflux disease) 10/19/2016  . Rectal sphincter spasm 05/26/2016  . Overactive bladder-urinary frequency 05/26/2016  . Bilateral low back pain 04/27/2016  . Generalized anxiety disorder 04/27/2016  . Hyperlipidemia 02/24/2016  . Osteoporosis 02/24/2016  . Essential hypertension, benign 12/18/2015  . Hypertensive urgency 11/14/2015  . COPD with emphysema ([place]) 05/22/2015  . COPD with asthma ([place]) 05/22/2015  . Malnutrition of moderate degree 02/09/2015  . Abdominal pain 02/05/2015  . Kidney stone on right side 02/05/2015  . Colitis 02/05/2015  . Unstable gait 04/22/2013    Missi Mcmackin PT, DPT 12/12/2016, 10:22 PM  [facility] [address] [place], [place], [address] Phone: [phone]   Fax:  [phone]  Name: Grainne Knights MRN: [id number] Date of Birth: 18-Jan-1941

## 2016-12-13 ENCOUNTER — Encounter (HOSPITAL_COMMUNITY)
Admission: RE | Admit: 2016-12-13 | Discharge: 2016-12-13 | Disposition: A | Discharge status: Home / Self Care | Payer: Medicare Other | Source: Ambulatory Visit | Attending: Pulmonary Disease | Admitting: Pulmonary Disease

## 2016-12-14 ENCOUNTER — Ambulatory Visit: Payer: Medicare Other | Admitting: Physical Therapy

## 2016-12-14 ENCOUNTER — Encounter: Payer: Self-pay | Admitting: Physical Therapy

## 2016-12-14 ENCOUNTER — Encounter (INDEPENDENT_AMBULATORY_CARE_PROVIDER_SITE_OTHER): Payer: Medicare Other | Admitting: Ophthalmology

## 2016-12-14 DIAGNOSIS — M545 Low back pain, unspecified: Secondary | ICD-10-CM

## 2016-12-14 DIAGNOSIS — G8929 Other chronic pain: Secondary | ICD-10-CM

## 2016-12-14 DIAGNOSIS — R2689 Other abnormalities of gait and mobility: Secondary | ICD-10-CM | POA: Diagnosis not present

## 2016-12-14 DIAGNOSIS — Z9181 History of falling: Secondary | ICD-10-CM

## 2016-12-14 DIAGNOSIS — R2681 Unsteadiness on feet: Secondary | ICD-10-CM

## 2016-12-14 DIAGNOSIS — R42 Dizziness and giddiness: Secondary | ICD-10-CM

## 2016-12-14 DIAGNOSIS — H1011 Acute atopic conjunctivitis, right eye: Secondary | ICD-10-CM

## 2016-12-14 DIAGNOSIS — M6281 Muscle weakness (generalized): Secondary | ICD-10-CM

## 2016-12-15 ENCOUNTER — Encounter (HOSPITAL_COMMUNITY)
Admission: RE | Admit: 2016-12-15 | Discharge: 2016-12-15 | Disposition: A | Discharge status: Home / Self Care | Payer: Medicare Other | Source: Ambulatory Visit | Attending: Pulmonary Disease | Admitting: Pulmonary Disease

## 2016-12-15 NOTE — Therapy (Signed)
[facility] [address] [place], [place], [address] Phone: [phone]   Fax:  [phone]  Physical Therapy Treatment  Patient Details  Name: Gina Fritz MRN: [id number] Date of Birth: [birth year] Referring Provider: Betty G Martinique, MD  Encounter Date: 12/14/2016      PT End of Session - 12/14/16 1716    Visit Number 8   Number of Visits 17   Date for PT Re-Evaluation 01/06/17   Authorization Type Medicare G-code & progress note every 10th visit   PT Start Time 1445   PT Stop Time 1530   PT Time Calculation (min) 45 min   Equipment Utilized During Treatment Gait belt   Activity Tolerance Patient tolerated treatment well   Behavior During Therapy [facility] for tasks assessed/performed      Past Medical History:  Diagnosis Date  . Allergic rhinitis   . Anxiety   . CAP (community acquired pneumonia)    dx 02-05-2015  . Colitis    dx 02-05-2015  . COPD with emphysema ([place])   . Depression   . Dizziness   . GERD (gastroesophageal reflux disease)   . History of chronic sinusitis   . History of kidney stones   . Hyperlipidemia   . Hypertension   . Irritable bowel syndrome   . Loss of weight   . Pulmonary nodules    benign and stable per CT  . Right ureteral stone   . Sensorineural hearing loss, unilateral   . Subjective tinnitus     Past Surgical History:  Procedure Laterality Date  . CATARACT EXTRACTION Left   . CYSTO/  LEFT URETEROSCOPIC STONE EXTRACTION/ STENT PLACEMENT  08-10-2009  . ECTOPIC PREGNANCY SURGERY     and Appendectomy  . NASAL SINUS SURGERY    . REMOVAL CYST RIGHT ARM  age 40  . TUBAL LIGATION      There were no vitals filed for this visit.      Subjective Assessment - 12/14/16 1451    Subjective She used rollator for first & third station at pulmonary rehab with focus on PT instruction. She was able to go the whole 15 minutes for each set.  She plans to do chair Yoga Friday if  weather cooperates.    Pertinent History History of falls, syncope/dizziness, COPD w emphysema, anxiety/depression, HTN, IBS/colitis, hx of low back pain/artiritis, osteoporosis   Limitations Walking;Standing;House hold activities   Patient Stated Goals To be able to walk in home & community without fear of falling.    Currently in Pain? No/denies      Neuromuscular Re-education: In parallel bars for intermittent UE support & mirror for visual feedback. Tactile, verbal & manual cues for balance reactions. Rocker board: laterally & ant/post - stabilization, head turns and wt shifts with recovery Cross stance on foam beam: static stance, head turns rt/lt, up/down & diagonals, alt. Stepping forward and backward. Step strategy reaching beyond base with PT assist & PT resisting at pelvis with release. Pt improved step strategy recovery.                             PT Education - 12/14/16 1454    Education provided Yes   Education Details dress shoe design with need to have cobbler add resistance to heel of her flats and anti-slip at posterior to decrease slippage.      Person(s) Educated Patient   Methods Explanation;Demonstration;Verbal cues   Comprehension  Verbalized understanding          PT Short Term Goals - 12/07/16 2050      PT SHORT TERM GOAL #1   Title Pt will ambulate 150' with supervision and LRAD to improve ambulation and exhibit increased endurance   Baseline MET 12/07/16 with rollator walker   Time 4   Period Weeks   Status Achieved     PT SHORT TERM GOAL #2   Title Patient will perform sit to/from stand transfers from chairs with armrests with external support to stabilize with supervision.    Baseline MET 12/07/16   Time 4   Period Weeks   Status Achieved     PT SHORT TERM GOAL #3   Title Pt will stand statically for 30 seconds without UE support with supervision to improve functional independence   Baseline MET 12/07/16   Time 4   Period Weeks    Status Achieved     PT SHORT TERM GOAL #4   Title Pt will reach forward at least 5" & to floor with  UE assist with supervision to improve balance during functional activites   Baseline MET 12/07/16   Time 4   Period Weeks   Status Achieved     PT SHORT TERM GOAL #5   Title Patient & husband verbalize & demonstrate understanding of initial HEP.    Baseline MET 12/07/16   Time 4   Period Weeks   Status Achieved           PT Long Term Goals - 11/23/16 2029      PT LONG TERM GOAL #1   Title Pt will improve ABC score to 15.6%   Baseline 5.6% on 11/10/16   Time 8   Period Weeks   Status On-going   Target Date 01/06/17     PT LONG TERM GOAL #2   Title Pt will ambulate 300' with supervision from husband & LRAD to improve endurance and improve community ambulation   Time 8   Period Weeks   Status On-going   Target Date 01/06/17     PT LONG TERM GOAL #3   Title Pt will improve Berg score to >/= 20/56 to reduce risk of falls   Baseline 9/56 on 11/10/16   Time 8   Period Weeks   Status On-going   Target Date 01/06/17     PT LONG TERM GOAL #4   Title Pt will negotiate ramps & curbs with LRAD with supervision from husband to improve community ambulation   Time 8   Period Weeks   Status On-going   Target Date 01/06/17     PT LONG TERM GOAL #5   Title Patient ambulates 54' around furniture with LRAD modified independent to enable safe household mobility.    Time 8   Period Weeks   Status On-going   Target Date 01/06/17     PT LONG TERM GOAL #6   Title Patient & husband verbalize & demonstrate understanding of ongoing HEP / fitness plan.    Time 8   Period Weeks   Status On-going   Target Date 01/06/17               Plan - 12/14/16 1717    Clinical Impression Statement Patient improved step strategy in all directions with skilled instructions. Pt had less anxiety with more challenging balance activities today.    Rehab Potential Good   PT Frequency 2x / week    PT Duration 8  weeks   PT Treatment/Interventions ADLs/Self Care Home Management;Neuromuscular re-education;Balance training;Therapeutic exercise;Therapeutic activities;Functional mobility training;Stair training;Gait training;DME Instruction;Patient/family education;Energy conservation   PT Next Visit Plan continue to work on LE strengthening and balance;  gait with rollator including ramps & curbs, outside if weather permits.    Consulted and Agree with Plan of Care Patient      Patient will benefit from skilled therapeutic intervention in order to improve the following deficits and impairments:  Abnormal gait, Cardiopulmonary status limiting activity, Decreased activity tolerance, Decreased balance, Decreased mobility, Decreased endurance, Decreased strength, Difficulty walking, Dizziness, Postural dysfunction  Visit Diagnosis: Other abnormalities of gait and mobility  Unsteadiness on feet  Muscle weakness (generalized)  Dizziness and giddiness  History of falling  Chronic midline low back pain without sciatica     Problem List Patient Active Problem List   Diagnosis Date Noted  . GERD (gastroesophageal reflux disease) 10/19/2016  . Rectal sphincter spasm 05/26/2016  . Overactive bladder-urinary frequency 05/26/2016  . Bilateral low back pain 04/27/2016  . Generalized anxiety disorder 04/27/2016  . Hyperlipidemia 02/24/2016  . Osteoporosis 02/24/2016  . Essential hypertension, benign 12/18/2015  . Hypertensive urgency 11/14/2015  . COPD with emphysema ([place]) 05/22/2015  . COPD with asthma ([place]) 05/22/2015  . Malnutrition of moderate degree 02/09/2015  . Abdominal pain 02/05/2015  . Kidney stone on right side 02/05/2015  . Colitis 02/05/2015  . Unstable gait 04/22/2013    Lindsy Cerullo PT, DPT 12/15/2016, 11:20 AM  [facility] [address] [place], [place], [address] Phone: [phone]   Fax:   [phone]  Name: Corazon Nickolas MRN: [id number] Date of Birth: [birth year]

## 2016-12-19 ENCOUNTER — Ambulatory Visit: Payer: Medicare Other | Admitting: Physical Therapy

## 2016-12-19 ENCOUNTER — Encounter: Payer: Self-pay | Admitting: Physical Therapy

## 2016-12-19 DIAGNOSIS — R2689 Other abnormalities of gait and mobility: Secondary | ICD-10-CM | POA: Diagnosis not present

## 2016-12-19 DIAGNOSIS — M6281 Muscle weakness (generalized): Secondary | ICD-10-CM

## 2016-12-19 DIAGNOSIS — Z9181 History of falling: Secondary | ICD-10-CM

## 2016-12-19 DIAGNOSIS — R2681 Unsteadiness on feet: Secondary | ICD-10-CM

## 2016-12-20 ENCOUNTER — Encounter (HOSPITAL_COMMUNITY)
Admission: RE | Admit: 2016-12-20 | Discharge: 2016-12-20 | Disposition: A | Discharge status: Home / Self Care | Payer: Medicare Other | Source: Ambulatory Visit | Attending: Pulmonary Disease | Admitting: Pulmonary Disease

## 2016-12-21 ENCOUNTER — Encounter: Payer: Self-pay | Admitting: Physical Therapy

## 2016-12-21 ENCOUNTER — Ambulatory Visit: Payer: Medicare Other | Admitting: Physical Therapy

## 2016-12-21 DIAGNOSIS — R2689 Other abnormalities of gait and mobility: Secondary | ICD-10-CM

## 2016-12-21 DIAGNOSIS — M6281 Muscle weakness (generalized): Secondary | ICD-10-CM

## 2016-12-21 DIAGNOSIS — R2681 Unsteadiness on feet: Secondary | ICD-10-CM

## 2016-12-21 NOTE — Therapy (Signed)
[facility] [address] [place], [place], [address] Phone: [phone]   Fax:  [phone]  Physical Therapy Treatment  Patient Details  Name: Gina Fritz MRN: [id number] Date of Birth: [birth year] Referring Provider: Betty G Martinique, MD  Encounter Date: 12/19/2016   12/19/16 1451  PT Visits / Re-Eval  Visit Number 9  Number of Visits 17  Date for PT Re-Evaluation 01/06/17  Authorization  Authorization Type Medicare G-code & progress note every 10th visit  PT Time Calculation  PT Start Time 1450  PT Stop Time 1530  PT Time Calculation (min) 40 min  PT - End of Session  Equipment Utilized During Treatment Gait belt  Activity Tolerance Patient tolerated treatment well  Behavior During Therapy [facility] for tasks assessed/performed     Past Medical History:  Diagnosis Date  . Allergic rhinitis   . Anxiety   . CAP (community acquired pneumonia)    dx 02-05-2015  . Colitis    dx 02-05-2015  . COPD with emphysema ([place])   . Depression   . Dizziness   . GERD (gastroesophageal reflux disease)   . History of chronic sinusitis   . History of kidney stones   . Hyperlipidemia   . Hypertension   . Irritable bowel syndrome   . Loss of weight   . Pulmonary nodules    benign and stable per CT  . Right ureteral stone   . Sensorineural hearing loss, unilateral   . Subjective tinnitus     Past Surgical History:  Procedure Laterality Date  . CATARACT EXTRACTION Left   . CYSTO/  LEFT URETEROSCOPIC STONE EXTRACTION/ STENT PLACEMENT  08-10-2009  . ECTOPIC PREGNANCY SURGERY     and Appendectomy  . NASAL SINUS SURGERY    . REMOVAL CYST RIGHT ARM  age 87  . TUBAL LIGATION      There were no vitals filed for this visit.     12/19/16 1451  Symptoms/Limitations  Subjective No new complaints. No falls or pain to report.   Patient is accompained by: Family member  Pertinent History History of falls,  syncope/dizziness, COPD w emphysema, anxiety/depression, HTN, IBS/colitis, hx of low back pain/artiritis, osteoporosis  Limitations Walking;Standing;House hold activities  Patient Stated Goals To be able to walk in home & community without fear of falling.   Pain Assessment  Currently in Pain? No/denies      12/19/16 1452  High Level Balance  High Level Balance Activities Side stepping;Marching forwards;Marching backwards;Tandem walking (tandem fwd/bwd, toe walk fwd/bwd)  High Level Balance Comments on both red mats next to counter top: performed 3 laps each way with intermittent UE support. min guard to min assist for balance with cues on posture, ex form and weight shifitng for balance.     12/19/16 1511  Balance Exercises: Standing  Standing Eyes Closed Wide (BOA);Narrow base of support (BOS);Head turns;Foam/compliant surface;Other reps (comment);30 secs;Limitations  SLS with Vectors Foam/compliant surface;Other reps (comment);Limitations  Balance Exercises: Standing  SLS with Vectors Limitations 6 cones along edges of red mats: toe taps to each with side stepping left<>right x 1` lap each way with no UE support and min guard to min assist for balance.  Standing Eyes Closed Limitations on airex in corner: feet apart progressing to feet together for EC no head movements, to EC with head movements left<>right and up<>down. min guard to min assist for balance with cues on posture and weight shifting to assist with balance.  PT Short Term Goals - 12/07/16 2050      PT SHORT TERM GOAL #1   Title Pt will ambulate 150' with supervision and LRAD to improve ambulation and exhibit increased endurance   Baseline MET 12/07/16 with rollator walker   Time 4   Period Weeks   Status Achieved     PT SHORT TERM GOAL #2   Title Patient will perform sit to/from stand transfers from chairs with armrests with external support to stabilize with supervision.    Baseline MET 12/07/16   Time 4    Period Weeks   Status Achieved     PT SHORT TERM GOAL #3   Title Pt will stand statically for 30 seconds without UE support with supervision to improve functional independence   Baseline MET 12/07/16   Time 4   Period Weeks   Status Achieved     PT SHORT TERM GOAL #4   Title Pt will reach forward at least 5" & to floor with  UE assist with supervision to improve balance during functional activites   Baseline MET 12/07/16   Time 4   Period Weeks   Status Achieved     PT SHORT TERM GOAL #5   Title Patient & husband verbalize & demonstrate understanding of initial HEP.    Baseline MET 12/07/16   Time 4   Period Weeks   Status Achieved           PT Long Term Goals - 11/23/16 2029      PT LONG TERM GOAL #1   Title Pt will improve ABC score to 15.6%   Baseline 5.6% on 11/10/16   Time 8   Period Weeks   Status On-going   Target Date 01/06/17     PT LONG TERM GOAL #2   Title Pt will ambulate 300' with supervision from husband & LRAD to improve endurance and improve community ambulation   Time 8   Period Weeks   Status On-going   Target Date 01/06/17     PT LONG TERM GOAL #3   Title Pt will improve Berg score to >/= 20/56 to reduce risk of falls   Baseline 9/56 on 11/10/16   Time 8   Period Weeks   Status On-going   Target Date 01/06/17     PT LONG TERM GOAL #4   Title Pt will negotiate ramps & curbs with LRAD with supervision from husband to improve community ambulation   Time 8   Period Weeks   Status On-going   Target Date 01/06/17     PT LONG TERM GOAL #5   Title Patient ambulates 28' around furniture with LRAD modified independent to enable safe household mobility.    Time 8   Period Weeks   Status On-going   Target Date 01/06/17     PT LONG TERM GOAL #6   Title Patient & husband verbalize & demonstrate understanding of ongoing HEP / fitness plan.    Time 8   Period Weeks   Status On-going   Target Date 01/06/17        12/19/16 1452  Plan  Clinical  Impression Statement Today's skilled session continued to address balance with emphasis on complaint surfaces and/or with vision removed. Pt is progressing toward goals and should benefit from continued PT to progress toward unmet goals.   Pt will benefit from skilled therapeutic intervention in order to improve on the following deficits Abnormal gait;Cardiopulmonary status limiting activity;Decreased activity tolerance;Decreased balance;Decreased mobility;Decreased  endurance;Decreased strength;Difficulty walking;Dizziness;Postural dysfunction  Rehab Potential Good  PT Frequency 2x / week  PT Duration 8 weeks  PT Treatment/Interventions ADLs/Self Care Home Management;Neuromuscular re-education;Balance training;Therapeutic exercise;Therapeutic activities;Functional mobility training;Stair training;Gait training;DME Instruction;Patient/family education;Energy conservation  PT Next Visit Plan G-code next visit; continue to work on LE strengthening and balance;  gait with rollator including ramps & curbs, outside if weather permits.   Consulted and Agree with Plan of Care Patient          Patient will benefit from skilled therapeutic intervention in order to improve the following deficits and impairments:  Abnormal gait, Cardiopulmonary status limiting activity, Decreased activity tolerance, Decreased balance, Decreased mobility, Decreased endurance, Decreased strength, Difficulty walking, Dizziness, Postural dysfunction  Visit Diagnosis: Other abnormalities of gait and mobility  Unsteadiness on feet  Muscle weakness (generalized)  History of falling     Problem List Patient Active Problem List   Diagnosis Date Noted  . GERD (gastroesophageal reflux disease) 10/19/2016  . Rectal sphincter spasm 05/26/2016  . Overactive bladder-urinary frequency 05/26/2016  . Bilateral low back pain 04/27/2016  . Generalized anxiety disorder 04/27/2016  . Hyperlipidemia 02/24/2016  . Osteoporosis  02/24/2016  . Essential hypertension, benign 12/18/2015  . Hypertensive urgency 11/14/2015  . COPD with emphysema ([place]) 05/22/2015  . COPD with asthma ([place]) 05/22/2015  . Malnutrition of moderate degree 02/09/2015  . Abdominal pain 02/05/2015  . Kidney stone on right side 02/05/2015  . Colitis 02/05/2015  . Unstable gait 04/22/2013    Willow Ora, PTA, [facility] [address], [place], [place] [address] [phone] 12/21/16, 8:14 AM   Name: Gina Fritz MRN: [id number] Date of Birth: 1940-10-01

## 2016-12-22 ENCOUNTER — Encounter (INDEPENDENT_AMBULATORY_CARE_PROVIDER_SITE_OTHER): Payer: Medicare Other | Admitting: Ophthalmology

## 2016-12-22 ENCOUNTER — Encounter (HOSPITAL_COMMUNITY)
Admission: RE | Admit: 2016-12-22 | Discharge: 2016-12-22 | Disposition: A | Discharge status: Home / Self Care | Payer: Medicare Other | Source: Ambulatory Visit | Attending: Pulmonary Disease | Admitting: Pulmonary Disease

## 2016-12-22 DIAGNOSIS — R2689 Other abnormalities of gait and mobility: Secondary | ICD-10-CM | POA: Diagnosis not present

## 2016-12-22 NOTE — Therapy (Signed)
[facility] [address] [place], [place], [address] Phone: [phone]   Fax:  [phone]  Physical Therapy Treatment  Patient Details  Name: Gina Fritz MRN: [id number] Date of Birth: May 01, 1940 Referring Provider: Betty G Martinique, MD  Encounter Date: 12/21/2016      PT End of Session - 12/21/16 1457    Visit Number 10   Number of Visits 17   Date for PT Re-Evaluation 01/06/17   Authorization Type Medicare G-code & progress note every 10th visit   PT Start Time 1448   PT Stop Time 1530   PT Time Calculation (min) 42 min   Equipment Utilized During Treatment Gait belt   Activity Tolerance Patient tolerated treatment well   Behavior During Therapy [facility] for tasks assessed/performed      Past Medical History:  Diagnosis Date  . Allergic rhinitis   . Anxiety   . CAP (community acquired pneumonia)    dx 02-05-2015  . Colitis    dx 02-05-2015  . COPD with emphysema ([place])   . Depression   . Dizziness   . GERD (gastroesophageal reflux disease)   . History of chronic sinusitis   . History of kidney stones   . Hyperlipidemia   . Hypertension   . Irritable bowel syndrome   . Loss of weight   . Pulmonary nodules    benign and stable per CT  . Right ureteral stone   . Sensorineural hearing loss, unilateral   . Subjective tinnitus     Past Surgical History:  Procedure Laterality Date  . CATARACT EXTRACTION Left   . CYSTO/  LEFT URETEROSCOPIC STONE EXTRACTION/ STENT PLACEMENT  08-10-2009  . ECTOPIC PREGNANCY SURGERY     and Appendectomy  . NASAL SINUS SURGERY    . REMOVAL CYST RIGHT ARM  age 66  . TUBAL LIGATION      There were no vitals filed for this visit.      Subjective Assessment - 12/21/16 1455    Subjective Was able to walk 12 laps, then did UBE and ended with track again, however only made it 7 laps this time, all while using rollator. Was her 1st time trying to do 2 sets on track.  Unsure if she is using the rollator correctely.   Patient is accompained by: Family member   Pertinent History History of falls, syncope/dizziness, COPD w emphysema, anxiety/depression, HTN, IBS/colitis, hx of low back pain/artiritis, osteoporosis   Limitations Walking;Standing;House hold activities   Patient Stated Goals To be able to walk in home & community without fear of falling.    Currently in Pain? No/denies             [facility] Adult PT Treatment/Exercise - 12/21/16 1459      Transfers   Transfers Sit to Stand;Stand to Sit   Sit to Stand 5: Supervision;With upper extremity assist;With armrests;From chair/3-in-1   Stand to Sit 5: Supervision;With upper extremity assist;With armrests;To chair/3-in-1     Ambulation/Gait   Ambulation/Gait Yes   Ambulation/Gait Assistance 5: Supervision;4: Min guard   Ambulation/Gait Assistance Details cues for correct hand position on handles/brakes of rollator   Ambulation Distance (Feet) 300 Feet   Assistive device Rollator   Gait Pattern Step-through pattern;Decreased arm swing - left;Decreased stride length;Shuffle;Lateral hip instability;Trunk flexed   Ambulation Surface Level;Unlevel;Indoor;Outdoor;Paved   Curb Other (comment)  min guard assist   Curb Details (indicate cue type and reason) with rollator on outdoor curb with cues for sequencing  and technique     High Level Balance   High Level Balance Activities Side stepping;Marching forwards;Marching backwards;Tandem walking  tandem fwd/bwd   High Level Balance Comments on both red mats next to counter top: performed 3 laps each way with intermittent UE support. min guard to min assist for balance with cues on posture, ex form and weight shifitng for balance.              PT Short Term Goals - 12/07/16 2050      PT SHORT TERM GOAL #1   Title Pt will ambulate 150' with supervision and LRAD to improve ambulation and exhibit increased endurance   Baseline MET 12/07/16 with  rollator walker   Time 4   Period Weeks   Status Achieved     PT SHORT TERM GOAL #2   Title Patient will perform sit to/from stand transfers from chairs with armrests with external support to stabilize with supervision.    Baseline MET 12/07/16   Time 4   Period Weeks   Status Achieved     PT SHORT TERM GOAL #3   Title Pt will stand statically for 30 seconds without UE support with supervision to improve functional independence   Baseline MET 12/07/16   Time 4   Period Weeks   Status Achieved     PT SHORT TERM GOAL #4   Title Pt will reach forward at least 5" & to floor with  UE assist with supervision to improve balance during functional activites   Baseline MET 12/07/16   Time 4   Period Weeks   Status Achieved     PT SHORT TERM GOAL #5   Title Patient & husband verbalize & demonstrate understanding of initial HEP.    Baseline MET 12/07/16   Time 4   Period Weeks   Status Achieved           PT Long Term Goals - 11/23/16 2029      PT LONG TERM GOAL #1   Title Pt will improve ABC score to 15.6%   Baseline 5.6% on 11/10/16   Time 8   Period Weeks   Status On-going   Target Date 01/06/17     PT LONG TERM GOAL #2   Title Pt will ambulate 300' with supervision from husband & LRAD to improve endurance and improve community ambulation   Time 8   Period Weeks   Status On-going   Target Date 01/06/17     PT LONG TERM GOAL #3   Title Pt will improve Berg score to >/= 20/56 to reduce risk of falls   Baseline 9/56 on 11/10/16   Time 8   Period Weeks   Status On-going   Target Date 01/06/17     PT LONG TERM GOAL #4   Title Pt will negotiate ramps & curbs with LRAD with supervision from husband to improve community ambulation   Time 8   Period Weeks   Status On-going   Target Date 01/06/17     PT LONG TERM GOAL #5   Title Patient ambulates 73' around furniture with LRAD modified independent to enable safe household mobility.    Time 8   Period Weeks   Status On-going    Target Date 01/06/17     PT LONG TERM GOAL #6   Title Patient & husband verbalize & demonstrate understanding of ongoing HEP / fitness plan.    Time 8   Period Weeks   Status On-going  Target Date 01/06/17               Plan - 14-Jan-2017 1458    Clinical Impression Statement Today's skilled session addressed use of rollator on outdoor unlevel surfaces/curbs and continued to address high level balance activities. Pt does fatigue quickly and needed short, seated rest breaks throughout session. Pt is progressing toward goals and should benefit from continued PT to progress toward unmet goals.   Rehab Potential Good   PT Frequency 2x / week   PT Duration 8 weeks   PT Treatment/Interventions ADLs/Self Care Home Management;Neuromuscular re-education;Balance training;Therapeutic exercise;Therapeutic activities;Functional mobility training;Stair training;Gait training;DME Instruction;Patient/family education;Energy conservation   PT Next Visit Plan continue to work on LE strengthening and balance   Consulted and Agree with Plan of Care Patient      Patient will benefit from skilled therapeutic intervention in order to improve the following deficits and impairments:  Abnormal gait, Cardiopulmonary status limiting activity, Decreased activity tolerance, Decreased balance, Decreased mobility, Decreased endurance, Decreased strength, Difficulty walking, Dizziness, Postural dysfunction  Visit Diagnosis: Other abnormalities of gait and mobility  Unsteadiness on feet  Muscle weakness (generalized)    January 14, 2017 1630  PT G-Codes  Functional Assessment Tool Used (Outpatient Only) sit/stand Mod I with UE support, no longer demo's retropulsion to stand; pt able to stand statically for >30 sec's no UE support with supervision on 1st attempt  Functional Limitation Changing and maintaining body position     Problem List Patient Active Problem List   Diagnosis Date Noted  . GERD  (gastroesophageal reflux disease) 10/19/2016  . Rectal sphincter spasm 05/26/2016  . Overactive bladder-urinary frequency 05/26/2016  . Bilateral low back pain 04/27/2016  . Generalized anxiety disorder 04/27/2016  . Hyperlipidemia 02/24/2016  . Osteoporosis 02/24/2016  . Essential hypertension, benign 12/18/2015  . Hypertensive urgency 11/14/2015  . COPD with emphysema ([place]) 05/22/2015  . COPD with asthma ([place]) 05/22/2015  . Malnutrition of moderate degree 02/09/2015  . Abdominal pain 02/05/2015  . Kidney stone on right side 02/05/2015  . Colitis 02/05/2015  . Unstable gait 04/22/2013    Willow Ora, PTA, [facility] [address] [place], [place] [address] [phone] 2017/01/15, 10:11 AM   Name: Gina Fritz MRN: [id number] Date of Birth: [birth year]       G-Codes - 2017/01/15 1008    Functional Assessment Tool Used (Outpatient Only) sit/stand Mod I with UE support, no longer demo's retropulsion to stand; pt able to stand statically for >30 sec's no UE support with supervision on 1st attempt   Functional Limitation Changing and maintaining body position   Changing and Maintaining Body Position Current Status [phone]) At least 60 percent but less than 80 percent impaired, limited or restricted   Changing and Maintaining Body Position Goal Status [phone]) At least 40 percent but less than 60 percent impaired, limited or restricted     Physical Therapy Progress Note  Dates of Reporting Period: 11/09/16 to 01-15-17  Objective Reports of Subjective Statement: Patient reports improved function with PT instructions.   Objective Measurements: See above  Goal Update: See above  Plan: Continue established plan of care.  Reason Skilled Services are Required: Patient continues to have high fall risk and is progressing with skilled PT instruction.  Jamey Reas, PT, DPT PT Specializing in [place] January 15, 2017 9:57 PM Phone:  [phone]  Fax:  [phone] [facility] [address] [place], [place] [address]

## 2016-12-23 ENCOUNTER — Ambulatory Visit: Payer: Medicare Other | Admitting: Internal Medicine

## 2016-12-26 ENCOUNTER — Ambulatory Visit: Payer: Medicare Other | Admitting: Physical Therapy

## 2016-12-27 ENCOUNTER — Encounter (HOSPITAL_COMMUNITY): Payer: Medicare Other

## 2016-12-28 ENCOUNTER — Ambulatory Visit: Payer: Medicare Other | Admitting: Physical Therapy

## 2016-12-29 ENCOUNTER — Encounter (HOSPITAL_COMMUNITY): Payer: Medicare Other

## 2017-01-02 ENCOUNTER — Ambulatory Visit: Payer: Medicare Other | Attending: Family Medicine | Admitting: Physical Therapy

## 2017-01-02 ENCOUNTER — Encounter: Payer: Self-pay | Admitting: Physical Therapy

## 2017-01-02 ENCOUNTER — Telehealth: Payer: Self-pay | Admitting: *Deleted

## 2017-01-02 DIAGNOSIS — Z9181 History of falling: Secondary | ICD-10-CM | POA: Diagnosis present

## 2017-01-02 DIAGNOSIS — R2681 Unsteadiness on feet: Secondary | ICD-10-CM | POA: Insufficient documentation

## 2017-01-02 DIAGNOSIS — R2689 Other abnormalities of gait and mobility: Secondary | ICD-10-CM | POA: Diagnosis present

## 2017-01-02 DIAGNOSIS — M6281 Muscle weakness (generalized): Secondary | ICD-10-CM | POA: Diagnosis present

## 2017-01-02 NOTE — Telephone Encounter (Signed)
Patient called requesting to speak only with the nurse regarding medications. Please call the patient [phone]

## 2017-01-03 ENCOUNTER — Encounter (HOSPITAL_COMMUNITY)
Admission: RE | Admit: 2017-01-03 | Discharge: 2017-01-03 | Disposition: A | Discharge status: Home / Self Care | Payer: Self-pay | Source: Ambulatory Visit | Attending: Pulmonary Disease | Admitting: Pulmonary Disease

## 2017-01-03 DIAGNOSIS — J449 Chronic obstructive pulmonary disease, unspecified: Secondary | ICD-10-CM | POA: Insufficient documentation

## 2017-01-03 MED ORDER — ESCITALOPRAM OXALATE 20 MG PO TABS: 20.0000 mg | ORAL_TABLET | Freq: Every day | ORAL | 1 refills | Status: DC

## 2017-01-03 NOTE — Telephone Encounter (Signed)
I called and spoke with patient's husband. I advised him I would go ahead and send patient's escitalopram into the pharmacy. He was wanting to know if we can refill the promethazine tablets because patient has run out, and they like to have them on hand just incase she experiences any nausea/vomiting spells. I advised him I would have to send a message to you on the promethazine to see if you are okay with filling it.  Thanks!

## 2017-01-03 NOTE — Therapy (Signed)
[facility] [address] [place], [place], [address] Phone: [phone]   Fax:  [phone]  Physical Therapy Treatment  Patient Details  Name: Gina Fritz MRN: [id number] Date of Birth: 1940/09/27 Referring Provider: Betty G Martinique, MD  Encounter Date: 01/02/2017      PT End of Session - 01/02/17 1453    Visit Number 11   Number of Visits 17   Date for PT Re-Evaluation 01/06/17   Authorization Type Medicare G-code & progress note every 10th visit   PT Start Time 1448   PT Stop Time 1530   PT Time Calculation (min) 42 min   Equipment Utilized During Treatment Gait belt   Activity Tolerance Patient tolerated treatment well   Behavior During Therapy [facility] for tasks assessed/performed      Past Medical History:  Diagnosis Date  . Allergic rhinitis   . Anxiety   . CAP (community acquired pneumonia)    dx 02-05-2015  . Colitis    dx 02-05-2015  . COPD with emphysema ([place])   . Depression   . Dizziness   . GERD (gastroesophageal reflux disease)   . History of chronic sinusitis   . History of kidney stones   . Hyperlipidemia   . Hypertension   . Irritable bowel syndrome   . Loss of weight   . Pulmonary nodules    benign and stable per CT  . Right ureteral stone   . Sensorineural hearing loss, unilateral   . Subjective tinnitus     Past Surgical History:  Procedure Laterality Date  . CATARACT EXTRACTION Left   . CYSTO/  LEFT URETEROSCOPIC STONE EXTRACTION/ STENT PLACEMENT  08-10-2009  . ECTOPIC PREGNANCY SURGERY     and Appendectomy  . NASAL SINUS SURGERY    . REMOVAL CYST RIGHT ARM  age 34  . TUBAL LIGATION      There were no vitals filed for this visit.      Subjective Assessment - 01/02/17 1452    Subjective No new complaints. No falls or pain to report. Does have her new rollator with her for [place] to ajust today.    Patient is accompained by: Family member   Pertinent History History of  falls, syncope/dizziness, COPD w emphysema, anxiety/depression, HTN, IBS/colitis, hx of low back pain/artiritis, osteoporosis   Limitations Walking;Standing;House hold activities   Patient Stated Goals To be able to walk in home & community without fear of falling.    Currently in Pain? No/denies            [facility] PT Assessment - 01/02/17 1455      Transfers   Transfers Sit to Stand;Stand to Sit     Ambulation/Gait   Ambulation/Gait Yes   Ambulation/Gait Assistance 5: Supervision   Ambulation/Gait Assistance Details demo'd safe technique with no cues or assistance needed   Ambulation Distance (Feet) 325 Feet  x1   Assistive device Rollator   Gait Pattern Step-through pattern;Decreased arm swing - left;Decreased stride length;Shuffle;Lateral hip instability;Trunk flexed   Ambulation Surface Level;Indoor   Gait velocity 17.94 sec's= 1.82 ft/sec with rollator   Gait Comments pt's personal rollator adjusted down to be at correct height for pt.      Berg Balance Test   Sit to Stand Able to stand without using hands and stabilize independently   Standing Unsupported Able to stand safely 2 minutes   Sitting with Back Unsupported but Feet Supported on Floor or Stool Able to sit safely and  securely 2 minutes   Stand to Sit Sits safely with minimal use of hands   Transfers Able to transfer safely, minor use of hands   Standing Unsupported with Eyes Closed Able to stand 10 seconds with supervision   Standing Ubsupported with Feet Together Able to place feet together independently and stand for 1 minute with supervision   From Standing, Reach Forward with Outstretched Arm Can reach forward >12 cm safely (5")  8 inches   From Standing Position, Pick up Object from Floor Able to pick up shoe, needs supervision   From Standing Position, Turn to Look Behind Over each Shoulder Looks behind one side only/other side shows less weight shift  left> right   Turn 360 Degrees Able to turn 360 degrees  safely but slowly  >6 sec's   Standing Unsupported, Alternately Place Feet on Step/Stool Able to stand independently and complete 8 steps >20 seconds   Standing Unsupported, One Foot in Front Able to place foot tandem independently and hold 30 seconds   Standing on One Leg Tries to lift leg/unable to hold 3 seconds but remains standing independently   Total Score 45             PT Short Term Goals - 12/07/16 2050      PT SHORT TERM GOAL #1   Title Pt will ambulate 150' with supervision and LRAD to improve ambulation and exhibit increased endurance   Baseline MET 12/07/16 with rollator walker   Time 4   Period Weeks   Status Achieved     PT SHORT TERM GOAL #2   Title Patient will perform sit to/from stand transfers from chairs with armrests with external support to stabilize with supervision.    Baseline MET 12/07/16   Time 4   Period Weeks   Status Achieved     PT SHORT TERM GOAL #3   Title Pt will stand statically for 30 seconds without UE support with supervision to improve functional independence   Baseline MET 12/07/16   Time 4   Period Weeks   Status Achieved     PT SHORT TERM GOAL #4   Title Pt will reach forward at least 5" & to floor with  UE assist with supervision to improve balance during functional activites   Baseline MET 12/07/16   Time 4   Period Weeks   Status Achieved     PT SHORT TERM GOAL #5   Title Patient & husband verbalize & demonstrate understanding of initial HEP.    Baseline MET 12/07/16   Time 4   Period Weeks   Status Achieved           PT Long Term Goals - 01/02/17 1454      PT LONG TERM GOAL #1   Title Pt will improve ABC score to 15.6%   Baseline 01/02/17: 39.4% scored today   Time --   Period --   Status Achieved     PT LONG TERM GOAL #2   Title Pt will ambulate 300' with supervision from husband & LRAD to improve endurance and improve community ambulation   Baseline 01/02/17: met with PTA supervision as spouse did not attend  session   Time --   Period --   Status Achieved     PT LONG TERM GOAL #3   Title Pt will improve Berg score to >/= 20/56 to reduce risk of falls   Baseline 01/02/17: 45/56 scored today   Time --   Period --  Status Achieved     PT LONG TERM GOAL #4   Title Pt will negotiate ramps & curbs with LRAD with supervision from husband to improve community ambulation   Time 8   Period Weeks   Status On-going     PT LONG TERM GOAL #5   Title Patient ambulates 33' around furniture with LRAD modified independent to enable safe household mobility.    Time 8   Period Weeks   Status On-going     PT LONG TERM GOAL #6   Title Patient & husband verbalize & demonstrate understanding of ongoing HEP / fitness plan.    Time 8   Period Weeks   Status On-going            Plan - 01/02/17 1454    Clinical Impression Statement Pt has met 3/6 LTGs to date. Will plan to assess remaining LTGs at next session with anticipated discharge if remaining goals are met.   Rehab Potential Good   PT Frequency 2x / week   PT Duration 8 weeks   PT Treatment/Interventions ADLs/Self Care Home Management;Neuromuscular re-education;Balance training;Therapeutic exercise;Therapeutic activities;Functional mobility training;Stair training;Gait training;DME Instruction;Patient/family education;Energy conservation   PT Next Visit Plan assess remaining LTGs for anticipated discharge if met per plan of care; FOTO completed this session- needs to be discharged out of [facility] system.    Consulted and Agree with Plan of Care Patient      Patient will benefit from skilled therapeutic intervention in order to improve the following deficits and impairments:  Abnormal gait, Cardiopulmonary status limiting activity, Decreased activity tolerance, Decreased balance, Decreased mobility, Decreased endurance, Decreased strength, Difficulty walking, Dizziness, Postural dysfunction  Visit Diagnosis: Other abnormalities of gait and  mobility  Unsteadiness on feet  Muscle weakness (generalized)     Problem List Patient Active Problem List   Diagnosis Date Noted  . GERD (gastroesophageal reflux disease) 10/19/2016  . Rectal sphincter spasm 05/26/2016  . Overactive bladder-urinary frequency 05/26/2016  . Bilateral low back pain 04/27/2016  . Generalized anxiety disorder 04/27/2016  . Hyperlipidemia 02/24/2016  . Osteoporosis 02/24/2016  . Essential hypertension, benign 12/18/2015  . Hypertensive urgency 11/14/2015  . COPD with emphysema ([place]) 05/22/2015  . COPD with asthma ([place]) 05/22/2015  . Malnutrition of moderate degree 02/09/2015  . Abdominal pain 02/05/2015  . Kidney stone on right side 02/05/2015  . Colitis 02/05/2015  . Unstable gait 04/22/2013    Willow Ora, PTA, [facility] [address] [place], [place] [address] [phone] 01/03/17, 12:11 PM   Name: Gina Fritz MRN: [id number] Date of Birth: Jun 21, 1940

## 2017-01-03 NOTE — Telephone Encounter (Signed)
Richele Strand Spouse Hysham stopped by to get medication refills for [facility] for  escitalopram (LEXAPRO) 20 MG tablet promethazine (PHENERGAN) injection 12.5 mg  Please call and advise

## 2017-01-04 ENCOUNTER — Encounter: Payer: Self-pay | Admitting: Physical Therapy

## 2017-01-04 ENCOUNTER — Ambulatory Visit: Payer: Medicare Other | Admitting: Physical Therapy

## 2017-01-04 DIAGNOSIS — M6281 Muscle weakness (generalized): Secondary | ICD-10-CM

## 2017-01-04 DIAGNOSIS — R2681 Unsteadiness on feet: Secondary | ICD-10-CM

## 2017-01-04 DIAGNOSIS — Z9181 History of falling: Secondary | ICD-10-CM

## 2017-01-04 DIAGNOSIS — R2689 Other abnormalities of gait and mobility: Secondary | ICD-10-CM | POA: Diagnosis not present

## 2017-01-04 NOTE — Therapy (Signed)
[facility] [address] [place], [place], [address] Phone: [phone]   Fax:  [phone]  Physical Therapy Treatment  Patient Details  Name: Gina Fritz MRN: [id number] Date of Birth: 1940/11/21 Referring Provider: Betty G Martinique, MD  Encounter Date: 01/04/2017      PT End of Session - 01/04/17 2255    Visit Number 12   Number of Visits 17   Date for PT Re-Evaluation 01/06/17   Authorization Type Medicare G-code & progress note every 10th visit   PT Start Time 1449   PT Stop Time 1530   PT Time Calculation (min) 41 min   Equipment Utilized During Treatment Gait belt   Activity Tolerance Patient tolerated treatment well   Behavior During Therapy [facility] for tasks assessed/performed      Past Medical History:  Diagnosis Date  . Allergic rhinitis   . Anxiety   . CAP (community acquired pneumonia)    dx 02-05-2015  . Colitis    dx 02-05-2015  . COPD with emphysema ([place])   . Depression   . Dizziness   . GERD (gastroesophageal reflux disease)   . History of chronic sinusitis   . History of kidney stones   . Hyperlipidemia   . Hypertension   . Irritable bowel syndrome   . Loss of weight   . Pulmonary nodules    benign and stable per CT  . Right ureteral stone   . Sensorineural hearing loss, unilateral   . Subjective tinnitus     Past Surgical History:  Procedure Laterality Date  . CATARACT EXTRACTION Left   . CYSTO/  LEFT URETEROSCOPIC STONE EXTRACTION/ STENT PLACEMENT  08-10-2009  . ECTOPIC PREGNANCY SURGERY     and Appendectomy  . NASAL SINUS SURGERY    . REMOVAL CYST RIGHT ARM  age 35  . TUBAL LIGATION      There were no vitals filed for this visit.      Subjective Assessment - 01/04/17 1456    Subjective No falls. She has been using rollator walker. She was sick last week and was not able to do as much at pulmonary rehab.    Pertinent History History of falls, syncope/dizziness, COPD  w emphysema, anxiety/depression, HTN, IBS/colitis, hx of low back pain/artiritis, osteoporosis   Limitations Walking;Standing;House hold activities   Patient Stated Goals To be able to walk in home & community without fear of falling.    Currently in Pain? No/denies                         [facility] Adult PT Treatment/Exercise - 01/04/17 1450      Transfers   Transfers Sit to Stand;Stand to [employer] Transfers   Sit to Stand 6: Modified independent (Device/Increase time);With upper extremity assist;From chair/3-in-1   Stand to Sit 6: Modified independent (Device/Increase time);With upper extremity assist;To chair/3-in-1   Stand Pivot Transfers 6: Modified independent (Device/Increase time)  180* turn to sit on rollator seat     Ambulation/Gait   Ambulation/Gait Yes   Ambulation/Gait Assistance 6: Modified independent (Device/Increase time)   Ambulation/Gait Assistance Details 6-Minute Walk Test with Rollator Walker. SpO2 85% HR 74bpm at end of test. SpO2 recovered to >90% within 30 seconds of seated rest.    Ambulation Distance (Feet) 783 Feet   Assistive device Rollator   Ambulation Surface Indoor;Level   Ramp 6: Modified independent (Device)  rollator walker   Curb 6: Modified independent (Device/increase  time)  rollator walker                PT Education - 01/04/17 1445    Education provided Yes   Education Details Need for ongoing fitness plan / HEP. When ill like last week, resume exercises once recovers at slower rate.    Person(s) Educated Patient   Methods Explanation;Verbal cues   Comprehension Verbalized understanding          PT Short Term Goals - 12/07/16 2050      PT SHORT TERM GOAL #1   Title Pt will ambulate 150' with supervision and LRAD to improve ambulation and exhibit increased endurance   Baseline MET 12/07/16 with rollator walker   Time 4   Period Weeks   Status Achieved     PT SHORT TERM GOAL #2   Title Patient will  perform sit to/from stand transfers from chairs with armrests with external support to stabilize with supervision.    Baseline MET 12/07/16   Time 4   Period Weeks   Status Achieved     PT SHORT TERM GOAL #3   Title Pt will stand statically for 30 seconds without UE support with supervision to improve functional independence   Baseline MET 12/07/16   Time 4   Period Weeks   Status Achieved     PT SHORT TERM GOAL #4   Title Pt will reach forward at least 5" & to floor with  UE assist with supervision to improve balance during functional activites   Baseline MET 12/07/16   Time 4   Period Weeks   Status Achieved     PT SHORT TERM GOAL #5   Title Patient & husband verbalize & demonstrate understanding of initial HEP.    Baseline MET 12/07/16   Time 4   Period Weeks   Status Achieved           PT Long Term Goals - 01/04/17 2255      PT LONG TERM GOAL #1   Title Pt will improve ABC score to 15.6%   Baseline 01/02/17: 39.4% scored today   Status Achieved     PT LONG TERM GOAL #2   Title Pt will ambulate 300' with supervision from husband & LRAD to improve endurance and improve community ambulation   Baseline MET 01/04/17 at modified indepedent level.   Status Achieved     PT LONG TERM GOAL #3   Title Pt will improve Berg score to >/= 20/56 to reduce risk of falls   Baseline 01/02/17: 45/56 scored today   Status Achieved     PT LONG TERM GOAL #4   Title Pt will negotiate ramps & curbs with LRAD with supervision from husband to improve community ambulation   Baseline MET 01/04/17 with rollator walker   Time 8   Period Weeks   Status Achieved     PT LONG TERM GOAL #5   Title Patient ambulates 34' around furniture with LRAD modified independent to enable safe household mobility.    Baseline MET 01/04/17 with cane   Time 8   Period Weeks   Status Achieved     PT LONG TERM GOAL #6   Title Patient & husband verbalize & demonstrate understanding of ongoing HEP / fitness plan.     Baseline MET 01/04/17   Time 8   Period Weeks   Status Achieved               Plan - 01/04/17 2257  Clinical Impression Statement Patient met 6 of 6 LTGs. She appears to have improved mobility & safety with skilled PT instruction. She purchased a rollator walker and appears to understand how to use to improve community mobility and safety.    Rehab Potential Good   PT Frequency 2x / week   PT Duration 8 weeks   PT Treatment/Interventions ADLs/Self Care Home Management;Neuromuscular re-education;Balance training;Therapeutic exercise;Therapeutic activities;Functional mobility training;Stair training;Gait training;DME Instruction;Patient/family education;Energy conservation   PT Next Visit Plan discharge PT   Consulted and Agree with Plan of Care Patient      Patient will benefit from skilled therapeutic intervention in order to improve the following deficits and impairments:  Abnormal gait, Cardiopulmonary status limiting activity, Decreased activity tolerance, Decreased balance, Decreased mobility, Decreased endurance, Decreased strength, Difficulty walking, Dizziness, Postural dysfunction  Visit Diagnosis: Other abnormalities of gait and mobility  Unsteadiness on feet  Muscle weakness (generalized)  History of falling       G-Codes - 01-31-17 2300    Functional Assessment Tool Used (Outpatient Only) sit to/from stand with UE support modified independent. Berg Balance 45/56.    Functional Limitation Changing and maintaining body position   Changing and Maintaining Body Position Goal Status [phone]) At least 40 percent but less than 60 percent impaired, limited or restricted   Changing and Maintaining Body Position Discharge Status ([id number]) At least 20 percent but less than 40 percent impaired, limited or restricted      Problem List Patient Active Problem List   Diagnosis Date Noted  . GERD (gastroesophageal reflux disease) 10/19/2016  . Rectal sphincter spasm  05/26/2016  . Overactive bladder-urinary frequency 05/26/2016  . Bilateral low back pain 04/27/2016  . Generalized anxiety disorder 04/27/2016  . Hyperlipidemia 02/24/2016  . Osteoporosis 02/24/2016  . Essential hypertension, benign 12/18/2015  . Hypertensive urgency 11/14/2015  . COPD with emphysema ([place]) 05/22/2015  . COPD with asthma ([place]) 05/22/2015  . Malnutrition of moderate degree 02/09/2015  . Abdominal pain 02/05/2015  . Kidney stone on right side 02/05/2015  . Colitis 02/05/2015  . Unstable gait 04/22/2013   PHYSICAL THERAPY DISCHARGE SUMMARY  Visits from Start of Care: 12  Current functional level related to goals / functional outcomes: See above   Remaining deficits: See above   Education / Equipment: Ongoing fitness plan & HEP  Plan: Patient agrees to discharge.  Patient goals were met. Patient is being discharged due to meeting the stated rehab goals.  ?????         Savva Beamer PT, DPT 2017-01-31, 11:01 PM  [facility] [address] [place], [place], [address] Phone: [phone]   Fax:  [phone]  Name: Gina Fritz MRN: [id number] Date of Birth: 07/11/1940

## 2017-01-05 ENCOUNTER — Encounter (INDEPENDENT_AMBULATORY_CARE_PROVIDER_SITE_OTHER): Payer: Medicare Other | Admitting: Ophthalmology

## 2017-01-05 ENCOUNTER — Encounter (HOSPITAL_COMMUNITY): Payer: Medicare Other

## 2017-01-05 DIAGNOSIS — H43813 Vitreous degeneration, bilateral: Secondary | ICD-10-CM

## 2017-01-05 DIAGNOSIS — H353122 Nonexudative age-related macular degeneration, left eye, intermediate dry stage: Secondary | ICD-10-CM

## 2017-01-05 DIAGNOSIS — H35033 Hypertensive retinopathy, bilateral: Secondary | ICD-10-CM

## 2017-01-05 DIAGNOSIS — H353211 Exudative age-related macular degeneration, right eye, with active choroidal neovascularization: Secondary | ICD-10-CM

## 2017-01-05 DIAGNOSIS — I10 Essential (primary) hypertension: Secondary | ICD-10-CM

## 2017-01-09 ENCOUNTER — Ambulatory Visit: Payer: Medicare Other | Admitting: Physical Therapy

## 2017-01-10 ENCOUNTER — Encounter (HOSPITAL_COMMUNITY)
Admission: RE | Admit: 2017-01-10 | Discharge: 2017-01-10 | Disposition: A | Discharge status: Home / Self Care | Payer: Self-pay | Source: Ambulatory Visit | Attending: Pulmonary Disease | Admitting: Pulmonary Disease

## 2017-01-10 NOTE — Telephone Encounter (Signed)
Promethazine has been prescribed in the past when she had acute GI symptoms. It is not a chronic use medication mainly at her age, increases risk of falls among others. If she starts with nausea we need to see her again, she has Hx of GERD but was well controlled at the time of her last OV.  Thanks, BJ

## 2017-01-11 ENCOUNTER — Ambulatory Visit: Payer: Medicare Other | Admitting: Physical Therapy

## 2017-01-11 NOTE — Telephone Encounter (Signed)
I spoke with patient's spouse and went over the information below. Patient's spouse verbalized understanding.

## 2017-01-12 ENCOUNTER — Encounter (HOSPITAL_COMMUNITY)
Admission: RE | Admit: 2017-01-12 | Discharge: 2017-01-12 | Disposition: A | Discharge status: Home / Self Care | Payer: Self-pay | Source: Ambulatory Visit | Attending: Pulmonary Disease | Admitting: Pulmonary Disease

## 2017-01-12 ENCOUNTER — Ambulatory Visit: Payer: Medicare Other

## 2017-01-12 ENCOUNTER — Ambulatory Visit (INDEPENDENT_AMBULATORY_CARE_PROVIDER_SITE_OTHER): Payer: Medicare Other

## 2017-01-12 DIAGNOSIS — Z23 Encounter for immunization: Secondary | ICD-10-CM

## 2017-01-17 ENCOUNTER — Encounter (HOSPITAL_COMMUNITY)
Admission: RE | Admit: 2017-01-17 | Discharge: 2017-01-17 | Disposition: A | Discharge status: Home / Self Care | Payer: Self-pay | Source: Ambulatory Visit | Attending: Pulmonary Disease | Admitting: Pulmonary Disease

## 2017-01-19 ENCOUNTER — Encounter (HOSPITAL_COMMUNITY)
Admission: RE | Admit: 2017-01-19 | Discharge: 2017-01-19 | Disposition: A | Discharge status: Home / Self Care | Payer: Self-pay | Source: Ambulatory Visit | Attending: Pulmonary Disease | Admitting: Pulmonary Disease

## 2017-01-23 ENCOUNTER — Ambulatory Visit: Payer: Medicare Other | Admitting: Physical Therapy

## 2017-01-24 ENCOUNTER — Encounter (HOSPITAL_COMMUNITY)
Admission: RE | Admit: 2017-01-24 | Discharge: 2017-01-24 | Disposition: A | Discharge status: Home / Self Care | Payer: Self-pay | Source: Ambulatory Visit | Attending: Pulmonary Disease | Admitting: Pulmonary Disease

## 2017-01-25 ENCOUNTER — Ambulatory Visit: Payer: Medicare Other | Admitting: Physical Therapy

## 2017-01-26 ENCOUNTER — Encounter (HOSPITAL_COMMUNITY)
Admission: RE | Admit: 2017-01-26 | Discharge: 2017-01-26 | Disposition: A | Discharge status: Home / Self Care | Payer: Self-pay | Source: Ambulatory Visit | Attending: Pulmonary Disease | Admitting: Pulmonary Disease

## 2017-01-31 ENCOUNTER — Encounter (HOSPITAL_COMMUNITY)
Admission: RE | Admit: 2017-01-31 | Discharge: 2017-01-31 | Disposition: A | Discharge status: Home / Self Care | Payer: Self-pay | Source: Ambulatory Visit | Attending: Pulmonary Disease | Admitting: Pulmonary Disease

## 2017-02-01 ENCOUNTER — Other Ambulatory Visit: Payer: Self-pay | Admitting: Family Medicine

## 2017-02-01 DIAGNOSIS — J449 Chronic obstructive pulmonary disease, unspecified: Secondary | ICD-10-CM

## 2017-02-02 ENCOUNTER — Encounter (HOSPITAL_COMMUNITY)
Admission: RE | Admit: 2017-02-02 | Discharge: 2017-02-02 | Disposition: A | Discharge status: Home / Self Care | Payer: Self-pay | Source: Ambulatory Visit | Attending: Pulmonary Disease | Admitting: Pulmonary Disease

## 2017-02-02 DIAGNOSIS — J449 Chronic obstructive pulmonary disease, unspecified: Secondary | ICD-10-CM | POA: Insufficient documentation

## 2017-02-03 ENCOUNTER — Encounter (INDEPENDENT_AMBULATORY_CARE_PROVIDER_SITE_OTHER): Payer: Medicare Other | Admitting: Ophthalmology

## 2017-02-06 ENCOUNTER — Encounter (INDEPENDENT_AMBULATORY_CARE_PROVIDER_SITE_OTHER): Payer: Medicare Other | Admitting: Ophthalmology

## 2017-02-06 DIAGNOSIS — H43813 Vitreous degeneration, bilateral: Secondary | ICD-10-CM | POA: Diagnosis not present

## 2017-02-06 DIAGNOSIS — H353231 Exudative age-related macular degeneration, bilateral, with active choroidal neovascularization: Secondary | ICD-10-CM

## 2017-02-06 DIAGNOSIS — I10 Essential (primary) hypertension: Secondary | ICD-10-CM

## 2017-02-06 DIAGNOSIS — H35033 Hypertensive retinopathy, bilateral: Secondary | ICD-10-CM | POA: Diagnosis not present

## 2017-02-07 ENCOUNTER — Encounter (HOSPITAL_COMMUNITY)
Admission: RE | Admit: 2017-02-07 | Discharge: 2017-02-07 | Disposition: A | Discharge status: Home / Self Care | Payer: Self-pay | Source: Ambulatory Visit | Attending: Pulmonary Disease | Admitting: Pulmonary Disease

## 2017-02-09 ENCOUNTER — Encounter (HOSPITAL_COMMUNITY)
Admission: RE | Admit: 2017-02-09 | Discharge: 2017-02-09 | Disposition: A | Discharge status: Home / Self Care | Payer: Self-pay | Source: Ambulatory Visit | Attending: Pulmonary Disease | Admitting: Pulmonary Disease

## 2017-02-13 ENCOUNTER — Other Ambulatory Visit: Payer: Self-pay | Admitting: Family Medicine

## 2017-02-13 DIAGNOSIS — N3281 Overactive bladder: Secondary | ICD-10-CM

## 2017-02-14 ENCOUNTER — Encounter: Payer: Self-pay | Admitting: Family Medicine

## 2017-02-14 ENCOUNTER — Ambulatory Visit (INDEPENDENT_AMBULATORY_CARE_PROVIDER_SITE_OTHER): Payer: Medicare Other | Admitting: Family Medicine

## 2017-02-14 ENCOUNTER — Encounter (HOSPITAL_COMMUNITY)
Admission: RE | Admit: 2017-02-14 | Discharge: 2017-02-14 | Disposition: A | Discharge status: Home / Self Care | Payer: Self-pay | Source: Ambulatory Visit | Attending: Pulmonary Disease | Admitting: Pulmonary Disease

## 2017-02-14 VITALS — BP 124/77 | HR 60 | Temp 98.0°F | Resp 16 | Ht 62.0 in | Wt 127.4 lb

## 2017-02-14 DIAGNOSIS — M545 Low back pain, unspecified: Secondary | ICD-10-CM

## 2017-02-14 DIAGNOSIS — H353230 Exudative age-related macular degeneration, bilateral, stage unspecified: Secondary | ICD-10-CM

## 2017-02-14 DIAGNOSIS — I10 Essential (primary) hypertension: Secondary | ICD-10-CM

## 2017-02-14 DIAGNOSIS — M810 Age-related osteoporosis without current pathological fracture: Secondary | ICD-10-CM | POA: Diagnosis not present

## 2017-02-14 DIAGNOSIS — K219 Gastro-esophageal reflux disease without esophagitis: Secondary | ICD-10-CM | POA: Diagnosis not present

## 2017-02-14 DIAGNOSIS — B370 Candidal stomatitis: Secondary | ICD-10-CM | POA: Diagnosis not present

## 2017-02-14 DIAGNOSIS — H353290 Exudative age-related macular degeneration, unspecified eye, stage unspecified: Secondary | ICD-10-CM | POA: Insufficient documentation

## 2017-02-14 MED ORDER — RANITIDINE HCL 300 MG PO TABS: 300.0000 mg | ORAL_TABLET | Freq: Every day | ORAL | 3 refills | Status: DC

## 2017-02-14 MED ORDER — ALENDRONATE SODIUM 70 MG PO TABS: 70.0000 mg | ORAL_TABLET | ORAL | 3 refills | Status: AC

## 2017-02-14 MED ORDER — ZOSTER VAC RECOMB ADJUVANTED 50 MCG/0.5ML IM SUSR: INTRAMUSCULAR | 1 refills | Status: DC

## 2017-02-14 NOTE — Patient Instructions (Addendum)
A few things to remember from today's visit:   Essential hypertension, benign  Gastroesophageal reflux disease, esophagitis presence not specified - Plan: ranitidine (ZANTAC) 300 MG tablet  Osteoporosis, unspecified osteoporosis type, unspecified pathological fracture presence - Plan: alendronate (FOSAMAX) 70 MG tablet  Bilateral low back pain without sciatica, unspecified chronicity   Please be sure medication list is accurate. If a new problem present, please set up appointment sooner than planned today.

## 2017-02-14 NOTE — Progress Notes (Signed)
HPI:   Gina Fritz is a 76 y.o. female, who is here today with her husband to follow on some chronic medical problems.  She was last seen on October 19, 2016. Since her last office visit, she has follow-up with pulmonologist and doing pulmonary rehab.  Hypertension:   Currently on Amlodipine 5 mg daily, dose was decreased last visit due to episodes of low BP during PT. she is also on Cozaar 50 mg daily. Last eye examination about a week ago.  She has history of wet bilateral macular degeneration, she is receiving intraocular injections in the left eye, which was recently diagnosed.  BP readings: "Nice", it is checked about twice per week before and after pulmonary rehab. She is taking medications as instructed, no side effects reported.  She has not noted unusual headache, visual changes, exertional chest pain, worsening dyspnea,  focal weakness, or edema.   Lab Results  Component Value Date   CREATININE 0.84 10/19/2016   BUN 12 10/19/2016   NA 142 10/19/2016   K 3.9 10/19/2016   CL 104 10/19/2016   CO2 30 10/19/2016    GERD: Currently she is on Prilosec 20 mg in the morning, she also takes Zantac 300 mg at bedtime.  She ran out of Zantac a few days ago. As far as she is taking both medications she does not have symptoms. Denies abdominal pain, nausea, vomiting, changes in bowel habits, blood in stool or melena.  Concerns today: She needs refills on Fosamax.  History of osteoporosis, she has been taking Fosamax for about a year.  She denies side effects, it does not aggravate GERD. Is not taking calcium supplementation, she takes OTC vitamin D supplementation. She has history of elevated alkaline phosphatase which has been stable for a year or so.  Lab Results  Component Value Date   ALT 15 10/19/2016   AST 20 10/19/2016   ALKPHOS 136 (H) 10/19/2016   BILITOT 0.5 10/19/2016    Chronic lower back pain, she is reporting improvement since her last  office visit.  She takes Tylenol 500 mg if needed. Pain is not radiated, exacerbated by laying on her back, prolonged walking or prolonged standing. Alleviated by rest or position change. She denies saddle anesthesia or changes in bowel/urine incontinence.  She completed PT. She was given a walker, which she uses prn.  She feels like her balance has improved. She has not had any fall since last visit.  She is also wondering if she can get the new shingles vaccine.  She had  Zoster vaccine a few years ago.  She is also reporting recent home visit done by her insurance. According to the patient, no recommendations were given except for increasing physical activity.  Urine and clock test were done, both normal.   Review of Systems  Constitutional: Positive for fatigue. Negative for activity change, appetite change and fever.  HENT: Negative for mouth sores, nosebleeds and trouble swallowing.   Eyes: Negative for redness and visual disturbance.  Respiratory: Positive for shortness of breath (At her baseline). Negative for cough and wheezing.   Cardiovascular: Negative for chest pain, palpitations and leg swelling.  Gastrointestinal: Negative for abdominal pain, nausea and vomiting.       Negative for changes in bowel habits.  Endocrine: Negative for cold intolerance and heat intolerance.  Genitourinary: Negative for decreased urine volume, dysuria and hematuria.  Musculoskeletal: Positive for back pain and gait problem. Negative for joint swelling.  Allergic/Immunologic:  Positive for environmental allergies.  Neurological: Negative for syncope, weakness and headaches.  Psychiatric/Behavioral: Negative for confusion. The patient is nervous/anxious.       Current Outpatient Medications on File Prior to Visit  Medication Sig Dispense Refill  . acetaminophen (TYLENOL) 325 MG tablet Take 2 tablets (650 mg total) by mouth every 6 (six) hours as needed for mild pain (or Fever >/= 101). 30  tablet 1  . albuterol (PROAIR HFA) 108 (90 Base) MCG/ACT inhaler Inhale 2 puffs into the lungs every 6 (six) hours as needed for wheezing or shortness of breath. 1 Inhaler 2  . amLODipine (NORVASC) 5 MG tablet Take 1 tablet (5 mg total) by mouth daily. 90 tablet 1  . aspirin EC 81 MG tablet Take 81 mg by mouth daily.    Marland Kitchen atorvastatin (LIPITOR) 20 MG tablet TAKE 1 TABLET(20 MG) BY MOUTH DAILY 90 tablet 1  . belladona alk-PHENObarbital (DONNATAL) 16.2 MG tablet Take 1 tablet by mouth daily as needed (ibs). No more than 2 tabs per month. 15 tablet 1  . BESIVANCE 0.6 % SUSP Place 1 drop into the right eye See admin instructions. Takes the day before and the day of the dr visit 4 times a day    . Cholecalciferol (VITAMIN D) 2000 UNITS CAPS Take 2,000 Units by mouth daily.    . Cyanocobalamin (VITAMIN B-12 PO) Take 1 tablet by mouth daily.    . DULERA 100-5 MCG/ACT AERO INHALE 2 PUFFS INTO THE LUNGS TWICE DAILY 13 g 4  . escitalopram (LEXAPRO) 20 MG tablet Take 1 tablet (20 mg total) by mouth daily. 90 tablet 1  . fluticasone (FLONASE) 50 MCG/ACT nasal spray SHAKE LIQUID AND USE 2 SPRAYS IN EACH NOSTRIL DAILY 16 g 5  . gabapentin (NEURONTIN) 300 MG capsule TAKE 1 CAPSULE(300 MG) BY MOUTH THREE TIMES DAILY 90 capsule 3  . influenza vac recom quadrivalent (FLUBLOK) 0.5 ML injection Inject 0.5 mLs into the muscle once.    Marland Kitchen losartan (COZAAR) 50 MG tablet TAKE 1 TABLET(50 MG) BY MOUTH TWICE DAILY 180 tablet 1  . montelukast (SINGULAIR) 10 MG tablet TAKE 1 TABLET(10 MG) BY MOUTH DAILY 90 tablet 1  . Multiple Vitamin (MULTIVITAMIN WITH MINERALS) TABS Take 1 tablet by mouth daily.    Marland Kitchen omeprazole (PRILOSEC) 20 MG capsule TAKE ONE CAPSULE BY MOUTH EVERY DAY 90 capsule 2  . polyethylene glycol (MIRALAX / GLYCOLAX) packet Take 17 g by mouth daily as needed for mild constipation.    . VESICARE 10 MG tablet TAKE 1 TABLET(10 MG) BY MOUTH DAILY 90 tablet 0   No current facility-administered medications on file  prior to visit.      Past Medical History:  Diagnosis Date  . Allergic rhinitis   . Anxiety   . CAP (community acquired pneumonia)    dx 02-05-2015  . Colitis    dx 02-05-2015  . COPD with emphysema ([place])   . Depression   . Dizziness   . GERD (gastroesophageal reflux disease)   . History of chronic sinusitis   . History of kidney stones   . Hyperlipidemia   . Hypertension   . Irritable bowel syndrome   . Loss of weight   . Pulmonary nodules    benign and stable per CT  . Right ureteral stone   . Sensorineural hearing loss, unilateral   . Subjective tinnitus    Allergies  Allergen Reactions  . Penicillins Anaphylaxis    Has patient had a PCN reaction  causing immediate rash, facial/tongue/throat swelling, SOB or lightheadedness with hypotension: Yes Has patient had a PCN reaction causing severe rash involving mucus membranes or skin necrosis: Yes Has patient had a PCN reaction that required hospitalization Yes Has patient had a PCN reaction occurring within the last 10 years: No If all of the above answers are "NO", then may proceed with Cephalosporin use.   . Sulfa Antibiotics Hives  . Soy Allergy Itching and Rash    Social History   Socioeconomic History  . Marital status: Married    Spouse name: Gina Fritz  . Number of children: 3  . Years of education: college  . Highest education level: None  Social Needs  . Financial resource strain: None  . Food insecurity - worry: None  . Food insecurity - inability: None  . Transportation needs - medical: None  . Transportation needs - non-medical: None  Occupational History    Comment: Retired  Tobacco Use  . Smoking status: Former Smoker    Packs/day: 1.00    Years: 25.00    Pack years: 25.00    Types: Cigarettes    Last attempt to quit: 02/05/2015    Years since quitting: 2.0  . Smokeless tobacco: Never Used  . Tobacco comment: Thinks her smoking was more than 25 years abut is not exact   Substance and Sexual  Activity  . Alcohol use: Yes    Alcohol/week: 0.6 oz    Types: 1 Glasses of wine per week    Comment: One drink a week.  . Drug use: No  . Sexual activity: None  Other Topics Concern  . None  Social History Narrative   Patient is married Gina Fritz) and lives at home with her husband.   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   Caffeine- Six cups of coffee daily and six cups of coke cola daily.          Vitals:   02/14/17 1352  BP: 124/77  Pulse: 60  Resp: 16  Temp: 98 F (36.7 C)  SpO2: 98%   Body mass index is 23.3 kg/m.   Physical Exam  Nursing note and vitals reviewed. Constitutional: She is oriented to person, place, and time. She appears well-developed. No distress.  HENT:  Head: Normocephalic and atraumatic.  Mouth/Throat: Oropharynx is clear and moist and mucous membranes are normal. She has dentures.  Whitish thin patches scattered on tongue. No tenderness,no ulcers appreciated.  Eyes: Conjunctivae are normal. Pupils are equal, round, and reactive to light.  Cardiovascular: Normal rate and regular rhythm.  No murmur heard. Pulses:      Dorsalis pedis pulses are 2+ on the right side, and 2+ on the left side.  Respiratory: Effort normal and breath sounds normal. No respiratory distress.  GI: Soft. She exhibits no mass. There is no hepatomegaly. There is no tenderness.  Musculoskeletal: She exhibits no edema.       Lumbar back: She exhibits no tenderness and no bony tenderness.  Tenderness upon movement on exam table.  Lymphadenopathy:    She has no cervical adenopathy.  Neurological: She is alert and oriented to person, place, and time. She has normal strength. Coordination normal.  Stable assisted with a cane.  Skin: Skin is warm. No erythema.  Psychiatric: She has a normal mood and affect.  Well groomed, good eye contact.    ASSESSMENT AND PLAN:   Gina Fritz was seen today for follow-up.  Diagnoses and all  orders for this visit:  Gastroesophageal reflux disease, esophagitis presence not specified  Well controlled. GERD precautions recommended. No changes in current management. F/U in 6-12 months.  -     ranitidine (ZANTAC) 300 MG tablet; Take 1 tablet (300 mg total) at bedtime by mouth.  Essential hypertension, benign  Adequately controlled. No changes in current management. DASH-low salt diet to continue Eye exam recommended annually. F/U in 6 months, before if needed.  Osteoporosis, unspecified osteoporosis type, unspecified pathological fracture presence  Tolerating Fosamax well. Side effects discussed. Complete 5 years of treatment. DEXA after completing treatment. Fall precautions. Ca++ and Vit D supplementation recommended.  -     alendronate (FOSAMAX) 70 MG tablet; Take 1 tablet (70 mg total) every Sunday by mouth.  Bilateral low back pain without sciatica, unspecified chronicity  Improved. Avoid activities that aggravate pain. Tylenol to continue and topical Icy Hot recommended prn.  Oral thrush  States that she has a Rx for Nystatin at her pharmacy, called in by her dentists. She is on Dulera inh, some side effects discussed. Rinsing/swishing mouth after use recommended.   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   Other orders -     Zoster Vaccine Adjuvanted [facility]) injection; 0.5 ml in muscle and repeat in 8 weeks     We will plan on fasting lab work next visit.    Betty G. Martinique, MD  [facility]. [place] office.

## 2017-02-16 ENCOUNTER — Encounter (HOSPITAL_COMMUNITY)
Admission: RE | Admit: 2017-02-16 | Discharge: 2017-02-16 | Disposition: A | Discharge status: Home / Self Care | Payer: Medicare Other | Source: Ambulatory Visit | Attending: Pulmonary Disease | Admitting: Pulmonary Disease

## 2017-02-21 ENCOUNTER — Encounter (HOSPITAL_COMMUNITY)
Admission: RE | Admit: 2017-02-21 | Discharge: 2017-02-21 | Disposition: A | Discharge status: Home / Self Care | Payer: Self-pay | Source: Ambulatory Visit | Attending: Pulmonary Disease | Admitting: Pulmonary Disease

## 2017-02-28 ENCOUNTER — Encounter (HOSPITAL_COMMUNITY)
Admission: RE | Admit: 2017-02-28 | Discharge: 2017-02-28 | Disposition: A | Discharge status: Home / Self Care | Payer: Self-pay | Source: Ambulatory Visit | Attending: Pulmonary Disease | Admitting: Pulmonary Disease

## 2017-03-02 ENCOUNTER — Encounter (HOSPITAL_COMMUNITY)
Admission: RE | Admit: 2017-03-02 | Discharge: 2017-03-02 | Disposition: A | Discharge status: Home / Self Care | Payer: Self-pay | Source: Ambulatory Visit | Attending: Pulmonary Disease | Admitting: Pulmonary Disease

## 2017-03-06 ENCOUNTER — Encounter (INDEPENDENT_AMBULATORY_CARE_PROVIDER_SITE_OTHER): Payer: Medicare Other | Admitting: Ophthalmology

## 2017-03-06 DIAGNOSIS — H353231 Exudative age-related macular degeneration, bilateral, with active choroidal neovascularization: Secondary | ICD-10-CM | POA: Diagnosis not present

## 2017-03-06 DIAGNOSIS — H43813 Vitreous degeneration, bilateral: Secondary | ICD-10-CM

## 2017-03-06 DIAGNOSIS — H35033 Hypertensive retinopathy, bilateral: Secondary | ICD-10-CM | POA: Diagnosis not present

## 2017-03-06 DIAGNOSIS — I10 Essential (primary) hypertension: Secondary | ICD-10-CM | POA: Diagnosis not present

## 2017-03-07 ENCOUNTER — Encounter (HOSPITAL_COMMUNITY)
Admission: RE | Admit: 2017-03-07 | Discharge: 2017-03-07 | Disposition: A | Discharge status: Home / Self Care | Payer: Self-pay | Source: Ambulatory Visit | Attending: Pulmonary Disease | Admitting: Pulmonary Disease

## 2017-03-07 DIAGNOSIS — J449 Chronic obstructive pulmonary disease, unspecified: Secondary | ICD-10-CM | POA: Insufficient documentation

## 2017-03-08 ENCOUNTER — Ambulatory Visit (INDEPENDENT_AMBULATORY_CARE_PROVIDER_SITE_OTHER)
Admission: RE | Admit: 2017-03-08 | Discharge: 2017-03-08 | Disposition: A | Discharge status: Home / Self Care | Payer: Medicare Other | Source: Ambulatory Visit | Attending: Pulmonary Disease | Admitting: Pulmonary Disease

## 2017-03-08 DIAGNOSIS — R918 Other nonspecific abnormal finding of lung field: Secondary | ICD-10-CM

## 2017-03-09 ENCOUNTER — Encounter (HOSPITAL_COMMUNITY)
Admission: RE | Admit: 2017-03-09 | Discharge: 2017-03-09 | Disposition: A | Discharge status: Home / Self Care | Payer: Self-pay | Source: Ambulatory Visit | Attending: Pulmonary Disease | Admitting: Pulmonary Disease

## 2017-03-09 ENCOUNTER — Telehealth: Payer: Self-pay | Admitting: Pulmonary Disease

## 2017-03-09 MED ORDER — LEVOFLOXACIN 500 MG PO TABS: 500.0000 mg | ORAL_TABLET | Freq: Every day | ORAL | 0 refills | Status: DC

## 2017-03-09 NOTE — Telephone Encounter (Signed)
CT chest 03/08/17 >> atherosclerosis, advanced centrilobular emphysema, apical scarring, new 2.5 cm opacity RML, 12 mm nodule minor fissure (was 9 mm), other nodules stable   Results d/w pt.  She reports have increased cough with sputum production.    She has allergy to PCN and sulfa.  Will send in script for levaquin.  She has ROV on 03/22/17.

## 2017-03-15 ENCOUNTER — Encounter: Payer: Self-pay | Admitting: Family Medicine

## 2017-03-15 ENCOUNTER — Ambulatory Visit: Payer: Medicare Other | Admitting: Family Medicine

## 2017-03-15 VITALS — BP 126/82 | HR 64 | Temp 97.8°F | Resp 16 | Ht 62.0 in | Wt 125.2 lb

## 2017-03-15 DIAGNOSIS — B370 Candidal stomatitis: Secondary | ICD-10-CM | POA: Diagnosis not present

## 2017-03-15 DIAGNOSIS — H9202 Otalgia, left ear: Secondary | ICD-10-CM | POA: Diagnosis not present

## 2017-03-15 DIAGNOSIS — R911 Solitary pulmonary nodule: Secondary | ICD-10-CM | POA: Diagnosis not present

## 2017-03-15 DIAGNOSIS — H6982 Other specified disorders of Eustachian tube, left ear: Secondary | ICD-10-CM

## 2017-03-15 MED ORDER — NYSTATIN 100000 UNIT/ML MT SUSP: OROMUCOSAL | 1 refills | Status: DC

## 2017-03-15 NOTE — Patient Instructions (Addendum)
A few things to remember from today's visit:   Oral thrush - Plan: nystatin (MYCOSTATIN) 100000 UNIT/ML suspension  Earache symptoms in left ear  Eustachian tube dysfunction, left   Eustachian Tube Dysfunction The eustachian tube connects the middle ear to the back of the nose. It regulates air pressure in the middle ear by allowing air to move between the ear and nose. It also helps to drain fluid from the middle ear space. When the eustachian tube does not function properly, air pressure, fluid, or both can build up in the middle ear. Eustachian tube dysfunction can affect one or both ears. What are the causes? This condition happens when the eustachian tube becomes blocked or cannot open normally. This may result from:  Ear infections.  Colds and other upper respiratory infections.  Allergies.  Irritation, such as from cigarette smoke or acid from the stomach coming up into the esophagus (gastroesophageal reflux).  Sudden changes in air pressure, such as from descending in an airplane.  Abnormal growths in the nose or throat, such as nasal polyps, tumors, or enlarged tissue at the back of the throat (adenoids).  What increases the risk? This condition may be more likely to develop in people who smoke and people who are overweight. Eustachian tube dysfunction may also be more likely to develop in children, especially children who have:  Certain birth defects of the mouth, such as cleft palate.  Large tonsils and adenoids.  What are the signs or symptoms? Symptoms of this condition may include:  A feeling of fullness in the ear.  Ear pain.  Clicking or popping noises in the ear.  Ringing in the ear.  Hearing loss.  Loss of balance.  Symptoms may get worse when the air pressure around you changes, such as when you travel to an area of high elevation or fly on an airplane. How is this diagnosed? This condition may be diagnosed based on:  Your symptoms.  A  physical exam of your ear, nose, and throat.  Tests, such as those that measure: ? The movement of your eardrum (tympanogram). ? Your hearing (audiometry).  How is this treated? Treatment depends on the cause and severity of your condition. If your symptoms are mild, you may be able to relieve your symptoms by moving air into ("popping") your ears. If you have symptoms of fluid in your ears, treatment may include:  Decongestants.  Antihistamines.  Nasal sprays or ear drops that contain medicines that reduce swelling (steroids).  In some cases, you may need to have a procedure to drain the fluid in your eardrum (myringotomy). In this procedure, a small tube is placed in the eardrum to:  Drain the fluid.  Restore the air in the middle ear space.  Follow these instructions at home:  Take over-the-counter and prescription medicines only as told by your health care provider.  Use techniques to help pop your ears as recommended by your health care provider. These may include: ? Chewing gum. ? Yawning. ? Frequent, forceful swallowing. ? Closing your mouth, holding your nose closed, and gently blowing as if you are trying to blow air out of your nose.  Do not do any of the following until your health care provider approves: ? Travel to high altitudes. ? Fly in airplanes. ? Work in a Pension scheme manager or room. ? Scuba dive.  Keep your ears dry. Dry your ears completely after showering or bathing.  Do not smoke.  Keep all follow-up visits as told by  your health care provider. This is important. Contact a health care provider if:  Your symptoms do not go away after treatment.  Your symptoms come back after treatment.  You are unable to pop your ears.  You have: ? A fever. ? Pain in your ear. ? Pain in your head or neck. ? Fluid draining from your ear.  Your hearing suddenly changes.  You become very dizzy.  You lose your balance. This information is not intended to  replace advice given to you by your health care provider. Make sure you discuss any questions you have with your health care provider. Document Released: 04/17/2015 Document Revised: 08/27/2015 Document Reviewed: 04/09/2014 Elsevier Interactive Patient Education  2018 [employer]. Please be sure medication list is accurate. If a new problem present, please set up appointment sooner than planned today.

## 2017-03-15 NOTE — Progress Notes (Signed)
    ACUTE VISIT   HPI:  Chief Complaint  Patient presents with  . Cotton mouth    currently taking an antibiotic  . Otalgia    started a few days ago    [name] is a 76 y.o. female, who is here today with her husband complaining of "cotton" mouth sensation that she noted today in the morning.  She cannot describe sensation, denies odynophagia or dysphagia.  She has not noted oral lesions. She has had oral thrush in the past and recently completed treatment with nystatin solution.  She is currently on Dulera, she has not been consistent with swishing mouth after inh used.  She is also complaining of mild, intermittently left earache.  She denies hearing loss or ear discharge. She has not identified exacerbating or alleviating symptoms. She has not used OTC medication to treat this problem, otherwise stable.  She has had some nasal congestion, rhinorrhea, and postnasal drainage. She denies fever, chills, or body aches. Hx of allergic rhinitis.  She is currently on her 7th day of Levofloxacin. According to patient, abx was started after noticing right lung abnormality (nodular opacity) on recent chest CT scan (03/08/17).  She has not noted worsening cough, exertional dyspnea, or wheezing (Hx of COPD).   Review of Systems  Constitutional: Negative for activity change, appetite change, fatigue and fever.  HENT: Positive for congestion, ear pain and postnasal drip. Negative for mouth sores, sinus pressure, sneezing, sore throat, trouble swallowing and voice change.   Eyes: Negative for discharge, redness and itching.  Respiratory: Positive for cough (occasional,no more than usual) and shortness of breath (at her baseline.). Negative for wheezing.   Gastrointestinal: Negative for abdominal pain, diarrhea, nausea and vomiting.  Musculoskeletal: Negative for back pain, joint swelling, myalgias and neck pain.  Skin: Negative for rash.  Allergic/Immunologic: Negative  for environmental allergies.  Neurological: Negative for weakness, numbness and headaches.  Hematological: Negative for adenopathy. Does not bruise/bleed easily.  Psychiatric/Behavioral: Negative for confusion. The patient is nervous/anxious.       Current Outpatient Medications on File Prior to Visit  Medication Sig Dispense Refill  . acetaminophen (TYLENOL) 325 MG tablet Take 2 tablets (650 mg total) by mouth every 6 (six) hours as needed for mild pain (or Fever >/= 101). 30 tablet 1  . albuterol (PROAIR HFA) 108 (90 Base) MCG/ACT inhaler Inhale 2 puffs into the lungs every 6 (six) hours as needed for wheezing or shortness of breath. 1 Inhaler 2  . alendronate (FOSAMAX) 70 MG tablet Take 1 tablet (70 mg total) every Sunday by mouth. 13 tablet 3  . amLODipine (NORVASC) 5 MG tablet Take 1 tablet (5 mg total) by mouth daily. 90 tablet 1  . aspirin EC 81 MG tablet Take 81 mg by mouth daily.    . atorvastatin (LIPITOR) 20 MG tablet TAKE 1 TABLET(20 MG) BY MOUTH DAILY 90 tablet 1  . belladona alk-PHENObarbital (DONNATAL) 16.2 MG tablet Take 1 tablet by mouth daily as needed (ibs). No more than 2 tabs per month. 15 tablet 1  . BESIVANCE 0.6 % SUSP Place 1 drop into the right eye See admin instructions. Takes the day before and the day of the dr visit 4 times a day    . Blood Pressure Monitoring (ADULT BLOOD PRESSURE CUFF LG) KIT 1 applicator by Miscellaneous route daily.    . Cholecalciferol (VITAMIN D) 2000 UNITS CAPS Take 2,000 Units by mouth daily.    . Cyanocobalamin (VITAMIN B-12   PO) Take 1 tablet by mouth daily.    . DULERA 100-5 MCG/ACT AERO INHALE 2 PUFFS INTO THE LUNGS TWICE DAILY 13 g 4  . escitalopram (LEXAPRO) 20 MG tablet Take 1 tablet (20 mg total) by mouth daily. 90 tablet 1  . fluticasone (FLONASE) 50 MCG/ACT nasal spray SHAKE LIQUID AND USE 2 SPRAYS IN EACH NOSTRIL DAILY 16 g 5  . gabapentin (NEURONTIN) 300 MG capsule TAKE 1 CAPSULE(300 MG) BY MOUTH THREE TIMES DAILY 90 capsule 3    . influenza vac recom quadrivalent (FLUBLOK) 0.5 ML injection Inject 0.5 mLs into the muscle once.    . levofloxacin (LEVAQUIN) 500 MG tablet Take 1 tablet (500 mg total) by mouth daily. 7 tablet 0  . losartan (COZAAR) 50 MG tablet TAKE 1 TABLET(50 MG) BY MOUTH TWICE DAILY 180 tablet 1  . Melatonin 5 MG TABS Take by mouth.    . montelukast (SINGULAIR) 10 MG tablet TAKE 1 TABLET(10 MG) BY MOUTH DAILY 90 tablet 1  . Multiple Vitamin (MULTIVITAMIN WITH MINERALS) TABS Take 1 tablet by mouth daily.    . omeprazole (PRILOSEC) 20 MG capsule TAKE ONE CAPSULE BY MOUTH EVERY DAY 90 capsule 2  . polyethylene glycol (MIRALAX / GLYCOLAX) packet Take 17 g by mouth daily as needed for mild constipation.    . ranitidine (ZANTAC) 300 MG tablet Take 1 tablet (300 mg total) at bedtime by mouth. 90 tablet 3  . VESICARE 10 MG tablet TAKE 1 TABLET(10 MG) BY MOUTH DAILY 90 tablet 0  . Zoster Vaccine Adjuvanted (SHINGRIX) injection 0.5 ml in muscle and repeat in 8 weeks 0.5 mL 1   No current facility-administered medications on file prior to visit.      Past Medical History:  Diagnosis Date  . Allergic rhinitis   . Anxiety   . CAP (community acquired pneumonia)    dx 02-05-2015  . Colitis    dx 02-05-2015  . COPD with emphysema (HCC)   . Depression   . Dizziness   . GERD (gastroesophageal reflux disease)   . History of chronic sinusitis   . History of kidney stones   . Hyperlipidemia   . Hypertension   . Irritable bowel syndrome   . Loss of weight   . Pulmonary nodules    benign and stable per CT  . Right ureteral stone   . Sensorineural hearing loss, unilateral   . Subjective tinnitus    Allergies  Allergen Reactions  . Penicillins Anaphylaxis    Has patient had a PCN reaction causing immediate rash, facial/tongue/throat swelling, SOB or lightheadedness with hypotension: Yes Has patient had a PCN reaction causing severe rash involving mucus membranes or skin necrosis: Yes Has patient had a  PCN reaction that required hospitalization Yes Has patient had a PCN reaction occurring within the last 10 years: No If all of the above answers are "NO", then may proceed with Cephalosporin use.   . Sulfa Antibiotics Hives  . Soy Allergy Itching and Rash    Social History   Socioeconomic History  . Marital status: Married    Spouse name: Gina Fritz  . Number of children: 3  . Years of education: college  . Highest education level: None  Social Needs  . Financial resource strain: None  . Food insecurity - worry: None  . Food insecurity - inability: None  . Transportation needs - medical: None  . Transportation needs - non-medical: None  Occupational History    Comment: Retired  Tobacco Use  .   Smoking status: Former Smoker    Packs/day: 1.00    Years: 25.00    Pack years: 25.00    Types: Cigarettes    Last attempt to quit: 02/05/2015    Years since quitting: 2.1  . Smokeless tobacco: Never Used  . Tobacco comment: Thinks her smoking was more than 25 years abut is not exact   Substance and Sexual Activity  . Alcohol use: Yes    Alcohol/week: 0.6 oz    Types: 1 Glasses of wine per week    Comment: One drink a week.  . Drug use: No  . Sexual activity: None  Other Topics Concern  . None  Social History Narrative   Patient is married (Gina Fritz) and lives at home with her husband.   Retired. Part time [employer].   Education- College education.   Right handed.   Caffeine- Six cups of coffee daily and six cups of coke cola daily.          Vitals:   03/15/17 1506  BP: 126/82  Pulse: 64  Resp: 16  Temp: 97.8 F (36.6 C)  SpO2: 97%   Body mass index is 22.91 kg/m.   Physical Exam  Nursing note and vitals reviewed. Constitutional: She is oriented to person, place, and time. She appears well-developed and well-nourished. She does not appear ill. No distress.  HENT:  Head: Normocephalic and atraumatic.  Right Ear: Hearing, tympanic membrane, external ear and ear  canal normal.  Left Ear: Hearing, external ear and ear canal normal. Tympanic membrane is bulging (Mild). A middle ear effusion is present.  Nose: Right sinus exhibits no maxillary sinus tenderness and no frontal sinus tenderness. Left sinus exhibits no maxillary sinus tenderness and no frontal sinus tenderness.  Mouth/Throat: Uvula is midline, oropharynx is clear and moist and mucous membranes are normal.  Edentulous. On tongue, scattered whitish patchy lesions on lateral and posterior aspect mainly. No erythema or other lesions appreciated. Post nasal drainage.    Eyes: Conjunctivae are normal.  Neck: No muscular tenderness present. No edema and no erythema present.  Cardiovascular: Normal rate and regular rhythm.  No murmur heard. Respiratory: Effort normal and breath sounds normal. No respiratory distress.  Musculoskeletal: She exhibits no edema.  Lymphadenopathy:       Head (right side): No submandibular adenopathy present.       Head (left side): No submandibular adenopathy present.    She has no cervical adenopathy.  Neurological: She is alert and oriented to person, place, and time. She has normal strength.  Skin: Skin is warm. No rash noted. No erythema.  Psychiatric: She has a normal mood and affect. Her speech is normal.  Well groomed, good eye contact.    ASSESSMENT AND PLAN:   Ms. Gina Fritz was seen today for cotton mouth and otalgia.  Diagnoses and all orders for this visit:  Earache symptoms in left ear  We discussed possible etiologies, E examination otherwise negative except for mild effusion. Instructed about warning signs. Follow-up as needed.  Oral thrush  This problem could explain a "cotton" like sensation. No other findings on exam that explain symptom. Levofloxacin side effects discussed, which includes glossitis and smell changes but she doe snot have symptoms that suggest either one of these conditions.  We discussed some side effects of inhaled  corticosteroids, strongly recommend swishing mouth after inh use. She will resume treatment with Nystatin suspension and will continue until lesions resolve +1 week. Follow-up as needed.  -       nystatin (MYCOSTATIN) 100000 UNIT/ML suspension; SWISH AND SWALLOW 5 MILLITERS FOUR TIMES A DAY FOR 10 DAYS  Eustachian tube dysfunction, left  Because history of hypertension, I am not recommending OTC decongestants. Auto inflation maneuvers might help. Instructed about warning signs. Follow-up as needed.  Nodule of middle lobe of right lung  She just completed abx treatment. COPD symptoms reported as stable.   Chest CT 03/08/17 1. Interval development of a 2.5 cm nodular opacity in the right middle lobe has a somewhat platelike configuration on sagittal imaging. This is probably infectious/inflammatory, but neoplasm is not excluded. 2. Other scattered smaller bilateral pulmonary nodules are similar although there is an enlarged right middle lobe nodule measuring 12 mm today compared to 9 mm previously. 3. Emphysema. 4. 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 New trace pericardial effusion.  She will continue following with Dr Halford Chessman, next appt 03/22/17.   -[name] was advised to seek immediate medical attention if sudden worsening symptoms or to follow if they persist or if new concerns arise.       Zarina Pe G. Martin[place], MD  [facility]. [place] office.

## 2017-03-16 ENCOUNTER — Encounter (HOSPITAL_COMMUNITY)
Admission: RE | Admit: 2017-03-16 | Discharge: 2017-03-16 | Disposition: A | Discharge status: Home / Self Care | Payer: Self-pay | Source: Ambulatory Visit | Attending: Pulmonary Disease | Admitting: Pulmonary Disease

## 2017-03-21 ENCOUNTER — Encounter (HOSPITAL_COMMUNITY)
Admission: RE | Admit: 2017-03-21 | Discharge: 2017-03-21 | Disposition: A | Discharge status: Home / Self Care | Payer: Self-pay | Source: Ambulatory Visit | Attending: Pulmonary Disease | Admitting: Pulmonary Disease

## 2017-03-22 ENCOUNTER — Ambulatory Visit: Payer: Medicare Other | Admitting: Pulmonary Disease

## 2017-03-22 ENCOUNTER — Ambulatory Visit (INDEPENDENT_AMBULATORY_CARE_PROVIDER_SITE_OTHER)
Admission: RE | Admit: 2017-03-22 | Discharge: 2017-03-22 | Disposition: A | Discharge status: Home / Self Care | Payer: Medicare Other | Source: Ambulatory Visit | Attending: Pulmonary Disease | Admitting: Pulmonary Disease

## 2017-03-22 ENCOUNTER — Encounter: Payer: Self-pay | Admitting: Family Medicine

## 2017-03-22 ENCOUNTER — Encounter: Payer: Self-pay | Admitting: Pulmonary Disease

## 2017-03-22 VITALS — BP 106/64 | HR 62 | Ht 62.0 in | Wt 122.4 lb

## 2017-03-22 DIAGNOSIS — J449 Chronic obstructive pulmonary disease, unspecified: Secondary | ICD-10-CM

## 2017-03-22 DIAGNOSIS — J432 Centrilobular emphysema: Secondary | ICD-10-CM | POA: Diagnosis not present

## 2017-03-22 DIAGNOSIS — R918 Other nonspecific abnormal finding of lung field: Secondary | ICD-10-CM | POA: Diagnosis not present

## 2017-03-22 DIAGNOSIS — Z8701 Personal history of pneumonia (recurrent): Secondary | ICD-10-CM

## 2017-03-22 MED ORDER — GLYCOPYRROLATE-FORMOTEROL 9-4.8 MCG/ACT IN AERO: 2.0000 | INHALATION_SPRAY | Freq: Two times a day (BID) | RESPIRATORY_TRACT | 0 refills | Status: AC

## 2017-03-22 NOTE — Progress Notes (Signed)
[facility]  Chief Complaint  Patient presents with  . Follow-up    f/u lung nodules, finished the ABX, started smoking again    Vital signs: BP 106/64 (BP Location: Left Arm, Cuff Size: Normal)   Pulse 62   Ht _0  (1.575 m)   Wt 122 lb 6.4 oz (55.5 kg)   SpO2 97%   BMI 22.39 kg/m   History of Present Illness: Gina Fritz is a 76 y.o. female smoker with COPD, emphysema and asthma.  She started smoking again.  Smokes a few cigarettes per day.  Had trouble with thrush.  Completing extended therapy from PCP.  Completed Abx.  Cough better.  Not having chest pain any more.  No fever.  Sputum clear.  No hemoptysis.  Not having wheeze.  Wants to know if she still needs inhaled steroid.  Physical Exam:  General - pleasant Eyes - pupils reactive ENT - no sinus tenderness, no oral exudate, no LAN Cardiac - regular, no murmur Chest - no wheeze, rales Abd - soft, non tender Ext - no edema Skin - no rashes Neuro - normal strength Psych - normal mood  Assessment/Plan:  COPD with asthma and emphysema. - once she finishes current dulera will transition to bevespi - sample of bevespi given and demonstrated how to use - she will call if she would like to continue bevespi - continue singulair and prn albuterol  Lung nodule and recent episode of pneumonia. - will get chest xray today and then determine when she needs f/u CT chest  Laryngopharyngeal reflux. - prilosec, zantac per PCP  Tobacco abuse. - she will try quitting on her own   Patient Instructions  Chest xray today  Try sample of bevespi two puffs in the morning, and two puffs at night  Stop dulera once you start bevespi  Call after bevespi sample complete if you want refill on this  Follow up in 6 months    Chesley Mires, MD [place] 03/22/2017, 11:31 AM Pager:  [phone]  Flow Sheet  Pulmonary tests: CT chest 02/05/15 >>  moderate emphysema PFT 05/22/15 >> FEV1 1.54 (78%), FEV1% 70, TLC 6.22 (130%), DLCO 46%, no BD CT chest 03/11/16 >> stable nodules CT chest 03/08/17 >> atherosclerosis, advanced centrilobular emphysema, apical scarring, new 2.5 cm opacity RML, 12 mm nodule minor fissure (was 9 mm), other nodules stable  Sleep tests:  Cardiac tests:  Events:  Review of Systems:  Past Medical History: She  has a past medical history of Allergic rhinitis, Anxiety, CAP (community acquired pneumonia), Colitis, COPD with emphysema ([place]), Depression, Dizziness, GERD (gastroesophageal reflux disease), History of chronic sinusitis, History of kidney stones, Hyperlipidemia, Hypertension, Irritable bowel syndrome, Loss of weight, Pulmonary nodules, Right ureteral stone, Sensorineural hearing loss, unilateral, and Subjective tinnitus.  Past Surgical History: She  has a past surgical history that includes Nasal sinus surgery; Tubal ligation; Cataract extraction (Left); Ectopic pregnancy surgery; REMOVAL CYST RIGHT ARM (age 11); and CYSTO/  LEFT URETEROSCOPIC STONE EXTRACTION/ STENT PLACEMENT (08-10-2009).  Family History: Her family history includes Congestive Heart Failure in her mother; Hearing loss in her mother; Hypertension in her father; Other in her unknown relative; Stroke in her mother.  Social History: She  reports that she quit smoking about 2 years ago. Her smoking use included cigarettes. She has a 25.00 pack-year smoking history. she has never used smokeless tobacco. She reports that she drinks about 0.6 oz of alcohol per week. She reports  that she does not use drugs.  Medications: Allergies as of 03/22/2017      Reactions   Penicillins Anaphylaxis   Has patient had a PCN reaction causing immediate rash, facial/tongue/throat swelling, SOB or lightheadedness with hypotension: Yes Has patient had a PCN reaction causing severe rash involving mucus membranes or skin necrosis: Yes Has patient had a PCN  reaction that required hospitalization Yes Has patient had a PCN reaction occurring within the last 10 years: No If all of the above answers are "NO", then may proceed with Cephalosporin use.   Sulfa Antibiotics Hives   Soy Allergy Itching, Rash      Medication List        Accurate as of 03/22/17 11:31 AM. Always use your most recent med list.          acetaminophen 325 MG tablet Commonly known as:  TYLENOL Take 2 tablets (650 mg total) by mouth every 6 (six) hours as needed for mild pain (or Fever >/= 101).   Adult Blood Pressure Cuff Lg Kit 1 applicator by Miscellaneous route daily.   albuterol 108 (90 Base) MCG/ACT inhaler Commonly known as:  PROAIR HFA Inhale 2 puffs into the lungs every 6 (six) hours as needed for wheezing or shortness of breath.   alendronate 70 MG tablet Commonly known as:  FOSAMAX Take 1 tablet (70 mg total) every Sunday by mouth.   amLODipine 5 MG tablet Commonly known as:  NORVASC Take 1 tablet (5 mg total) by mouth daily.   aspirin EC 81 MG tablet Take 81 mg by mouth daily.   atorvastatin 20 MG tablet Commonly known as:  LIPITOR TAKE 1 TABLET(20 MG) BY MOUTH DAILY   belladona alk-PHENObarbital 16.2 MG tablet Commonly known as:  DONNATAL Take 1 tablet by mouth daily as needed (ibs). No more than 2 tabs per month.   BESIVANCE 0.6 % Susp Generic drug:  Besifloxacin HCl Place 1 drop into the right eye See admin instructions. Takes the day before and the day of the dr visit 4 times a day   DULERA 100-5 MCG/ACT Aero Generic drug:  mometasone-formoterol INHALE 2 PUFFS INTO THE LUNGS TWICE DAILY   escitalopram 20 MG tablet Commonly known as:  LEXAPRO Take 1 tablet (20 mg total) by mouth daily.   fluticasone 50 MCG/ACT nasal spray Commonly known as:  FLONASE SHAKE LIQUID AND USE 2 SPRAYS IN EACH NOSTRIL DAILY   influenza vac recom quadrivalent 0.5 ML injection Commonly known as:  FLUBLOK Inject 0.5 mLs into the muscle once.     levofloxacin 500 MG tablet Commonly known as:  LEVAQUIN Take 1 tablet (500 mg total) by mouth daily.   losartan 50 MG tablet Commonly known as:  COZAAR TAKE 1 TABLET(50 MG) BY MOUTH TWICE DAILY   montelukast 10 MG tablet Commonly known as:  SINGULAIR TAKE 1 TABLET(10 MG) BY MOUTH DAILY   multivitamin with minerals Tabs tablet Take 1 tablet by mouth daily.   nystatin 100000 UNIT/ML suspension Commonly known as:  MYCOSTATIN SWISH AND SWALLOW 5 MILLITERS FOUR TIMES A DAY FOR 10 DAYS   omeprazole 20 MG capsule Commonly known as:  PRILOSEC TAKE ONE CAPSULE BY MOUTH EVERY DAY   polyethylene glycol packet Commonly known as:  MIRALAX / GLYCOLAX Take 17 g by mouth daily as needed for mild constipation.   ranitidine 300 MG tablet Commonly known as:  ZANTAC Take 1 tablet (300 mg total) at bedtime by mouth.   VESICARE 10 MG tablet Generic drug:  solifenacin TAKE 1 TABLET(10 MG) BY MOUTH DAILY   VITAMIN B-12 PO Take 1 tablet by mouth daily.   Vitamin D 2000 units Caps Take 2,000 Units by mouth daily.   Zoster Vaccine Adjuvanted injection Commonly known as:  SHINGRIX 0.5 ml in muscle and repeat in 8 weeks

## 2017-03-22 NOTE — Patient Instructions (Signed)
Chest xray today  Try sample of bevespi two puffs in the morning, and two puffs at night  Stop dulera once you start bevespi  Call after bevespi sample complete if you want refill on this  Follow up in 6 months

## 2017-03-22 NOTE — Addendum Note (Signed)
Addended by: Jannette Spanner on: 03/22/2017 12:16 PM   Modules accepted: Orders

## 2017-03-23 ENCOUNTER — Telehealth: Payer: Self-pay | Admitting: Pulmonary Disease

## 2017-03-23 ENCOUNTER — Encounter (HOSPITAL_COMMUNITY)
Admission: RE | Admit: 2017-03-23 | Discharge: 2017-03-23 | Disposition: A | Discharge status: Home / Self Care | Payer: Self-pay | Source: Ambulatory Visit | Attending: Pulmonary Disease | Admitting: Pulmonary Disease

## 2017-03-23 NOTE — Telephone Encounter (Signed)
Dg Chest 2 View  Result Date: 03/22/2017 CLINICAL DATA:  Pneumonia, cough, shortness of breath. EXAM: CHEST  2 VIEW COMPARISON:  Radiographs of June 16, 2015. FINDINGS: The heart size and mediastinal contours are within normal limits. Both lungs are clear. No pneumothorax or pleural effusion is noted. Hyperexpansion of the lungs is noted. The visualized skeletal structures are unremarkable. IMPRESSION: No active cardiopulmonary disease. Hyperexpansion of the lungs consistent with chronic obstructive pulmonary disease. Electronically Signed   By: Marijo Conception, M.D.   On: 03/22/2017 16:15     Please let her know that her chest xray showed changes of COPD, and no other worrisome findings.  No change to current therapy needed.

## 2017-03-29 ENCOUNTER — Encounter: Payer: Self-pay | Admitting: Pulmonary Disease

## 2017-03-29 ENCOUNTER — Telehealth: Payer: Self-pay | Admitting: Pulmonary Disease

## 2017-03-29 MED ORDER — GLYCOPYRROLATE-FORMOTEROL 9-4.8 MCG/ACT IN AERO: 2.0000 | INHALATION_SPRAY | Freq: Two times a day (BID) | RESPIRATORY_TRACT | 6 refills | Status: DC

## 2017-03-29 NOTE — Telephone Encounter (Signed)
Left voice mail on machine for patient to return phone call back regarding results. X1  

## 2017-03-29 NOTE — Telephone Encounter (Signed)
Patient returning [facility] phone call, CB is [phone].

## 2017-03-29 NOTE — Telephone Encounter (Signed)
Received a MyChart message for the same reason. Will close this encounter.

## 2017-03-30 ENCOUNTER — Encounter (HOSPITAL_COMMUNITY)
Admission: RE | Admit: 2017-03-30 | Discharge: 2017-03-30 | Disposition: A | Discharge status: Home / Self Care | Payer: Self-pay | Source: Ambulatory Visit | Attending: Pulmonary Disease | Admitting: Pulmonary Disease

## 2017-04-06 ENCOUNTER — Encounter (HOSPITAL_COMMUNITY)
Admission: RE | Admit: 2017-04-06 | Discharge: 2017-04-06 | Disposition: A | Discharge status: Home / Self Care | Payer: Self-pay | Source: Ambulatory Visit | Attending: Pulmonary Disease | Admitting: Pulmonary Disease

## 2017-04-06 ENCOUNTER — Telehealth: Payer: Self-pay | Admitting: Pulmonary Disease

## 2017-04-06 ENCOUNTER — Encounter (INDEPENDENT_AMBULATORY_CARE_PROVIDER_SITE_OTHER): Payer: Medicare Other | Admitting: Ophthalmology

## 2017-04-06 DIAGNOSIS — J449 Chronic obstructive pulmonary disease, unspecified: Secondary | ICD-10-CM | POA: Insufficient documentation

## 2017-04-06 NOTE — Telephone Encounter (Signed)
ATC pt, no answer. Left message for pt to call back.  

## 2017-04-06 NOTE — Telephone Encounter (Signed)
Left voice mail on machine for patient to return phone call back regarding results. X2 

## 2017-04-07 NOTE — Telephone Encounter (Signed)
Spoke with pt, she wanted her x ray results. VS please advise.

## 2017-04-09 NOTE — Telephone Encounter (Signed)
Please refer to phone note from 03/23/17.    Please let her know that her chest xray showed changes of COPD, and no other worrisome findings.  No change to current therapy needed.

## 2017-04-10 NOTE — Telephone Encounter (Signed)
Spoke with pt, aware of results/recs.  Nothing further needed.  

## 2017-04-11 ENCOUNTER — Encounter: Payer: Self-pay | Admitting: Pulmonary Disease

## 2017-04-11 ENCOUNTER — Encounter (HOSPITAL_COMMUNITY)
Admission: RE | Admit: 2017-04-11 | Discharge: 2017-04-11 | Disposition: A | Discharge status: Home / Self Care | Payer: Self-pay | Source: Ambulatory Visit | Attending: Pulmonary Disease | Admitting: Pulmonary Disease

## 2017-04-11 NOTE — Telephone Encounter (Signed)
Called and spoke with patient today regarding results and recommendations. Pt requesting follow up appt, scheduled for next wed 04-19-2017 Informed the patient of results today.The patient verbalized understanding and denied any questions or concerns at this time. Nothing further needed.

## 2017-04-17 ENCOUNTER — Telehealth: Payer: Self-pay | Admitting: Pulmonary Disease

## 2017-04-17 NOTE — Telephone Encounter (Signed)
Surgical clearance can be done at this upcoming appointment. Nothing further is needed.

## 2017-04-18 ENCOUNTER — Encounter (HOSPITAL_COMMUNITY)
Admission: RE | Admit: 2017-04-18 | Discharge: 2017-04-18 | Disposition: A | Discharge status: Home / Self Care | Payer: Self-pay | Source: Ambulatory Visit | Attending: Pulmonary Disease | Admitting: Pulmonary Disease

## 2017-04-19 ENCOUNTER — Other Ambulatory Visit: Payer: Self-pay | Admitting: Urology

## 2017-04-19 ENCOUNTER — Ambulatory Visit: Payer: Medicare Other | Admitting: Pulmonary Disease

## 2017-04-19 ENCOUNTER — Encounter: Payer: Self-pay | Admitting: Pulmonary Disease

## 2017-04-19 ENCOUNTER — Telehealth: Payer: Self-pay | Admitting: Family Medicine

## 2017-04-19 VITALS — BP 108/78 | HR 71 | Ht 62.0 in | Wt 122.8 lb

## 2017-04-19 DIAGNOSIS — Z01811 Encounter for preprocedural respiratory examination: Secondary | ICD-10-CM

## 2017-04-19 DIAGNOSIS — J432 Centrilobular emphysema: Secondary | ICD-10-CM

## 2017-04-19 DIAGNOSIS — R911 Solitary pulmonary nodule: Secondary | ICD-10-CM

## 2017-04-19 DIAGNOSIS — K219 Gastro-esophageal reflux disease without esophagitis: Secondary | ICD-10-CM

## 2017-04-19 DIAGNOSIS — J449 Chronic obstructive pulmonary disease, unspecified: Secondary | ICD-10-CM | POA: Diagnosis not present

## 2017-04-19 NOTE — Telephone Encounter (Signed)
Pt requesting a change in her prescription for thrush.

## 2017-04-19 NOTE — Telephone Encounter (Signed)
Copied from [place] [id number]. Topic: Quick Communication - See Telephone Encounter >> Apr 19, 2017 12:07 PM Bea Graff, NT wrote: CRM for notification. See Telephone encounter for: Pt would like to get her prescription changed for the thrush in her mouth to a pill instead of the liquid. [facility] does not know when they will have the liquid back in stock. Pt would like a call from a nurse to discuss this with/  04/19/17.

## 2017-04-19 NOTE — Progress Notes (Signed)
[facility]  Chief Complaint  Patient presents with  . Follow-up    results for CT,surgical clearance for Dr Harrick-urology    Vital signs: BP 108/78 (BP Location: Right Arm, Cuff Size: Normal)   Pulse 71   Ht 5' 2"  (1.575 m)   Wt 122 lb 12.8 oz (55.7 kg)   SpO2 92%   BMI 22.46 kg/m   History of Present Illness: Gina Fritz is a 77 y.o. female smoker with COPD, emphysema and asthma.  She is being set up for ureteroscopy with Dr. Dayna Ramus.  She has been experiencing hematuria.  She still smokes about 5 cigarettes per day.  She has cough with clear sputum.  No wheeze, fever, chest pain, hemoptysis, skin rash, or swelling.  She feels that bevespi is working well.  She is still working with pulmonary rehab.   Physical Exam:  General - pleasant Eyes - pupils reactive ENT - no sinus tenderness, no oral exudate, no LAN Cardiac - regular, no murmur Chest - no wheeze, rales Abd - soft, non tender Ext - no edema Skin - no rashes Neuro - normal strength Psych - normal mood  Assessment/Plan:  COPD with asthma and emphysema. - continue bevespi, singulair, and prn albuterol  Lung nodule and recent episode of pneumonia. - will arrange for f/u CT chest without contrast  Laryngopharyngeal reflux. - prilosec, zantac per PCP  Tobacco abuse. - she will try quitting on her own  Preoperative respiratory assessment. - she can proceed with the procedure - she will need to have close monitoring of her oxygenation during  - pulmonary service can be available as needed after the procedure   Patient Instructions  Will arrange for CT chest and call with results  Follow up in 6 months    Chesley Mires, MD [place] 04/19/2017, 9:27 AM Pager:  [phone]  Flow Sheet  Pulmonary tests: CT chest 02/05/15 >> moderate emphysema PFT 05/22/15 >> FEV1 1.54 (78%), FEV1% 70, TLC 6.22 (130%), DLCO 46%, no BD CT  chest 03/11/16 >> stable nodules CT chest 03/08/17 >> atherosclerosis, advanced centrilobular emphysema, apical scarring, new 2.5 cm opacity RML, 12 mm nodule minor fissure (was 9 mm), other nodules stable  Past Medical History: She  has a past medical history of Allergic rhinitis, Anxiety, CAP (community acquired pneumonia), Colitis, COPD with emphysema ([place]), Depression, Dizziness, GERD (gastroesophageal reflux disease), History of chronic sinusitis, History of kidney stones, Hyperlipidemia, Hypertension, Irritable bowel syndrome, Loss of weight, Pulmonary nodules, Right ureteral stone, Sensorineural hearing loss, unilateral, and Subjective tinnitus.  Past Surgical History: She  has a past surgical history that includes Nasal sinus surgery; Tubal ligation; Cataract extraction (Left); Ectopic pregnancy surgery; REMOVAL CYST RIGHT ARM (age 61); and CYSTO/  LEFT URETEROSCOPIC STONE EXTRACTION/ STENT PLACEMENT (08-10-2009).  Family History: Her family history includes Congestive Heart Failure in her mother; Hearing loss in her mother; Hypertension in her father; Other in her unknown relative; Stroke in her mother.  Social History: She  reports that she has been smoking cigarettes.  She has a 12.50 pack-year smoking history. she has never used smokeless tobacco. She reports that she drinks about 0.6 oz of alcohol per week. She reports that she does not use drugs.  Medications: Allergies as of 04/19/2017      Reactions   Penicillins Anaphylaxis   Has patient had a PCN reaction causing immediate rash, facial/tongue/throat swelling, SOB or lightheadedness with hypotension: Yes Has patient had a PCN reaction  causing severe rash involving mucus membranes or skin necrosis: Yes Has patient had a PCN reaction that required hospitalization Yes Has patient had a PCN reaction occurring within the last 10 years: No If all of the above answers are "NO", then may proceed with Cephalosporin use.   Sulfa  Antibiotics Hives   Soy Allergy Itching, Rash      Medication List        Accurate as of 04/19/17  9:27 AM. Always use your most recent med list.          acetaminophen 325 MG tablet Commonly known as:  TYLENOL Take 2 tablets (650 mg total) by mouth every 6 (six) hours as needed for mild pain (or Fever >/= 101).   Adult Blood Pressure Cuff Lg Kit 1 applicator by Miscellaneous route daily.   albuterol 108 (90 Base) MCG/ACT inhaler Commonly known as:  PROAIR HFA Inhale 2 puffs into the lungs every 6 (six) hours as needed for wheezing or shortness of breath.   alendronate 70 MG tablet Commonly known as:  FOSAMAX Take 1 tablet (70 mg total) every Sunday by mouth.   amLODipine 5 MG tablet Commonly known as:  NORVASC Take 1 tablet (5 mg total) by mouth daily.   aspirin EC 81 MG tablet Take 81 mg by mouth daily.   atorvastatin 20 MG tablet Commonly known as:  LIPITOR TAKE 1 TABLET(20 MG) BY MOUTH DAILY   [place] alk-PHENObarbital 16.2 MG tablet Commonly known as:  DONNATAL Take 1 tablet by mouth daily as needed (ibs). No more than 2 tabs per month.   BESIVANCE 0.6 % Susp Generic drug:  Besifloxacin HCl Place 1 drop into the right eye See admin instructions. Takes the day before and the day of the dr visit 4 times a day   DULERA 100-5 MCG/ACT Aero Generic drug:  mometasone-formoterol INHALE 2 PUFFS INTO THE LUNGS TWICE DAILY   escitalopram 20 MG tablet Commonly known as:  LEXAPRO Take 1 tablet (20 mg total) by mouth daily.   fluticasone 50 MCG/ACT nasal spray Commonly known as:  FLONASE SHAKE LIQUID AND USE 2 SPRAYS IN EACH NOSTRIL DAILY   Glycopyrrolate-Formoterol 9-4.8 MCG/ACT Aero Commonly known as:  BEVESPI AEROSPHERE Inhale 2 puffs into the lungs 2 (two) times daily.   Glycopyrrolate-Formoterol 9-4.8 MCG/ACT Aero Commonly known as:  BEVESPI AEROSPHERE Inhale 2 puffs into the lungs 2 (two) times daily.   influenza vac recom quadrivalent 0.5 ML  injection Commonly known as:  FLUBLOK Inject 0.5 mLs into the muscle once.   losartan 50 MG tablet Commonly known as:  COZAAR TAKE 1 TABLET(50 MG) BY MOUTH TWICE DAILY   montelukast 10 MG tablet Commonly known as:  SINGULAIR TAKE 1 TABLET(10 MG) BY MOUTH DAILY   multivitamin with minerals Tabs tablet Take 1 tablet by mouth daily.   nystatin 100000 UNIT/ML suspension Commonly known as:  MYCOSTATIN SWISH AND SWALLOW 5 MILLITERS FOUR TIMES A DAY FOR 10 DAYS   omeprazole 20 MG capsule Commonly known as:  PRILOSEC TAKE ONE CAPSULE BY MOUTH EVERY DAY   polyethylene glycol packet Commonly known as:  MIRALAX / GLYCOLAX Take 17 g by mouth daily as needed for mild constipation.   ranitidine 300 MG tablet Commonly known as:  ZANTAC Take 1 tablet (300 mg total) at bedtime by mouth.   VESICARE 10 MG tablet Generic drug:  solifenacin TAKE 1 TABLET(10 MG) BY MOUTH DAILY   VITAMIN B-12 PO Take 1 tablet by mouth daily.   Vitamin  D 2000 units Caps Take 2,000 Units by mouth daily.   Zoster Vaccine Adjuvanted injection Commonly known as:  SHINGRIX 0.5 ml in muscle and repeat in 8 weeks

## 2017-04-19 NOTE — Patient Instructions (Signed)
Will arrange for CT chest and call with results  Follow up in 6 months

## 2017-04-19 NOTE — Telephone Encounter (Signed)
Message sent to Dr. Jordan for review. 

## 2017-04-20 ENCOUNTER — Encounter (HOSPITAL_COMMUNITY)
Admission: RE | Admit: 2017-04-20 | Discharge: 2017-04-20 | Disposition: A | Discharge status: Home / Self Care | Payer: Self-pay | Source: Ambulatory Visit | Attending: Pulmonary Disease | Admitting: Pulmonary Disease

## 2017-04-21 ENCOUNTER — Other Ambulatory Visit: Payer: Self-pay | Admitting: *Deleted

## 2017-04-21 ENCOUNTER — Ambulatory Visit (INDEPENDENT_AMBULATORY_CARE_PROVIDER_SITE_OTHER)
Admission: RE | Admit: 2017-04-21 | Discharge: 2017-04-21 | Disposition: A | Discharge status: Home / Self Care | Payer: Medicare Other | Source: Ambulatory Visit | Attending: Pulmonary Disease | Admitting: Pulmonary Disease

## 2017-04-21 DIAGNOSIS — B370 Candidal stomatitis: Secondary | ICD-10-CM

## 2017-04-21 DIAGNOSIS — R911 Solitary pulmonary nodule: Secondary | ICD-10-CM

## 2017-04-21 DIAGNOSIS — I700 Atherosclerosis of aorta: Secondary | ICD-10-CM

## 2017-04-21 HISTORY — DX: Atherosclerosis of aorta: I70.0

## 2017-04-21 MED ORDER — NYSTATIN 100000 UNIT/ML MT SUSP: OROMUCOSAL | 1 refills | Status: DC

## 2017-04-21 NOTE — Telephone Encounter (Signed)
Oral thrush is usually managed with oral Nystatin. I will not recommend oral antifungal medication because this is chronic and most likely related to inhaler she uses for COPD.  Thanks, BJ

## 2017-04-21 NOTE — Telephone Encounter (Signed)
Spoke with patient, patient verbalized understanding. Rx for liquid medication sent to [facility].

## 2017-04-24 ENCOUNTER — Encounter (HOSPITAL_COMMUNITY): Payer: Self-pay

## 2017-04-24 NOTE — Pre-Procedure Instructions (Signed)
The following are in epic: Last office visit note and surgical clearance Dr. Halford Chessman 04/19/17 CT chest 04/21/17 CXR 03/22/17 EKG 06/20/16

## 2017-04-24 NOTE — Patient Instructions (Addendum)
Your procedure is scheduled on: Friday, Jan. 25, 2019   Surgery Time:  10:00AM-11:00AM   Report to [facility]  Entrance    Report to admitting at 8:00 AM   Call this number if you have problems the morning of surgery [phone]   Do not eat food or drink liquids :After Midnight.   Do NOT smoke after Midnight   Take these medicines the morning of surgery with A SIP OF WATER: Omeprazole, Escitalopram,   Use inhaler, eye drops, and nasal sprays per normal routine   Bring rescue asthma inhaler day of surgery                               You may not have any metal on your body including hair pins, jewelry, and body piercings             Do not wear make-up, lotions, powders, perfumes/cologne, or deodorant             Do not wear nail polish.  Do not shave  48 hours prior to surgery.    Do not bring valuables to the hospital. [place].   Contacts, dentures or bridgework may not be worn into surgery.   Patients discharged the day of surgery will not be allowed to drive home.   Name and phone number of your driver:   Special Instructions: Bring a copy of your healthcare power of attorney and living will documents         the day of surgery if you haven't scanned them in before.              Please read over the following fact sheets you were given:   [facility] - Preparing for Surgery Before surgery, you can play an important role.  Because skin is not sterile, your skin needs to be as free of germs as possible.  You can reduce the number of germs on your skin by washing with CHG (chlorahexidine gluconate) soap before surgery.  CHG is an antiseptic cleaner which kills germs and bonds with the skin to continue killing germs even after washing. Please DO NOT use if you have an allergy to CHG or antibacterial soaps.  If your skin becomes reddened/irritated stop using the CHG and inform your nurse when you arrive  at Short Stay. Do not shave (including legs and underarms) for at least 48 hours prior to the first CHG shower.  You may shave your face/neck.  Please follow these instructions carefully:  1.  Shower with CHG Soap the night before surgery and the  morning of surgery.  2.  If you choose to wash your hair, wash your hair first as usual with your normal  shampoo.  3.  After you shampoo, rinse your hair and body thoroughly to remove the shampoo.                             4.  Use CHG as you would any other liquid soap.  You can apply chg directly to the skin and wash.  Gently with a scrungie or clean washcloth.  5.  Apply the CHG Soap to your body ONLY FROM THE NECK DOWN.   Do   not use on face/  open                           Wound or open sores. Avoid contact with eyes, ears mouth and   genitals (private parts).                       Wash face,  Genitals (private parts) with your normal soap.             6.  Wash thoroughly, paying special attention to the area where your    surgery  will be performed.  7.  Thoroughly rinse your body with warm water from the neck down.  8.  DO NOT shower/wash with your normal soap after using and rinsing off the CHG Soap.                9.  Pat yourself dry with a clean towel.            10.  Wear clean pajamas.            11.  Place clean sheets on your bed the night of your first shower and do not  sleep with pets. Day of Surgery : Do not apply any lotions/deodorants the morning of surgery.  Please wear clean clothes to the hospital/surgery center.  FAILURE TO FOLLOW THESE INSTRUCTIONS MAY RESULT IN THE CANCELLATION OF YOUR SURGERY  PATIENT SIGNATURE_________________________________  NURSE SIGNATURE__________________________________  ________________________________________________________________________

## 2017-04-25 ENCOUNTER — Encounter (HOSPITAL_COMMUNITY)
Admission: RE | Admit: 2017-04-25 | Discharge: 2017-04-25 | Disposition: A | Discharge status: Home / Self Care | Payer: Self-pay | Source: Ambulatory Visit | Attending: Pulmonary Disease | Admitting: Pulmonary Disease

## 2017-04-25 ENCOUNTER — Telehealth: Payer: Self-pay | Admitting: Pulmonary Disease

## 2017-04-25 NOTE — Telephone Encounter (Signed)
CT chest 04/21/17 >> decreased size of RML nodule, other nodules stable since 03/2016   Discussed results with pt's husband.

## 2017-04-26 ENCOUNTER — Encounter (HOSPITAL_COMMUNITY): Payer: Self-pay

## 2017-04-26 ENCOUNTER — Encounter (HOSPITAL_COMMUNITY): Payer: Self-pay | Admitting: *Deleted

## 2017-04-26 ENCOUNTER — Encounter (HOSPITAL_COMMUNITY)
Admission: RE | Admit: 2017-04-26 | Discharge: 2017-04-26 | Disposition: A | Discharge status: Home / Self Care | Payer: Medicare Other | Source: Ambulatory Visit | Attending: Urology | Admitting: Urology

## 2017-04-26 ENCOUNTER — Other Ambulatory Visit: Payer: Self-pay

## 2017-04-26 DIAGNOSIS — Z880 Allergy status to penicillin: Secondary | ICD-10-CM | POA: Diagnosis not present

## 2017-04-26 DIAGNOSIS — R32 Unspecified urinary incontinence: Secondary | ICD-10-CM | POA: Diagnosis not present

## 2017-04-26 DIAGNOSIS — M1990 Unspecified osteoarthritis, unspecified site: Secondary | ICD-10-CM | POA: Diagnosis not present

## 2017-04-26 DIAGNOSIS — Z9849 Cataract extraction status, unspecified eye: Secondary | ICD-10-CM | POA: Diagnosis not present

## 2017-04-26 DIAGNOSIS — Z882 Allergy status to sulfonamides status: Secondary | ICD-10-CM | POA: Diagnosis not present

## 2017-04-26 DIAGNOSIS — Z818 Family history of other mental and behavioral disorders: Secondary | ICD-10-CM | POA: Diagnosis not present

## 2017-04-26 DIAGNOSIS — I10 Essential (primary) hypertension: Secondary | ICD-10-CM | POA: Diagnosis not present

## 2017-04-26 DIAGNOSIS — Z823 Family history of stroke: Secondary | ICD-10-CM | POA: Diagnosis not present

## 2017-04-26 DIAGNOSIS — Z8249 Family history of ischemic heart disease and other diseases of the circulatory system: Secondary | ICD-10-CM | POA: Diagnosis not present

## 2017-04-26 DIAGNOSIS — Z79899 Other long term (current) drug therapy: Secondary | ICD-10-CM | POA: Diagnosis not present

## 2017-04-26 DIAGNOSIS — Z87442 Personal history of urinary calculi: Secondary | ICD-10-CM | POA: Diagnosis not present

## 2017-04-26 DIAGNOSIS — F17200 Nicotine dependence, unspecified, uncomplicated: Secondary | ICD-10-CM | POA: Diagnosis not present

## 2017-04-26 DIAGNOSIS — M549 Dorsalgia, unspecified: Secondary | ICD-10-CM | POA: Diagnosis not present

## 2017-04-26 DIAGNOSIS — E7800 Pure hypercholesterolemia, unspecified: Secondary | ICD-10-CM | POA: Diagnosis not present

## 2017-04-26 DIAGNOSIS — J449 Chronic obstructive pulmonary disease, unspecified: Secondary | ICD-10-CM | POA: Diagnosis not present

## 2017-04-26 DIAGNOSIS — R310 Gross hematuria: Secondary | ICD-10-CM | POA: Diagnosis not present

## 2017-04-26 DIAGNOSIS — K219 Gastro-esophageal reflux disease without esophagitis: Secondary | ICD-10-CM | POA: Diagnosis not present

## 2017-04-26 DIAGNOSIS — N1330 Unspecified hydronephrosis: Secondary | ICD-10-CM | POA: Diagnosis not present

## 2017-04-26 DIAGNOSIS — N2889 Other specified disorders of kidney and ureter: Secondary | ICD-10-CM | POA: Diagnosis not present

## 2017-04-26 HISTORY — DX: Unspecified asthma, uncomplicated: J45.909

## 2017-04-26 HISTORY — DX: Candidal stomatitis: B37.0

## 2017-04-26 HISTORY — DX: Atherosclerosis of aorta: I70.0

## 2017-04-26 HISTORY — DX: Unspecified osteoarthritis, unspecified site: M19.90

## 2017-04-26 HISTORY — DX: Headache, unspecified: R51.9

## 2017-04-26 HISTORY — DX: Headache: R51

## 2017-04-26 HISTORY — DX: Dorsalgia, unspecified: M54.9

## 2017-04-26 HISTORY — DX: Unspecified macular degeneration: H35.30

## 2017-04-26 LAB — CBC
HEMATOCRIT: 39.9 % (ref 36.0–46.0)
Hemoglobin: 12.8 g/dL (ref 12.0–15.0)
MCH: 26.8 pg (ref 26.0–34.0)
MCHC: 32.1 g/dL (ref 30.0–36.0)
MCV: 83.5 fL (ref 78.0–100.0)
PLATELETS: 244 10*3/uL (ref 150–400)
RBC: 4.78 MIL/uL (ref 3.87–5.11)
RDW: 14.9 % (ref 11.5–15.5)
WBC: 5.3 10*3/uL (ref 4.0–10.5)

## 2017-04-26 LAB — BASIC METABOLIC PANEL
Anion gap: 9 (ref 5–15)
BUN: 13 mg/dL (ref 6–20)
CHLORIDE: 104 mmol/L (ref 101–111)
CO2: 27 mmol/L (ref 22–32)
CREATININE: 0.78 mg/dL (ref 0.44–1.00)
Calcium: 10 mg/dL (ref 8.9–10.3)
GFR calc Af Amer: >60 mL/min (ref >60)
GFR calc non Af Amer: >60 mL/min (ref >60)
GLUCOSE: 94 mg/dL (ref 65–99)
Potassium: 4 mmol/L (ref 3.5–5.1)
Sodium: 140 mmol/L (ref 135–145)

## 2017-04-26 NOTE — Progress Notes (Signed)
Gina Fritz is dropping from the Pulmonary Maintenance program due to her up coming surgery 04/28/17. She states that she plans to rejoin as soon as she recovered. Gina Fritz plans to contact us when she is ready to return.

## 2017-04-27 ENCOUNTER — Encounter (HOSPITAL_COMMUNITY)
Admission: RE | Admit: 2017-04-27 | Discharge: 2017-04-27 | Disposition: A | Discharge status: Home / Self Care | Payer: Self-pay | Source: Ambulatory Visit | Attending: Pulmonary Disease | Admitting: Pulmonary Disease

## 2017-04-28 ENCOUNTER — Ambulatory Visit (HOSPITAL_COMMUNITY): Payer: Medicare Other | Admitting: Certified Registered Nurse Anesthetist

## 2017-04-28 ENCOUNTER — Encounter (HOSPITAL_COMMUNITY)
Admission: RE | Disposition: A | Discharge status: Home / Self Care | Payer: Self-pay | Source: Ambulatory Visit | Attending: Urology

## 2017-04-28 ENCOUNTER — Encounter (HOSPITAL_COMMUNITY): Payer: Self-pay | Admitting: Certified Registered Nurse Anesthetist

## 2017-04-28 ENCOUNTER — Ambulatory Visit (HOSPITAL_COMMUNITY)
Admission: RE | Admit: 2017-04-28 | Discharge: 2017-04-28 | Disposition: A | Discharge status: Home / Self Care | Payer: Medicare Other | Source: Ambulatory Visit | Attending: Urology | Admitting: Urology

## 2017-04-28 ENCOUNTER — Ambulatory Visit (HOSPITAL_COMMUNITY): Payer: Medicare Other

## 2017-04-28 DIAGNOSIS — R310 Gross hematuria: Secondary | ICD-10-CM | POA: Insufficient documentation

## 2017-04-28 DIAGNOSIS — N1330 Unspecified hydronephrosis: Secondary | ICD-10-CM | POA: Insufficient documentation

## 2017-04-28 DIAGNOSIS — Z8249 Family history of ischemic heart disease and other diseases of the circulatory system: Secondary | ICD-10-CM | POA: Insufficient documentation

## 2017-04-28 DIAGNOSIS — Z87442 Personal history of urinary calculi: Secondary | ICD-10-CM | POA: Insufficient documentation

## 2017-04-28 DIAGNOSIS — F17200 Nicotine dependence, unspecified, uncomplicated: Secondary | ICD-10-CM | POA: Insufficient documentation

## 2017-04-28 DIAGNOSIS — N2889 Other specified disorders of kidney and ureter: Secondary | ICD-10-CM | POA: Diagnosis not present

## 2017-04-28 DIAGNOSIS — Z882 Allergy status to sulfonamides status: Secondary | ICD-10-CM | POA: Insufficient documentation

## 2017-04-28 DIAGNOSIS — M1990 Unspecified osteoarthritis, unspecified site: Secondary | ICD-10-CM | POA: Insufficient documentation

## 2017-04-28 DIAGNOSIS — R32 Unspecified urinary incontinence: Secondary | ICD-10-CM | POA: Insufficient documentation

## 2017-04-28 DIAGNOSIS — Z818 Family history of other mental and behavioral disorders: Secondary | ICD-10-CM | POA: Insufficient documentation

## 2017-04-28 DIAGNOSIS — K219 Gastro-esophageal reflux disease without esophagitis: Secondary | ICD-10-CM | POA: Insufficient documentation

## 2017-04-28 DIAGNOSIS — J449 Chronic obstructive pulmonary disease, unspecified: Secondary | ICD-10-CM | POA: Insufficient documentation

## 2017-04-28 DIAGNOSIS — Z79899 Other long term (current) drug therapy: Secondary | ICD-10-CM | POA: Insufficient documentation

## 2017-04-28 DIAGNOSIS — Z880 Allergy status to penicillin: Secondary | ICD-10-CM | POA: Insufficient documentation

## 2017-04-28 DIAGNOSIS — Z823 Family history of stroke: Secondary | ICD-10-CM | POA: Insufficient documentation

## 2017-04-28 DIAGNOSIS — M549 Dorsalgia, unspecified: Secondary | ICD-10-CM | POA: Insufficient documentation

## 2017-04-28 DIAGNOSIS — I10 Essential (primary) hypertension: Secondary | ICD-10-CM | POA: Insufficient documentation

## 2017-04-28 DIAGNOSIS — E7800 Pure hypercholesterolemia, unspecified: Secondary | ICD-10-CM | POA: Insufficient documentation

## 2017-04-28 DIAGNOSIS — Z9849 Cataract extraction status, unspecified eye: Secondary | ICD-10-CM | POA: Insufficient documentation

## 2017-04-28 HISTORY — PX: CYSTOSCOPY WITH RETROGRADE PYELOGRAM, URETEROSCOPY AND STENT PLACEMENT: SHX5789

## 2017-04-28 SURGERY — CYSTOURETEROSCOPY, WITH RETROGRADE PYELOGRAM AND STENT INSERTION
Anesthesia: General | Laterality: Right

## 2017-04-28 MED ORDER — PROMETHAZINE HCL 25 MG/ML IJ SOLN: 6.2500 mg | INTRAMUSCULAR | Status: DC | PRN

## 2017-04-28 MED ORDER — ONDANSETRON HCL 4 MG/2ML IJ SOLN
INTRAMUSCULAR | Status: AC
  Filled 2017-04-28: qty 2

## 2017-04-28 MED ORDER — PROPOFOL 10 MG/ML IV BOLUS
INTRAVENOUS | Status: DC | PRN
  Administered 2017-04-28: 100 mg via INTRAVENOUS

## 2017-04-28 MED ORDER — DEXAMETHASONE SODIUM PHOSPHATE 4 MG/ML IJ SOLN
INTRAMUSCULAR | Status: DC | PRN
  Administered 2017-04-28: 4 mg via INTRAVENOUS

## 2017-04-28 MED ORDER — EPHEDRINE SULFATE-NACL 50-0.9 MG/10ML-% IV SOSY
PREFILLED_SYRINGE | INTRAVENOUS | Status: DC | PRN
  Administered 2017-04-28 (×2): 5 mg via INTRAVENOUS

## 2017-04-28 MED ORDER — MEPERIDINE HCL 50 MG/ML IJ SOLN: 6.2500 mg | INTRAMUSCULAR | Status: DC | PRN

## 2017-04-28 MED ORDER — DEXAMETHASONE SODIUM PHOSPHATE 10 MG/ML IJ SOLN
INTRAMUSCULAR | Status: AC
  Filled 2017-04-28: qty 1

## 2017-04-28 MED ORDER — PHENAZOPYRIDINE HCL 200 MG PO TABS: 200.0000 mg | ORAL_TABLET | Freq: Three times a day (TID) | ORAL | 0 refills | Status: AC | PRN

## 2017-04-28 MED ORDER — ACETAMINOPHEN 10 MG/ML IV SOLN: 1000.0000 mg | Freq: Once | INTRAVENOUS | Status: DC | PRN

## 2017-04-28 MED ORDER — TRAMADOL HCL 50 MG PO TABS: 50.0000 mg | ORAL_TABLET | Freq: Four times a day (QID) | ORAL | 0 refills | Status: DC | PRN

## 2017-04-28 MED ORDER — ONDANSETRON HCL 4 MG/2ML IJ SOLN
INTRAMUSCULAR | Status: DC | PRN
Start: 2017-04-28 — End: 2017-04-28
  Administered 2017-04-28: 4 mg via INTRAVENOUS

## 2017-04-28 MED ORDER — FENTANYL CITRATE (PF) 100 MCG/2ML IJ SOLN
INTRAMUSCULAR | Status: DC | PRN
  Administered 2017-04-28: 25 ug via INTRAVENOUS
  Administered 2017-04-28 (×2): 50 ug via INTRAVENOUS

## 2017-04-28 MED ORDER — HYDROMORPHONE HCL 1 MG/ML IJ SOLN: 0.2500 mg | INTRAMUSCULAR | Status: DC | PRN

## 2017-04-28 MED ORDER — CLINDAMYCIN PHOSPHATE 900 MG/50ML IV SOLN
900.0000 mg | INTRAVENOUS | Status: AC
  Administered 2017-04-28: 900 mg via INTRAVENOUS

## 2017-04-28 MED ORDER — SODIUM CHLORIDE 0.9 % IR SOLN
Status: DC | PRN
  Administered 2017-04-28: 3000 mL

## 2017-04-28 MED ORDER — EPHEDRINE 5 MG/ML INJ
INTRAVENOUS | Status: AC
  Filled 2017-04-28: qty 10

## 2017-04-28 MED ORDER — IOHEXOL 300 MG/ML  SOLN
INTRAMUSCULAR | Status: DC | PRN
  Administered 2017-04-28: 20 mL

## 2017-04-28 MED ORDER — CLINDAMYCIN PHOSPHATE 900 MG/50ML IV SOLN
INTRAVENOUS | Status: AC
  Filled 2017-04-28: qty 50

## 2017-04-28 MED ORDER — LIDOCAINE 2% (20 MG/ML) 5 ML SYRINGE
INTRAMUSCULAR | Status: AC
  Filled 2017-04-28: qty 5

## 2017-04-28 MED ORDER — HYDROCODONE-ACETAMINOPHEN 7.5-325 MG PO TABS: 1.0000 | ORAL_TABLET | Freq: Once | ORAL | Status: DC | PRN

## 2017-04-28 MED ORDER — FENTANYL CITRATE (PF) 100 MCG/2ML IJ SOLN
INTRAMUSCULAR | Status: AC
  Filled 2017-04-28: qty 2

## 2017-04-28 MED ORDER — LIDOCAINE 2% (20 MG/ML) 5 ML SYRINGE
INTRAMUSCULAR | Status: DC | PRN
  Administered 2017-04-28: 80 mg via INTRAVENOUS

## 2017-04-28 MED ORDER — LACTATED RINGERS IV SOLN
INTRAVENOUS | Status: DC
  Administered 2017-04-28: 09:00:00 via INTRAVENOUS

## 2017-04-28 SURGICAL SUPPLY — 22 items
BAG URO CATCHER STRL LF (MISCELLANEOUS) ×3 IMPLANT
BASKET DAKOTA 1.9FR 11X120 (BASKET) IMPLANT
BASKET ZERO TIP NITINOL 2.4FR (BASKET) IMPLANT
BSKT STON RTRVL ZERO TP 2.4FR (BASKET)
CATH URET 5FR 28IN OPEN ENDED (CATHETERS) ×3 IMPLANT
CLOTH BEACON ORANGE TIMEOUT ST (SAFETY) ×3 IMPLANT
COVER FOOTSWITCH UNIV (MISCELLANEOUS) IMPLANT
GLOVE BIOGEL M STRL SZ7.5 (GLOVE) ×3 IMPLANT
GOWN STRL REUS W/TWL XL LVL3 (GOWN DISPOSABLE) ×3 IMPLANT
GUIDEWIRE ANG ZIPWIRE 038X150 (WIRE) IMPLANT
GUIDEWIRE STR DUAL SENSOR (WIRE) ×3 IMPLANT
MANIFOLD NEPTUNE II (INSTRUMENTS) ×3 IMPLANT
PACK CYSTO (CUSTOM PROCEDURE TRAY) ×3 IMPLANT
SHEATH ACCESS URETERAL 24CM (SHEATH) IMPLANT
SHEATH ACCESS URETERAL 54CM (SHEATH) IMPLANT
SHEATH URETERAL 12FRX35CM (MISCELLANEOUS) IMPLANT
STENT POLARIS 5FRX22 (STENTS) ×2 IMPLANT
SYR 10ML LL (SYRINGE) ×2 IMPLANT
TUBING CONNECTING 10 (TUBING) ×2 IMPLANT
TUBING CONNECTING 10' (TUBING) ×1
TUBING UROLOGY SET (TUBING) ×2 IMPLANT
WIRE COONS/BENSON .038X145CM (WIRE) IMPLANT

## 2017-04-28 NOTE — Transfer of Care (Signed)
Immediate Anesthesia Transfer of Care Note  Patient: Gina Fritz  Procedure(s) Performed: CYSTOSCOPY WITH BILATERAL RETROGRADE PYELOGRAM, RIGHT DIAGNOSTIC URETEROSCOPY,  BIOPSY AND STENT PLACEMENT (Right )  Patient Location: PACU  Anesthesia Type:General  Level of Consciousness: drowsy  Airway & Oxygen Therapy: Patient Spontanous Breathing and Patient connected to face mask  Post-op Assessment: Report given to RN and Post -op Vital signs reviewed and stable  Post vital signs: Reviewed and stable  Last Vitals:  Vitals:   04/28/17 0818  BP: 139/74  Pulse: 64  Resp: 16  Temp: 36.9 C  SpO2: 94%    Last Pain:  Vitals:   04/28/17 0818  TempSrc: Oral      Patients Stated Pain Goal: 3 (83/66/29 4765)  Complications: No apparent anesthesia complications

## 2017-04-28 NOTE — Discharge Instructions (Signed)
DISCHARGE INSTRUCTIONS FOR KIDNEY STONE/URETERAL STENT   MEDICATIONS:  1. Resume all your other meds from home - except do not take any extra narcotic pain meds that you may have at home.  2. Pyridium is to help with the burning/stinging when you urinate. 3. Tramadol is for moderate/severe pain, otherwise taking upto 1000 mg every 6 hours of plainTylenol will help treat your pain.    ACTIVITY:  1. No strenuous activity x 1week  2. No driving while on narcotic pain medications  3. Drink plenty of water  4. Continue to walk at home - you can still get blood clots when you are at home, so keep active, but don't over do it.  5. May return to work/school tomorrow or when you feel ready   BATHING:  1. You can shower and we recommend daily showers   SIGNS/SYMPTOMS TO CALL:  Please call us if you have a fever greater than 101.5, uncontrolled nausea/vomiting, uncontrolled pain, dizziness, unable to urinate, bloody urine, chest pain, shortness of breath, leg swelling, leg pain, redness around wound, drainage from wound, or any other concerns or questions.   You can reach [place] at [phone].   FOLLOW-UP:  1. You have an appointment in 2 weeks for follow-up.

## 2017-04-28 NOTE — H&P (Signed)
gross hematuria evaluation  HPI: Gina Fritz is a 77 year-old female established patient who is here for further evaluation of gross hematuria..  She first noticed the blood approximately 04/04/2017. She has noticed blood a few times.   The patient has not had any recent abdominal imaging. She has been told that she has blood in her urine prior to this episode. The patient has had a previous hematuria evaluation.   The patient has had progression of her voiding symptoms over the last 6 months including worsening incontinence. The patient complains of recent onset back pain.   The patient does not have a history of recurrent UTIs. They have a history of kidney stones. She has not been exposed to occupational hazards that may increase their risks for developing cancer. The patient has no family history of GU malignancy. The patient is a current smoker.     ALLERGIES: Penicillins Sulfa Drugs    MEDICATIONS: Flomax 0.4 mg capsule Oral  Flonase 50 mcg/actuation spray, suspension Nasal  Hydrochlorothiazide 25 mg tablet Oral  Levaquin 250 mg tablet Oral  Lexapro 20 MG Oral Tablet Oral  Lipitor 40 mg tablet Oral  Losartan Potassium 50 mg tablet Oral  Nasacort AQ 55 MCG/ACT AERS Nasal  Ranitidine Hcl 300 mg tablet Oral  Singulair 10 MG Oral Tablet Oral  Tramadol Hcl 50 mg tablet 0 Oral  Vitamin D3 2,000 unit tablet Oral     GU PSH: Cystoscopy Insert Stent - 2011 Ureteroscopic stone removal - 2011      PSH Notes: Cataract Surgery, Cystoscopy With Insertion Of Ureteral Stent Left, Cystoscopy With Ureteroscopy With Removal Of Calculus, Surgical Excision Of Tubal Pregnancy, Nose Surgery   NON-GU PSH: Treat Ectopic Pregnancy - 2011    GU PMH: Renal calculus, Nephrolithiasis - 06/21/2015 Ureteral calculus, Calculus of ureter - 03/16/2015 Urinary Frequency, Increased urinary frequency - 03/02/2015 Pelvic/perineal pain, Female pelvic pain - 02/03/2015 Abnormal uterine and vaginal bleeding,  unspecified, Abnormal vaginal bleeding - 01/28/2015 Gross hematuria, Gross hematuria - 01/20/2015    NON-GU PMH: Encounter for general adult medical examination without abnormal findings, Encounter for preventive health examination - 06/22/2015 Other lack of coordination, Muscular incoordination - 02/03/2015 Other muscle spasm, Muscle spasm - 02/03/2015 Anxiety, Anxiety (Symptom) - 2014 Personal history of other diseases of the circulatory system, History of hypertension - 2014 Personal history of other endocrine, nutritional and metabolic disease, History of hypercholesterolemia - 2014 Personal history of other mental and behavioral disorders, History of depression - 2014 Personal history of other specified conditions, History of heartburn - 2014    FAMILY HISTORY: Depression - Father Family Health Status Number - Runs In Family Father Deceased At Age15 ___ - Runs In Family Hypertension - Mother Mother Deceased At Age 64 from diabetic complicati - Runs In Family Stroke Syndrome - Mother Strokes - Runs In Family Suicide Completion - Mother   SOCIAL HISTORY: Marital Status: Married Preferred Language: English; Ethnicity: Not Hispanic Or Latino; Race: White Current Smoking Status: Patient smokes.   Tobacco Use Assessment Completed: Used Tobacco in last 30 days?     Notes: Current every day smoker, Retired, Number of children, Caffeine Use, Being A Economist, Occupation:, Tobacco Use, Marital History - Currently Married   REVIEW OF SYSTEMS:    GU Review Female:   Patient reports get up at night to urinate, leakage of urine, stream starts and stops, and trouble starting your stream. Patient denies frequent urination, hard to postpone urination, burning /pain with urination, have to strain  to urinate, and being pregnant.  Gastrointestinal (Upper):   Patient denies nausea, vomiting, and indigestion/ heartburn.  Gastrointestinal (Lower):   Patient denies diarrhea and constipation.   Constitutional:   Patient reports fatigue. Patient denies fever, night sweats, and weight loss.  Skin:   Patient denies skin rash/ lesion and itching.  Eyes:   Patient denies blurred vision and double vision.  Ears/ Nose/ Throat:   Patient denies sore throat and sinus problems.  Hematologic/Lymphatic:   Patient denies swollen glands and easy bruising.  Cardiovascular:   Patient denies leg swelling and chest pains.  Respiratory:   Patient denies cough and shortness of breath.  Endocrine:   Patient denies excessive thirst.  Musculoskeletal:   Patient reports back pain. Patient denies joint pain.  Neurological:   Patient denies headaches and dizziness.  Psychologic:   Patient reports depression. Patient denies anxiety.   VITAL SIGNS:      04/06/2017 02:28 PM  Weight 120 lb / 54.43 kg  Height 62 in / 157.48 cm  BP 129/75 mmHg  Pulse 68 /min  BMI 21.9 kg/m   GU PHYSICAL EXAMINATION:    External Genitalia: No hirsutism, no rash, no scarring, no cyst, no erythematous lesion, no papular lesion, no blanched lesion, no warty lesion. No edema.  Urethral Meatus: Normal size. Normal position. No discharge.  Bladder: Normal to palpation, no tenderness, no mass, normal size.  Vagina: No atrophy, no stenosis. No rectocele. No cystocele. No enterocele.   MULTI-SYSTEM PHYSICAL EXAMINATION:       PAST DATA REVIEWED:  Source Of History:  Patient  Records Review:   Previous Doctor Records, Previous Hospital Records, Previous Patient Records  X-Ray Review: C.T. Chest: Reviewed Films.  C.T. Abdomen/Pelvis: Reviewed Films.     PROCEDURES:         Flexible Cystoscopy - 52000  Risks, benefits, and some of the potential complications of the procedure were discussed at length with the patient including infection, bleeding, voiding discomfort, urinary retention, fever, chills, sepsis, and others. All questions were answered. Informed consent was obtained. Antibiotic prophylaxis was given. Sterile  technique and intraurethral analgesia were used.  Meatus:  Normal size. Normal location. Normal condition.  Urethra:  No hypermobility. No leakage.  Ureteral Orifices:  Normal location. Normal size. Normal shape. Effluxed clear urine.  Bladder:  No trabeculation. No tumors. Normal mucosa. No stones.      The lower urinary tract was carefully examined. The procedure was well-tolerated and without complications. Antibiotic instructions were given. Instructions were given to call the office immediately for bloody urine, difficulty urinating, urinary retention, painful or frequent urination, fever, chills, nausea, vomiting or other illness. The patient stated that she understood these instructions and would comply with them.         Urinalysis w/Scope Dipstick Dipstick Cont'd Micro  Color: Amber Bilirubin: Neg WBC/hpf: 0 - 5/hpf  Appearance: Cloudy Ketones: Trace RBC/hpf: 40 - 60/hpf  Specific Gravity: 1.025 Blood: 2+ Bacteria: NS (Not Seen)  pH: 6.0 Protein: 1+ Cystals: NS (Not Seen)  Glucose: Neg Urobilinogen: 0.2 Casts: NS (Not Seen)    Nitrites: Neg Trichomonas: Not Present    Leukocyte Esterase: Neg Mucous: Present      Epithelial Cells: 0 - 5/hpf      Yeast: NS (Not Seen)      Sperm: Not Present    ASSESSMENT:      ICD-10 Details  1 GU:   Gross hematuria - R31.0    PLAN:  Orders Labs BMP, Urine Culture          Schedule X-Rays: 1 Week - C.T. Hematuria With and Without I.V.  Return Visit/Planned Activity: 2 Weeks          Document Letter(s):  Created for Patient: Clinical Summary    The patient and I talked at length about etiologies of hematuria. We discussed the implications of hematuria and many of the possible etiologies including infection, malignancy, scar tissue, idiopathic, urinary tract stones, intrinsic renal disease, the use of blood thinners, bacteriuria, heavy lifting, straining, constipation, UTI, and others. We discussed the fact that hematuria can  be indicative of serious urinary tract pathology. I also detailed for the patient the workup for hematuria which would include upper tract imaging and cystoscopy.            Notes:   The patient's lower urinary tract evaluation as a cause of her bacteria is negative. I do recommend that we proceed with a hematuria protocol CT scan for complete evaluation given her risk factors of smoking. Previously, she did have a kidney stone that was likely the etiology of her hematuria which she passed. Potentially, this could be another stone for her. I'll plan to follow up with her after we have completed her CT scan. She will need labs prior.

## 2017-04-28 NOTE — Anesthesia Procedure Notes (Signed)
Procedure Name: LMA Insertion Date/Time: 04/28/2017 10:27 AM Performed by: Claudia Desanctis, CRNA Pre-anesthesia Checklist: Emergency Drugs available, Patient identified, Suction available and Patient being monitored Patient Re-evaluated:Patient Re-evaluated prior to induction Oxygen Delivery Method: Circle system utilized Preoxygenation: Pre-oxygenation with 100% oxygen Induction Type: IV induction Ventilation: Mask ventilation without difficulty LMA: LMA inserted LMA Size: 3.0 Number of attempts: 1 Placement Confirmation: positive ETCO2 and breath sounds checked- equal and bilateral Tube secured with: Tape Dental Injury: Teeth and Oropharynx as per pre-operative assessment

## 2017-04-28 NOTE — Anesthesia Postprocedure Evaluation (Signed)
Anesthesia Post Note  Patient: Gina Fritz  Procedure(s) Performed: CYSTOSCOPY WITH BILATERAL RETROGRADE PYELOGRAM, RIGHT DIAGNOSTIC URETEROSCOPY,  BIOPSY AND STENT PLACEMENT (Right )     Patient location during evaluation: PACU Anesthesia Type: General Level of consciousness: awake and alert Pain management: pain level controlled Vital Signs Assessment: post-procedure vital signs reviewed and stable Respiratory status: spontaneous breathing, nonlabored ventilation, respiratory function stable and patient connected to nasal cannula oxygen Cardiovascular status: blood pressure returned to baseline and stable Postop Assessment: no apparent nausea or vomiting Anesthetic complications: no    Last Vitals:  Vitals:   04/28/17 1208 04/28/17 1223  BP: 120/65 (!) 119/59  Pulse: 65 66  Resp: 20 20  Temp: 36.6 C 36.7 C  SpO2: 94% 94%    Last Pain:  Vitals:   04/28/17 1223  TempSrc: Oral  PainSc: 2                  Barnet [place]

## 2017-04-28 NOTE — Progress Notes (Signed)
Pt assisted to the bathroom and urinated at this time. Pt is alert, calm and ambulatory and has no s/s of distress. VS and roders assessed and pt denies additional complaints. Will continue to monitor and tx pt according to MD orders.

## 2017-04-28 NOTE — Op Note (Signed)
Preoperative diagnosis:  1. Right ureteral mass and hydronephrosis  Postoperative diagnosis:  1. Same  Procedure: 1. Cystoscopy 2. Bilateral retrograde pyelograms with interpretation 3. Right ureteroscopy and ureteral biopsy 4. Right ureteral stent placement  Surgeon: Ardis Hughs, MD  Anesthesia: General  Complications: None  Intraoperative findings:  #1: The left retrograde pyelogram was performed using 5 cc of Omnipaque contrast and demonstrated a normal caliber ureter with no hydronephrosis or filling defects.  The calyces were sharp. #2: The right retrograde pyelogram was performed using 10 cc of Omnipaque contrast and noted a normal caliber ureter to the level of the sacrum at which point there was a acute narrowing for about 1 cm followed by severe hydroureteronephrosis and blunting of the calyces.  There was no appreciable filling defect. #3: Ureteroscopy demonstrated a narrowing in the mid ureter at the level of the seen stenosis but no appreciable mass.  There was a slight irregularity of the mucosa which was biopsied. #4: 22 cm x5 French Polaris stent was placed in the right ureter.  EBL: Minimal  Specimens:  #1: Right ureteral washing for cytology #2: Right ureteral wall biopsies  Indication: Gina Fritz is a 77 y.o. patient with gross hematuria.  Evaluation demonstrated a enhancing mass in the right mid ureter, presumed to be within the lumen of the ureter with associated proximal hydroureteronephrosis.  After reviewing the management options for treatment, he elected to proceed with the above surgical procedure(s). We have discussed the potential benefits and risks of the procedure, side effects of the proposed treatment, the likelihood of the patient achieving the goals of the procedure, and any potential problems that might occur during the procedure or recuperation. Informed consent has been obtained.  Description of procedure:  The patient was taken  to the operating room and general anesthesia was induced.  The patient was placed in the dorsal lithotomy position, prepped and draped in the usual sterile fashion, and preoperative antibiotics were administered. A preoperative time-out was performed.   Using a 21 French cystoscope and a 30 degree lens gently passed into the patient's urethra and into the bladder.  Cystoscopy demonstrated a normal caliber ureter with no significant abnormalities.  The ureteral orifice ease were orthotopic.  A retrograde pyelogram was performed with a 5 [place] open-ended catheter in the left ureter with the above findings.  I then used the 5 [place] catheter and performed retrograde pyelogram on the patient's right-hand side with the above findings.  I then advanced a 0.038 sensor wire through the open-ended catheter and up into the right renal pelvis.  The catheter was then removed over the wire.  I then backed out the cystoscope.  I then advanced a 4/6 French semirigid dual lumen ureteroscope through the urethra and into the bladder.  I gently cannulated the right ureter and advanced it up to the narrowing.  There were no significant tumors or abnormalities noted aside from acute narrowing.  I then sent a barbotaged urine specimen for cytology.  I subsequently used the flexible graspers and biopsied the ureter in that area.  I subsequently advanced the scope all the way up to the renal pelvis noting no other significant abnormality.  I then gently backed out the ureteroscope and advanced the cystoscope back into the patient's bladder over the wire.  I then placed a 22 cm x5 [place] Polaris ureteral stent through the right ureter and up into the right collecting system under fluoroscopic guidance.  I advanced it to the ureteral  orifice and then remove the wire.  The bladder was then emptied.  The patient was subsequently extubated and returned to the PACU in stable condition.  There were no complications.  Ardis Hughs,  M.D.

## 2017-04-28 NOTE — Anesthesia Preprocedure Evaluation (Signed)
Anesthesia Evaluation  Patient identified by MRN, date of birth, ID band Patient awake    Reviewed: Allergy & Precautions, NPO status , Patient's Chart, lab work & pertinent test results  Airway Mallampati: II  TM Distance: >3 FB Neck ROM: Full    Dental  (+) Edentulous Upper, Edentulous Lower   Pulmonary neg pulmonary ROS, COPD, Current Smoker,    Pulmonary exam normal        Cardiovascular hypertension, negative cardio ROS   Rhythm:Regular Rate:Normal     Neuro/Psych negative neurological ROS  negative psych ROS   GI/Hepatic negative GI ROS, Neg liver ROS, GERD  ,  Endo/Other  negative endocrine ROS  Renal/GU negative Renal ROS  negative genitourinary   Musculoskeletal negative musculoskeletal ROS (+) Arthritis ,   Abdominal   Peds  Hematology negative hematology ROS (+)   Anesthesia Other Findings   Reproductive/Obstetrics                             Anesthesia Physical Anesthesia Plan  ASA: III  Anesthesia Plan: General   Post-op Pain Management:    Induction: Intravenous  PONV Risk Score and Plan: 2 and Treatment may vary due to age or medical condition  Airway Management Planned: LMA and Video Laryngoscope Planned  Additional Equipment:   Intra-op Plan:   Post-operative Plan: Extubation in OR  Informed Consent: I have reviewed the patients History and Physical, chart, labs and discussed the procedure including the risks, benefits and alternatives for the proposed anesthesia with the patient or authorized representative who has indicated his/her understanding and acceptance.   Dental advisory given  Plan Discussed with: CRNA  Anesthesia Plan Comments:         Lab Results  Component Value Date   WBC 5.3 04/26/2017   HGB 12.8 04/26/2017   HCT 39.9 04/26/2017   MCV 83.5 04/26/2017   PLT 244 04/26/2017   Lab Results  Component Value Date   CREATININE  0.78 04/26/2017   BUN 13 04/26/2017   NA 140 04/26/2017   K 4.0 04/26/2017   CL 104 04/26/2017   CO2 27 04/26/2017     Anesthesia Quick Evaluation

## 2017-05-01 ENCOUNTER — Encounter (HOSPITAL_COMMUNITY): Payer: Self-pay | Admitting: Urology

## 2017-05-09 ENCOUNTER — Other Ambulatory Visit: Payer: Self-pay | Admitting: Family Medicine

## 2017-05-09 DIAGNOSIS — E785 Hyperlipidemia, unspecified: Secondary | ICD-10-CM

## 2017-05-13 ENCOUNTER — Other Ambulatory Visit: Payer: Self-pay | Admitting: Family Medicine

## 2017-05-13 DIAGNOSIS — N3281 Overactive bladder: Secondary | ICD-10-CM

## 2017-06-09 ENCOUNTER — Encounter (INDEPENDENT_AMBULATORY_CARE_PROVIDER_SITE_OTHER): Payer: Medicare Other | Admitting: Ophthalmology

## 2017-06-09 DIAGNOSIS — I10 Essential (primary) hypertension: Secondary | ICD-10-CM | POA: Diagnosis not present

## 2017-06-09 DIAGNOSIS — H43813 Vitreous degeneration, bilateral: Secondary | ICD-10-CM | POA: Diagnosis not present

## 2017-06-09 DIAGNOSIS — H353231 Exudative age-related macular degeneration, bilateral, with active choroidal neovascularization: Secondary | ICD-10-CM | POA: Diagnosis not present

## 2017-06-09 DIAGNOSIS — H35033 Hypertensive retinopathy, bilateral: Secondary | ICD-10-CM

## 2017-06-22 ENCOUNTER — Other Ambulatory Visit: Payer: Self-pay | Admitting: Family Medicine

## 2017-06-27 ENCOUNTER — Encounter: Payer: Self-pay | Admitting: Family Medicine

## 2017-06-27 ENCOUNTER — Ambulatory Visit: Payer: Medicare Other | Admitting: Family Medicine

## 2017-06-27 VITALS — BP 110/70 | HR 79 | Temp 98.2°F | Resp 12 | Ht 62.0 in | Wt 113.1 lb

## 2017-06-27 DIAGNOSIS — N758 Other diseases of Bartholin's gland: Secondary | ICD-10-CM | POA: Diagnosis not present

## 2017-06-27 MED ORDER — CLINDAMYCIN HCL 300 MG PO CAPS: 300.0000 mg | ORAL_CAPSULE | Freq: Three times a day (TID) | ORAL | 0 refills | Status: AC

## 2017-06-27 NOTE — Patient Instructions (Addendum)
A few things to remember from today's visit:   Bartholinitis - Plan: clindamycin (CLEOCIN) 300 MG capsule  If not great improvement in 72 hours please let me know, in this case we may need gynecology evaluation.  Bartholin Cyst or Abscess A Bartholin cyst is a fluid-filled sac that forms on a Bartholin gland. Bartholin glands are small glands that are found in the folds of skin (labia) on the sides of the lower opening of the vagina. This type of cyst causes a bulge on the side of the vagina. A cyst that is not large or infected may not cause problems. However, if the fluid in the cyst becomes infected, the cyst can turn into an abscess. An abscess may cause discomfort or pain. Follow these instructions at home:  Take medicines only as told by your doctor.  If you were prescribed an antibiotic medicine, finish all of it even if you start to feel better.  Apply warm, wet compresses to the area or take warm, shallow baths that cover your pelvic area (sitz baths). Do this many times each day or as told by your doctor.  Do not squeeze the cyst. Do not apply heavy pressure to it.  Do not have sex until the cyst has gone away.  If your cyst or abscess was opened by your doctor, a small piece of gauze or a drain may have been placed in the area. That lets the cyst drain. Do not remove the gauze or the drain until your doctor tells you it is okay to do that.  Do not wear tampons. Wear feminine pads as needed for any fluid or blood.  Keep all follow-up visits as told by your doctor. This is important. Contact a doctor if:  Your pain, puffiness (swelling), or redness in the area of the cyst gets worse.  You have fluid or pus pus coming from the cyst.  You have a fever. This information is not intended to replace advice given to you by your health care provider. Make sure you discuss any questions you have with your health care provider. Document Released: 06/17/2008 Document Revised:  08/27/2015 Document Reviewed: 11/04/2013 Elsevier Interactive Patient Education  2018 [employer].  Please be sure medication list is accurate. If a new problem present, please set up appointment sooner than planned today.

## 2017-06-27 NOTE — Progress Notes (Signed)
ACUTE VISIT   HPI:  Chief Complaint  Patient presents with  . Ingrown hair    possible ingrown hair near vaginal area, swollen, noticed 1 week ago    Ms.Deni Berti is a 77 y.o. female, who is here today with her husband complaining of 5-7 days of lesion on external genitals. Gradual onset. Started as a small "dot" and growing. Lesion is tender. Pain exacerbated by palpation She has not noted vaginal discharge of bleeding. No Hx of trauma.  No urinary symptoms..  She has not tried OTC treatment.  Review of Systems  Constitutional: Negative for chills, fatigue and fever.  Gastrointestinal: Negative for abdominal pain, nausea and vomiting.  Genitourinary: Negative for decreased urine volume, dysuria, hematuria, vaginal bleeding and vaginal discharge.  Musculoskeletal: Positive for back pain (chronic).  Skin: Negative for wound.  Neurological: Negative for numbness.  Psychiatric/Behavioral: Negative for confusion. The patient is nervous/anxious.       Current Outpatient Medications on File Prior to Visit  Medication Sig Dispense Refill  . acetaminophen (TYLENOL) 325 MG tablet Take 2 tablets (650 mg total) by mouth every 6 (six) hours as needed for mild pain (or Fever >/= 101). 30 tablet 1  . albuterol (PROAIR HFA) 108 (90 Base) MCG/ACT inhaler Inhale 2 puffs into the lungs every 6 (six) hours as needed for wheezing or shortness of breath. 1 Inhaler 2  . alendronate (FOSAMAX) 70 MG tablet Take 1 tablet (70 mg total) every Sunday by mouth. 13 tablet 3  . amLODipine (NORVASC) 5 MG tablet Take 1 tablet (5 mg total) by mouth daily. 90 tablet 1  . aspirin EC 81 MG tablet Take 81 mg by mouth daily.    Marland Kitchen atorvastatin (LIPITOR) 20 MG tablet TAKE 1 TABLET(20 MG) BY MOUTH DAILY 90 tablet 0  . belladona alk-PHENObarbital (DONNATAL) 16.2 MG tablet Take 1 tablet by mouth daily as needed (ibs). No more than 2 tabs per month. 15 tablet 1  . BESIVANCE 0.6 % SUSP Place 1  drop into both eyes See admin instructions. Takes the day before and the day of the dr visit 4 times a day    . Blood Pressure Monitoring (ADULT BLOOD PRESSURE CUFF LG) KIT 1 applicator by Miscellaneous route daily.    . Cholecalciferol (VITAMIN D) 2000 UNITS CAPS Take 2,000 Units by mouth daily.    Marland Kitchen escitalopram (LEXAPRO) 20 MG tablet TAKE 1 TABLET(20 MG) BY MOUTH DAILY 90 tablet 1  . fluticasone (FLONASE) 50 MCG/ACT nasal spray SHAKE LIQUID AND USE 2 SPRAYS IN EACH NOSTRIL DAILY 16 g 5  . Glycopyrrolate-Formoterol (BEVESPI AEROSPHERE) 9-4.8 MCG/ACT AERO Inhale 2 puffs into the lungs 2 (two) times daily. 1 Inhaler 0  . losartan (COZAAR) 50 MG tablet TAKE 1 TABLET(50 MG) BY MOUTH TWICE DAILY 180 tablet 0  . montelukast (SINGULAIR) 10 MG tablet TAKE 1 TABLET(10 MG) BY MOUTH DAILY 90 tablet 1  . Multiple Vitamin (MULTIVITAMIN WITH MINERALS) TABS Take 1 tablet by mouth daily.    Marland Kitchen nystatin (MYCOSTATIN) 100000 UNIT/ML suspension SWISH AND SWALLOW 5 MILLITERS FOUR TIMES A DAY FOR 10 DAYS 473 mL 1  . omeprazole (PRILOSEC) 20 MG capsule TAKE ONE CAPSULE BY MOUTH EVERY DAY 90 capsule 2  . phenazopyridine (PYRIDIUM) 200 MG tablet Take 1 tablet (200 mg total) by mouth 3 (three) times daily as needed for pain. 10 tablet 0  . polyethylene glycol (MIRALAX / GLYCOLAX) packet Take 17 g by mouth daily as needed for  mild constipation.    . ranitidine (ZANTAC) 300 MG tablet Take 1 tablet (300 mg total) at bedtime by mouth. 90 tablet 3  . traMADol (ULTRAM) 50 MG tablet Take 1-2 tablets (50-100 mg total) by mouth every 6 (six) hours as needed for moderate pain. 15 tablet 0  . trimethoprim-polymyxin b (POLYTRIM) ophthalmic solution     . VESICARE 10 MG tablet TAKE 1 TABLET(10 MG) BY MOUTH DAILY 90 tablet 0  . vitamin B-12 (CYANOCOBALAMIN) 1000 MCG tablet Take 1,000 mcg by mouth daily.    Marland Kitchen Zoster Vaccine Adjuvanted [facility]) injection 0.5 ml in muscle and repeat in 8 weeks 0.5 mL 1   No current  facility-administered medications on file prior to visit.      Past Medical History:  Diagnosis Date  . Allergic rhinitis   . Anxiety   . Aortic atherosclerosis ([place]) 04/21/2017   noted on CT Chest  . Arthritis    lower back  . Asthma   . Back pain   . CAP (community acquired pneumonia)    dx 02-05-2015  . Colitis    dx 02-05-2015  . COPD with emphysema ([place])   . Depression   . Dizziness   . GERD (gastroesophageal reflux disease)   . Headache    Migraines  . History of chronic sinusitis   . History of kidney stones   . Hyperlipidemia   . Hypertension   . Irritable bowel syndrome   . Loss of weight   . Macular degeneration, bilateral   . Oral thrush   . Pulmonary nodules    benign and stable per CT  . Right ureteral stone   . Sensorineural hearing loss, unilateral   . Subjective tinnitus    Allergies  Allergen Reactions  . Penicillins Anaphylaxis    Has patient had a PCN reaction causing immediate rash, facial/tongue/throat swelling, SOB or lightheadedness with hypotension: Yes Has patient had a PCN reaction causing severe rash involving mucus membranes or skin necrosis: Yes Has patient had a PCN reaction that required hospitalization Yes Has patient had a PCN reaction occurring within the last 10 years: No If all of the above answers are "NO", then may proceed with Cephalosporin use.   . Sulfa Antibiotics Anaphylaxis and Hives  . Soy Allergy Itching and Rash    Social History   Socioeconomic History  . Marital status: Married    Spouse name: [name]  . Number of children: 3  . Years of education: college  . Highest education level: Not on file  Occupational History    Comment: Retired  Scientific laboratory technician  . Financial resource strain: Not on file  . Food insecurity:    Worry: Not on file    Inability: Not on file  . Transportation needs:    Medical: Not on file    Non-medical: Not on file  Tobacco Use  . Smoking status: Current Some Day Smoker     Packs/day: 0.15    Years: 25.00    Pack years: 3.75    Types: Cigarettes  . Smokeless tobacco: Never Used  . Tobacco comment: Thinks her smoking was more than 25 years abut is not exact   Substance and Sexual Activity  . Alcohol use: Yes    Alcohol/week: 0.6 oz    Types: 1 Glasses of wine per week    Comment: One drink a week.  . Drug use: No  . Sexual activity: Not on file  Lifestyle  . Physical activity:  Days per week: Not on file    Minutes per session: Not on file  . Stress: Not on file  Relationships  . Social connections:    Talks on phone: Not on file    Gets together: Not on file    Attends religious service: Not on file    Active member of club or organization: Not on file    Attends meetings of clubs or organizations: Not on file    Relationship status: Not on file  Other Topics Concern  . Not on file  Social History Narrative   Patient is married (Barry) and lives at home with her husband.   Retired. Part time [employer].   Education- College education.   Right handed.   Caffeine- Six cups of coffee daily and six cups of coke cola daily.          Vitals:   06/27/17 1028  BP: 110/70  Pulse: 79  Resp: 12  Temp: 98.2 F (36.8 C)  SpO2: 92%   Body mass index is 20.69 kg/m.    Physical Exam  Nursing note and vitals reviewed. Constitutional: She is oriented to person, place, and time. She appears well-developed and well-nourished. No distress.  HENT:  Head: Normocephalic and atraumatic.  Mouth/Throat: Mucous membranes are normal.  Eyes: Conjunctivae are normal.  Respiratory: Effort normal and breath sounds normal. No respiratory distress.  GI: Soft. She exhibits no mass. There is no tenderness.  Genitourinary: There is tenderness on the right labia. No bleeding in the vagina. No vaginal discharge found.  Genitourinary Comments: About 2-2.5 cm nodular lesion lateral to minor labia right side. No fluctuant area noted. +Erythema and edema.    Musculoskeletal: She exhibits no edema.       Lumbar back: She exhibits tenderness. She exhibits no bony tenderness.  Neurological: She is alert and oriented to person, place, and time.  Stable gait assisted with a cane.   Skin: Skin is warm. Lesion (see GU) noted.  Psychiatric: She has a normal mood and affect. Her speech is normal.  Well groomed, good eye contact.     ASSESSMENT AND PLAN:   Ms. Johari was seen today for ingrown hair.  Diagnoses and all orders for this visit:  Bartholinitis -     clindamycin (CLEOCIN) 300 MG capsule; Take 1 capsule (300 mg total) by mouth 3 (three) times daily for 7 days.  Clindamycin side effects discussed. Bath sitz daily. Instructed to let me k ow if not greatly better in 72 hours, in which case we will arrange gyn appt. Instructed about warning signs.    -Ms.Insiya Popper Ammirati was advised to seek immediate medical attention if sudden worsening symptoms or to follow if they persist or if new concerns arise.       Betty G. Jordan, MD  [facility]. [place] office.             

## 2017-07-01 ENCOUNTER — Encounter: Payer: Self-pay | Admitting: Family Medicine

## 2017-07-05 ENCOUNTER — Ambulatory Visit: Payer: Medicare Other | Admitting: Family Medicine

## 2017-07-05 ENCOUNTER — Encounter: Payer: Self-pay | Admitting: Family Medicine

## 2017-07-05 VITALS — BP 112/70 | HR 91 | Temp 98.5°F | Resp 16 | Ht 62.0 in | Wt 111.5 lb

## 2017-07-05 DIAGNOSIS — J432 Centrilobular emphysema: Secondary | ICD-10-CM

## 2017-07-05 DIAGNOSIS — I10 Essential (primary) hypertension: Secondary | ICD-10-CM | POA: Diagnosis not present

## 2017-07-05 DIAGNOSIS — R197 Diarrhea, unspecified: Secondary | ICD-10-CM

## 2017-07-05 DIAGNOSIS — R2681 Unsteadiness on feet: Secondary | ICD-10-CM

## 2017-07-05 DIAGNOSIS — K219 Gastro-esophageal reflux disease without esophagitis: Secondary | ICD-10-CM | POA: Diagnosis not present

## 2017-07-05 MED ORDER — LOSARTAN POTASSIUM 50 MG PO TABS: 50.0000 mg | ORAL_TABLET | Freq: Every day | ORAL | 0 refills | Status: DC

## 2017-07-05 NOTE — Progress Notes (Signed)
ACUTE VISIT   HPI:  Chief Complaint  Patient presents with  . Follow-up    wants an xray done, not feeling right, hx of pneumonia    [name] is a 77 y.o. female, who is here today with her husband complaining of feeling "funny."  Unstable gait, almost felt this past weekend, hit left rib cage against door frame.   She is having moderate pain, states that she has had rib fractures in the past and pain is not as bad as it was at that time.  She has history of unstable gait, worse for the past few days. She completed PT, she uses a cane most of the time but she also has a walker at home.  COPD: She is coughing "a little bit" more with some sputum.  No changes in dyspnea and wheezing.  She is afraid of having pneumonia. She has no noted fever, chills, or hemoptysis.  She is having some heartburn and burping, history of GERD.  Hypertension: Noted BP on lower normal range. BP at home "very low", according to husband, 100/50. She is not drinking enough fluids. She has not noted decreasing urine output, dysuria, or gross hematuria. Currently she is on Cozaar 50 mg twice daily and Amlodipine 5 mg daily. No chest pain, palpitations, or diaphoresis.  She recently completed treatment with clindamycin for mild bartholinitis.  She is reporting that pain has resolved and lesion has decreased in size.  She tolerated antibiotic well. She had an episode of diarrhea today, no associated abdominal pain,nausea, or vomiting.  According to patient, a couple days ago she had some blood work done.  Her husband states that CBC was done he is not sure about BMP.   Review of Systems  Constitutional: Positive for activity change and fatigue. Negative for appetite change, diaphoresis and fever.  HENT: Negative for mouth sores, nosebleeds, sore throat and trouble swallowing.   Eyes: Negative for redness and visual disturbance.  Respiratory: Positive for cough, shortness of breath and  wheezing.        COPD symptoms overall stable.  Cardiovascular: Negative for chest pain, palpitations and leg swelling.  Gastrointestinal: Positive for diarrhea. Negative for abdominal pain, nausea and vomiting.       Negative for changes in bowel habits.  Genitourinary: Negative for decreased urine volume, difficulty urinating, dysuria and hematuria.  Musculoskeletal: Positive for back pain and gait problem.  Skin: Negative for rash.  Allergic/Immunologic: Positive for environmental allergies.  Neurological: Positive for light-headedness. Negative for syncope, weakness and headaches.  Psychiatric/Behavioral: Negative for confusion. The patient is nervous/anxious.       Current Outpatient Medications on File Prior to Visit  Medication Sig Dispense Refill  . acetaminophen (TYLENOL) 325 MG tablet Take 2 tablets (650 mg total) by mouth every 6 (six) hours as needed for mild pain (or Fever >/= 101). 30 tablet 1  . albuterol (PROAIR HFA) 108 (90 Base) MCG/ACT inhaler Inhale 2 puffs into the lungs every 6 (six) hours as needed for wheezing or shortness of breath. 1 Inhaler 2  . alendronate (FOSAMAX) 70 MG tablet Take 1 tablet (70 mg total) every Sunday by mouth. 13 tablet 3  . amLODipine (NORVASC) 5 MG tablet Take 1 tablet (5 mg total) by mouth daily. 90 tablet 1  . aspirin EC 81 MG tablet Take 81 mg by mouth daily.    Marland Kitchen atorvastatin (LIPITOR) 20 MG tablet TAKE 1 TABLET(20 MG) BY MOUTH DAILY 90 tablet 0  .  belladona alk-PHENObarbital (DONNATAL) 16.2 MG tablet Take 1 tablet by mouth daily as needed (ibs). No more than 2 tabs per month. 15 tablet 1  . BESIVANCE 0.6 % SUSP Place 1 drop into both eyes See admin instructions. Takes the day before and the day of the dr visit 4 times a day    . Blood Pressure Monitoring (ADULT BLOOD PRESSURE CUFF LG) KIT 1 applicator by Miscellaneous route daily.    . Cholecalciferol (VITAMIN D) 2000 UNITS CAPS Take 2,000 Units by mouth daily.    Marland Kitchen escitalopram  (LEXAPRO) 20 MG tablet TAKE 1 TABLET(20 MG) BY MOUTH DAILY 90 tablet 1  . fluticasone (FLONASE) 50 MCG/ACT nasal spray SHAKE LIQUID AND USE 2 SPRAYS IN EACH NOSTRIL DAILY 16 g 5  . Glycopyrrolate-Formoterol (BEVESPI AEROSPHERE) 9-4.8 MCG/ACT AERO Inhale 2 puffs into the lungs 2 (two) times daily. 1 Inhaler 0  . montelukast (SINGULAIR) 10 MG tablet TAKE 1 TABLET(10 MG) BY MOUTH DAILY 90 tablet 1  . Multiple Vitamin (MULTIVITAMIN WITH MINERALS) TABS Take 1 tablet by mouth daily.    Marland Kitchen nystatin (MYCOSTATIN) 100000 UNIT/ML suspension SWISH AND SWALLOW 5 MILLITERS FOUR TIMES A DAY FOR 10 DAYS 473 mL 1  . omeprazole (PRILOSEC) 20 MG capsule TAKE ONE CAPSULE BY MOUTH EVERY DAY 90 capsule 2  . phenazopyridine (PYRIDIUM) 200 MG tablet Take 1 tablet (200 mg total) by mouth 3 (three) times daily as needed for pain. 10 tablet 0  . polyethylene glycol (MIRALAX / GLYCOLAX) packet Take 17 g by mouth daily as needed for mild constipation.    . ranitidine (ZANTAC) 300 MG tablet Take 1 tablet (300 mg total) at bedtime by mouth. 90 tablet 3  . traMADol (ULTRAM) 50 MG tablet Take 1-2 tablets (50-100 mg total) by mouth every 6 (six) hours as needed for moderate pain. 15 tablet 0  . trimethoprim-polymyxin b (POLYTRIM) ophthalmic solution     . VESICARE 10 MG tablet TAKE 1 TABLET(10 MG) BY MOUTH DAILY 90 tablet 0  . vitamin B-12 (CYANOCOBALAMIN) 1000 MCG tablet Take 1,000 mcg by mouth daily.    Marland Kitchen Zoster Vaccine Adjuvanted [facility]) injection 0.5 ml in muscle and repeat in 8 weeks 0.5 mL 1   No current facility-administered medications on file prior to visit.      Past Medical History:  Diagnosis Date  . Allergic rhinitis   . Anxiety   . Aortic atherosclerosis ([place]) 04/21/2017   noted on CT Chest  . Arthritis    lower back  . Asthma   . Back pain   . CAP (community acquired pneumonia)    dx 02-05-2015  . Colitis    dx 02-05-2015  . COPD with emphysema ([place])   . Depression   . Dizziness   . GERD  (gastroesophageal reflux disease)   . Headache    Migraines  . History of chronic sinusitis   . History of kidney stones   . Hyperlipidemia   . Hypertension   . Irritable bowel syndrome   . Loss of weight   . Macular degeneration, bilateral   . Oral thrush   . Pulmonary nodules    benign and stable per CT  . Right ureteral stone   . Sensorineural hearing loss, unilateral   . Subjective tinnitus    Allergies  Allergen Reactions  . Penicillins Anaphylaxis    Has patient had a PCN reaction causing immediate rash, facial/tongue/throat swelling, SOB or lightheadedness with hypotension: Yes Has patient had a PCN reaction causing  severe rash involving mucus membranes or skin necrosis: Yes Has patient had a PCN reaction that required hospitalization Yes Has patient had a PCN reaction occurring within the last 10 years: No If all of the above answers are "NO", then may proceed with Cephalosporin use.   . Sulfa Antibiotics Anaphylaxis and Hives  . Soy Allergy Itching and Rash    Social History   Socioeconomic History  . Marital status: Married    Spouse name: Gina Fritz  . Number of children: 3  . Years of education: college  . Highest education level: Not on file  Occupational History    Comment: Retired  Scientific laboratory technician  . Financial resource strain: Not on file  . Food insecurity:    Worry: Not on file    Inability: Not on file  . Transportation needs:    Medical: Not on file    Non-medical: Not on file  Tobacco Use  . Smoking status: Current Some Day Smoker    Packs/day: 0.15    Years: 25.00    Pack years: 3.75    Types: Cigarettes  . Smokeless tobacco: Never Used  . Tobacco comment: Thinks her smoking was more than 25 years abut is not exact   Substance and Sexual Activity  . Alcohol use: Yes    Alcohol/week: 0.6 oz    Types: 1 Glasses of wine per week    Comment: One drink a week.  . Drug use: No  . Sexual activity: Not on file  Lifestyle  . Physical activity:     Days per week: Not on file    Minutes per session: Not on file  . Stress: Not on file  Relationships  . Social connections:    Talks on phone: Not on file    Gets together: Not on file    Attends religious service: Not on file    Active member of club or organization: Not on file    Attends meetings of clubs or organizations: Not on file    Relationship status: Not on file  Other Topics Concern  . Not on file  Social History Narrative   Patient is married Gina Fritz) and lives at home with her husband.   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   Caffeine- Six cups of coffee daily and six cups of coke cola daily.          Vitals:   07/05/17 1524  BP: 112/70  Pulse: 91  Resp: 16  Temp: 98.5 F (36.9 C)  SpO2: 92%   Body mass index is 20.39 kg/m.   Physical Exam  Nursing note and vitals reviewed. Constitutional: She is oriented to person, place, and time. She appears well-developed and well-nourished. No distress.  HENT:  Head: Normocephalic and atraumatic.  Mouth/Throat: Oropharynx is clear and moist. Mucous membranes are dry.  Eyes: Pupils are equal, round, and reactive to light. Conjunctivae are normal.  Cardiovascular: Normal rate and regular rhythm.  No murmur heard. Pulses:      Dorsalis pedis pulses are 2+ on the right side, and 2+ on the left side.  Respiratory: Effort normal and breath sounds normal. No respiratory distress.  GI: Soft. She exhibits no mass. There is no hepatomegaly. There is no tenderness.  Musculoskeletal: She exhibits no edema.  Lymphadenopathy:    She has no cervical adenopathy.  Neurological: She is alert and oriented to person, place, and time. She has normal strength. Gait abnormal.  Unstable  gait assisted with a cane.  Skin: Skin is warm. No rash noted. No erythema.  Psychiatric: Her mood appears anxious.  Well groomed, good eye contact.    ASSESSMENT AND PLAN:  Ms.Jakalyn was seen today for  follow-up.  Diagnoses and all orders for this visit:  Lab Results  Component Value Date   CREATININE 1.23 (H) 07/05/2017   BUN 20 07/05/2017   NA 138 07/05/2017   K 4.3 07/05/2017   CL 101 07/05/2017   CO2 27 07/05/2017    Unstable gait  She has a walker at home, recommend using it. Fall precautions.  Essential hypertension, benign  Mild hypotension, recommend decreasing Cozaar from 100 mg daily to 50 mg daily. No changes in Amlodipine for now. Continue low-salt diet. Adequate hydration. Further recommendations will be given according to lab results. Follow-up in 2 weeks.   -     losartan (COZAAR) 50 MG tablet; Take 1 tablet (50 mg total) by mouth daily. -     Basic metabolic panel  Centrilobular emphysema (HCC)  COPD symptoms otherwise stable. Lung auscultation today negative for wheezing, rales, or rhonchi.  She prefers to hold on imaging today. No changes in current management. Continue following with pulmonologist. Monitor for warning signs.  Diarrhea, unspecified type  One episode today. Monitor for new symptoms. Adequate hydration.  Gastroesophageal reflux disease, esophagitis presence not specified  For now no changes in omeprazole and ranitidine. GERD precautions to continue.   Return in about 2 weeks (around 07/19/2017) for HTN.     -Ms.Kenyona Rena was advised to seek immediate medical attention if sudden worsening symptoms.      Betty G. Martinique, MD  [facility]. [place] office.

## 2017-07-05 NOTE — Patient Instructions (Signed)
A few things to remember from today's visit:   Essential hypertension, benign  Unstable gait  Centrilobular emphysema ([place])  Today we decreased dose of Cozaar from 100 mg to 50 mg. Adequate hydration. Use your walker for now. Fall prevention.  I will see you back in 2 weeks.  Please be sure medication list is accurate. If a new problem present, please set up appointment sooner than planned today.

## 2017-07-06 ENCOUNTER — Encounter: Payer: Self-pay | Admitting: Family Medicine

## 2017-07-06 LAB — BASIC METABOLIC PANEL
BUN: 20 mg/dL (ref 6–23)
CALCIUM: 9.8 mg/dL (ref 8.4–10.5)
CO2: 27 mEq/L (ref 19–32)
CREATININE: 1.23 mg/dL — AB (ref 0.40–1.20)
Chloride: 101 mEq/L (ref 96–112)
GFR: 45.12 mL/min — AB (ref >60.00)
GLUCOSE: 79 mg/dL (ref 70–99)
Potassium: 4.3 mEq/L (ref 3.5–5.1)
Sodium: 138 mEq/L (ref 135–145)

## 2017-07-07 ENCOUNTER — Encounter (INDEPENDENT_AMBULATORY_CARE_PROVIDER_SITE_OTHER): Payer: Medicare Other | Admitting: Ophthalmology

## 2017-07-07 DIAGNOSIS — I10 Essential (primary) hypertension: Secondary | ICD-10-CM | POA: Diagnosis not present

## 2017-07-07 DIAGNOSIS — H353231 Exudative age-related macular degeneration, bilateral, with active choroidal neovascularization: Secondary | ICD-10-CM | POA: Diagnosis not present

## 2017-07-07 DIAGNOSIS — H43813 Vitreous degeneration, bilateral: Secondary | ICD-10-CM | POA: Diagnosis not present

## 2017-07-07 DIAGNOSIS — H35033 Hypertensive retinopathy, bilateral: Secondary | ICD-10-CM | POA: Diagnosis not present

## 2017-07-16 ENCOUNTER — Other Ambulatory Visit: Payer: Self-pay | Admitting: Family Medicine

## 2017-07-16 DIAGNOSIS — K219 Gastro-esophageal reflux disease without esophagitis: Secondary | ICD-10-CM

## 2017-07-18 NOTE — Progress Notes (Signed)
HPI:   Gina Fritz is a 77 y.o. female, who is here with her husband today to follow on recent OV.   She was seen on 07/05/17. She was having worsening of unstable gait and not feeling well. Hypotension,so Losartan dose was decreased from 100 mg daily to 50 mg daily. She is also on Amlodipine 5 mg daily.  Lab Results  Component Value Date   CREATININE 1.23 (H) 07/05/2017   BUN 20 07/05/2017   NA 138 07/05/2017   K 4.3 07/05/2017   CL 101 07/05/2017   CO2 27 07/05/2017   She went back to Losartan 50 mg bid because elevated BP a few days after last OV. 187/70. She has been under a lot of stress due to health issues. She is following with urologist because right hydronephrosis, S/P right stent placement.  She is on Prednisone 50 mg daily, the plan is to complete 30 days of treatment.  She continue to have RLQ pain,constant,severe. She is on Tramadol. Pain is exacerbated by sitting on hard surface. Next appt with urologist on 08/15/17.     Review of Systems  Constitutional: Negative for activity change, appetite change, fatigue and fever.  HENT: Negative for mouth sores, nosebleeds and trouble swallowing.   Eyes: Negative for redness and visual disturbance.  Respiratory: Positive for cough, shortness of breath and wheezing.        COPD symptoms stable.  Cardiovascular: Negative for chest pain, palpitations and leg swelling.  Gastrointestinal: Positive for abdominal pain. Negative for nausea and vomiting.       Negative for changes in bowel habits.  Genitourinary: Negative for decreased urine volume and dysuria.  Musculoskeletal: Positive for gait problem.  Neurological: Negative for syncope, weakness and headaches.  Psychiatric/Behavioral: Negative for confusion. The patient is nervous/anxious.       Current Outpatient Medications on File Prior to Visit  Medication Sig Dispense Refill  . acetaminophen (TYLENOL) 325 MG tablet Take 2 tablets (650 mg  total) by mouth every 6 (six) hours as needed for mild pain (or Fever >/= 101). 30 tablet 1  . albuterol (PROAIR HFA) 108 (90 Base) MCG/ACT inhaler Inhale 2 puffs into the lungs every 6 (six) hours as needed for wheezing or shortness of breath. 1 Inhaler 2  . alendronate (FOSAMAX) 70 MG tablet Take 1 tablet (70 mg total) every Sunday by mouth. 13 tablet 3  . amLODipine (NORVASC) 5 MG tablet Take 1 tablet (5 mg total) by mouth daily. 90 tablet 1  . aspirin EC 81 MG tablet Take 81 mg by mouth daily.    Marland Kitchen atorvastatin (LIPITOR) 20 MG tablet TAKE 1 TABLET(20 MG) BY MOUTH DAILY 90 tablet 0  . belladona alk-PHENObarbital (DONNATAL) 16.2 MG tablet Take 1 tablet by mouth daily as needed (ibs). No more than 2 tabs per month. 15 tablet 1  . BESIVANCE 0.6 % SUSP Place 1 drop into both eyes See admin instructions. Takes the day before and the day of the dr visit 4 times a day    . Blood Pressure Monitoring (ADULT BLOOD PRESSURE CUFF LG) KIT 1 applicator by Miscellaneous route daily.    . Cholecalciferol (VITAMIN D) 2000 UNITS CAPS Take 2,000 Units by mouth daily.    Marland Kitchen escitalopram (LEXAPRO) 20 MG tablet TAKE 1 TABLET(20 MG) BY MOUTH DAILY 90 tablet 1  . fluticasone (FLONASE) 50 MCG/ACT nasal spray SHAKE LIQUID AND USE 2 SPRAYS IN EACH NOSTRIL DAILY 16 g 5  . Glycopyrrolate-Formoterol (  BEVESPI AEROSPHERE) 9-4.8 MCG/ACT AERO Inhale 2 puffs into the lungs 2 (two) times daily. 1 Inhaler 0  . Melatonin 5 MG TABS Take by mouth.    . montelukast (SINGULAIR) 10 MG tablet TAKE 1 TABLET(10 MG) BY MOUTH DAILY 90 tablet 1  . Multiple Vitamin (MULTIVITAMIN WITH MINERALS) TABS Take 1 tablet by mouth daily.    Marland Kitchen nystatin (MYCOSTATIN) 100000 UNIT/ML suspension SWISH AND SWALLOW 5 MILLITERS FOUR TIMES A DAY FOR 10 DAYS 473 mL 1  . omeprazole (PRILOSEC) 20 MG capsule TAKE ONE CAPSULE BY MOUTH EVERY DAY 90 capsule 0  . phenazopyridine (PYRIDIUM) 200 MG tablet Take 1 tablet (200 mg total) by mouth 3 (three) times daily as needed  for pain. 10 tablet 0  . polyethylene glycol (MIRALAX / GLYCOLAX) packet Take 17 g by mouth daily as needed for mild constipation.    . predniSONE (DELTASONE) 50 MG tablet TK 1 T PO QD  5  . ranitidine (ZANTAC) 300 MG tablet Take 1 tablet (300 mg total) at bedtime by mouth. 90 tablet 3  . tamsulosin (FLOMAX) 0.4 MG CAPS capsule Take by mouth.    . traMADol (ULTRAM) 50 MG tablet Take 1-2 tablets (50-100 mg total) by mouth every 6 (six) hours as needed for moderate pain. 15 tablet 0  . trimethoprim-polymyxin b (POLYTRIM) ophthalmic solution     . VESICARE 10 MG tablet TAKE 1 TABLET(10 MG) BY MOUTH DAILY 90 tablet 0  . vitamin B-12 (CYANOCOBALAMIN) 1000 MCG tablet Take 1,000 mcg by mouth daily.    Marland Kitchen Zoster Vaccine Adjuvanted [facility]) injection 0.5 ml in muscle and repeat in 8 weeks 0.5 mL 1   No current facility-administered medications on file prior to visit.      Past Medical History:  Diagnosis Date  . Allergic rhinitis   . Anxiety   . Aortic atherosclerosis ([place]) 04/21/2017   noted on CT Chest  . Arthritis    lower back  . Asthma   . Back pain   . CAP (community acquired pneumonia)    dx 02-05-2015  . Colitis    dx 02-05-2015  . COPD with emphysema ([place])   . Depression   . Dizziness   . GERD (gastroesophageal reflux disease)   . Headache    Migraines  . History of chronic sinusitis   . History of kidney stones   . Hyperlipidemia   . Hypertension   . Irritable bowel syndrome   . Loss of weight   . Macular degeneration, bilateral   . Oral thrush   . Pulmonary nodules    benign and stable per CT  . Right ureteral stone   . Sensorineural hearing loss, unilateral   . Subjective tinnitus    Allergies  Allergen Reactions  . Penicillins Anaphylaxis    Has patient had a PCN reaction causing immediate rash, facial/tongue/throat swelling, SOB or lightheadedness with hypotension: Yes Has patient had a PCN reaction causing severe rash involving mucus membranes or skin  necrosis: Yes Has patient had a PCN reaction that required hospitalization Yes Has patient had a PCN reaction occurring within the last 10 years: No If all of the above answers are "NO", then may proceed with Cephalosporin use.   . Sulfa Antibiotics Anaphylaxis and Hives  . Soy Allergy Itching and Rash    Social History   Socioeconomic History  . Marital status: Married    Spouse name: [name]  . Number of children: 3  . Years of education: college  .  Highest education level: Not on file  Occupational History    Comment: Retired  Scientific laboratory technician  . Financial resource strain: Not on file  . Food insecurity:    Worry: Not on file    Inability: Not on file  . Transportation needs:    Medical: Not on file    Non-medical: Not on file  Tobacco Use  . Smoking status: Current Some Day Smoker    Packs/day: 0.15    Years: 25.00    Pack years: 3.75    Types: Cigarettes  . Smokeless tobacco: Never Used  . Tobacco comment: Thinks her smoking was more than 25 years abut is not exact   Substance and Sexual Activity  . Alcohol use: Yes    Alcohol/week: 0.6 oz    Types: 1 Glasses of wine per week    Comment: One drink a week.  . Drug use: No  . Sexual activity: Not on file  Lifestyle  . Physical activity:    Days per week: Not on file    Minutes per session: Not on file  . Stress: Not on file  Relationships  . Social connections:    Talks on phone: Not on file    Gets together: Not on file    Attends religious service: Not on file    Active member of club or organization: Not on file    Attends meetings of clubs or organizations: Not on file    Relationship status: Not on file  Other Topics Concern  . Not on file  Social History Narrative   Patient is married [name]) and lives at home with her husband.   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   Caffeine- Six cups of coffee daily and six cups of coke cola daily.          Vitals:     07/19/17 1131  BP: 122/70  Pulse: 76  Resp: 16  Temp: 98.5 F (36.9 C)  SpO2: 96%   Body mass index is 20.51 kg/m.   Physical Exam  Nursing note and vitals reviewed. Constitutional: She is oriented to person, place, and time. She appears well-developed and well-nourished. No distress.  HENT:  Head: Normocephalic and atraumatic.  Mouth/Throat: Oropharynx is clear and moist and mucous membranes are normal. She has dentures.  Eyes: Conjunctivae are normal.  Cardiovascular: Normal rate and regular rhythm.  No murmur heard. Pulses:      Dorsalis pedis pulses are 2+ on the right side, and 2+ on the left side.  Respiratory: Effort normal and breath sounds normal. No respiratory distress.  GI: Soft. There is no hepatomegaly. There is no tenderness.  Musculoskeletal: She exhibits edema (Trace pitting LE edema, bilateral).  Lymphadenopathy:    She has no cervical adenopathy.  Neurological: She is alert and oriented to person, place, and time. She has normal strength. Coordination normal.  Skin: Skin is warm. No erythema.  Psychiatric: Her mood appears anxious.  Well groomed, good eye contact.    ASSESSMENT AND PLAN:  Gina Fritz was seen today for follow-up.   Orders Placed This Encounter  Procedures  . Basic metabolic panel   Lab Results  Component Value Date   CREATININE 0.88 07/19/2017   BUN 16 07/19/2017   NA 141 07/19/2017   K 3.6 07/19/2017   CL 102 07/19/2017   CO2 28 07/19/2017     Essential hypertension, benign Adequately controlled today, so she will continue current management.  Low salt diet to continue. F/U in 2 months.Continue monitoring BP at home.        Gina Brinker G. Martinique, MD  [facility]. [place] office.

## 2017-07-19 ENCOUNTER — Ambulatory Visit: Payer: Medicare Other | Admitting: Family Medicine

## 2017-07-19 ENCOUNTER — Encounter: Payer: Self-pay | Admitting: Family Medicine

## 2017-07-19 VITALS — BP 122/70 | HR 76 | Temp 98.5°F | Resp 16 | Ht 62.0 in | Wt 112.1 lb

## 2017-07-19 DIAGNOSIS — I10 Essential (primary) hypertension: Secondary | ICD-10-CM

## 2017-07-19 LAB — BASIC METABOLIC PANEL
BUN: 16 mg/dL (ref 6–23)
CHLORIDE: 102 meq/L (ref 96–112)
CO2: 28 mEq/L (ref 19–32)
CREATININE: 0.88 mg/dL (ref 0.40–1.20)
Calcium: 9.9 mg/dL (ref 8.4–10.5)
GFR: 66.39 mL/min (ref >60.00)
Glucose, Bld: 86 mg/dL (ref 70–99)
POTASSIUM: 3.6 meq/L (ref 3.5–5.1)
Sodium: 141 mEq/L (ref 135–145)

## 2017-07-19 MED ORDER — LOSARTAN POTASSIUM 50 MG PO TABS: 50.0000 mg | ORAL_TABLET | Freq: Two times a day (BID) | ORAL | 0 refills | Status: DC

## 2017-07-19 NOTE — Patient Instructions (Addendum)
A few things to remember from today's visit:   Essential hypertension, benign - Plan: Basic metabolic panel, losartan (COZAAR) 50 MG tablet  No changes today.  I will see you back in 2 months. Please be sure medication list is accurate. If a new problem present, please set up appointment sooner than planned today.

## 2017-07-19 NOTE — Assessment & Plan Note (Signed)
Adequately controlled today, so she will continue current management. Low salt diet to continue. F/U in 2 months.Continue monitoring BP at home.

## 2017-07-25 ENCOUNTER — Encounter (HOSPITAL_COMMUNITY): Payer: Self-pay | Admitting: *Deleted

## 2017-07-25 ENCOUNTER — Other Ambulatory Visit: Payer: Self-pay | Admitting: Urology

## 2017-07-26 ENCOUNTER — Other Ambulatory Visit: Payer: Self-pay | Admitting: Family Medicine

## 2017-07-26 DIAGNOSIS — J449 Chronic obstructive pulmonary disease, unspecified: Secondary | ICD-10-CM

## 2017-08-04 ENCOUNTER — Encounter (INDEPENDENT_AMBULATORY_CARE_PROVIDER_SITE_OTHER): Payer: Medicare Other | Admitting: Ophthalmology

## 2017-08-04 ENCOUNTER — Ambulatory Visit: Payer: Medicare Other | Admitting: Family Medicine

## 2017-08-04 ENCOUNTER — Encounter: Payer: Self-pay | Admitting: Family Medicine

## 2017-08-04 ENCOUNTER — Other Ambulatory Visit: Payer: Self-pay | Admitting: Family Medicine

## 2017-08-04 ENCOUNTER — Ambulatory Visit (INDEPENDENT_AMBULATORY_CARE_PROVIDER_SITE_OTHER)
Admission: RE | Admit: 2017-08-04 | Discharge: 2017-08-04 | Disposition: A | Discharge status: Home / Self Care | Payer: Medicare Other | Source: Ambulatory Visit | Attending: Family Medicine | Admitting: Family Medicine

## 2017-08-04 VITALS — BP 130/86 | HR 84 | Temp 99.1°F | Resp 16 | Ht 62.0 in | Wt 110.5 lb

## 2017-08-04 DIAGNOSIS — R600 Localized edema: Secondary | ICD-10-CM | POA: Diagnosis not present

## 2017-08-04 DIAGNOSIS — R58 Hemorrhage, not elsewhere classified: Secondary | ICD-10-CM

## 2017-08-04 DIAGNOSIS — S0991XA Unspecified injury of ear, initial encounter: Secondary | ICD-10-CM | POA: Diagnosis not present

## 2017-08-04 DIAGNOSIS — I10 Essential (primary) hypertension: Secondary | ICD-10-CM

## 2017-08-04 MED ORDER — IRBESARTAN 75 MG PO TABS: 75.0000 mg | ORAL_TABLET | Freq: Every day | ORAL | 1 refills | Status: DC

## 2017-08-04 NOTE — Progress Notes (Signed)
ACUTE VISIT   HPI:  Chief Complaint  Patient presents with  . Foot Swelling    left foot/ankle swelling noticed it on yesterday    [name] is a 77 y.o. female, who is here today with her husband complaining of left foot edema that she noted a few days ago, yesterday she started with left peri-ankle edema. She also noted a bruise on left foot, she is not having pain, she does not remember any trauma. She is not having pain and has not noted erythema. She has not identified exacerbating or alleviating factors. Edema seems constant and stable.  She is on Prednisone 50 mg daily.  She is having pre op evaluation and work-up this coming Monday and planning on urologic procedure Wednesday. She wants to be sure that everything is fine before undergoing procedure. She is having right ureter stent changed. Still having RLQ pain and gross hematuria.  -She is also concerned about blood in her left ear, for a few days every time she scratches ear canal with fingernail she sees small amount of dried blood.  No drainage, no hearing changes, and no earache. She has not had chills, fever, sore throat, or sick exposure.  Hypertension: Currently she is on losartan 50 mg twice daily and amlodipine 5 mg daily. Her husband is requesting to change losartan for a different medication, he is tired of frequent recalls even though her Losartan has not been affected. No chest pain, headache,or palpitations. 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  Lab Results  Component Value Date   CREATININE 0.88 07/19/2017   BUN 16 07/19/2017   NA 141 07/19/2017   K 3.6 07/19/2017   CL 102 07/19/2017   CO2 28 07/19/2017     Review of Systems  Constitutional: Positive for fatigue. Negative for activity change, appetite change and fever.  HENT: Negative for ear pain, facial swelling, mouth sores, nosebleeds, sore throat and trouble swallowing.   Eyes: Negative for redness and  visual disturbance.  Respiratory: Positive for cough and shortness of breath. Negative for wheezing.   Cardiovascular: Negative for chest pain, palpitations and leg swelling.  Gastrointestinal: Positive for abdominal pain. Negative for nausea and vomiting.       Negative for changes in bowel habits.  Genitourinary: Positive for hematuria. Negative for decreased urine volume and dysuria.  Musculoskeletal: Positive for gait problem.  Skin: Negative for rash and wound.  Neurological: Negative for syncope, weakness and headaches.  Psychiatric/Behavioral: Negative for confusion. The patient is nervous/anxious.       Current Outpatient Medications on File Prior to Visit  Medication Sig Dispense Refill  . acetaminophen (TYLENOL) 325 MG tablet Take 2 tablets (650 mg total) by mouth every 6 (six) hours as needed for mild pain (or Fever >/= 101). 30 tablet 1  . albuterol (PROAIR HFA) 108 (90 Base) MCG/ACT inhaler Inhale 2 puffs into the lungs every 6 (six) hours as needed for wheezing or shortness of breath. 1 Inhaler 2  . alendronate (FOSAMAX) 70 MG tablet Take 1 tablet (70 mg total) every Sunday by mouth. 13 tablet 3  . amLODipine (NORVASC) 5 MG tablet Take 1 tablet (5 mg total) by mouth daily. 90 tablet 1  . aspirin EC 81 MG tablet Take 81 mg by mouth daily.    Marland Kitchen atorvastatin (LIPITOR) 20 MG tablet TAKE 1 TABLET(20 MG) BY MOUTH DAILY 90 tablet 0  . belladona alk-PHENObarbital (DONNATAL) 16.2 MG tablet Take 1 tablet by mouth  daily as needed (ibs). No more than 2 tabs per month. 15 tablet 1  . BESIVANCE 0.6 % SUSP Place 1 drop into both eyes See admin instructions. Takes the day before and the day of the dr visit 4 times a day    . Blood Pressure Monitoring (ADULT BLOOD PRESSURE CUFF LG) KIT 1 applicator by Miscellaneous route daily.    . Cholecalciferol (VITAMIN D) 2000 UNITS CAPS Take 2,000 Units by mouth daily.    Marland Kitchen escitalopram (LEXAPRO) 20 MG tablet TAKE 1 TABLET(20 MG) BY MOUTH DAILY 90 tablet  1  . fluticasone (FLONASE) 50 MCG/ACT nasal spray SHAKE LIQUID AND USE 2 SPRAYS IN EACH NOSTRIL DAILY 16 g 5  . Glycopyrrolate-Formoterol (BEVESPI AEROSPHERE) 9-4.8 MCG/ACT AERO Inhale 2 puffs into the lungs 2 (two) times daily. 1 Inhaler 0  . losartan (COZAAR) 50 MG tablet Take 1 tablet (50 mg total) by mouth 2 (two) times daily. 180 tablet 0  . Melatonin 5 MG TABS Take by mouth.    . montelukast (SINGULAIR) 10 MG tablet TAKE 1 TABLET(10 MG) BY MOUTH DAILY 90 tablet 0  . Multiple Vitamin (MULTIVITAMIN WITH MINERALS) TABS Take 1 tablet by mouth daily.    Marland Kitchen nystatin (MYCOSTATIN) 100000 UNIT/ML suspension SWISH AND SWALLOW 5 MILLITERS FOUR TIMES A DAY FOR 10 DAYS 473 mL 1  . omeprazole (PRILOSEC) 20 MG capsule TAKE ONE CAPSULE BY MOUTH EVERY DAY 90 capsule 0  . phenazopyridine (PYRIDIUM) 200 MG tablet Take 1 tablet (200 mg total) by mouth 3 (three) times daily as needed for pain. 10 tablet 0  . polyethylene glycol (MIRALAX / GLYCOLAX) packet Take 17 g by mouth daily as needed for mild constipation.    . predniSONE (DELTASONE) 50 MG tablet TK 1 T PO QD  5  . ranitidine (ZANTAC) 300 MG tablet Take 1 tablet (300 mg total) at bedtime by mouth. 90 tablet 3  . tamsulosin (FLOMAX) 0.4 MG CAPS capsule Take by mouth.    . traMADol (ULTRAM) 50 MG tablet Take 1-2 tablets (50-100 mg total) by mouth every 6 (six) hours as needed for moderate pain. 15 tablet 0  . trimethoprim-polymyxin b (POLYTRIM) ophthalmic solution     . VESICARE 10 MG tablet TAKE 1 TABLET(10 MG) BY MOUTH DAILY 90 tablet 0  . vitamin B-12 (CYANOCOBALAMIN) 1000 MCG tablet Take 1,000 mcg by mouth daily.    Marland Kitchen Zoster Vaccine Adjuvanted [facility]) injection 0.5 ml in muscle and repeat in 8 weeks 0.5 mL 1   No current facility-administered medications on file prior to visit.      Past Medical History:  Diagnosis Date  . Allergic rhinitis   . Anxiety   . Aortic atherosclerosis ([place]) 04/21/2017   noted on CT Chest  . Arthritis    lower  back  . Asthma   . Back pain   . CAP (community acquired pneumonia)    dx 02-05-2015  . Colitis    dx 02-05-2015  . COPD with emphysema ([place])   . Depression   . Dizziness   . GERD (gastroesophageal reflux disease)   . Headache    Migraines  . History of chronic sinusitis   . History of kidney stones   . Hyperlipidemia   . Hypertension   . Irritable bowel syndrome   . Loss of weight   . Macular degeneration, bilateral   . Oral thrush   . Pulmonary nodules    benign and stable per CT  . Right ureteral stone   .  Sensorineural hearing loss, unilateral   . Subjective tinnitus    Allergies  Allergen Reactions  . Penicillins Anaphylaxis and Other (See Comments)    Has patient had a PCN reaction causing immediate rash, facial/tongue/throat swelling, SOB or lightheadedness with hypotension: Yes Has patient had a PCN reaction causing severe rash involving mucus membranes or skin necrosis: Yes Has patient had a PCN reaction that required hospitalization Yes Has patient had a PCN reaction occurring within the last 10 years: No If all of the above answers are "NO", then may proceed with Cephalosporin use.   . Sulfa Antibiotics Anaphylaxis and Hives  . Soy Allergy Itching and Rash    Social History   Socioeconomic History  . Marital status: Married    Spouse name: [name]  . Number of children: 3  . Years of education: college  . Highest education level: Not on file  Occupational History    Comment: Retired  Scientific laboratory technician  . Financial resource strain: Not on file  . Food insecurity:    Worry: Not on file    Inability: Not on file  . Transportation needs:    Medical: Not on file    Non-medical: Not on file  Tobacco Use  . Smoking status: Current Some Day Smoker    Packs/day: 0.15    Years: 25.00    Pack years: 3.75    Types: Cigarettes  . Smokeless tobacco: Never Used  . Tobacco comment: Thinks her smoking was more than 25 years abut is not exact   Substance and Sexual  Activity  . Alcohol use: Yes    Alcohol/week: 0.6 oz    Types: 1 Glasses of wine per week    Comment: One drink a week.  . Drug use: No  . Sexual activity: Not on file  Lifestyle  . Physical activity:    Days per week: Not on file    Minutes per session: Not on file  . Stress: Not on file  Relationships  . Social connections:    Talks on phone: Not on file    Gets together: Not on file    Attends religious service: Not on file    Active member of club or organization: Not on file    Attends meetings of clubs or organizations: Not on file    Relationship status: Not on file  Other Topics Concern  . Not on file  Social History Narrative   Patient is married [name]) and lives at home with her husband.   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   Caffeine- Six cups of coffee daily and six cups of coke cola daily.          Vitals:   08/04/17 0739  BP: 130/86  Pulse: 84  Resp: 16  Temp: 99.1 F (37.3 C)  SpO2: 94%   Body mass index is 20.21 kg/m.   Physical Exam  Nursing note and vitals reviewed. Constitutional: She is oriented to person, place, and time. She appears well-developed and well-nourished. No distress.  HENT:  Head: Normocephalic and atraumatic.  Right Ear: Tympanic membrane, external ear and ear canal normal.  Left Ear: Tympanic membrane and external ear normal.  Mouth/Throat: Oropharynx is clear and moist and mucous membranes are normal.  Small bloody crust on skin left ear canal. Mild tender with otoscopic examination. No edema or ear drainage.  Eyes: Conjunctivae are normal.  Neck: No tracheal deviation present. No thyromegaly present.  Cardiovascular:  Normal rate and regular rhythm.  No murmur heard. Pulses:      Dorsalis pedis pulses are 2+ on the right side, and 2+ on the left side.  Varicose veins LE,bilateral. No tenderness upon palpation of calves. Negative Homans' sign.  Respiratory: Effort  normal and breath sounds normal. No respiratory distress.  GI: Soft. She exhibits no mass. There is no hepatomegaly. There is no tenderness.  Musculoskeletal: She exhibits edema (Bilateral pitting LE edema, bilateral.Left pedal edema,no pitting.). She exhibits no tenderness.  Lymphadenopathy:    She has no cervical adenopathy.  Neurological: She is alert and oriented to person, place, and time. She has normal strength. Coordination normal.  Unstable gait assisted with a cane.  Skin: Skin is warm. Ecchymosis noted. No rash noted. No erythema.  Scattered around dorsum or left foot and some of her toes. No tenderness, "I can feel it."No deformity.  Psychiatric: Her mood appears anxious.  Well groomed, good eye contact.    ASSESSMENT AND PLAN:  Gina Fritz was seen today for foot swelling.  Orders Placed This Encounter  Procedures  . DG Foot Complete Left     Bilateral lower extremity edema  We discussed possible etiologies, ? Venous disease vs medications side effects. 07/19/17 normal BMP. LE elevation may help.  I do not think further studies are necessary at this time. She will monitor for worrisome signs.  Ecchymosis  It seems to be resolving, mild. Left foot imaging ordered, further recommendations will be given accordingly. No other ecchymotic lesion appreciated,so since she is having lab work in a couple days,I do not think CBC is needed today.   Trauma of ear canal, initial encounter  Minimal excoriation healing on ear canal most likely self inflicted when she scratches ear, so recommend avoid doing it. Also no Q tips.   Essential hypertension, benign Well-controlled. She is going to stop losartan and try Avapro 75 mg daily.  If BP > 140/90 in 5-7 days, she can increase dose to 2 tablets. No changes in amlodipine. Continue low-salt diet. Follow-up in 2 months.    Return in about 2 months (around 10/04/2017) for HTN.      Hobson Lax G. [place], MD  [facility] [place] office.

## 2017-08-04 NOTE — Progress Notes (Signed)
03-22-17 (Epic) CXR

## 2017-08-04 NOTE — Assessment & Plan Note (Signed)
Well-controlled. She is going to stop losartan and try Avapro 75 mg daily.  If BP > 140/90 in 5-7 days, she can increase dose to 2 tablets. No changes in amlodipine. Continue low-salt diet. Follow-up in 2 months.

## 2017-08-04 NOTE — Patient Instructions (Addendum)
Gina Fritz  08/04/2017   Your procedure is scheduled on: 08-09-17   Report to [facility] Main  Entrance    Report to admitting at 11:30 AM    Call this number if you have problems the morning of surgery [phone]   Remember: Do not eat food or drink liquids :After Midnight. You may have a Clear Liquid Diet from midnight until  7:30 AM. After 7:30 AM, nothing until after surgery.     CLEAR LIQUID DIET   Foods Allowed                                                                     Foods Excluded  Coffee and tea, regular and decaf                             liquids that you cannot  Plain Jell-O in any flavor                                             see through such as: Fruit ices (not with fruit pulp)                                     milk, soups, orange juice  Iced Popsicles                                    All solid food Carbonated beverages, regular and diet                                    Cranberry, grape and apple juices Sports drinks like Gatorade Lightly seasoned clear broth or consume(fat free) Sugar, honey syrup  Sample Menu Breakfast                                Lunch                                     Supper Cranberry juice                    Beef broth                            Chicken broth Jell-O                                     Grape juice  Apple juice Coffee or tea                        Jell-O                                      Popsicle                                                Coffee or tea                        Coffee or tea  _____________________________________________________________________    Take these medicines the morning of surgery with A SIP OF WATER: Amlodipine (Norvasc), Montelukast (Singulair), and Omeprazole (Prilosec).  You may bring and use your eyedrops and inhaler as needed.                                 You may not have any metal on your body  including hair pins and              piercings  Do not wear jewelry, make-up, lotions, powders or perfumes, deodorant             Men may shave face and neck.   Do not bring valuables to the hospital. Pinch.  Contacts, dentures or bridgework may not be worn into surgery.       Patients discharged the day of surgery will not be allowed to drive home.  Name and phone number of your driver: Cyndra Feinberg [phone]                Please read over the following fact sheets you were given: _____________________________________________________________________             [facility] - Preparing for Surgery Before surgery, you can play an important role.  Because skin is not sterile, your skin needs to be as free of germs as possible.  You can reduce the number of germs on your skin by washing with CHG (chlorahexidine gluconate) soap before surgery.  CHG is an antiseptic cleaner which kills germs and bonds with the skin to continue killing germs even after washing. Please DO NOT use if you have an allergy to CHG or antibacterial soaps.  If your skin becomes reddened/irritated stop using the CHG and inform your nurse when you arrive at Short Stay. Do not shave (including legs and underarms) for at least 48 hours prior to the first CHG shower.  You may shave your face/neck. Please follow these instructions carefully:  1.  Shower with CHG Soap the night before surgery and the  morning of Surgery.  2.  If you choose to wash your hair, wash your hair first as usual with your  normal  shampoo.  3.  After you shampoo, rinse your hair and body thoroughly to remove the  shampoo.                           4.  Use CHG as  you would any other liquid soap.  You can apply chg directly  to the skin and wash                       Gently with a scrungie or clean washcloth.  5.  Apply the CHG Soap to your body ONLY FROM THE NECK DOWN.   Do not use on face/  open                           Wound or open sores. Avoid contact with eyes, ears mouth and genitals (private parts).                       Wash face,  Genitals (private parts) with your normal soap.             6.  Wash thoroughly, paying special attention to the area where your surgery  will be performed.  7.  Thoroughly rinse your body with warm water from the neck down.  8.  DO NOT shower/wash with your normal soap after using and rinsing off  the CHG Soap.                9.  Pat yourself dry with a clean towel.            10.  Wear clean pajamas.            11.  Place clean sheets on your bed the night of your first shower and do not  sleep with pets. Day of Surgery : Do not apply any lotions/deodorants the morning of surgery.  Please wear clean clothes to the hospital/surgery center.  FAILURE TO FOLLOW THESE INSTRUCTIONS MAY RESULT IN THE CANCELLATION OF YOUR SURGERY PATIENT SIGNATURE_________________________________  NURSE SIGNATURE__________________________________  ________________________________________________________________________

## 2017-08-04 NOTE — Patient Instructions (Addendum)
A few things to remember from today's visit:   Bilateral lower extremity edema  Essential hypertension, benign - Plan: irbesartan (AVAPRO) 75 MG tablet  Ecchymosis - Plan: DG Foot Complete Left Small scratch in the left ear. Monitor blood pressure at home. I do not think leg edema is something concerning at this time, continue monitoring if pain and redness you need to be seen as soon as possible.   Today X ray was ordered.  This can be done at [facility] at [facility] between 8 am and 5 pm: [place]. [phone].    Please be sure medication list is accurate. If a new problem present, please set up appointment sooner than planned today.

## 2017-08-05 ENCOUNTER — Encounter: Payer: Self-pay | Admitting: Family Medicine

## 2017-08-07 ENCOUNTER — Other Ambulatory Visit: Payer: Self-pay

## 2017-08-07 ENCOUNTER — Encounter (HOSPITAL_COMMUNITY)
Admission: RE | Admit: 2017-08-07 | Discharge: 2017-08-07 | Disposition: A | Discharge status: Home / Self Care | Payer: Medicare Other | Source: Ambulatory Visit | Attending: Urology | Admitting: Urology

## 2017-08-07 ENCOUNTER — Encounter (HOSPITAL_COMMUNITY): Payer: Self-pay

## 2017-08-07 DIAGNOSIS — Z91018 Allergy to other foods: Secondary | ICD-10-CM | POA: Diagnosis not present

## 2017-08-07 DIAGNOSIS — I10 Essential (primary) hypertension: Secondary | ICD-10-CM | POA: Diagnosis not present

## 2017-08-07 DIAGNOSIS — K219 Gastro-esophageal reflux disease without esophagitis: Secondary | ICD-10-CM | POA: Diagnosis not present

## 2017-08-07 DIAGNOSIS — Z880 Allergy status to penicillin: Secondary | ICD-10-CM | POA: Diagnosis not present

## 2017-08-07 DIAGNOSIS — M479 Spondylosis, unspecified: Secondary | ICD-10-CM | POA: Diagnosis not present

## 2017-08-07 DIAGNOSIS — F17210 Nicotine dependence, cigarettes, uncomplicated: Secondary | ICD-10-CM | POA: Diagnosis not present

## 2017-08-07 DIAGNOSIS — N3281 Overactive bladder: Secondary | ICD-10-CM | POA: Diagnosis not present

## 2017-08-07 DIAGNOSIS — T83122A Displacement of urinary stent, initial encounter: Secondary | ICD-10-CM | POA: Diagnosis not present

## 2017-08-07 DIAGNOSIS — E785 Hyperlipidemia, unspecified: Secondary | ICD-10-CM | POA: Diagnosis not present

## 2017-08-07 DIAGNOSIS — Z6820 Body mass index (BMI) 20.0-20.9, adult: Secondary | ICD-10-CM | POA: Diagnosis not present

## 2017-08-07 DIAGNOSIS — H3530 Unspecified macular degeneration: Secondary | ICD-10-CM | POA: Diagnosis not present

## 2017-08-07 DIAGNOSIS — K589 Irritable bowel syndrome without diarrhea: Secondary | ICD-10-CM | POA: Diagnosis not present

## 2017-08-07 DIAGNOSIS — R600 Localized edema: Secondary | ICD-10-CM | POA: Diagnosis not present

## 2017-08-07 DIAGNOSIS — R072 Precordial pain: Secondary | ICD-10-CM | POA: Diagnosis not present

## 2017-08-07 DIAGNOSIS — Y838 Other surgical procedures as the cause of abnormal reaction of the patient, or of later complication, without mention of misadventure at the time of the procedure: Secondary | ICD-10-CM | POA: Diagnosis not present

## 2017-08-07 DIAGNOSIS — F419 Anxiety disorder, unspecified: Secondary | ICD-10-CM | POA: Diagnosis not present

## 2017-08-07 DIAGNOSIS — Z538 Procedure and treatment not carried out for other reasons: Secondary | ICD-10-CM | POA: Diagnosis not present

## 2017-08-07 DIAGNOSIS — Z882 Allergy status to sulfonamides status: Secondary | ICD-10-CM | POA: Diagnosis not present

## 2017-08-07 DIAGNOSIS — J189 Pneumonia, unspecified organism: Secondary | ICD-10-CM | POA: Diagnosis not present

## 2017-08-07 DIAGNOSIS — E440 Moderate protein-calorie malnutrition: Secondary | ICD-10-CM | POA: Diagnosis not present

## 2017-08-07 DIAGNOSIS — J440 Chronic obstructive pulmonary disease with acute lower respiratory infection: Secondary | ICD-10-CM | POA: Diagnosis not present

## 2017-08-07 DIAGNOSIS — I700 Atherosclerosis of aorta: Secondary | ICD-10-CM | POA: Diagnosis not present

## 2017-08-07 DIAGNOSIS — F329 Major depressive disorder, single episode, unspecified: Secondary | ICD-10-CM | POA: Diagnosis not present

## 2017-08-07 DIAGNOSIS — N131 Hydronephrosis with ureteral stricture, not elsewhere classified: Secondary | ICD-10-CM | POA: Diagnosis not present

## 2017-08-07 DIAGNOSIS — R3989 Other symptoms and signs involving the genitourinary system: Secondary | ICD-10-CM | POA: Diagnosis present

## 2017-08-07 DIAGNOSIS — Z87442 Personal history of urinary calculi: Secondary | ICD-10-CM | POA: Diagnosis not present

## 2017-08-07 LAB — CBC
HEMATOCRIT: 37.6 % (ref 36.0–46.0)
HEMOGLOBIN: 11.9 g/dL — AB (ref 12.0–15.0)
MCH: 29 pg (ref 26.0–34.0)
MCHC: 31.6 g/dL (ref 30.0–36.0)
MCV: 91.5 fL (ref 78.0–100.0)
Platelets: 281 10*3/uL (ref 150–400)
RBC: 4.11 MIL/uL (ref 3.87–5.11)
RDW: 15.3 % (ref 11.5–15.5)
WBC: 16.2 10*3/uL — AB (ref 4.0–10.5)

## 2017-08-07 LAB — BASIC METABOLIC PANEL
Anion gap: 11 (ref 5–15)
BUN: 27 mg/dL — ABNORMAL HIGH (ref 6–20)
CALCIUM: 9.9 mg/dL (ref 8.9–10.3)
CO2: 27 mmol/L (ref 22–32)
Chloride: 100 mmol/L — ABNORMAL LOW (ref 101–111)
Creatinine, Ser: 0.79 mg/dL (ref 0.44–1.00)
Glucose, Bld: 115 mg/dL — ABNORMAL HIGH (ref 65–99)
POTASSIUM: 3.9 mmol/L (ref 3.5–5.1)
SODIUM: 138 mmol/L (ref 135–145)

## 2017-08-07 NOTE — Progress Notes (Signed)
08-07-17 BMP result routed to Dr. Louis Meckel for review

## 2017-08-08 ENCOUNTER — Other Ambulatory Visit: Payer: Self-pay | Admitting: Family Medicine

## 2017-08-08 DIAGNOSIS — E785 Hyperlipidemia, unspecified: Secondary | ICD-10-CM

## 2017-08-09 ENCOUNTER — Encounter (HOSPITAL_COMMUNITY)
Admission: AD | Disposition: A | Discharge status: Home / Self Care | Payer: Self-pay | Source: Home / Self Care | Attending: Urology

## 2017-08-09 ENCOUNTER — Observation Stay (HOSPITAL_COMMUNITY): Payer: Medicare Other

## 2017-08-09 ENCOUNTER — Encounter (HOSPITAL_COMMUNITY): Payer: Self-pay | Admitting: *Deleted

## 2017-08-09 ENCOUNTER — Observation Stay (HOSPITAL_COMMUNITY): Payer: Medicare Other | Admitting: Anesthesiology

## 2017-08-09 ENCOUNTER — Inpatient Hospital Stay (HOSPITAL_COMMUNITY)
Admission: AD | Admit: 2017-08-09 | Discharge: 2017-08-11 | DRG: 190 | Disposition: A | Discharge status: Home / Self Care | Payer: Medicare Other | Attending: Urology | Admitting: Urology

## 2017-08-09 ENCOUNTER — Other Ambulatory Visit: Payer: Self-pay

## 2017-08-09 DIAGNOSIS — J189 Pneumonia, unspecified organism: Secondary | ICD-10-CM | POA: Diagnosis not present

## 2017-08-09 DIAGNOSIS — K219 Gastro-esophageal reflux disease without esophagitis: Secondary | ICD-10-CM | POA: Diagnosis present

## 2017-08-09 DIAGNOSIS — R509 Fever, unspecified: Secondary | ICD-10-CM

## 2017-08-09 DIAGNOSIS — N131 Hydronephrosis with ureteral stricture, not elsewhere classified: Secondary | ICD-10-CM | POA: Diagnosis present

## 2017-08-09 DIAGNOSIS — R072 Precordial pain: Secondary | ICD-10-CM | POA: Diagnosis not present

## 2017-08-09 DIAGNOSIS — E440 Moderate protein-calorie malnutrition: Secondary | ICD-10-CM | POA: Diagnosis not present

## 2017-08-09 DIAGNOSIS — Y838 Other surgical procedures as the cause of abnormal reaction of the patient, or of later complication, without mention of misadventure at the time of the procedure: Secondary | ICD-10-CM | POA: Diagnosis present

## 2017-08-09 DIAGNOSIS — T83122A Displacement of urinary stent, initial encounter: Secondary | ICD-10-CM | POA: Diagnosis present

## 2017-08-09 DIAGNOSIS — Z7951 Long term (current) use of inhaled steroids: Secondary | ICD-10-CM

## 2017-08-09 DIAGNOSIS — I2510 Atherosclerotic heart disease of native coronary artery without angina pectoris: Secondary | ICD-10-CM | POA: Diagnosis not present

## 2017-08-09 DIAGNOSIS — F17200 Nicotine dependence, unspecified, uncomplicated: Secondary | ICD-10-CM

## 2017-08-09 DIAGNOSIS — I700 Atherosclerosis of aorta: Secondary | ICD-10-CM | POA: Diagnosis present

## 2017-08-09 DIAGNOSIS — M7989 Other specified soft tissue disorders: Secondary | ICD-10-CM | POA: Diagnosis not present

## 2017-08-09 DIAGNOSIS — I10 Essential (primary) hypertension: Secondary | ICD-10-CM | POA: Diagnosis present

## 2017-08-09 DIAGNOSIS — F329 Major depressive disorder, single episode, unspecified: Secondary | ICD-10-CM | POA: Diagnosis present

## 2017-08-09 DIAGNOSIS — J432 Centrilobular emphysema: Secondary | ICD-10-CM | POA: Diagnosis not present

## 2017-08-09 DIAGNOSIS — Z882 Allergy status to sulfonamides status: Secondary | ICD-10-CM

## 2017-08-09 DIAGNOSIS — J439 Emphysema, unspecified: Secondary | ICD-10-CM | POA: Diagnosis present

## 2017-08-09 DIAGNOSIS — F419 Anxiety disorder, unspecified: Secondary | ICD-10-CM | POA: Diagnosis present

## 2017-08-09 DIAGNOSIS — R600 Localized edema: Secondary | ICD-10-CM | POA: Diagnosis present

## 2017-08-09 DIAGNOSIS — E785 Hyperlipidemia, unspecified: Secondary | ICD-10-CM | POA: Diagnosis present

## 2017-08-09 DIAGNOSIS — Z538 Procedure and treatment not carried out for other reasons: Secondary | ICD-10-CM | POA: Diagnosis not present

## 2017-08-09 DIAGNOSIS — J440 Chronic obstructive pulmonary disease with acute lower respiratory infection: Principal | ICD-10-CM | POA: Diagnosis present

## 2017-08-09 DIAGNOSIS — Z87442 Personal history of urinary calculi: Secondary | ICD-10-CM

## 2017-08-09 DIAGNOSIS — Z7952 Long term (current) use of systemic steroids: Secondary | ICD-10-CM

## 2017-08-09 DIAGNOSIS — N3281 Overactive bladder: Secondary | ICD-10-CM | POA: Diagnosis present

## 2017-08-09 DIAGNOSIS — T83122D Displacement of urinary stent, subsequent encounter: Secondary | ICD-10-CM

## 2017-08-09 DIAGNOSIS — M479 Spondylosis, unspecified: Secondary | ICD-10-CM | POA: Diagnosis present

## 2017-08-09 DIAGNOSIS — N1330 Unspecified hydronephrosis: Secondary | ICD-10-CM

## 2017-08-09 DIAGNOSIS — Z79899 Other long term (current) drug therapy: Secondary | ICD-10-CM

## 2017-08-09 DIAGNOSIS — Z880 Allergy status to penicillin: Secondary | ICD-10-CM

## 2017-08-09 DIAGNOSIS — K589 Irritable bowel syndrome without diarrhea: Secondary | ICD-10-CM | POA: Diagnosis present

## 2017-08-09 DIAGNOSIS — H3530 Unspecified macular degeneration: Secondary | ICD-10-CM | POA: Diagnosis present

## 2017-08-09 DIAGNOSIS — Z91018 Allergy to other foods: Secondary | ICD-10-CM

## 2017-08-09 DIAGNOSIS — R079 Chest pain, unspecified: Secondary | ICD-10-CM | POA: Diagnosis present

## 2017-08-09 DIAGNOSIS — Z6820 Body mass index (BMI) 20.0-20.9, adult: Secondary | ICD-10-CM

## 2017-08-09 DIAGNOSIS — F17210 Nicotine dependence, cigarettes, uncomplicated: Secondary | ICD-10-CM | POA: Diagnosis present

## 2017-08-09 HISTORY — DX: Localized edema: R60.0

## 2017-08-09 LAB — TROPONIN I
Troponin I: <0.03 ng/mL (ref <0.03)
Troponin I: <0.03 ng/mL (ref <0.03)

## 2017-08-09 LAB — HEMOGLOBIN: Hemoglobin: 11.4 g/dL — ABNORMAL LOW (ref 12.0–15.0)

## 2017-08-09 LAB — HEMATOCRIT: HCT: 36.2 % (ref 36.0–46.0)

## 2017-08-09 SURGERY — CYSTOSCOPY, WITH STENT INSERTION
Anesthesia: General | Laterality: Right

## 2017-08-09 MED ORDER — LOSARTAN POTASSIUM 50 MG PO TABS
50.0000 mg | ORAL_TABLET | Freq: Two times a day (BID) | ORAL | Status: DC
  Administered 2017-08-09: 50 mg via ORAL
  Filled 2017-08-09: qty 1

## 2017-08-09 MED ORDER — KETOROLAC TROMETHAMINE 15 MG/ML IJ SOLN
15.0000 mg | Freq: Four times a day (QID) | INTRAMUSCULAR | Status: DC | PRN
  Administered 2017-08-09: 15 mg via INTRAVENOUS
  Filled 2017-08-09 (×2): qty 1

## 2017-08-09 MED ORDER — MONTELUKAST SODIUM 10 MG PO TABS: 10.0000 mg | ORAL_TABLET | Freq: Every day | ORAL | Status: DC

## 2017-08-09 MED ORDER — POLYETHYLENE GLYCOL 3350 17 G PO PACK: 17.0000 g | PACK | Freq: Every day | ORAL | Status: DC | PRN

## 2017-08-09 MED ORDER — PANTOPRAZOLE SODIUM 40 MG PO TBEC
40.0000 mg | DELAYED_RELEASE_TABLET | Freq: Every day | ORAL | Status: DC
  Administered 2017-08-10: 40 mg via ORAL
  Filled 2017-08-09: qty 1

## 2017-08-09 MED ORDER — HYDROCODONE-ACETAMINOPHEN 5-325 MG PO TABS
1.0000 | ORAL_TABLET | ORAL | Status: DC | PRN
  Administered 2017-08-10 (×2): 1 via ORAL
  Filled 2017-08-09 (×2): qty 1

## 2017-08-09 MED ORDER — ESCITALOPRAM OXALATE 20 MG PO TABS
20.0000 mg | ORAL_TABLET | Freq: Every day | ORAL | Status: DC
  Administered 2017-08-09 – 2017-08-10 (×2): 20 mg via ORAL
  Filled 2017-08-09 (×2): qty 1

## 2017-08-09 MED ORDER — MONTELUKAST SODIUM 10 MG PO TABS
10.0000 mg | ORAL_TABLET | Freq: Every day | ORAL | Status: DC
  Administered 2017-08-10: 10 mg via ORAL
  Filled 2017-08-09: qty 1

## 2017-08-09 MED ORDER — LACTATED RINGERS IV SOLN
INTRAVENOUS | Status: DC
  Administered 2017-08-09 – 2017-08-10 (×3): via INTRAVENOUS

## 2017-08-09 MED ORDER — ONDANSETRON HCL 4 MG/2ML IJ SOLN: 4.0000 mg | Freq: Four times a day (QID) | INTRAMUSCULAR | Status: DC | PRN

## 2017-08-09 MED ORDER — UMECLIDINIUM-VILANTEROL 62.5-25 MCG/INH IN AEPB
1.0000 | INHALATION_SPRAY | Freq: Every day | RESPIRATORY_TRACT | Status: DC
  Administered 2017-08-10: 1 via RESPIRATORY_TRACT
  Filled 2017-08-09: qty 14

## 2017-08-09 MED ORDER — ONDANSETRON HCL 4 MG PO TABS: 4.0000 mg | ORAL_TABLET | Freq: Four times a day (QID) | ORAL | Status: DC | PRN

## 2017-08-09 MED ORDER — FLUTICASONE PROPIONATE 50 MCG/ACT NA SUSP
2.0000 | Freq: Every day | NASAL | Status: DC
  Administered 2017-08-10: 2 via NASAL
  Filled 2017-08-09 (×2): qty 16

## 2017-08-09 MED ORDER — BISACODYL 10 MG RE SUPP: 10.0000 mg | Freq: Every day | RECTAL | Status: DC | PRN

## 2017-08-09 MED ORDER — DOCUSATE SODIUM 100 MG PO CAPS
100.0000 mg | ORAL_CAPSULE | Freq: Two times a day (BID) | ORAL | Status: DC
  Administered 2017-08-09 – 2017-08-10 (×3): 100 mg via ORAL
  Filled 2017-08-09 (×3): qty 1

## 2017-08-09 MED ORDER — FAMOTIDINE 20 MG PO TABS
20.0000 mg | ORAL_TABLET | Freq: Every day | ORAL | Status: DC
  Administered 2017-08-09 – 2017-08-10 (×2): 20 mg via ORAL
  Filled 2017-08-09 (×2): qty 1

## 2017-08-09 MED ORDER — HYDROMORPHONE HCL 1 MG/ML IJ SOLN: 0.5000 mg | INTRAMUSCULAR | Status: DC | PRN

## 2017-08-09 MED ORDER — CIPROFLOXACIN IN D5W 400 MG/200ML IV SOLN: 400.0000 mg | Freq: Once | INTRAVENOUS | Status: DC

## 2017-08-09 MED ORDER — SODIUM CHLORIDE 0.9% FLUSH: 3.0000 mL | Freq: Two times a day (BID) | INTRAVENOUS | Status: DC

## 2017-08-09 MED ORDER — CIPROFLOXACIN IN D5W 400 MG/200ML IV SOLN
INTRAVENOUS | Status: AC
  Filled 2017-08-09: qty 200

## 2017-08-09 MED ORDER — LACTATED RINGERS IV SOLN
INTRAVENOUS | Status: DC
  Administered 2017-08-09: 12:00:00 via INTRAVENOUS

## 2017-08-09 MED ORDER — NON FORMULARY: 2.0000 | Freq: Two times a day (BID) | Status: DC

## 2017-08-09 MED ORDER — AMLODIPINE BESYLATE 5 MG PO TABS
5.0000 mg | ORAL_TABLET | Freq: Every day | ORAL | Status: DC
Start: 2017-08-10 — End: 2017-08-11
  Administered 2017-08-10 (×2): 5 mg via ORAL
  Filled 2017-08-09 (×2): qty 1

## 2017-08-09 MED ORDER — ALBUTEROL SULFATE (2.5 MG/3ML) 0.083% IN NEBU: 3.0000 mL | INHALATION_SOLUTION | Freq: Four times a day (QID) | RESPIRATORY_TRACT | Status: DC | PRN

## 2017-08-09 NOTE — Consult Note (Addendum)
Cardiology Consultation:   Patient ID: Gina Fritz; [id number]; 12/05/1940   Admit date: 08/09/2017 Date of Consult: 08/09/2017  Primary Care Provider: Martinique, Betty G, MD Primary Cardiologist: Dr Stanford Breed, 09/26/2016 Primary Electrophysiologist:  n/a   Patient Profile:   Gina Fritz is a 77 y.o. female with a hx of HTN, HLD, migraines w/ near-syncope, Ao atherosclerosis on CT 04/2017, asthma, IBS, GERD, OA, nephrolithiasis, COPD, pelvic mass near R ureter, tob use,  who is being seen today for the evaluation of CP, L>R LE edema at the request of Dr Louis Meckel.  History of Present Illness:   Ms. Gina Fritz had a right diagnostic ureteroscopy and retrograde pyelogram for right-sided hydroureteronephrosis. Ureteral stent was placed. CT showed a mass felt to be fibrotic process vs lymphoma near the R iliac vessels and R ureter.   Pt continued to have pain after the stent was placed, plans were made to change the stent. When pt arrived today for the procedure, she was noted to have an episode of chest pain and some LE edema. Cards to see.   Ms Gina Fritz was in her USOH this am. She was in preop and had sudden onset of chest pain. Not like her reflux, pressure. She began coughing, eventually got up some phlegm and then felt better. Entire episode lasted about 15 minutes.  She was normally very active, not exercising but staying busy around the house and yard. No chest pain w/ activity. SOB was stable.   After the stent, she was having a great deal of pain, she started smoking again (after 2 years) and her activity level has been greatly decreased for the last 6 months.   She did not normally have LE edema, but developed LLE edema last week. She also noted a blue discoloration, thought she had a bruise. Dr Martinique did an Xray, only arthritis seen. The R leg began swelling, but the L leg was clearly worse.    Past Medical History:  Diagnosis Date  . Allergic rhinitis   . Anxiety   .  Aortic atherosclerosis ([place]) 04/21/2017   noted on CT Chest  . Arthritis    lower back  . Asthma   . Back pain   . CAP (community acquired pneumonia)    dx 02-05-2015  . Colitis    dx 02-05-2015  . COPD with emphysema ([place])   . Depression   . Dizziness   . GERD (gastroesophageal reflux disease)   . Headache    Migraines  . History of chronic sinusitis   . History of kidney stones   . Hyperlipidemia   . Hypertension   . Irritable bowel syndrome   . Loss of weight   . Lower extremity edema, L>R 08/09/2017  . Macular degeneration, bilateral   . Oral thrush   . Pulmonary nodules    benign and stable per CT  . Right ureteral stone   . Sensorineural hearing loss, unilateral   . Subjective tinnitus     Past Surgical History:  Procedure Laterality Date  . CATARACT EXTRACTION Left   . CYSTO/  LEFT URETEROSCOPIC STONE EXTRACTION/ STENT PLACEMENT  08-10-2009  . CYSTOSCOPY WITH RETROGRADE PYELOGRAM, URETEROSCOPY AND STENT PLACEMENT Right 04/28/2017   Procedure: CYSTOSCOPY WITH BILATERAL RETROGRADE PYELOGRAM, RIGHT DIAGNOSTIC URETEROSCOPY,  BIOPSY AND STENT PLACEMENT;  Surgeon: Ardis Hughs, MD;  Location: [facility];  Service: Urology;  Laterality: Right;  . ECTOPIC PREGNANCY SURGERY     and Appendectomy  . NASAL SINUS SURGERY    .  REMOVAL CYST RIGHT ARM  age 20  . TUBAL LIGATION    . 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 Authorizing Provider  acetaminophen (TYLENOL) 325 MG tablet Take 2 tablets (650 mg total) by mouth every 6 (six) hours as needed for mild pain (or Fever >/= 101). 11/20/15  Yes Reyne Dumas, MD  albuterol (PROAIR HFA) 108 (90 Base) MCG/ACT inhaler Inhale 2 puffs into the lungs every 6 (six) hours as needed for wheezing or shortness of breath. 06/23/15  Yes Chesley Mires, MD  alendronate (FOSAMAX) 70 MG tablet Take 1 tablet (70 mg total) every Sunday by mouth. 02/19/17  Yes Martinique,  Betty G, MD  amLODipine (NORVASC) 5 MG tablet Take 1 tablet (5 mg total) by mouth daily. 10/19/16  Yes Martinique, Betty G, MD  atorvastatin (LIPITOR) 20 MG tablet TAKE 1 TABLET(20 MG) BY MOUTH DAILY 08/09/17  Yes Martinique, Betty G, MD  BESIVANCE 0.6 % SUSP Place 1 drop into both eyes See admin instructions. Takes the day before and the day of the dr visit 4 times a day 11/07/14  Yes [provider]  Cholecalciferol (VITAMIN D) 2000 UNITS CAPS Take 2,000 Units by mouth daily.   Yes [provider]  escitalopram (LEXAPRO) 20 MG tablet TAKE 1 TABLET(20 MG) BY MOUTH DAILY 06/23/17  Yes Martinique, Betty G, MD  fluticasone [facility]) 50 MCG/ACT nasal spray SHAKE LIQUID AND USE 2 SPRAYS IN EACH NOSTRIL DAILY 11/15/16  Yes Chesley Mires, MD  irbesartan (AVAPRO) 75 MG tablet TAKE 1 TABLET(75 MG) BY MOUTH DAILY 08/04/17  Yes Martinique, Betty G, MD  losartan (COZAAR) 50 MG tablet Take 1 tablet (50 mg total) by mouth 2 (two) times daily. 07/19/17  Yes Martinique, Betty G, MD  Melatonin 5 MG TABS Take by mouth.   Yes [provider]  montelukast (SINGULAIR) 10 MG tablet TAKE 1 TABLET(10 MG) BY MOUTH DAILY 07/26/17  Yes Martinique, Betty G, MD  Multiple Vitamin (MULTIVITAMIN WITH MINERALS) TABS Take 1 tablet by mouth daily.   Yes [provider]  nystatin (MYCOSTATIN) 100000 UNIT/ML suspension SWISH AND SWALLOW 5 MILLITERS FOUR TIMES A DAY FOR 10 DAYS 04/21/17  Yes Martinique, Betty G, MD  omeprazole (PRILOSEC) 20 MG capsule TAKE ONE CAPSULE BY MOUTH EVERY DAY 07/17/17  Yes Martinique, Betty G, MD  Oxycodone HCl 10 MG TABS Take 10-20 mg by mouth every 4 (four) hours as needed for pain. 08/04/17  Yes [provider]  phenazopyridine (PYRIDIUM) 200 MG tablet Take 1 tablet (200 mg total) by mouth 3 (three) times daily as needed for pain. 04/28/17  Yes Ardis Hughs, MD  polyethylene glycol [facility] Floria Raveling) packet Take 17 g by mouth daily as needed for mild constipation.   Yes [provider]    predniSONE (DELTASONE) 50 MG tablet TK 1 T PO QD 07/11/17  Yes [provider]  ranitidine (ZANTAC) 300 MG tablet Take 1 tablet (300 mg total) at bedtime by mouth. 02/14/17  Yes Martinique, Betty G, MD  tamsulosin (FLOMAX) 0.4 MG CAPS capsule Take by mouth.   Yes [provider]  traMADol (ULTRAM) 50 MG tablet Take 1-2 tablets (50-100 mg total) by mouth every 6 (six) hours as needed for moderate pain. 04/28/17  Yes Ardis Hughs, MD  trimethoprim-polymyxin b Mayra Neer) ophthalmic solution  06/09/17  Yes [provider]  VESICARE 10 MG tablet  TAKE 1 TABLET(10 MG) BY MOUTH DAILY 05/15/17  Yes Martinique, Betty G, MD  vitamin B-12 (CYANOCOBALAMIN) 1000 MCG tablet Take 1,000 mcg by mouth daily.   Yes [provider]  [place] alk-PHENObarbital (DONNATAL) 16.2 MG tablet Take 1 tablet by mouth daily as needed (ibs). No more than 2 tabs per month. 05/26/16   Martinique, Betty G, MD  Blood Pressure Monitoring (ADULT BLOOD PRESSURE CUFF LG) KIT 1 applicator by Miscellaneous route daily. 11/11/15   [provider]  Glycopyrrolate-Formoterol (BEVESPI AEROSPHERE) 9-4.8 MCG/ACT AERO Inhale 2 puffs into the lungs 2 (two) times daily. 03/22/17   Chesley Mires, MD  Zoster Vaccine Adjuvanted [facility]) injection 0.5 ml in muscle and repeat in 8 weeks 02/14/17   Martinique, Betty G, MD    Inpatient Medications: Scheduled Meds: . docusate sodium  100 mg Oral BID  . sodium chloride flush  3 mL Intravenous Q12H   Continuous Infusions: . ciprofloxacin    . lactated ringers 100 mL/hr at 08/09/17 1401   PRN Meds: bisacodyl, HYDROcodone-acetaminophen, HYDROmorphone (DILAUDID) injection, ketorolac, ondansetron **OR** ondansetron (ZOFRAN) IV, polyethylene glycol  Allergies:    Allergies  Allergen Reactions  . Penicillins Anaphylaxis and Other (See Comments)    Has patient had a PCN reaction causing immediate rash, facial/tongue/throat swelling, SOB or lightheadedness with hypotension:  Yes Has patient had a PCN reaction causing severe rash involving mucus membranes or skin necrosis: Yes Has patient had a PCN reaction that required hospitalization Yes Has patient had a PCN reaction occurring within the last 10 years: No If all of the above answers are "NO", then may proceed with Cephalosporin use.   . Sulfa Antibiotics Anaphylaxis and Hives  . Soy Allergy Itching and Rash    Social History:   Social History   Socioeconomic History  . Marital status: Married    Spouse name: [name]  . Number of children: 3  . Years of education: college  . Highest education level: Not on file  Occupational History    Comment: Retired  Scientific laboratory technician  . Financial resource strain: Not on file  . Food insecurity:    Worry: Not on file    Inability: Not on file  . Transportation needs:    Medical: Not on file    Non-medical: Not on file  Tobacco Use  . Smoking status: Current Every Day Smoker    Packs/day: 0.50    Years: 25.00    Pack years: 12.50    Types: Cigarettes  . Smokeless tobacco: Never Used  . Tobacco comment: Thinks her smoking was more than 25 years abut is not exact   Substance and Sexual Activity  . Alcohol use: Yes    Alcohol/week: 0.6 oz    Types: 1 Glasses of wine per week    Comment: One drink a week.  . Drug use: No  . Sexual activity: Not on file  Lifestyle  . Physical activity:    Days per week: Not on file    Minutes per session: Not on file  . Stress: Not on file  Relationships  . Social connections:    Talks on phone: Not on file    Gets together: Not on file    Attends religious service: Not on file    Active member of club or organization: Not on file    Attends meetings of clubs or organizations: Not on file    Relationship status: Not on file  . Intimate partner violence:    Fear of current  or ex partner: Not on file    Emotionally abused: Not on file    Physically abused: Not on file    Forced sexual activity: Not on file  Other  Topics Concern  . Not on file  Social History Narrative   Patient is married [name]) and lives at home with her husband.   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   Caffeine- Six cups of coffee daily and six cups of coke cola daily.          Family History:   Family History  Problem Relation Age of Onset  . Congestive Heart Failure Mother   . Hearing loss Mother   . Stroke Mother        syndrome  . Hypertension Father   . Other Unknown        thyroid disorder   Family Status:  Family Status  Relation Name Status  . Mother  Deceased at age 42  . Father  Deceased at age 55  . Unknown  (Not Specified)    ROS:  Please see the history of present illness.  All other ROS reviewed and negative.     Physical Exam/Data:   Vitals:   08/09/17 1101 08/09/17 1132 08/09/17 1331  BP: 131/74  (!) 146/73  Pulse: 71  66  Resp: 18  18  Temp: 98.2 F (36.8 C)  98.2 F (36.8 C)  TempSrc: Oral  Oral  SpO2: 93%  91%  Weight:  113 lb (51.3 kg)   Height:  5' 2"  (1.575 m)    No intake or output data in the 24 hours ending 08/09/17 1434 Filed Weights   08/09/17 1132  Weight: 113 lb (51.3 kg)   Body mass index is 20.67 kg/m.  General:  Well nourished, well developed, in no acute distress HEENT: normal Lymph: no adenopathy Neck: no JVD Endocrine:  No thryomegaly Vascular: No carotid bruits; 4/4 extremity pulses 2+, without bruits  Cardiac:  normal S1, S2; RRR; no sig murmur  Lungs: decreased BS bases bilaterally, ++ wheezing, no rhonchi or rales  Abd: soft, nontender, no hepatomegaly  Ext: R 3+, L 1+  LE edema Musculoskeletal:  No deformities, BUE and BLE strength normal and equal Skin: warm and dry  Neuro:  CNs 2-12 intact, no focal abnormalities noted Psych:  Normal affect   EKG:  The EKG was personally reviewed and demonstrates:  03/08, SR, HR 68, no acute ischemic changes Telemetry:  Telemetry was personally reviewed and  demonstrates:  SR  Relevant CV Studies:  ECHO: 11/11/2013 EF 62%, grade 1 dd PAS 46 Trace AI, mild MR  Laboratory Data:  Chemistry Recent Labs  Lab 08/07/17 0946  NA 138  K 3.9  CL 100*  CO2 27  GLUCOSE 115*  BUN 27*  CREATININE 0.79  CALCIUM 9.9  GFRNONAA >60  GFRAA >60  ANIONGAP 11    Lab Results  Component Value Date   ALT 15 10/19/2016   AST 20 10/19/2016   ALKPHOS 136 (H) 10/19/2016   BILITOT 0.5 10/19/2016   Hematology Recent Labs  Lab 08/07/17 0946 08/09/17 1416  WBC 16.2*  --   RBC 4.11  --   HGB 11.9* 11.4*  HCT 37.6 36.2  MCV 91.5  --   MCH 29.0  --   MCHC 31.6  --   RDW 15.3  --   PLT 281  --    Cardiac Enzymes pending   TSH:  Lab Results  Component Value Date   TSH 2.209 11/15/2015   Lipids: Lab Results  Component Value Date   CHOL 200 02/24/2016   HDL 46.10 02/24/2016   LDLCALC 121 (H) 02/24/2016   TRIG 162.0 (H) 02/24/2016   CHOLHDL 4 02/24/2016   HgbA1c: Lab Results  Component Value Date   HGBA1C 4.8 11/17/2015   Magnesium:  Magnesium  Date Value Ref Range Status  11/15/2015 1.9 1.7 - 2.4 mg/dL Final     Radiology/Studies:  LE Dopplers ordered  Assessment and Plan:   Principal Problem: 1.  Chest pain - sx are atypical but she has mult CRFs and coronary calcium on previous CT - sx resolved after sheassuring - no hx exertional sx coughed up some phlegm, no rx given - ECG is normal, re - She is at acceptable risk for the planned procedure without any further workup   Active Problems: 2.  Ureteral stent displacement ([place]) - per Dr Louis Meckel, see above  3.  COPD with emphysema ([place]) - pt w/ some wheezing now, not aware of it, O2 sats ok - restart Bevespi, Dr Halford Chessman prescribed that in the past - add prn albuterol - home rx - Singulair as at home - hopefully wheezing will improve prior to surgery  4.  Lower extremity edema, L>R - Dopplers ordered, no other S&S of volume overload. - f/u results, no anticoag till  after procedure  5. HTN - home BP meds ordered - pt had 2 ARBs on her list, family to clarify which one is correct, will order the losartan 50 mg bid for now, that is her most recent rx - continue amlodipine  For questions or updates, please contact [place] Please consult [email or web address] for contact info under Cardiology/STEMI.   Signed, Rosaria Ferries, PA-C  08/09/2017 2:34 PM   I have seen and examined the patient along with Rosaria Ferries, PA-C.  I have reviewed the chart, notes and new data.  I agree with NP's note.  Key new complaints: the episode of retrosternal discomfort occurred at rest and has never happened before.  A few months ago she was performing yardwork and housework without any difficulty whatsoever.  She has numerous coronary risk factors including smoking (recently restarted).  Leg swelling began after treatment with prednisone, but is clearly asymmetrical. Key examination changes: 3+ pitting edema (very soft) almost to the knee on the left side, 1+ pitting edema pretibially on the right.  Bilateral wheezing.  Otherwise normal cardiovascular exam. Key new findings / data: Review of previous chest CT without contrast shows moderate amount of coronary artery calcification as well as some calcification in the aortic arch.  I am not sure whether this is quantitatively more than would be expected for the average 78 year old woman.  Important to note that she has a retroesophageal right subclavian artery and it would be potentially very difficult to perform cardiac catheterization via the right radial approach, should we decide that invasive evaluation is necessary.  PLAN: Mrs. Baglio had a 15-minute episode of rather nondescript chest discomfort at rest, with normal ECG and she has asymmetrical leg edema.  It is important to exclude DVT and PE. While it is quite possible that she has coronary artery disease, she does not have any high risk features.  She is scheduled for a  low risk urological procedure.  Unless her cardiac enzymes are abnormal, I do not think that she needs additional cardiac evaluation before her surgery, although it would be appropriate to refer  her for a functional study [facility]) as an outpatient. If the work-up for venous thromboembolic disease in the cardiac troponins are normal, I think she is at low risk for major cardiovascular complications with the planned ureteral stent, tomorrow.  Sanda Klein, MD, [place] [phone] 08/09/2017, 3:29 PM

## 2017-08-09 NOTE — Progress Notes (Signed)
Report called to [place].  Reason for admission: Pt c/o chest pain in pre-op when Dr. Louis Meckel in to see pt.  Dr. Louis Meckel also noted bilat.lower extremity edema.  Left worse than right.  +2 pitting edema.  Dr. Louis Meckel wants cardiac workup and rule out DVT to lower extremity before he proceeds with surgery (ureteral stent exchange).  Pt taken to room via stretcher.  Family followed behind.  Pt placed in room bed, 1440.  Call bell placed within reach.  Family is at beside.  Informed nurse Threasa Alpha pt had arrived, she voiced understanding.  Paperwork given to Network engineer at desk.

## 2017-08-09 NOTE — Anesthesia Preprocedure Evaluation (Signed)
Anesthesia Evaluation    Airway        Dental   Pulmonary asthma , pneumonia, COPD, Current Smoker,           Cardiovascular hypertension, + Peripheral Vascular Disease       Neuro/Psych  Headaches,    GI/Hepatic GERD  ,  Endo/Other    Renal/GU Renal disease     Musculoskeletal  (+) Arthritis ,   Abdominal   Peds  Hematology   Anesthesia Other Findings   Reproductive/Obstetrics                             Anesthesia Physical Anesthesia Plan  ASA: III  Anesthesia Plan: General   Post-op Pain Management:    Induction: Intravenous  PONV Risk Score and Plan: Treatment may vary due to age or medical condition, Ondansetron, Dexamethasone and Midazolam  Airway Management Planned:   Additional Equipment:   Intra-op Plan:   Post-operative Plan: Extubation in OR  Informed Consent: I have reviewed the patients History and Physical, chart, labs and discussed the procedure including the risks, benefits and alternatives for the proposed anesthesia with the patient or authorized representative who has indicated his/her understanding and acceptance.   Dental advisory given  Plan Discussed with: CRNA and Anesthesiologist  Anesthesia Plan Comments:         Anesthesia Quick Evaluation

## 2017-08-09 NOTE — H&P (Signed)
The patient presents today for follow-up after undergoing right diagnostic ureteroscopy and retrograde pyelogram for right-sided hydroureteronephrosis. A CT scan demonstrates an enhancing mass in the right pelvis overlying the iliac vessels and teasing the right ureter. Diagnostic ureteroscopy demonstrated that this mass was extraluminal. Stent was placed. Biopsies and cytology were negative.   The patient was then presented at tumor board and the impression was that this may be a benign fibrotic process versus a lymphoma. Recommendation amongst the group was to repeat her CT scans in several months.   07/10/17: The patient is seen today 3 months after her initial CT scan and stent placement. She had a repeat CT scan demonstrated persistent lesion over the right iliac vessels, but similar in size and less discrete than previous imaging. There is no additional lymphadenopathy or other significant abnormalities. The patient has had some weakness and some overactive bladder symptoms. She's also had at times difficulty emptying her bladder. She was recently seen by her primary care provider and diagnosed with the Bartholin's gland cyst.   07/19/17: The patient was seen by the nurse practitioner for bladder pain. She was treated for constipation and given some tramadol for her discomfort.   Interval: The patient presents today for ongoing pain.   07/24/17: PT advises that the urgency has stopped, however she is still suffering from bladder pain (10) staying local to bladder. Pt feels as if she is emptying well, she has increased her water intake. Pt advises that the pain began after Stent was placed, wondering if stent is missed placed or moving. Blood in urine ( dark red) , no blood in stool and have been moving them normally. Pt appetite is up and down.     ALLERGIES: Penicillins - Anaphylaxis Soy Protein - Skin Rash, Swelling Sulfa Drugs - Skin Rash, Swelling    MEDICATIONS: Phenazopyridine Hcl 200 mg  tablet 1 tablet PO TID  Phenazopyridine Hcl 200 mg tablet 1 tablet PO TID  Phenazopyridine Hcl 200 mg tablet 1 tablet PO TID  Acetaminophen 500 mg tablet 2 tablet PO Q 4 H PRN  Buspirone Hcl  Flomax 0.4 mg capsule Oral  Flonase 50 mcg/actuation spray, suspension Nasal  Hydrochlorothiazide 25 mg tablet Oral  Lexapro 20 MG Oral Tablet Oral  Lipitor 40 mg tablet Oral  Losartan Potassium 50 mg tablet Oral  Nasacort AQ 55 MCG/ACT AERS Nasal  Prednisone 50 mg tablet 1 tablet PO Daily  Ranitidine Hcl 300 mg tablet Oral  Singulair 10 MG Oral Tablet Oral  Tramadol Hcl 50 mg tablet 1 tablet PO Q 8 H PRN  Vitamin D3 2,000 unit tablet Oral     GU PSH: Bladder Instill AntiCA Agent - 07/11/2017 Cysto Uretero Biopsy Fulgura, Right - 04/28/2017 Cystoscopy - 04/06/2017 Cystoscopy Insert Stent, Right - 04/28/2017, 2011 Locm 300-399Mg /Ml Iodine,1Ml - 07/07/2017, 04/13/2017 Ureteroscopic stone removal - 2011    NON-GU PSH: Cataract surgery Nose Surgery (Unspecified) Treat Ectopic Pregnancy - 2011    GU PMH: Flank Pain (Worsening, Chronic), Right, Culture urine. No ABX unless culture proven UTI. Discussed with slight increase in renal function to avoid NSAID for pain. Tramadol 50 mg 1 po Q8 hrs prn. Instructed to resolve constipation with daily Miralax. She can also use Mag Citrate PRN. 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 Hydronephrosis - 05/15/2017 Gross hematuria - 04/06/2017, Gross hematuria, - 2016 Renal calculus, Nephrolithiasis - 2017 Ureteral calculus, Calculus of ureter - 03/16/2015 Urinary Frequency, Increased urinary frequency - 03/02/2015 Pelvic/perineal pain, Female pelvic pain - 02/03/2015  Abnormal uterine and vaginal bleeding, unspecified, Abnormal vaginal bleeding - 01/28/2015    NON-GU PMH: Encounter for general adult medical examination without abnormal findings, Encounter for preventive health examination - 2017 Other lack of coordination, Muscular incoordination - 02/03/2015 Other  muscle spasm, Muscle spasm - 02/03/2015 Anxiety, Anxiety (Symptom) - 2014 Personal history of other diseases of the circulatory system, History of hypertension - 2014 Personal history of other endocrine, nutritional and metabolic disease, History of hypercholesterolemia - 2014 Personal history of other mental and behavioral disorders, History of depression - 2014 Personal history of other specified conditions, History of heartburn - 2014    FAMILY HISTORY: Depression - Father Family Health Status Number - Runs In Family Father Deceased At Age18 ___ - Runs In Family Hypertension - Mother Mother Deceased At Age 39 from diabetic complicati - Runs In Family Stroke Syndrome - Mother Strokes - Runs In Family Suicide Completion - Mother   SOCIAL HISTORY: Marital Status: Married Preferred Language: English; Ethnicity: Not Hispanic Or Latino; Race: White Current Smoking Status: Patient smokes.   Tobacco Use Assessment Completed: Used Tobacco in last 30 days? Does not use smokeless tobacco. Does not drink anymore.  Does not use drugs. Drinks 1 caffeinated drink per day.    REVIEW OF SYSTEMS:    GU Review Female:   Patient reports frequent urination. Patient denies hard to postpone urination, burning /pain with urination, get up at night to urinate, leakage of urine, stream starts and stops, trouble starting your stream, have to strain to urinate, and being pregnant.  Gastrointestinal (Upper):   Patient denies nausea, vomiting, and indigestion/ heartburn.  Gastrointestinal (Lower):   Patient denies diarrhea and constipation.  Constitutional:   Patient denies fever, fatigue, weight loss, and night sweats.  Skin:   Patient denies skin rash/ lesion and itching.  Eyes:   Patient reports blurred vision. Patient denies double vision.  Ears/ Nose/ Throat:   Patient denies sore throat and sinus problems.  Hematologic/Lymphatic:   Patient denies swollen glands and easy bruising.  Cardiovascular:    Patient denies leg swelling and chest pains.  Respiratory:   Patient denies cough and shortness of breath.  Endocrine:   Patient denies excessive thirst.  Musculoskeletal:   Patient denies back pain and joint pain.  Neurological:   Patient denies headaches and dizziness.  Psychologic:   Patient denies depression and anxiety.   VITAL SIGNS:      07/24/2017 01:53 PM  Weight 115 lb / 52.16 kg  Height 62 in / 157.48 cm  Heart Rate 69 /min  Temperature 98.5 F / 36.9 C  BMI 21.0 kg/m   MULTI-SYSTEM PHYSICAL EXAMINATION:    Constitutional: Well-nourished. No physical deformities. Normally developed. Good grooming.  Respiratory: No labored breathing, no use of accessory muscles. Normal breath sounds.  Cardiovascular: Regular rate and rhythm. No murmur, no gallop. Normal temperature, normal extremity pulses, no swelling, no varicosities.  Gastrointestinal: No mass, no tenderness, no rigidity, non obese abdomen.     PAST DATA REVIEWED:  Source Of History:  Patient  Records Review:   Previous Doctor Records, Previous Patient Records, POC Tool  X-Ray Review: Renal Ultrasound: Reviewed Films.  C.T. Abdomen/Pelvis: Reviewed Films. Discussed With Patient.     PROCEDURES:          Urinalysis Dipstick Dipstick Cont'd Micro  Color: Red Bilirubin: Neg WBC/hpf: 10 - 20/hpf  Appearance: Turbid Ketones: Trace RBC/hpf: >60/hpf  Specific Gravity: 1.025 Blood: 3+ Bacteria: Rare (0-9/hpf)  pH: 6.0 Protein: 3+ Cystals:  NS (Not Seen)  Glucose: Neg Urobilinogen: 2.0 Casts: NS (Not Seen)    Nitrites: Positive Trichomonas: Not Present    Leukocyte Esterase: 3+ Mucous: Present      Epithelial Cells: NS (Not Seen)      Yeast: NS (Not Seen)      Sperm: Not Present    Notes: Microscopic done on unconcentrated urine    ASSESSMENT:      ICD-10 Details  1 GU:   Hydronephrosis - N13.0   2   Pelvic/perineal pain - R10.2    PLAN:            Medications New Meds: Valium 10 mg tablet 1 tablet Per  Vagina Q HS   #30  2 Refill(s)            Orders Labs Urinalysis          Schedule Return Visit/Planned Activity: ASAP - PT/OT Referral          Document Letter(s):  Created for Patient: Clinical Summary         Notes:   Our plan is to see if we can get the patient some pain relief through pelvic floor physical therapy. I've also given her intravaginal Valium to see if this failed to relax some of her pain. I would also like to get that stent exchanged sooner than later, I've given the patient a booking sheet to get this scheduled at her convenience. The patient will continue to take the prednisone 50 mg daily. She is tentatively scheduled to take this for 6 months. I send the patient's urine for culture, we will follow up these results and treat her if necessary.

## 2017-08-09 NOTE — Progress Notes (Signed)
Surgery canceled today for ureteral stent exchange, because patient complaining of chest pain and has left lower leg swelling.  Have asked Cardiology to see patient and ordered a lower extremity duplex.  Will try and add patient to schedule tomorrow if cleared from above issues.

## 2017-08-09 NOTE — Progress Notes (Signed)
Bilateral lower extremity venous duplex completed. Preliminary results - No obvious evidence of DVT, superficial thrombosis, or Baker's cyst. Toma Copier, RVS 08/09/2017 7:47 PM

## 2017-08-10 ENCOUNTER — Encounter (HOSPITAL_COMMUNITY)
Admission: AD | Disposition: A | Discharge status: Home / Self Care | Payer: Self-pay | Source: Home / Self Care | Attending: Urology

## 2017-08-10 ENCOUNTER — Observation Stay (HOSPITAL_COMMUNITY): Payer: Medicare Other

## 2017-08-10 ENCOUNTER — Encounter (HOSPITAL_COMMUNITY): Payer: Self-pay | Admitting: Anesthesiology

## 2017-08-10 ENCOUNTER — Inpatient Hospital Stay (HOSPITAL_COMMUNITY): Payer: Medicare Other

## 2017-08-10 ENCOUNTER — Inpatient Hospital Stay (HOSPITAL_COMMUNITY): Admission: RE | Admit: 2017-08-10 | Payer: Medicare Other | Source: Ambulatory Visit | Admitting: Urology

## 2017-08-10 DIAGNOSIS — Y838 Other surgical procedures as the cause of abnormal reaction of the patient, or of later complication, without mention of misadventure at the time of the procedure: Secondary | ICD-10-CM | POA: Diagnosis not present

## 2017-08-10 DIAGNOSIS — R072 Precordial pain: Secondary | ICD-10-CM | POA: Diagnosis not present

## 2017-08-10 DIAGNOSIS — K589 Irritable bowel syndrome without diarrhea: Secondary | ICD-10-CM | POA: Diagnosis not present

## 2017-08-10 DIAGNOSIS — I5030 Unspecified diastolic (congestive) heart failure: Secondary | ICD-10-CM

## 2017-08-10 DIAGNOSIS — I10 Essential (primary) hypertension: Secondary | ICD-10-CM | POA: Diagnosis not present

## 2017-08-10 DIAGNOSIS — Z6820 Body mass index (BMI) 20.0-20.9, adult: Secondary | ICD-10-CM | POA: Diagnosis not present

## 2017-08-10 DIAGNOSIS — N131 Hydronephrosis with ureteral stricture, not elsewhere classified: Secondary | ICD-10-CM | POA: Diagnosis not present

## 2017-08-10 DIAGNOSIS — E440 Moderate protein-calorie malnutrition: Secondary | ICD-10-CM | POA: Diagnosis not present

## 2017-08-10 DIAGNOSIS — J432 Centrilobular emphysema: Secondary | ICD-10-CM | POA: Diagnosis not present

## 2017-08-10 DIAGNOSIS — N3281 Overactive bladder: Secondary | ICD-10-CM | POA: Diagnosis not present

## 2017-08-10 DIAGNOSIS — K219 Gastro-esophageal reflux disease without esophagitis: Secondary | ICD-10-CM | POA: Diagnosis not present

## 2017-08-10 DIAGNOSIS — R3989 Other symptoms and signs involving the genitourinary system: Secondary | ICD-10-CM | POA: Diagnosis present

## 2017-08-10 DIAGNOSIS — H3530 Unspecified macular degeneration: Secondary | ICD-10-CM | POA: Diagnosis not present

## 2017-08-10 DIAGNOSIS — M479 Spondylosis, unspecified: Secondary | ICD-10-CM | POA: Diagnosis not present

## 2017-08-10 DIAGNOSIS — J440 Chronic obstructive pulmonary disease with acute lower respiratory infection: Secondary | ICD-10-CM | POA: Diagnosis not present

## 2017-08-10 DIAGNOSIS — I700 Atherosclerosis of aorta: Secondary | ICD-10-CM | POA: Diagnosis not present

## 2017-08-10 DIAGNOSIS — E785 Hyperlipidemia, unspecified: Secondary | ICD-10-CM | POA: Diagnosis not present

## 2017-08-10 DIAGNOSIS — R600 Localized edema: Secondary | ICD-10-CM | POA: Diagnosis not present

## 2017-08-10 DIAGNOSIS — T83122A Displacement of urinary stent, initial encounter: Secondary | ICD-10-CM | POA: Diagnosis not present

## 2017-08-10 DIAGNOSIS — J189 Pneumonia, unspecified organism: Secondary | ICD-10-CM | POA: Diagnosis not present

## 2017-08-10 DIAGNOSIS — Z538 Procedure and treatment not carried out for other reasons: Secondary | ICD-10-CM | POA: Diagnosis not present

## 2017-08-10 DIAGNOSIS — F419 Anxiety disorder, unspecified: Secondary | ICD-10-CM | POA: Diagnosis not present

## 2017-08-10 LAB — CBC
HCT: 37.1 % (ref 36.0–46.0)
HEMOGLOBIN: 11.8 g/dL — AB (ref 12.0–15.0)
MCH: 28.4 pg (ref 26.0–34.0)
MCHC: 31.8 g/dL (ref 30.0–36.0)
MCV: 89.2 fL (ref 78.0–100.0)
PLATELETS: 227 10*3/uL (ref 150–400)
RBC: 4.16 MIL/uL (ref 3.87–5.11)
RDW: 15.2 % (ref 11.5–15.5)
WBC: 9.1 10*3/uL (ref 4.0–10.5)

## 2017-08-10 LAB — BASIC METABOLIC PANEL
ANION GAP: 11 (ref 5–15)
BUN: 17 mg/dL (ref 6–20)
CALCIUM: 8.8 mg/dL — AB (ref 8.9–10.3)
CO2: 28 mmol/L (ref 22–32)
Chloride: 98 mmol/L — ABNORMAL LOW (ref 101–111)
Creatinine, Ser: 0.65 mg/dL (ref 0.44–1.00)
GFR calc Af Amer: >60 mL/min (ref >60)
GFR calc non Af Amer: >60 mL/min (ref >60)
GLUCOSE: 88 mg/dL (ref 65–99)
Potassium: 3.2 mmol/L — ABNORMAL LOW (ref 3.5–5.1)
Sodium: 137 mmol/L (ref 135–145)

## 2017-08-10 LAB — URINALYSIS, ROUTINE W REFLEX MICROSCOPIC
Bilirubin Urine: NEGATIVE
GLUCOSE, UA: NEGATIVE mg/dL
KETONES UR: NEGATIVE mg/dL
NITRITE: NEGATIVE
PROTEIN: NEGATIVE mg/dL
RBC / HPF: >50 RBC/hpf — ABNORMAL HIGH (ref 0–5)
Specific Gravity, Urine: 1.004 — ABNORMAL LOW (ref 1.005–1.030)
pH: 8 (ref 5.0–8.0)

## 2017-08-10 LAB — TROPONIN I: Troponin I: 0.03 ng/mL (ref <0.03)

## 2017-08-10 LAB — ECHOCARDIOGRAM COMPLETE
Height: 62 in
Weight: 1808 oz

## 2017-08-10 LAB — GLUCOSE, CAPILLARY
GLUCOSE-CAPILLARY: 134 mg/dL — AB (ref 65–99)
Glucose-Capillary: 100 mg/dL — ABNORMAL HIGH (ref 65–99)
Glucose-Capillary: 98 mg/dL (ref 65–99)
Glucose-Capillary: 99 mg/dL (ref 65–99)

## 2017-08-10 SURGERY — CYSTOSCOPY, WITH RETROGRADE PYELOGRAM AND URETERAL STENT INSERTION
Anesthesia: General | Laterality: Right

## 2017-08-10 MED ORDER — PROPOFOL 10 MG/ML IV BOLUS
INTRAVENOUS | Status: AC
  Filled 2017-08-10: qty 40

## 2017-08-10 MED ORDER — EPHEDRINE 5 MG/ML INJ
INTRAVENOUS | Status: AC
  Filled 2017-08-10: qty 10

## 2017-08-10 MED ORDER — SODIUM CHLORIDE 0.45 % IV SOLN
INTRAVENOUS | Status: DC
  Administered 2017-08-10 – 2017-08-11 (×2): via INTRAVENOUS
  Filled 2017-08-10 (×3): qty 1000

## 2017-08-10 MED ORDER — IRBESARTAN 150 MG PO TABS
75.0000 mg | ORAL_TABLET | Freq: Every day | ORAL | Status: DC
  Administered 2017-08-10: 75 mg via ORAL
  Filled 2017-08-10: qty 1

## 2017-08-10 MED ORDER — SODIUM CHLORIDE 0.45 % IV SOLN: INTRAVENOUS | Status: DC

## 2017-08-10 MED ORDER — INSULIN ASPART 100 UNIT/ML ~~LOC~~ SOLN: 0.0000 [IU] | SUBCUTANEOUS | Status: DC

## 2017-08-10 MED ORDER — LIDOCAINE 2% (20 MG/ML) 5 ML SYRINGE
INTRAMUSCULAR | Status: AC
  Filled 2017-08-10: qty 10

## 2017-08-10 MED ORDER — DEXAMETHASONE SODIUM PHOSPHATE 10 MG/ML IJ SOLN
INTRAMUSCULAR | Status: AC
  Filled 2017-08-10: qty 2

## 2017-08-10 MED ORDER — SUCCINYLCHOLINE CHLORIDE 200 MG/10ML IV SOSY
PREFILLED_SYRINGE | INTRAVENOUS | Status: AC
  Filled 2017-08-10: qty 10

## 2017-08-10 MED ORDER — POTASSIUM CHLORIDE 20 MEQ PO PACK
20.0000 meq | PACK | Freq: Three times a day (TID) | ORAL | Status: DC
  Administered 2017-08-10 (×3): 20 meq via ORAL
  Filled 2017-08-10 (×4): qty 1

## 2017-08-10 MED ORDER — FUROSEMIDE 10 MG/ML IJ SOLN
40.0000 mg | Freq: Once | INTRAMUSCULAR | Status: AC
  Administered 2017-08-10: 40 mg via INTRAVENOUS
  Filled 2017-08-10: qty 4

## 2017-08-10 MED ORDER — LIDOCAINE 2% (20 MG/ML) 5 ML SYRINGE
INTRAMUSCULAR | Status: AC
  Filled 2017-08-10: qty 5

## 2017-08-10 MED ORDER — ONDANSETRON HCL 4 MG/2ML IJ SOLN
INTRAMUSCULAR | Status: AC
  Filled 2017-08-10: qty 4

## 2017-08-10 MED ORDER — PHENYLEPHRINE 40 MCG/ML (10ML) SYRINGE FOR IV PUSH (FOR BLOOD PRESSURE SUPPORT)
PREFILLED_SYRINGE | INTRAVENOUS | Status: AC
  Filled 2017-08-10: qty 10

## 2017-08-10 MED ORDER — FENTANYL CITRATE (PF) 100 MCG/2ML IJ SOLN
INTRAMUSCULAR | Status: AC
  Filled 2017-08-10: qty 2

## 2017-08-10 NOTE — Progress Notes (Signed)
Urology Inpatient Progress Report  RIGHT URETERAL OBSTRUCTION  Intv/Subj The patient was admitted yesterday and her surgery canceled due to chest pain in the preoperative holding area as well as a new finding of asymmetric lower extremity swelling.  The patient was subsequently evaluated by cardiology and her troponins have been reassuring.  Lower extremity duplex demonstrated no evidence of DVT.  This morning the patient had a low-grade fever and worsening weakness.  She also has had decreased oxygenation.  The patient's pain has been relatively well controlled.    She has no complaints this morning except for fatigue.    Principal Problem:   Chest pain Active Problems:   COPD with emphysema (HCC)   Ureteral stent displacement (HCC)   Lower extremity edema, L>R  Current Facility-Administered Medications  Medication Dose Route Frequency Provider Last Rate Last Dose  . albuterol (PROVENTIL) (2.5 MG/3ML) 0.083% nebulizer solution 3 mL  3 mL Inhalation Q6H PRN Barrett, Rhonda G, PA-C      . amLODipine (NORVASC) tablet 5 mg  5 mg Oral Daily Barrett, Rhonda G, PA-C      . bisacodyl (DULCOLAX) suppository 10 mg  10 mg Rectal Daily PRN Ardis Hughs, MD      . ciprofloxacin (CIPRO) IVPB 400 mg  400 mg Intravenous Once Ardis Hughs, MD      . docusate sodium (COLACE) capsule 100 mg  100 mg Oral BID Ardis Hughs, MD   100 mg at 08/09/17 2118  . escitalopram (LEXAPRO) tablet 20 mg  20 mg Oral Daily Barrett, Rhonda G, PA-C   20 mg at 08/09/17 2118  . famotidine (PEPCID) tablet 20 mg  20 mg Oral QHS Barrett, Rhonda G, PA-C   20 mg at 08/09/17 2118  . fluticasone (FLONASE) 50 MCG/ACT nasal spray 2 spray  2 spray Each Nare Daily Barrett, Rhonda G, PA-C      . furosemide (LASIX) injection 40 mg  40 mg Intravenous Once Ardis Hughs, MD      . HYDROcodone-acetaminophen (NORCO/VICODIN) 5-325 MG per tablet 1-2 tablet  1-2 tablet Oral Q4H PRN Ardis Hughs, MD   1 tablet  at 08/10/17 [phone]  . HYDROmorphone (DILAUDID) injection 0.5 mg  0.5 mg Intravenous Q2H PRN Ardis Hughs, MD      . insulin aspart (novoLOG) injection 0-9 Units  0-9 Units Subcutaneous Q4H Ardis Hughs, MD      . ketorolac (TORADOL) 15 MG/ML injection 15 mg  15 mg Intravenous Q6H PRN Ardis Hughs, MD   15 mg at 08/09/17 1534  . lactated ringers infusion   Intravenous Continuous Ardis Hughs, MD 100 mL/hr at 08/10/17 0827    . losartan (COZAAR) tablet 50 mg  50 mg Oral BID Barrett, Evelene Croon, PA-C   50 mg at 08/09/17 2117  . montelukast (SINGULAIR) tablet 10 mg  10 mg Oral Daily Minda Ditto, RPH      . ondansetron (ZOFRAN) tablet 4 mg  4 mg Oral Q6H PRN Ardis Hughs, MD       Or  . ondansetron [facility]) injection 4 mg  4 mg Intravenous Q6H PRN Ardis Hughs, MD      . pantoprazole (PROTONIX) EC tablet 40 mg  40 mg Oral Daily Barrett, Rhonda G, PA-C      . polyethylene glycol (MIRALAX / GLYCOLAX) packet 17 g  17 g Oral Daily PRN Ardis Hughs, MD      . potassium chloride (KLOR-CON) packet  20 mEq  20 mEq Oral TID Ardis Hughs, MD      . sodium chloride 0.45 % 1,000 mL with potassium chloride 40 mEq infusion   Intravenous Continuous Ardis Hughs, MD      . sodium chloride flush (NS) 0.9 % injection 3 mL  3 mL Intravenous Q12H Ardis Hughs, MD      . umeclidinium-vilanterol [facility] ELLIPTA) 62.5-25 MCG/INH 1 puff  1 puff Inhalation Daily Minda Ditto, RPH         Objective: Vital: Vitals:   08/09/17 1331 08/09/17 2115 08/10/17 0643 08/10/17 0814  BP: (!) 146/73 (!) 142/73 (!) 170/85 (!) 155/77  Pulse: 66 76 83 85  Resp: 18 18 (!) 30   Temp: 98.2 F (36.8 C) 99.2 F (37.3 C) (!) 100.4 F (38 C) (!) 100.8 F (38.2 C)  TempSrc: Oral Oral Oral Oral  SpO2: 91% 91% 90% 90%  Weight:      Height:       I/Os: I/O last 3 completed shifts: In: 1418.3 [P.O.:120; I.V.:1298.3] Out: 250 [Urine:250]  Physical Exam:  General:  Patient is in no apparent distress, alert and oriented Lungs: Normal respiratory effort, chest expands symmetrically,   Her lungs are clear to auscultation bilaterally posteriorly in the lower lobe. Cardiovascular: The patient's heart rate is regular with no appreciable murmur.  Her lower extremity pitting edema is significantly improved. GI:  The abdomen is soft and nontender without mass. Skin: The patient has bruising in her lower extremities Ext: She has some edema bilaterally, but this has improved significantly from yesterday's exam.  Lab Results: Recent Labs    08/07/17 0946 08/09/17 1416 08/10/17 0235  WBC 16.2*  --  9.1  HGB 11.9* 11.4* 11.8*  HCT 37.6 36.2 37.1   Recent Labs    08/07/17 0946 08/10/17 0235  NA 138 137  K 3.9 3.2*  CL 100* 98*  CO2 27 28  GLUCOSE 115* 88  BUN 27* 17  CREATININE 0.79 0.65  CALCIUM 9.9 8.8*   No results for input(s): LABPT, INR in the last 72 hours. No results for input(s): LABURIN in the last 72 hours. Results for orders placed or performed during the hospital encounter of 11/14/15  MRSA PCR Screening     Status: None   Collection Time: 11/17/15  9:29 AM  Result Value Ref Range Status   MRSA by PCR NEGATIVE NEGATIVE Final    Comment:        The GeneXpert MRSA Assay (FDA approved for NASAL specimens only), is one component of a comprehensive MRSA colonization surveillance program. It is not intended to diagnose MRSA infection nor to guide or monitor treatment for MRSA infections.     Studies/Results: Lower extremity duplex demonstrates no evidence of deep venous thrombosis.  I have reviewed the report and discussed this with the patient and her family. Telemetry: Normal sinus rhythm  Assessment: The patient has a small poorly defined lesion in the right pelvis overlying the iliac vessels and encasing the right ureter creating hydronephrosis.  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  She is having severe pain in her right side, which began around the time of the placement of the stent.  Initially, she presented for stent exchange today, but because of the chest pain and asymmetric swelling in her extremities we canceled the case.  This morning she has had a low-grade fever and worsening weakness.  Plan: Chest pain: The patient has been evaluated by cardiology, no changes on the EKG and her troponins have been negative, and this is not thought to be related to myocardial infarction.  Will resume aspirin 81mg  as well as her home HTN meds.  Other considerations include acid reflux and pneumonia.  Appreciate cardiology's input/recommendations.  Fever/ Shortness of breath: The patient has baseline COPD and recently resumed smoking.  I suspect some of her cough today is related to the resuming of her smoking.  Her lungs are clear today - less suspicious of pulmonary edema.  I did order a chest x-ray to ensure that she has not developed pneumonia.   A urine culture is also been obtained.  We will start antibiotics with any positive findings, but I have not started any empirically.  I encouraged the patient to use her incentive spirometer, and spend as much time as possible out of her bed.   Retroperitoneal fibrosis: Our plan is to obtain a renal ultrasound today to ensure that she is not developed hydronephrosis and establish a baseline.  Given the trouble that she has had with her stent, our plan is to ensure that she does not have a urinary tract infection, treat if necessary, and then remove her stent in clinic next week.  We will then monitor her clinically for worsening pain and serial ultrasounds for worsening hydronephrosis.  If she needs any additional procedures for hydronephrosis we would likely opt for a nephrostomy tube in the future.  FEN: The patient is now on a regular diet.  I did order 40 mg of IV Lasix and changed her fluids from lactated  Ringer's to maintenance fluid.  Her electrolytes are being replaced.  Dispo: We will see how she does with the above plan today, potentially she could be ready for discharge tomorrow.  If not, she will likely need to be transferred to the internal medicine service.  Louis Meckel, MD Urology 08/10/2017, 9:23 AM

## 2017-08-10 NOTE — Progress Notes (Signed)
CRITICAL VALUE ALERT  Critical Value:  Troponin 0.03  Date & Time Notied:  08/10/2017 @ 0333  Provider Notified: Bodenheimer NP   Orders Received/Actions taken: No new orders, will continue to monitor

## 2017-08-10 NOTE — Progress Notes (Signed)
  Echocardiogram 2D Echocardiogram has been performed.  Merrie Roof F 08/10/2017, 4:58 PM

## 2017-08-10 NOTE — Progress Notes (Addendum)
Progress Note  Patient Name: Gina Fritz Date of Encounter: 08/10/2017  Primary Cardiologist:  Kirk Ruths, MD  Subjective   Pt was up all night, up and down to BR, urinating. No specific complaints today.  Pt  w/ temp 100.8 this am, also had O2 sats drop to the 80s. Urologic procedures on hold. Dr Louis Meckel ordered Lasix 40 mg IV x 1. Per family, he prefers she be on Irbesartan.    Inpatient Medications    Scheduled Meds: . amLODipine  5 mg Oral Daily  . docusate sodium  100 mg Oral BID  . escitalopram  20 mg Oral Daily  . famotidine  20 mg Oral QHS  . fluticasone  2 spray Each Nare Daily  . furosemide  40 mg Intravenous Once  . insulin aspart  0-9 Units Subcutaneous Q4H  . losartan  50 mg Oral BID  . montelukast  10 mg Oral Daily  . pantoprazole  40 mg Oral Daily  . potassium chloride  20 mEq Oral TID  . sodium chloride flush  3 mL Intravenous Q12H  . umeclidinium-vilanterol  1 puff Inhalation Daily   Continuous Infusions: . ciprofloxacin    . lactated ringers 100 mL/hr at 08/10/17 0827  . sodium chloride 0.45 % with kcl     PRN Meds: albuterol, bisacodyl, HYDROcodone-acetaminophen, HYDROmorphone (DILAUDID) injection, ketorolac, ondansetron **OR** ondansetron (ZOFRAN) IV, polyethylene glycol   Vital Signs    Vitals:   08/09/17 1331 08/09/17 2115 08/10/17 0643 08/10/17 0814  BP: (!) 146/73 (!) 142/73 (!) 170/85 (!) 155/77  Pulse: 66 76 83 85  Resp: 18 18 (!) 30   Temp: 98.2 F (36.8 C) 99.2 F (37.3 C) (!) 100.4 F (38 C) (!) 100.8 F (38.2 C)  TempSrc: Oral Oral Oral Oral  SpO2: 91% 91% 90% 90%  Weight:      Height:        Intake/Output Summary (Last 24 hours) at 08/10/2017 0948 Last data filed at 08/10/2017 0300 Gross per 24 hour  Intake 1418.33 ml  Output 250 ml  Net 1168.33 ml   Filed Weights   08/09/17 1132  Weight: 113 lb (51.3 kg)    Telemetry    SR, ST, occ PVCs - Personally Reviewed  ECG     03/08, SR, HR 68, no acute  ischemic changes  - Personally Reviewed  Physical Exam   General: Well developed, well nourished, female appearing in no acute distress. Head: Normocephalic, atraumatic.  Neck: Supple without bruits, JVD not elevated. Lungs:  Resp regular and unlabored, decreased BS bases, slight wheeze Heart: RRR, S1, S2, no S3, S4, or murmur; no rub. Abdomen: Soft, non-tender, non-distended with normoactive bowel sounds. No hepatomegaly. No rebound/guarding. No obvious abdominal masses. Extremities: No clubbing, cyanosis, no edema. Distal pedal pulses are 2+ bilaterally. Neuro: Alert and oriented X 3. Moves all extremities spontaneously. Psych: Normal affect.  Labs    Hematology Recent Labs  Lab 08/07/17 0946 08/09/17 1416 08/10/17 0235  WBC 16.2*  --  9.1  RBC 4.11  --  4.16  HGB 11.9* 11.4* 11.8*  HCT 37.6 36.2 37.1  MCV 91.5  --  89.2  MCH 29.0  --  28.4  MCHC 31.6  --  31.8  RDW 15.3  --  15.2  PLT 281  --  227    Chemistry Recent Labs  Lab 08/07/17 0946 08/10/17 0235  NA 138 137  K 3.9 3.2*  CL 100* 98*  CO2 27 28  GLUCOSE  115* 88  BUN 27* 17  CREATININE 0.79 0.65  CALCIUM 9.9 8.8*  GFRNONAA >60 >60  GFRAA >60 >60  ANIONGAP 11 11     Cardiac Enzymes Recent Labs  Lab 08/09/17 1416 08/09/17 2032 08/10/17 0235  TROPONINI <0.03 <0.03 0.03*    Radiology    No results found.   Cardiac Studies   ECHO:  Ordered  LE Dopplers: no DVT  Patient Profile     77 y.o. female w/ hx HTN, HLD, migraines w/ near-syncope, Ao atherosclerosis on CT 04/2017, asthma, IBS, GERD, OA, nephrolithiasis, COPD, pelvic mass near R ureter, tob use, came in for urologic procedure and was evaluated by cards for CP and LE edema.  Assessment & Plan     Principal Problem: 1.  Chest pain - cardiac ez neg MI - no recurrence of sx - outpt MV recommended - However, pt condition has changed so will have her f/u with Dr Stanford Breed and decide on timing of MV at that time.  Active  Problems: 2.  Ureteral stent displacement ([place]) - per Dr Louis Meckel  3.  COPD with emphysema ([place]) - on home Rx but O2 sats lower this am  4.  Lower extremity edema, L>R - improved overnight, possible autodiuresis as she was up/down to BR - Lasix ordered by Dr Louis Meckel for decreased BS and O2 sats - f/u on echo   Jonetta Speak , PA-C 9:48 AM 08/10/2017 Pager: [phone]  I have seen and examined the patient along with Rosaria Ferries , PA-C, PA NP.  I have reviewed the chart, notes and new data.  I agree with PA/NP's note.  Key new complaints: Febrile, received some pain medicine and is sleepy.  No chest pain. Key examination changes: Lungs are clear of wheezes today, normal cardiovascular examination Key new findings / data: Source of fever not yet certain.  Work-up in progress.  Ureteral stent procedure canceled.  Normal cardiac troponin x3 assays.  No evidence of DVT by ultrasound.  PLAN: Her edema is not related to DVT, it may be due to steroid therapy, but we are repeating an echocardiogram to make sure there is no cardiac source of her edema.  As far as coronary evaluation, would plan for this to be an outpatient endeavor.  Sanda Klein, MD, [place] [phone] 08/10/2017, 11:39 AM

## 2017-08-10 NOTE — Progress Notes (Signed)
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  GOAL:   Patient will meet greater than or equal to 90% of their needs  MONITOR:   PO intake, Supplement acceptance, Weight trends, Labs  REASON FOR ASSESSMENT:   Malnutrition Screening Tool    ASSESSMENT:   Patient with PMH significant for colitis, COPD, HLD, GERD, and osteoporosis. Presents this admission from urology follow-up after undergoing right diagnostic ureteroscopy and retrograde pyelogram for right-sided hydroureteronephrosis. Pt was scheduled for stent exchange yesterday but thiswas canceled due to chest pain.    Spoke with pt and daughter at bedside. Reports intake has been decreased for 1-2 months due to recent surgeries. States she typically eats small quantities of food frequently throughout the day. As an example of her daily breakfast, she reports eating 1/2 a scrambled egg and nothing else. 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 Pt is currently on a regular diet but has not order her first meal. RD to order snacks daily to maximize protein and calories.   Pt endorses a UBW of 130 lb. States she has lost down to 95 lb in the past but has slowly gained back to 113 lb. Records indicate pt weighed 127 lb 02/14/17 and 113 lb this admission (11% wt loss in 6 months, significant for time frame). Nutrition-Focused physical exam completed.   Medications reviewed and include: SSI, 20 mEq KCl Labs reviewed: K 3.2 (L)   NUTRITION - FOCUSED PHYSICAL EXAM:    Most Recent Value  Orbital Region  No depletion  Upper Arm Region  Mild depletion  Thoracic and Lumbar  Region  Unable to assess  Buccal Region  No depletion  Temple Region  Moderate depletion  Clavicle Bone Region  Moderate depletion  Clavicle and Acromion Bone Region  Mild depletion  Scapular Bone Region  Unable to assess  Dorsal Hand  Mild depletion  Patellar Region  Moderate depletion  Anterior Thigh Region  Moderate depletion  Posterior Calf Region  Moderate depletion  Edema (RD Assessment)  Moderate  Hair  Reviewed  Eyes  Reviewed  Mouth  Reviewed  Skin  Reviewed  Nails  Reviewed     Diet Order:   Diet Order           Diet regular Room service appropriate? Yes; Fluid consistency: Thin  Diet effective now          EDUCATION NEEDS:   Education needs have been addressed  Skin:  Skin Assessment: Reviewed RN Assessment  Last BM:  PTA  Height:   Ht Readings from Last 1 Encounters:  08/09/17 5\' 2"  (1.575 m)    Weight:   Wt Readings from Last 1 Encounters:  08/09/17 113 lb (51.3 kg)    Ideal Body Weight:  50 kg  BMI:  Body mass index is 20.67 kg/m.  Estimated Nutritional Needs:   Kcal:  1500-1700 kcal  Protein:  75-85 g  Fluid:  >1.5 L/day    Mariana Single RD, LDN Clinical Nutrition Pager # [phone]

## 2017-08-10 NOTE — Op Note (Deleted)
See [facility] OP note scanned into chart. Also because of the size, density, location and other factors that cannot be anticipated I feel this will likely be a staged procedure. This fact supersedes any indication in the scanned [facility] operative note to the contrary.  

## 2017-08-11 DIAGNOSIS — R3989 Other symptoms and signs involving the genitourinary system: Secondary | ICD-10-CM | POA: Diagnosis not present

## 2017-08-11 DIAGNOSIS — J440 Chronic obstructive pulmonary disease with acute lower respiratory infection: Secondary | ICD-10-CM | POA: Diagnosis not present

## 2017-08-11 LAB — BASIC METABOLIC PANEL
Anion gap: 13 (ref 5–15)
BUN: 13 mg/dL (ref 6–20)
CALCIUM: 8.4 mg/dL — AB (ref 8.9–10.3)
CO2: 25 mmol/L (ref 22–32)
CREATININE: 0.68 mg/dL (ref 0.44–1.00)
Chloride: 97 mmol/L — ABNORMAL LOW (ref 101–111)
Glucose, Bld: 112 mg/dL — ABNORMAL HIGH (ref 65–99)
Potassium: 4 mmol/L (ref 3.5–5.1)
SODIUM: 135 mmol/L (ref 135–145)

## 2017-08-11 LAB — GLUCOSE, CAPILLARY
GLUCOSE-CAPILLARY: 110 mg/dL — AB (ref 65–99)
Glucose-Capillary: 116 mg/dL — ABNORMAL HIGH (ref 65–99)

## 2017-08-11 LAB — CBC
HCT: 37.2 % (ref 36.0–46.0)
Hemoglobin: 11.7 g/dL — ABNORMAL LOW (ref 12.0–15.0)
MCH: 27.4 pg (ref 26.0–34.0)
MCHC: 31.5 g/dL (ref 30.0–36.0)
MCV: 87.1 fL (ref 78.0–100.0)
PLATELETS: 272 10*3/uL (ref 150–400)
RBC: 4.27 MIL/uL (ref 3.87–5.11)
RDW: 15.3 % (ref 11.5–15.5)
WBC: 8.5 10*3/uL (ref 4.0–10.5)

## 2017-08-11 LAB — URINE CULTURE: Culture: <10000 — AB

## 2017-08-11 MED ORDER — HYDROCODONE-ACETAMINOPHEN 5-325 MG PO TABS: 1.0000 | ORAL_TABLET | ORAL | 0 refills | Status: DC | PRN

## 2017-08-11 MED ORDER — AZITHROMYCIN 500 MG PO TABS: 500.0000 mg | ORAL_TABLET | Freq: Every day | ORAL | 0 refills | Status: AC

## 2017-08-11 NOTE — Discharge Instructions (Signed)
Call for fevers > 101.5 Call for confusion or altered mental status Call for questions concerns.  Take Azithromycin as prescribed.

## 2017-08-11 NOTE — Progress Notes (Signed)
Urology Inpatient Progress Report  RIGHT URETERAL OBSTRUCTION  Intv/Subj The patient underwent echocardiogram demonstrating a normal EF with no significant abnormality. She was given Lasix with significant improvement in her oxygenation, as well as improvement in her lower extremity edema. Overall she feels better and is anxious to leave.  Oxycodone appears to be Treating her pain adequately.    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    Current Outpatient Medications  Medication Sig Dispense Refill  . acetaminophen (TYLENOL) 325 MG tablet Take 2 tablets (650 mg total) by mouth every 6 (six) hours as needed for mild pain (or Fever >/= 101). 30 tablet 1  . albuterol (PROAIR HFA) 108 (90 Base) MCG/ACT inhaler Inhale 2 puffs into the lungs every 6 (six) hours as needed for wheezing or shortness of breath. 1 Inhaler 2  . alendronate (FOSAMAX) 70 MG tablet Take 1 tablet (70 mg total) every Sunday by mouth. 13 tablet 3  . amLODipine (NORVASC) 5 MG tablet Take 1 tablet (5 mg total) by mouth daily. 90 tablet 1  . atorvastatin (LIPITOR) 20 MG tablet TAKE 1 TABLET(20 MG) BY MOUTH DAILY 90 tablet 0  . BESIVANCE 0.6 % SUSP Place 1 drop into both eyes See admin instructions. Takes the day before and the day of the dr visit 4 times a day    . Cholecalciferol (VITAMIN D) 2000 UNITS CAPS Take 2,000 Units by mouth daily.    Marland Kitchen escitalopram (LEXAPRO) 20 MG tablet TAKE 1 TABLET(20 MG) BY MOUTH DAILY 90 tablet 1  . fluticasone (FLONASE) 50 MCG/ACT nasal spray SHAKE LIQUID AND USE 2 SPRAYS IN EACH NOSTRIL DAILY 16 g 5  . irbesartan (AVAPRO) 75 MG tablet TAKE 1 TABLET(75 MG) BY MOUTH DAILY 90 tablet 1  . Melatonin 5 MG TABS Take by mouth.    . montelukast (SINGULAIR) 10 MG tablet TAKE 1 TABLET(10 MG) BY MOUTH DAILY 90 tablet 0  . Multiple  Vitamin (MULTIVITAMIN WITH MINERALS) TABS Take 1 tablet by mouth daily.    Marland Kitchen nystatin (MYCOSTATIN) 100000 UNIT/ML suspension SWISH AND SWALLOW 5 MILLITERS FOUR TIMES A DAY FOR 10 DAYS 473 mL 1  . omeprazole (PRILOSEC) 20 MG capsule TAKE ONE CAPSULE BY MOUTH EVERY DAY 90 capsule 0  . Oxycodone HCl 10 MG TABS Take 10-20 mg by mouth every 4 (four) hours as needed for pain.  0  . phenazopyridine (PYRIDIUM) 200 MG tablet Take 1 tablet (200 mg total) by mouth 3 (three) times daily as needed for pain. 10 tablet 0  . polyethylene glycol (MIRALAX / GLYCOLAX) packet Take 17 g by mouth daily as needed for mild constipation.    . predniSONE (DELTASONE) 50 MG tablet TK 1 T PO QD  5  . ranitidine (ZANTAC) 300 MG tablet Take 1 tablet (300 mg total) at bedtime by mouth. 90 tablet 3  . tamsulosin (FLOMAX) 0.4 MG CAPS capsule Take by mouth.    . trimethoprim-polymyxin b (POLYTRIM) ophthalmic solution     . VESICARE 10 MG tablet TAKE 1 TABLET(10 MG) BY MOUTH DAILY 90 tablet 0  . vitamin B-12 (CYANOCOBALAMIN) 1000 MCG tablet Take 1,000 mcg by mouth daily.    Marland Kitchen azithromycin (ZITHROMAX) 500 MG tablet Take 1 tablet (500 mg total) by mouth daily for 3 days. Take 1 tablet daily for 3 days. 5 tablet 0  .  belladona alk-PHENObarbital (DONNATAL) 16.2 MG tablet Take 1 tablet by mouth daily as needed (ibs). No more than 2 tabs per month. 15 tablet 1  . Blood Pressure Monitoring (ADULT BLOOD PRESSURE CUFF LG) KIT 1 applicator by Miscellaneous route daily.    . Glycopyrrolate-Formoterol (BEVESPI AEROSPHERE) 9-4.8 MCG/ACT AERO Inhale 2 puffs into the lungs 2 (two) times daily. 1 Inhaler 0  . HYDROcodone-acetaminophen (NORCO/VICODIN) 5-325 MG tablet Take 1-2 tablets by mouth every 4 (four) hours as needed for moderate pain. 30 tablet 0     Objective: Vital: Vitals:   08/10/17 1219 08/10/17 1252 08/10/17 2213 08/11/17 0515  BP:  117/64 128/65 124/77  Pulse:  78 71 74  Resp:  _0 Temp:  97.8 F (36.6 C) 99.7 F (37.6  C) 98.9 F (37.2 C)  TempSrc:  Oral Oral Oral  SpO2: 94% 93% 94% 92%  Weight:      Height:       I/Os: I/O last 3 completed shifts: In: 2602.5 [P.O.:540; I.V.:2062.5] Out: 325 [Urine:325]  Physical Exam:  General: Patient is in no apparent distress, alert and oriented Lungs: Normal respiratory effort, chest expands symmetrically,   Her lungs are clear to auscultation bilaterally posteriorly in the lower lobe. Cardiovascular: The patient's heart rate is regular with no appreciable murmur.  Her lower extremity pitting edema is significantly improved. GI:  The abdomen is soft and nontender without mass. Skin: The patient has bruising in her lower extremities Ext: Improved peripheral edema  Lab Results: Recent Labs    08/09/17 1416 08/10/17 0235 08/11/17 0437  WBC  --  9.1 8.5  HGB 11.4* 11.8* 11.7*  HCT 36.2 37.1 37.2   Recent Labs    08/10/17 0235 08/11/17 0437  NA 137 135  K 3.2* 4.0  CL 98* 97*  CO2 28 25  GLUCOSE 88 112*  BUN 17 13  CREATININE 0.65 0.68  CALCIUM 8.8* 8.4*   No results for input(s): LABPT, INR in the last 72 hours. No results for input(s): LABURIN in the last 72 hours. Results for orders placed or performed during the hospital encounter of 08/09/17  Culture, Urine     Status: Abnormal   Collection Time: 08/10/17 12:11 PM  Result Value Ref Range Status   Specimen Description   Final    URINE, CLEAN CATCH Performed at [facility], [place] [address], [place], [place] [address]    Special Requests   Final    NONE Performed at [facility], [place] [address] [place], [place] [address]    Culture (A)  Final    <10,000 COLONIES/mL INSIGNIFICANT GROWTH Performed at [place] [address], [place],  [address]    Report Status 08/11/2017 FINAL  Final    Studies/Results: Chest x-ray demonstrates a lower lobe posterior consolidation consistent with early pneumonia.  Assessment/Plan: The  patient has a small poorly defined lesion in the right pelvis overlying the iliac vessels and encasing the right ureter creating hydronephrosis.  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  She is having severe pain in her right side, which began around the time of the placement of the stent.  Initially, she presented for stent exchange today, but because of the chest pain and asymmetric swelling in her extremities we canceled the case.    The patient overall clinically is improved.  We are discharging her home with a 5-day course of  azithromycin for mild community-acquired pneumonia.

## 2017-08-11 NOTE — Discharge Summary (Signed)
Date of admission: 08/09/2017  Date of discharge: 08/11/2017  Admission diagnosis: Chest pain, asymmetric lower extremity edema  Discharge diagnosis:  #1 -Right lower lobe pneumonia with associated COPD #2  Mild right-sided hydroureteronephrosis with pain   Secondary diagnoses:  Patient Active Problem List   Diagnosis Date Noted  . Chest pain 08/09/2017  . Ureteral stent displacement ([place]) 08/09/2017  . Lower extremity edema, L>R 08/09/2017  . 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 GERD (gastroesophageal reflux disease) 10/19/2016  . Rectal sphincter spasm 05/26/2016  . Overactive bladder-urinary frequency 05/26/2016  . Bilateral low back pain 04/27/2016  . Generalized anxiety disorder 04/27/2016  . Hyperlipidemia 02/24/2016  . Osteoporosis 02/24/2016  . Essential hypertension, benign 12/18/2015  . Hypertensive urgency 11/14/2015  . COPD with emphysema ([place]) 05/22/2015  . 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 Malnutrition of moderate degree 02/09/2015  . Abdominal pain 02/05/2015  . Kidney stone on right side 02/05/2015  . Colitis 02/05/2015  . Unstable gait 04/22/2013    Procedures performed: Procedure(s): None History and Physical: For full details, please see admission history and physical. Briefly, Gina Fritz is a 77 y.o. year old patient with right pelvic mass encasing the iliac vessels and right ureter.  She presented for a stent exchange.  In in the preop holding area the patient complained of substernal chest pain and was noted to have lower extremity edema left greater than right.  Hospital Course: The patient was admitted and her surgery was canceled.  She also had a low-grade fever.  Cardiology was consulted and she had lower extremity duplex studies.  The lower extremity ultrasound demonstrated no evidence of DVT.  Cardiology performed a complete evaluation the troponins were negative.  Her echocardiogram was read as essentially normal.  She was  scheduled for follow-up as an outpatient for additional work-up.  She was given some Lasix and her lower extremity edema improved.  Chest x-ray demonstrated mild right lower lobe consolidation consistent with pneumonia.  She was discharged home with 5 days of azithromycin.  Her pain was treated with Vicodin seemed to be treating it adequately.  Her labs are otherwise normal.  Laboratory values:  Recent Labs    08/09/17 1416 08/10/17 0235 08/11/17 0437  WBC  --  9.1 8.5  HGB 11.4* 11.8* 11.7*  HCT 36.2 37.1 37.2   Recent Labs    08/10/17 0235 08/11/17 0437  NA 137 135  K 3.2* 4.0  CL 98* 97*  CO2 28 25  GLUCOSE 88 112*  BUN 17 13  CREATININE 0.65 0.68  CALCIUM 8.8* 8.4*   No results for input(s): LABPT, INR in the last 72 hours. No results for input(s): LABURIN in the last 72 hours. Results for orders placed or performed during the hospital encounter of 08/09/17  Culture, Urine     Status: Abnormal   Collection Time: 08/10/17 12:11 PM  Result Value Ref Range Status   Specimen Description   Final    URINE, CLEAN CATCH Performed at [facility], [place] [address], [place], [place] [address]    Special Requests   Final    NONE Performed at [facility], [place] [address] [place], [place] [address]    Culture (A)  Final    <10,000 COLONIES/mL INSIGNIFICANT GROWTH Performed at [place] [address], [place], [place] [address]    Report Status 08/11/2017 FINAL  Final    Disposition: Home  Discharge instruction: The patient was instructed  to be ambulatory but told to refrain from heavy lifting, strenuous activity, or driving.   Discharge medications:  Allergies as of 08/11/2017      Reactions   Penicillins Anaphylaxis, Other (See Comments)   Has patient had a PCN reaction causing immediate rash, facial/tongue/throat swelling, SOB or lightheadedness with hypotension: Yes Has patient had a PCN reaction causing severe rash  involving mucus membranes or skin necrosis: Yes Has patient had a PCN reaction that required hospitalization Yes Has patient had a PCN reaction occurring within the last 10 years: No If all of the above answers are "NO", then may proceed with Cephalosporin use.   Sulfa Antibiotics Anaphylaxis, Hives   Soy Allergy Itching, Rash      Medication List    STOP taking these medications   losartan 50 MG tablet Commonly known as:  COZAAR   traMADol 50 MG tablet Commonly known as:  ULTRAM   Zoster Vaccine Adjuvanted injection Commonly known as:  SHINGRIX     TAKE these medications   acetaminophen 325 MG tablet Commonly known as:  TYLENOL Take 2 tablets (650 mg total) by mouth every 6 (six) hours as needed for mild pain (or Fever >/= 101).   Adult Blood Pressure Cuff Lg Kit 1 applicator by Miscellaneous route daily.   albuterol 108 (90 Base) MCG/ACT inhaler Commonly known as:  PROAIR HFA Inhale 2 puffs into the lungs every 6 (six) hours as needed for wheezing or shortness of breath.   alendronate 70 MG tablet Commonly known as:  FOSAMAX Take 1 tablet (70 mg total) every Sunday by mouth.   amLODipine 5 MG tablet Commonly known as:  NORVASC Take 1 tablet (5 mg total) by mouth daily.   atorvastatin 20 MG tablet Commonly known as:  LIPITOR TAKE 1 TABLET(20 MG) BY MOUTH DAILY   azithromycin 500 MG tablet Commonly known as:  ZITHROMAX Take 1 tablet (500 mg total) by mouth daily for 3 days. Take 1 tablet daily for 3 days.   belladona alk-PHENObarbital 16.2 MG tablet Commonly known as:  DONNATAL Take 1 tablet by mouth daily as needed (ibs). No more than 2 tabs per month.   BESIVANCE 0.6 % Susp Generic drug:  Besifloxacin HCl Place 1 drop into both eyes See admin instructions. Takes the day before and the day of the dr visit 4 times a day   escitalopram 20 MG tablet Commonly known as:  LEXAPRO TAKE 1 TABLET(20 MG) BY MOUTH DAILY   fluticasone 50 MCG/ACT nasal spray Commonly  known as:  FLONASE SHAKE LIQUID AND USE 2 SPRAYS IN EACH NOSTRIL DAILY   Glycopyrrolate-Formoterol 9-4.8 MCG/ACT Aero Commonly known as:  BEVESPI AEROSPHERE Inhale 2 puffs into the lungs 2 (two) times daily.   HYDROcodone-acetaminophen 5-325 MG tablet Commonly known as:  NORCO/VICODIN Take 1-2 tablets by mouth every 4 (four) hours as needed for moderate pain.   irbesartan 75 MG tablet Commonly known as:  AVAPRO TAKE 1 TABLET(75 MG) BY MOUTH DAILY   Melatonin 5 MG Tabs Take by mouth.   montelukast 10 MG tablet Commonly known as:  SINGULAIR TAKE 1 TABLET(10 MG) BY MOUTH DAILY   multivitamin with minerals Tabs tablet Take 1 tablet by mouth daily.   nystatin 100000 UNIT/ML suspension Commonly known as:  MYCOSTATIN SWISH AND SWALLOW 5 MILLITERS FOUR TIMES A DAY FOR 10 DAYS   omeprazole 20 MG capsule Commonly known as:  PRILOSEC TAKE ONE CAPSULE BY MOUTH EVERY DAY   Oxycodone HCl 10 MG Tabs Take  10-20 mg by mouth every 4 (four) hours as needed for pain.   phenazopyridine 200 MG tablet Commonly known as:  PYRIDIUM Take 1 tablet (200 mg total) by mouth 3 (three) times daily as needed for pain.   polyethylene glycol packet Commonly known as:  MIRALAX / GLYCOLAX Take 17 g by mouth daily as needed for mild constipation.   predniSONE 50 MG tablet Commonly known as:  DELTASONE TK 1 T PO QD   ranitidine 300 MG tablet Commonly known as:  ZANTAC Take 1 tablet (300 mg total) at bedtime by mouth.   tamsulosin 0.4 MG Caps capsule Commonly known as:  FLOMAX Take by mouth.   trimethoprim-polymyxin b ophthalmic solution Commonly known as:  POLYTRIM   VESICARE 10 MG tablet Generic drug:  solifenacin TAKE 1 TABLET(10 MG) BY MOUTH DAILY   vitamin B-12 1000 MCG tablet Commonly known as:  CYANOCOBALAMIN Take 1,000 mcg by mouth daily.   Vitamin D 2000 units Caps Take 2,000 Units by mouth daily.       Followup:  Follow-up Information    Ardis Hughs, MD On  08/15/2017.   Specialty:  Urology Why:  1:30pm Contact information: [place] [id number] [phone]

## 2017-08-12 ENCOUNTER — Other Ambulatory Visit: Payer: Self-pay | Admitting: Family Medicine

## 2017-08-14 ENCOUNTER — Ambulatory Visit: Payer: Medicare Other | Admitting: Family Medicine

## 2017-08-14 NOTE — Progress Notes (Deleted)
Gina Fritz is a 77 y.o.female, who is here today to follow on HTN.  Last visit Losartan was discontinued and Avapro started. Currently she is on Avapro 75 mg daily. ***taking medications as instructed, no side effects reported.  ***has not noted unusual headache, visual changes, exertional chest pain, dyspnea,  focal weakness, or edema.    Lab Results  Component Value Date   CREATININE 0.68 08/11/2017   BUN 13 08/11/2017   NA 135 08/11/2017   K 4.0 08/11/2017   CL 97 (L) 08/11/2017   CO2 25 08/11/2017      Review of Systems   Current Outpatient Medications on File Prior to Visit  Medication Sig Dispense Refill  . acetaminophen (TYLENOL) 325 MG tablet Take 2 tablets (650 mg total) by mouth every 6 (six) hours as needed for mild pain (or Fever >/= 101). 30 tablet 1  . albuterol (PROAIR HFA) 108 (90 Base) MCG/ACT inhaler Inhale 2 puffs into the lungs every 6 (six) hours as needed for wheezing or shortness of breath. 1 Inhaler 2  . alendronate (FOSAMAX) 70 MG tablet Take 1 tablet (70 mg total) every Sunday by mouth. 13 tablet 3  . amLODipine (NORVASC) 5 MG tablet Take 1 tablet (5 mg total) by mouth daily. 90 tablet 1  . atorvastatin (LIPITOR) 20 MG tablet TAKE 1 TABLET(20 MG) BY MOUTH DAILY 90 tablet 0  . azithromycin (ZITHROMAX) 500 MG tablet Take 1 tablet (500 mg total) by mouth daily for 3 days. Take 1 tablet daily for 3 days. 5 tablet 0  . belladona alk-PHENObarbital (DONNATAL) 16.2 MG tablet Take 1 tablet by mouth daily as needed (ibs). No more than 2 tabs per month. 15 tablet 1  . BESIVANCE 0.6 % SUSP Place 1 drop into both eyes See admin instructions. Takes the day before and the day of the dr visit 4 times a day    . Blood Pressure Monitoring (ADULT BLOOD PRESSURE CUFF LG) KIT 1 applicator by Miscellaneous route daily.    . Cholecalciferol (VITAMIN D) 2000 UNITS CAPS Take 2,000 Units by mouth daily.    Marland Kitchen escitalopram (LEXAPRO) 20 MG tablet TAKE 1 TABLET(20  MG) BY MOUTH DAILY 90 tablet 1  . fluticasone (FLONASE) 50 MCG/ACT nasal spray SHAKE LIQUID AND USE 2 SPRAYS IN EACH NOSTRIL DAILY 16 g 5  . Glycopyrrolate-Formoterol (BEVESPI AEROSPHERE) 9-4.8 MCG/ACT AERO Inhale 2 puffs into the lungs 2 (two) times daily. 1 Inhaler 0  . HYDROcodone-acetaminophen (NORCO/VICODIN) 5-325 MG tablet Take 1-2 tablets by mouth every 4 (four) hours as needed for moderate pain. 30 tablet 0  . irbesartan (AVAPRO) 75 MG tablet TAKE 1 TABLET(75 MG) BY MOUTH DAILY 90 tablet 1  . Melatonin 5 MG TABS Take by mouth.    . montelukast (SINGULAIR) 10 MG tablet TAKE 1 TABLET(10 MG) BY MOUTH DAILY 90 tablet 0  . Multiple Vitamin (MULTIVITAMIN WITH MINERALS) TABS Take 1 tablet by mouth daily.    Marland Kitchen nystatin (MYCOSTATIN) 100000 UNIT/ML suspension SWISH AND SWALLOW 5 MILLITERS FOUR TIMES A DAY FOR 10 DAYS 473 mL 1  . omeprazole (PRILOSEC) 20 MG capsule TAKE ONE CAPSULE BY MOUTH EVERY DAY 90 capsule 0  . Oxycodone HCl 10 MG TABS Take 10-20 mg by mouth every 4 (four) hours as needed for pain.  0  . phenazopyridine (PYRIDIUM) 200 MG tablet Take 1 tablet (200 mg total) by mouth 3 (three) times daily as needed for pain. 10 tablet 0  . polyethylene glycol (  MIRALAX / GLYCOLAX) packet Take 17 g by mouth daily as needed for mild constipation.    . predniSONE (DELTASONE) 50 MG tablet TK 1 T PO QD  5  . ranitidine (ZANTAC) 300 MG tablet Take 1 tablet (300 mg total) at bedtime by mouth. 90 tablet 3  . tamsulosin (FLOMAX) 0.4 MG CAPS capsule Take by mouth.    . trimethoprim-polymyxin b (POLYTRIM) ophthalmic solution     . VESICARE 10 MG tablet TAKE 1 TABLET(10 MG) BY MOUTH DAILY 90 tablet 0  . vitamin B-12 (CYANOCOBALAMIN) 1000 MCG tablet Take 1,000 mcg by mouth daily.     No current facility-administered medications on file prior to visit.      Past Medical History:  Diagnosis Date  . Allergic rhinitis   . Anxiety   . Aortic atherosclerosis ([place]) 04/21/2017   noted on CT Chest  .  Arthritis    lower back  . Asthma   . Back pain   . CAP (community acquired pneumonia)    dx 02-05-2015  . Colitis    dx 02-05-2015  . COPD with emphysema ([place])   . Depression   . Dizziness   . GERD (gastroesophageal reflux disease)   . Headache    Migraines  . History of chronic sinusitis   . History of kidney stones   . Hyperlipidemia   . Hypertension   . Irritable bowel syndrome   . Loss of weight   . Lower extremity edema, L>R 08/09/2017  . Macular degeneration, bilateral   . Oral thrush   . Pulmonary nodules    benign and stable per CT  . Right ureteral stone   . Sensorineural hearing loss, unilateral   . Subjective tinnitus     Allergies  Allergen Reactions  . Penicillins Anaphylaxis and Other (See Comments)    Has patient had a PCN reaction causing immediate rash, facial/tongue/throat swelling, SOB or lightheadedness with hypotension: Yes Has patient had a PCN reaction causing severe rash involving mucus membranes or skin necrosis: Yes Has patient had a PCN reaction that required hospitalization Yes Has patient had a PCN reaction occurring within the last 10 years: No If all of the above answers are "NO", then may proceed with Cephalosporin use.   . Sulfa Antibiotics Anaphylaxis and Hives  . Soy Allergy Itching and Rash    Social History   Socioeconomic History  . Marital status: Married    Spouse name: [name]  . Number of children: 3  . Years of education: college  . Highest education level: Not on file  Occupational History    Comment: Retired  Scientific laboratory technician  . Financial resource strain: Not on file  . Food insecurity:    Worry: Not on file    Inability: Not on file  . Transportation needs:    Medical: Not on file    Non-medical: Not on file  Tobacco Use  . Smoking status: Current Every Day Smoker    Packs/day: 0.50    Years: 25.00    Pack years: 12.50    Types: Cigarettes  . Smokeless tobacco: Never Used  . Tobacco comment: Thinks her smoking  was more than 25 years abut is not exact   Substance and Sexual Activity  . Alcohol use: Yes    Alcohol/week: 0.6 oz    Types: 1 Glasses of wine per week    Comment: One drink a week.  . Drug use: No  . Sexual activity: Not on file  Lifestyle  .  Physical activity:    Days per week: Not on file    Minutes per session: Not on file  . Stress: Not on file  Relationships  . Social connections:    Talks on phone: Not on file    Gets together: Not on file    Attends religious service: Not on file    Active member of club or organization: Not on file    Attends meetings of clubs or organizations: Not on file    Relationship status: Not on file  Other Topics Concern  . Not on file  Social History Narrative   Patient is married [name]) and lives at home with her husband.   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   Caffeine- Six cups of coffee daily and six cups of coke cola daily.          There were no vitals filed for this visit. There is no height or weight on file to calculate BMI.    Physical Exam  ASSESSMENT AND PLAN:   Diagnoses and all orders for this visit:  Essential hypertension, benign      -Ms. Marche Hottenstein advised to return sooner than planned today if new concerns arise.     Betty G. Martinique, MD  [facility]. [place] office.

## 2017-08-15 ENCOUNTER — Ambulatory Visit: Payer: Medicare Other | Admitting: Family Medicine

## 2017-08-15 ENCOUNTER — Ambulatory Visit (HOSPITAL_COMMUNITY)
Admission: RE | Admit: 2017-08-15 | Discharge: 2017-08-15 | Disposition: A | Discharge status: Home / Self Care | Payer: Medicare Other | Source: Ambulatory Visit | Attending: Urology | Admitting: Urology

## 2017-08-15 ENCOUNTER — Other Ambulatory Visit: Payer: Self-pay | Admitting: Urology

## 2017-08-15 DIAGNOSIS — Z0289 Encounter for other administrative examinations: Secondary | ICD-10-CM

## 2017-08-15 DIAGNOSIS — R918 Other nonspecific abnormal finding of lung field: Secondary | ICD-10-CM | POA: Diagnosis not present

## 2017-08-15 DIAGNOSIS — J189 Pneumonia, unspecified organism: Secondary | ICD-10-CM | POA: Diagnosis not present

## 2017-08-18 ENCOUNTER — Telehealth: Payer: Self-pay | Admitting: Family Medicine

## 2017-08-18 ENCOUNTER — Other Ambulatory Visit: Payer: Self-pay | Admitting: Family Medicine

## 2017-08-18 DIAGNOSIS — M533 Sacrococcygeal disorders, not elsewhere classified: Secondary | ICD-10-CM

## 2017-08-18 NOTE — Telephone Encounter (Signed)
Spoke with patient and she stated that she is having coccygeal pain. She stated she fell and landed on her butt and that she is hoping that it is just muscle pain, but is just wanting to make sure. She also stated that she think she passed out.

## 2017-08-18 NOTE — Telephone Encounter (Signed)
Message sent to Dr. Jordan for review and approval. 

## 2017-08-18 NOTE — Telephone Encounter (Signed)
Copied from [place] [phone]. Topic: Quick Communication - See Telephone Encounter >> Aug 18, 2017  9:33 AM [place], Laban Emperor wrote: CRM for notification. See Telephone encounter for: 08/18/17.  Pt would like to know if Dr. Martinique would put in a x-ray order. States she fell 2 days ago on her bottom and it is blue.

## 2017-08-18 NOTE — Telephone Encounter (Signed)
Does she have hip pain,coccygeal pain? We need information to determine area where X ray is needed.  Thanks, BJ

## 2017-08-18 NOTE — Telephone Encounter (Signed)
Coccygeal x-ray order was placed.  Thanks, BJ

## 2017-08-21 ENCOUNTER — Emergency Department (HOSPITAL_COMMUNITY): Payer: Medicare Other

## 2017-08-21 ENCOUNTER — Inpatient Hospital Stay (HOSPITAL_COMMUNITY)
Admission: EM | Admit: 2017-08-21 | Discharge: 2017-08-24 | DRG: 391 | Disposition: A | Discharge status: Home Health | Payer: Medicare Other | Attending: Family Medicine | Admitting: Family Medicine

## 2017-08-21 ENCOUNTER — Encounter (HOSPITAL_COMMUNITY): Payer: Self-pay | Admitting: Nurse Practitioner

## 2017-08-21 ENCOUNTER — Other Ambulatory Visit: Payer: Self-pay

## 2017-08-21 DIAGNOSIS — N132 Hydronephrosis with renal and ureteral calculous obstruction: Secondary | ICD-10-CM | POA: Diagnosis present

## 2017-08-21 DIAGNOSIS — K529 Noninfective gastroenteritis and colitis, unspecified: Secondary | ICD-10-CM | POA: Diagnosis present

## 2017-08-21 DIAGNOSIS — N179 Acute kidney failure, unspecified: Secondary | ICD-10-CM | POA: Diagnosis present

## 2017-08-21 DIAGNOSIS — E876 Hypokalemia: Secondary | ICD-10-CM | POA: Diagnosis present

## 2017-08-21 DIAGNOSIS — Z7952 Long term (current) use of systemic steroids: Secondary | ICD-10-CM | POA: Diagnosis not present

## 2017-08-21 DIAGNOSIS — H905 Unspecified sensorineural hearing loss: Secondary | ICD-10-CM | POA: Diagnosis present

## 2017-08-21 DIAGNOSIS — Z79899 Other long term (current) drug therapy: Secondary | ICD-10-CM

## 2017-08-21 DIAGNOSIS — I10 Essential (primary) hypertension: Secondary | ICD-10-CM | POA: Diagnosis present

## 2017-08-21 DIAGNOSIS — Z7983 Long term (current) use of bisphosphonates: Secondary | ICD-10-CM | POA: Diagnosis not present

## 2017-08-21 DIAGNOSIS — Z91018 Allergy to other foods: Secondary | ICD-10-CM

## 2017-08-21 DIAGNOSIS — A09 Infectious gastroenteritis and colitis, unspecified: Principal | ICD-10-CM | POA: Diagnosis present

## 2017-08-21 DIAGNOSIS — F329 Major depressive disorder, single episode, unspecified: Secondary | ICD-10-CM | POA: Diagnosis present

## 2017-08-21 DIAGNOSIS — E785 Hyperlipidemia, unspecified: Secondary | ICD-10-CM | POA: Diagnosis present

## 2017-08-21 DIAGNOSIS — Z882 Allergy status to sulfonamides status: Secondary | ICD-10-CM | POA: Diagnosis not present

## 2017-08-21 DIAGNOSIS — Z880 Allergy status to penicillin: Secondary | ICD-10-CM

## 2017-08-21 DIAGNOSIS — Z8249 Family history of ischemic heart disease and other diseases of the circulatory system: Secondary | ICD-10-CM | POA: Diagnosis not present

## 2017-08-21 DIAGNOSIS — Z7951 Long term (current) use of inhaled steroids: Secondary | ICD-10-CM

## 2017-08-21 DIAGNOSIS — E43 Unspecified severe protein-calorie malnutrition: Secondary | ICD-10-CM | POA: Diagnosis present

## 2017-08-21 DIAGNOSIS — K219 Gastro-esophageal reflux disease without esophagitis: Secondary | ICD-10-CM | POA: Diagnosis present

## 2017-08-21 DIAGNOSIS — N1330 Unspecified hydronephrosis: Secondary | ICD-10-CM | POA: Diagnosis present

## 2017-08-21 DIAGNOSIS — F17210 Nicotine dependence, cigarettes, uncomplicated: Secondary | ICD-10-CM | POA: Diagnosis present

## 2017-08-21 DIAGNOSIS — H3530 Unspecified macular degeneration: Secondary | ICD-10-CM | POA: Diagnosis present

## 2017-08-21 DIAGNOSIS — Z681 Body mass index (BMI) 19 or less, adult: Secondary | ICD-10-CM | POA: Diagnosis not present

## 2017-08-21 DIAGNOSIS — F419 Anxiety disorder, unspecified: Secondary | ICD-10-CM | POA: Diagnosis present

## 2017-08-21 DIAGNOSIS — D638 Anemia in other chronic diseases classified elsewhere: Secondary | ICD-10-CM | POA: Diagnosis present

## 2017-08-21 DIAGNOSIS — J439 Emphysema, unspecified: Secondary | ICD-10-CM | POA: Diagnosis present

## 2017-08-21 DIAGNOSIS — J440 Chronic obstructive pulmonary disease with acute lower respiratory infection: Secondary | ICD-10-CM | POA: Diagnosis present

## 2017-08-21 LAB — BASIC METABOLIC PANEL
Anion gap: 13 (ref 5–15)
BUN: 27 mg/dL — AB (ref 6–20)
CHLORIDE: 103 mmol/L (ref 101–111)
CO2: 23 mmol/L (ref 22–32)
Calcium: 9.4 mg/dL (ref 8.9–10.3)
Creatinine, Ser: 1.58 mg/dL — ABNORMAL HIGH (ref 0.44–1.00)
GFR calc Af Amer: 36 mL/min — ABNORMAL LOW (ref >60)
GFR calc non Af Amer: 31 mL/min — ABNORMAL LOW (ref >60)
Glucose, Bld: 111 mg/dL — ABNORMAL HIGH (ref 65–99)
Potassium: 3.6 mmol/L (ref 3.5–5.1)
Sodium: 139 mmol/L (ref 135–145)

## 2017-08-21 LAB — CBC WITH DIFFERENTIAL/PLATELET
Basophils Absolute: 0 10*3/uL (ref 0.0–0.1)
Basophils Relative: 0 %
Eosinophils Absolute: 0 10*3/uL (ref 0.0–0.7)
Eosinophils Relative: 0 %
HEMATOCRIT: 36.1 % (ref 36.0–46.0)
HEMOGLOBIN: 11.5 g/dL — AB (ref 12.0–15.0)
LYMPHS ABS: 2 10*3/uL (ref 0.7–4.0)
LYMPHS PCT: 8 %
MCH: 27.3 pg (ref 26.0–34.0)
MCHC: 31.9 g/dL (ref 30.0–36.0)
MCV: 85.7 fL (ref 78.0–100.0)
Monocytes Absolute: 0.4 10*3/uL (ref 0.1–1.0)
Monocytes Relative: 2 %
NEUTROS PCT: 90 %
Neutro Abs: 22.2 10*3/uL — ABNORMAL HIGH (ref 1.7–7.7)
Platelets: 282 10*3/uL (ref 150–400)
RBC: 4.21 MIL/uL (ref 3.87–5.11)
RDW: 15.7 % — ABNORMAL HIGH (ref 11.5–15.5)
WBC: 24.6 10*3/uL — AB (ref 4.0–10.5)

## 2017-08-21 LAB — I-STAT CHEM 8, ED
BUN: 23 mg/dL — AB (ref 6–20)
CHLORIDE: 100 mmol/L — AB (ref 101–111)
Calcium, Ion: 1.16 mmol/L (ref 1.15–1.40)
Creatinine, Ser: 1.6 mg/dL — ABNORMAL HIGH (ref 0.44–1.00)
GLUCOSE: 108 mg/dL — AB (ref 65–99)
HCT: 33 % — ABNORMAL LOW (ref 36.0–46.0)
Hemoglobin: 11.2 g/dL — ABNORMAL LOW (ref 12.0–15.0)
Potassium: 3.4 mmol/L — ABNORMAL LOW (ref 3.5–5.1)
Sodium: 134 mmol/L — ABNORMAL LOW (ref 135–145)
TCO2: 24 mmol/L (ref 22–32)

## 2017-08-21 LAB — POC OCCULT BLOOD, ED: Fecal Occult Bld: NEGATIVE

## 2017-08-21 LAB — MAGNESIUM: MAGNESIUM: 2.2 mg/dL (ref 1.7–2.4)

## 2017-08-21 MED ORDER — ONDANSETRON HCL 4 MG/2ML IJ SOLN
4.0000 mg | Freq: Once | INTRAMUSCULAR | Status: AC
  Administered 2017-08-21: 4 mg via INTRAVENOUS
  Filled 2017-08-21: qty 2

## 2017-08-21 MED ORDER — MORPHINE SULFATE (PF) 2 MG/ML IV SOLN
2.0000 mg | Freq: Once | INTRAVENOUS | Status: AC
  Administered 2017-08-21: 2 mg via INTRAVENOUS
  Filled 2017-08-21: qty 1

## 2017-08-21 MED ORDER — ACETAMINOPHEN 325 MG PO TABS: 650.0000 mg | ORAL_TABLET | Freq: Four times a day (QID) | ORAL | Status: DC | PRN

## 2017-08-21 MED ORDER — ESCITALOPRAM OXALATE 10 MG PO TABS
20.0000 mg | ORAL_TABLET | Freq: Every day | ORAL | Status: DC
  Administered 2017-08-21 – 2017-08-24 (×4): 20 mg via ORAL
  Filled 2017-08-21 (×4): qty 2

## 2017-08-21 MED ORDER — PREDNISONE 20 MG PO TABS
20.0000 mg | ORAL_TABLET | Freq: Two times a day (BID) | ORAL | Status: DC
  Administered 2017-08-21 – 2017-08-24 (×7): 20 mg via ORAL
  Filled 2017-08-21 (×6): qty 1

## 2017-08-21 MED ORDER — ACETAMINOPHEN 500 MG PO TABS
1000.0000 mg | ORAL_TABLET | Freq: Once | ORAL | Status: AC
  Administered 2017-08-21: 1000 mg via ORAL
  Filled 2017-08-21: qty 2

## 2017-08-21 MED ORDER — VITAMIN B-12 1000 MCG PO TABS
1000.0000 ug | ORAL_TABLET | Freq: Every day | ORAL | Status: DC
  Administered 2017-08-21 – 2017-08-24 (×4): 1000 ug via ORAL
  Filled 2017-08-21 (×4): qty 1

## 2017-08-21 MED ORDER — PROMETHAZINE HCL 25 MG/ML IJ SOLN: 6.2500 mg | Freq: Four times a day (QID) | INTRAMUSCULAR | Status: DC | PRN

## 2017-08-21 MED ORDER — METRONIDAZOLE IN NACL 5-0.79 MG/ML-% IV SOLN
500.0000 mg | Freq: Three times a day (TID) | INTRAVENOUS | Status: DC
  Administered 2017-08-21 – 2017-08-23 (×6): 500 mg via INTRAVENOUS
  Filled 2017-08-21 (×6): qty 100

## 2017-08-21 MED ORDER — ATORVASTATIN CALCIUM 10 MG PO TABS
20.0000 mg | ORAL_TABLET | Freq: Every day | ORAL | Status: DC
  Administered 2017-08-21 – 2017-08-23 (×3): 20 mg via ORAL
  Filled 2017-08-21 (×3): qty 2
  Filled 2017-08-21: qty 1

## 2017-08-21 MED ORDER — ARFORMOTEROL TARTRATE 15 MCG/2ML IN NEBU
15.0000 ug | INHALATION_SOLUTION | Freq: Two times a day (BID) | RESPIRATORY_TRACT | Status: DC
  Administered 2017-08-21 – 2017-08-24 (×7): 15 ug via RESPIRATORY_TRACT
  Filled 2017-08-21 (×7): qty 2

## 2017-08-21 MED ORDER — UMECLIDINIUM BROMIDE 62.5 MCG/INH IN AEPB
1.0000 | INHALATION_SPRAY | Freq: Every day | RESPIRATORY_TRACT | Status: DC
  Administered 2017-08-21 – 2017-08-24 (×4): 1 via RESPIRATORY_TRACT
  Filled 2017-08-21: qty 7

## 2017-08-21 MED ORDER — CIPROFLOXACIN IN D5W 400 MG/200ML IV SOLN
400.0000 mg | Freq: Once | INTRAVENOUS | Status: DC
  Filled 2017-08-21: qty 200

## 2017-08-21 MED ORDER — PHENAZOPYRIDINE HCL 200 MG PO TABS: 200.0000 mg | ORAL_TABLET | Freq: Three times a day (TID) | ORAL | Status: DC | PRN

## 2017-08-21 MED ORDER — CIPROFLOXACIN IN D5W 400 MG/200ML IV SOLN
400.0000 mg | INTRAVENOUS | Status: DC
  Administered 2017-08-22: 400 mg via INTRAVENOUS
  Filled 2017-08-21: qty 200

## 2017-08-21 MED ORDER — PANTOPRAZOLE SODIUM 40 MG PO TBEC
40.0000 mg | DELAYED_RELEASE_TABLET | Freq: Every day | ORAL | Status: DC
  Administered 2017-08-21 – 2017-08-24 (×4): 40 mg via ORAL
  Filled 2017-08-21 (×4): qty 1

## 2017-08-21 MED ORDER — CIPROFLOXACIN IN D5W 400 MG/200ML IV SOLN
400.0000 mg | Freq: Once | INTRAVENOUS | Status: AC
  Administered 2017-08-21: 400 mg via INTRAVENOUS

## 2017-08-21 MED ORDER — AMLODIPINE BESYLATE 5 MG PO TABS: 5.0000 mg | ORAL_TABLET | Freq: Every day | ORAL | Status: DC

## 2017-08-21 MED ORDER — ALBUTEROL SULFATE (2.5 MG/3ML) 0.083% IN NEBU: 2.5000 mg | INHALATION_SOLUTION | Freq: Four times a day (QID) | RESPIRATORY_TRACT | Status: DC | PRN

## 2017-08-21 MED ORDER — SODIUM CHLORIDE 0.9 % IV SOLN
INTRAVENOUS | Status: DC
  Administered 2017-08-21 – 2017-08-22 (×2): via INTRAVENOUS

## 2017-08-21 MED ORDER — METRONIDAZOLE IN NACL 5-0.79 MG/ML-% IV SOLN
500.0000 mg | Freq: Once | INTRAVENOUS | Status: AC
  Administered 2017-08-21: 500 mg via INTRAVENOUS
  Filled 2017-08-21: qty 100

## 2017-08-21 MED ORDER — VITAMIN D3 25 MCG (1000 UNIT) PO TABS
2000.0000 [IU] | ORAL_TABLET | Freq: Every day | ORAL | Status: DC
  Administered 2017-08-21 – 2017-08-24 (×4): 2000 [IU] via ORAL
  Filled 2017-08-21 (×5): qty 2

## 2017-08-21 MED ORDER — CIPROFLOXACIN IN D5W 400 MG/200ML IV SOLN: 400.0000 mg | Freq: Two times a day (BID) | INTRAVENOUS | Status: DC

## 2017-08-21 MED ORDER — ONDANSETRON HCL 4 MG/2ML IJ SOLN: 4.0000 mg | Freq: Four times a day (QID) | INTRAMUSCULAR | Status: DC | PRN

## 2017-08-21 MED ORDER — ACETAMINOPHEN 650 MG RE SUPP: 650.0000 mg | Freq: Four times a day (QID) | RECTAL | Status: DC | PRN

## 2017-08-21 MED ORDER — MONTELUKAST SODIUM 10 MG PO TABS
10.0000 mg | ORAL_TABLET | Freq: Every day | ORAL | Status: DC
  Administered 2017-08-21 – 2017-08-23 (×3): 10 mg via ORAL
  Filled 2017-08-21 (×3): qty 1

## 2017-08-21 MED ORDER — FLUTICASONE PROPIONATE 50 MCG/ACT NA SUSP
2.0000 | Freq: Every day | NASAL | Status: DC
  Administered 2017-08-21 – 2017-08-24 (×4): 2 via NASAL
  Filled 2017-08-21: qty 16

## 2017-08-21 MED ORDER — SODIUM CHLORIDE 0.9 % IV BOLUS
1000.0000 mL | Freq: Once | INTRAVENOUS | Status: AC
  Administered 2017-08-21: 1000 mL via INTRAVENOUS

## 2017-08-21 MED ORDER — HYDROMORPHONE HCL 1 MG/ML IJ SOLN
0.5000 mg | INTRAMUSCULAR | Status: DC | PRN
  Administered 2017-08-21 (×2): 1 mg via INTRAVENOUS
  Administered 2017-08-22: 0.5 mg via INTRAVENOUS
  Administered 2017-08-22: 1 mg via INTRAVENOUS
  Filled 2017-08-21 (×4): qty 1

## 2017-08-21 MED ORDER — ONDANSETRON HCL 4 MG PO TABS: 4.0000 mg | ORAL_TABLET | Freq: Four times a day (QID) | ORAL | Status: DC | PRN

## 2017-08-21 MED ORDER — ADULT MULTIVITAMIN W/MINERALS CH
1.0000 | ORAL_TABLET | Freq: Every day | ORAL | Status: DC
  Administered 2017-08-21 – 2017-08-24 (×4): 1 via ORAL
  Filled 2017-08-21 (×4): qty 1

## 2017-08-21 NOTE — Progress Notes (Addendum)
Pharmacy Antibiotic Note  Gina Fritz is a 77 y.o. female admitted on 08/21/2017 with intra-abdominal.  Pharmacy has been consulted for cipro dosing.  Plan: Cipro 400 mg IV q24h Flagyl 500 mg IV q8h (MD) F/u scr/cultures     Temp (24hrs), Avg:97.2 F (36.2 C), Min:97.2 F (36.2 C), Max:97.2 F (36.2 C)  Recent Labs  Lab 08/21/17 0240 08/21/17 0337  WBC 24.6*  --   CREATININE 1.58* 1.60*    Estimated Creatinine Clearance: 23.7 mL/min (A) (by C-G formula based on SCr of 1.6 mg/dL (H)).    Allergies  Allergen Reactions  . Penicillins Anaphylaxis and Other (See Comments)    Has patient had a PCN reaction causing immediate rash, facial/tongue/throat swelling, SOB or lightheadedness with hypotension: Yes Has patient had a PCN reaction causing severe rash involving mucus membranes or skin necrosis: Yes Has patient had a PCN reaction that required hospitalization Yes Has patient had a PCN reaction occurring within the last 10 years: No If all of the above answers are "NO", then may proceed with Cephalosporin use.   . Sulfa Antibiotics Anaphylaxis and Hives  . Soy Allergy Itching and Rash    Antimicrobials this admission: 5/20 cipro >>  5/20 flagyl >>   Dose adjustments this admission:   Microbiology results:  BCx:   UCx:    Sputum:    MRSA PCR:   Thank you for allowing pharmacy to be a part of this patient's care.  Dorrene German 08/21/2017 6:06 AM

## 2017-08-21 NOTE — ED Notes (Signed)
ED TO INPATIENT HANDOFF REPORT  Name/Age/Gender Gina Fritz 77 y.o. female  Code Status    Code Status Orders  (From admission, onward)        Start     Ordered   08/21/17 0602  Full code  Continuous     08/21/17 0602    Code Status History    Date Active Date Inactive Code Status Order ID Comments User Context   08/09/2017 1351 08/11/2017 1109 Full Code [id number]  Ardis Hughs, MD Inpatient   11/14/2015 2346 11/20/2015 1832 Full Code [id number]  Toy Baker, MD Inpatient   02/05/2015 1908 02/10/2015 1626 DNR [id number]  Oswald Hillock, MD Inpatient      Home/SNF/Other Home  Chief Complaint Hematochezia  Level of Care/Admitting Diagnosis ED Disposition    ED Disposition Condition [place]: [facility] [[id number]]  Level of Care: Med-Surg [16]  Diagnosis: Colitis, infectious [370488]  Admitting Physician: Doreatha Massed  Attending Physician: Etta Quill [phone]  Estimated length of stay: past midnight tomorrow  Certification:: I certify this patient will need inpatient services for at least 2 midnights  PT Class (Do Not Modify): Inpatient [101]  PT Acc Code (Do Not Modify): Private [1]       Medical History Past Medical History:  Diagnosis Date  . Allergic rhinitis   . Anxiety   . Aortic atherosclerosis ([place]) 04/21/2017   noted on CT Chest  . Arthritis    lower back  . Asthma   . Back pain   . CAP (community acquired pneumonia)    dx 02-05-2015  . Colitis    dx 02-05-2015  . COPD with emphysema ([place])   . Depression   . Dizziness   . GERD (gastroesophageal reflux disease)   . Headache    Migraines  . History of chronic sinusitis   . History of kidney stones   . Hyperlipidemia   . Hypertension   . Irritable bowel syndrome   . Loss of weight   . Lower extremity edema, L>R 08/09/2017  . Macular degeneration, bilateral   . Oral thrush   . Pulmonary nodules    benign and stable per  CT  . Right ureteral stone   . Sensorineural hearing loss, unilateral   . Subjective tinnitus     Allergies Allergies  Allergen Reactions  . Penicillins Anaphylaxis and Other (See Comments)    Has patient had a PCN reaction causing immediate rash, facial/tongue/throat swelling, SOB or lightheadedness with hypotension: Yes Has patient had a PCN reaction causing severe rash involving mucus membranes or skin necrosis: Yes Has patient had a PCN reaction that required hospitalization Yes Has patient had a PCN reaction occurring within the last 10 years: No If all of the above answers are "NO", then may proceed with Cephalosporin use.   . Sulfa Antibiotics Anaphylaxis and Hives  . Soy Allergy Itching and Rash    IV Location/Drains/Wounds Patient Lines/Drains/Airways Status   Active Line/Drains/Airways    Name:   Placement date:   Placement time:   Site:   Days:   Peripheral IV 08/21/17 Left Antecubital   08/21/17    0342    Antecubital   less than 1   Ureteral Drain/Stent Right ureter 5 Fr.   04/28/17    1102    Right ureter   115          Labs/Imaging Results for orders placed or performed during the hospital  encounter of 08/21/17 (from the past 48 hour(s))  CBC with Differential     Status: Abnormal   Collection Time: 08/21/17  2:40 AM  Result Value Ref Range   WBC 24.6 (H) 4.0 - 10.5 K/uL   RBC 4.21 3.87 - 5.11 MIL/uL   Hemoglobin 11.5 (L) 12.0 - 15.0 g/dL   HCT 36.1 36.0 - 46.0 %   MCV 85.7 78.0 - 100.0 fL   MCH 27.3 26.0 - 34.0 pg   MCHC 31.9 30.0 - 36.0 g/dL   RDW 15.7 (H) 11.5 - 15.5 %   Platelets 282 150 - 400 K/uL   Neutrophils Relative % 90 %   Neutro Abs 22.2 (H) 1.7 - 7.7 K/uL   Lymphocytes Relative 8 %   Lymphs Abs 2.0 0.7 - 4.0 K/uL   Monocytes Relative 2 %   Monocytes Absolute 0.4 0.1 - 1.0 K/uL   Eosinophils Relative 0 %   Eosinophils Absolute 0.0 0.0 - 0.7 K/uL   Basophils Relative 0 %   Basophils Absolute 0.0 0.0 - 0.1 K/uL    Comment: Performed at  [facility], [place] [address] [place], [place] [address]  Basic metabolic panel     Status: Abnormal   Collection Time: 08/21/17  2:40 AM  Result Value Ref Range   Sodium 139 135 - 145 mmol/L   Potassium 3.6 3.5 - 5.1 mmol/L   Chloride 103 101 - 111 mmol/L   CO2 23 22 - 32 mmol/L   Glucose, Bld 111 (H) 65 - 99 mg/dL   BUN 27 (H) 6 - 20 mg/dL   Creatinine, Ser 1.58 (H) 0.44 - 1.00 mg/dL   Calcium 9.4 8.9 - 10.3 mg/dL   GFR calc non Af Amer 31 (L) >60 mL/min   GFR calc Af Amer 36 (L) >60 mL/min    Comment: (NOTE) The eGFR has been calculated using the CKD EPI equation. This calculation has not been validated in all clinical situations. eGFR's persistently <60 mL/min signify possible Chronic Kidney Disease.    Anion gap 13 5 - 15    Comment: Performed at [facility], [place] [address], [place], [place] [address]  Magnesium     Status: None   Collection Time: 08/21/17  2:40 AM  Result Value Ref Range   Magnesium 2.2 1.7 - 2.4 mg/dL    Comment: Performed at [facility], [place] [address], [place], [place] [address]  I-Stat Chem 8, ED     Status: Abnormal   Collection Time: 08/21/17  3:37 AM  Result Value Ref Range   Sodium 134 (L) 135 - 145 mmol/L   Potassium 3.4 (L) 3.5 - 5.1 mmol/L   Chloride 100 (L) 101 - 111 mmol/L   BUN 23 (H) 6 - 20 mg/dL   Creatinine, Ser 1.60 (H) 0.44 - 1.00 mg/dL   Glucose, Bld 108 (H) 65 - 99 mg/dL   Calcium, Ion 1.16 1.15 - 1.40 mmol/L   TCO2 24 22 - 32 mmol/L   Hemoglobin 11.2 (L) 12.0 - 15.0 g/dL   HCT 33.0 (L) 36.0 - 46.0 %   Ct Abdomen Pelvis Wo Contrast  Result Date: 08/21/2017 CLINICAL DATA:  Nausea and vomiting. EXAM: CT ABDOMEN AND PELVIS WITHOUT CONTRAST TECHNIQUE: Multidetector CT imaging of the abdomen and pelvis was performed following the standard protocol without IV contrast. COMPARISON:  Ultrasound 08/10/2017.  CT 04/13/2017 FINDINGS: Lower chest: Pulmonary nodules at the lung bases  are similar to prior exam. Emphysema.  Hepatobiliary: No focal hepatic lesion allowing for lack contrast. Gallbladder physiologically distended, no calcified stone. No biliary dilatation. Pancreas: Parenchymal atrophy. No ductal dilatation or inflammation. Spleen: Normal in size without focal abnormality. Adrenals/Urinary Tract: Left adrenal thickening without dominant nodule. Right adrenal gland is normal. Moderate right hydronephrosis has increased from prior CT. Ureteral stent noted on ultrasound is no longer visualized. Hydroureter to the level of the pelvic brim where there is focal soft tissue density measuring 14 x 9 mm, previously 18 x 13 mm. There is a nonobstructing 3 mm stone in the lower right kidney. 13 mm hyperdense lesion in the medial upper right kidney is new from prior CT in suggestive of hemorrhagic cyst. No left hydronephrosis or left hydroureter. Parapelvic cyst in the mid left kidney is again seen, not as well visualized without contrast. Urinary bladder is decompressed. No bladder stones. Stomach/Bowel: Small hiatal hernia. Stomach physiologically distended. No small bowel obstruction, wall thickening or inflammatory change. There is wall thickening with pericolonic stranding about the splenic flexure and descending colon. Sigmoid colon is nondistended limiting assessment. The appendix is not confidently visualized, no pericecal or right lower quadrant inflammation. Vascular/Lymphatic: Moderate aorto bi-iliac atherosclerosis. No aneurysm. No enlarged abdominal or pelvic lymph nodes. Reproductive: Uterus and bilateral adnexa are unremarkable. Other: No free air or ascites.  No intra-abdominal abscess. Musculoskeletal: There are no acute or suspicious osseous abnormalities. Degenerative change throughout spine. IMPRESSION: 1. Colitis involving the splenic flexure and descending colon, likely infectious or inflammatory. 2. Increased right hydronephrosis since 04/13/2017 CT, with focal soft tissue  prominence in the region of the distal right ureter again seen, better characterized on prior CT with contrast. 3. Hemorrhagic cysts in the right kidney. 4.  Aortic Atherosclerosis (ICD10-I70.0). Electronically Signed   By: Jeb Levering M.D.   On: 08/21/2017 05:15    Pending Labs Unresulted Labs (From admission, onward)   Start     Ordered   08/22/17 0500  CBC  Tomorrow morning,   R     08/21/17 0602   08/22/17 1660  Basic metabolic panel  Tomorrow morning,   R     08/21/17 0602   08/21/17 0551  Gastrointestinal Panel by PCR , Stool  (Gastrointestinal Panel by PCR, Stool)  Once,   R     08/21/17 0550   08/21/17 0550  Urinalysis, Routine w reflex microscopic  STAT,   R     08/21/17 0549   08/21/17 0550  C difficile quick scan w PCR reflex  (C Difficile quick screen w PCR reflex panel)  Once, for 24 hours,   R     08/21/17 0550      Vitals/Pain Today's Vitals   08/21/17 0352 08/21/17 0600 08/21/17 0609 08/21/17 0644  BP: 132/70 104/89    Pulse: 80 81    Resp: 17 16    Temp:      TempSrc:      SpO2: 97% 97%    PainSc:   7  7     Isolation Precautions Enteric precautions (UV disinfection)  Medications Medications  albuterol (PROVENTIL HFA;VENTOLIN HFA) 108 (90 Base) MCG/ACT inhaler 2 puff (has no administration in time range)  atorvastatin (LIPITOR) tablet 20 mg (has no administration in time range)  Vitamin D CAPS 2,000 Units (has no administration in time range)  fluticasone (FLONASE) 50 MCG/ACT nasal spray 2 spray (has no administration in time range)  escitalopram (LEXAPRO) tablet 20 mg (has no administration in time range)  montelukast (SINGULAIR) tablet 10  mg (has no administration in time range)  vitamin B-12 (CYANOCOBALAMIN) tablet 1,000 mcg (has no administration in time range)  pantoprazole (PROTONIX) EC tablet 40 mg (has no administration in time range)  multivitamin with minerals tablet 1 tablet (has no administration in time range)  phenazopyridine  (PYRIDIUM) tablet 200 mg (has no administration in time range)  arformoterol (BROVANA) nebulizer solution 15 mcg (has no administration in time range)  umeclidinium bromide (INCRUSE ELLIPTA) 62.5 MCG/INH 1 puff (has no administration in time range)  ciprofloxacin (CIPRO) IVPB 400 mg (400 mg Intravenous New Bag/Given 08/21/17 0643)  metroNIDAZOLE (FLAGYL) IVPB 500 mg (has no administration in time range)  predniSONE (DELTASONE) tablet 20 mg (has no administration in time range)  acetaminophen (TYLENOL) tablet 650 mg (has no administration in time range)    Or  acetaminophen (TYLENOL) suppository 650 mg (has no administration in time range)  ondansetron (ZOFRAN) tablet 4 mg (has no administration in time range)    Or  ondansetron (ZOFRAN) injection 4 mg (has no administration in time range)  0.9 %  sodium chloride infusion ( Intravenous New Bag/Given 08/21/17 0643)  ciprofloxacin (CIPRO) IVPB 400 mg (has no administration in time range)  HYDROmorphone (DILAUDID) injection 0.5-1 mg (has no administration in time range)  promethazine (PHENERGAN) injection 6.25-12.5 mg (has no administration in time range)  sodium chloride 0.9 % bolus 1,000 mL (0 mLs Intravenous Stopped 08/21/17 0523)  ondansetron (ZOFRAN) injection 4 mg (4 mg Intravenous Given 08/21/17 0352)  acetaminophen (TYLENOL) tablet 1,000 mg (1,000 mg Oral Given 08/21/17 0611)  metroNIDAZOLE (FLAGYL) IVPB 500 mg (0 mg Intravenous Stopped 08/21/17 0641)  morphine 2 MG/ML injection 2 mg (2 mg Intravenous Given 08/21/17 0528)  ondansetron (ZOFRAN) injection 4 mg (4 mg Intravenous Given 08/21/17 0527)    Mobility walks

## 2017-08-21 NOTE — H&P (Signed)
History and Physical    Akita Maxim IWL:[id number] DOB: [birth year] DOA: 08/21/2017  PCP: [place], Betty G, MD  Patient coming from: Home  I have personally briefly reviewed patient's old medical records in [place]  Chief Complaint: Diarrhea  HPI: Gina Fritz is a 77 y.o. 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  Had stent removal last week with no additional stent put back in.  They are trying to treat the pelvic tumor with daily PO prednisone.  Did have mild CAP during that admit and got discharged home with 5 day course of azithromycin on 5/10.  Patient presents to the ED today with c/o N/V/D, severe abd pain.  Suspect blood in diarrhea.  Generalized weakness.  Decreased PO intake over past couple of days.  Had been constipated for a couple of days then started having diarrhea today.  ED Course: WBC 24k, Creat 1.6 up from 0.6 at time of discharge on 5/10.  CT abd/pelvis shows colitis, also shows worsening of R hydronephrosis.  Patient put on cipro/flagyl, IVF, zofran, and morphine in ED.   Review of Systems: As per HPI otherwise 10 point review of systems negative.   Past Medical History:  Diagnosis Date  . Allergic rhinitis   . Anxiety   . Aortic atherosclerosis ([place]) 04/21/2017   noted on CT Chest  . Arthritis    lower back  . Asthma   . Back pain   . CAP (community acquired pneumonia)    dx 02-05-2015  . Colitis    dx 02-05-2015  . COPD with emphysema ([place])   . Depression   . Dizziness   . GERD (gastroesophageal reflux disease)   . Headache    Migraines  . History of chronic sinusitis   . History of kidney stones   . Hyperlipidemia   . Hypertension   . Irritable bowel syndrome   . Loss of weight   . Lower extremity edema, L>R  08/09/2017  . Macular degeneration, bilateral   . Oral thrush   . Pulmonary nodules    benign and stable per CT  . Right ureteral stone   . Sensorineural hearing loss, unilateral   . Subjective tinnitus     Past Surgical History:  Procedure Laterality Date  . CATARACT EXTRACTION Left   . CYSTO/  LEFT URETEROSCOPIC STONE EXTRACTION/ STENT PLACEMENT  08-10-2009  . CYSTOSCOPY WITH RETROGRADE PYELOGRAM, URETEROSCOPY AND STENT PLACEMENT Right 04/28/2017   Procedure: CYSTOSCOPY WITH BILATERAL RETROGRADE PYELOGRAM, RIGHT DIAGNOSTIC URETEROSCOPY,  BIOPSY AND STENT PLACEMENT;  Surgeon: Ardis Hughs, MD;  Location: [facility];  Service: Urology;  Laterality: Right;  . ECTOPIC PREGNANCY SURGERY     and Appendectomy  . NASAL SINUS SURGERY    . REMOVAL CYST RIGHT ARM  age 20  . TUBAL LIGATION    . tumor removed     "bloody tumor" as child     reports that she has been smoking cigarettes.  She has a 12.50 pack-year smoking history. She has never used smokeless tobacco. She reports that she drinks about 0.6 oz of alcohol per week. She reports that she does not use drugs.  Allergies  Allergen Reactions  . Penicillins Anaphylaxis and Other (See Comments)    Has patient had a PCN reaction causing immediate  rash, facial/tongue/throat swelling, SOB or lightheadedness with hypotension: Yes Has patient had a PCN reaction causing severe rash involving mucus membranes or skin necrosis: Yes Has patient had a PCN reaction that required hospitalization Yes Has patient had a PCN reaction occurring within the last 10 years: No If all of the above answers are "NO", then may proceed with Cephalosporin use.   . Sulfa Antibiotics Anaphylaxis and Hives  . Soy Allergy Itching and Rash    Family History  Problem Relation Age of Onset  . Congestive Heart Failure Mother   . Hearing loss Mother   . Stroke Mother        syndrome  . Hypertension Father   . Other Unknown        thyroid disorder     Prior to  Admission medications   Medication Sig Start Date End Date Taking? Authorizing Provider  acetaminophen (TYLENOL) 325 MG tablet Take 2 tablets (650 mg total) by mouth every 6 (six) hours as needed for mild pain (or Fever >/= 101). 11/20/15  Yes Reyne Dumas, MD  albuterol (PROAIR HFA) 108 (90 Base) MCG/ACT inhaler Inhale 2 puffs into the lungs every 6 (six) hours as needed for wheezing or shortness of breath. 06/23/15  Yes Chesley Mires, MD  alendronate (FOSAMAX) 70 MG tablet Take 1 tablet (70 mg total) every Sunday by mouth. 02/19/17  Yes Martinique, Betty G, MD  amLODipine (NORVASC) 5 MG tablet Take 1 tablet (5 mg total) by mouth daily. 10/19/16  Yes Martinique, Betty G, MD  atorvastatin (LIPITOR) 20 MG tablet TAKE 1 TABLET(20 MG) BY MOUTH DAILY 08/09/17  Yes Martinique, Betty G, MD  BESIVANCE 0.6 % SUSP Place 1 drop into both eyes See admin instructions. Takes the day before and the day of the dr visit 4 times a day 11/07/14  Yes [provider]  Cholecalciferol (VITAMIN D) 2000 UNITS CAPS Take 2,000 Units by mouth daily.   Yes [provider]  escitalopram (LEXAPRO) 20 MG tablet TAKE 1 TABLET(20 MG) BY MOUTH DAILY 06/23/17  Yes Martinique, Betty G, MD  fluticasone [facility]) 50 MCG/ACT nasal spray SHAKE LIQUID AND USE 2 SPRAYS IN EACH NOSTRIL DAILY 11/15/16  Yes Chesley Mires, MD  Glycopyrrolate-Formoterol (BEVESPI AEROSPHERE) 9-4.8 MCG/ACT AERO Inhale 2 puffs into the lungs 2 (two) times daily. 03/22/17  Yes Chesley Mires, MD  losartan (COZAAR) 50 MG tablet TAKE 1 TABLET(50 MG) BY MOUTH TWICE DAILY 08/15/17  Yes [place]ique, Betty G, MD  montelukast (SINGULAIR) 10 MG tablet TAKE 1 TABLET(10 MG) BY MOUTH DAILY 07/26/17  Yes Martinique, Betty G, MD  Multiple Vitamin (MULTIVITAMIN WITH MINERALS) TABS Take 1 tablet by mouth daily.   Yes [provider]  omeprazole (PRILOSEC) 20 MG capsule TAKE ONE CAPSULE BY MOUTH EVERY DAY 07/17/17  Yes [place]ique, Betty G, MD  phenazopyridine (PYRIDIUM) 200 MG tablet Take 1 tablet  (200 mg total) by mouth 3 (three) times daily as needed for pain. 04/28/17  Yes Ardis Hughs, MD  polyethylene glycol [facility] Floria Raveling) packet Take 17 g by mouth daily as needed for mild constipation.   Yes [provider]  predniSONE (DELTASONE) 20 MG tablet Take 20 mg by mouth 2 (two) times daily with a meal.   Yes [provider]  ranitidine (ZANTAC) 300 MG tablet Take 1 tablet (300 mg total) at bedtime by mouth. 02/14/17  Yes Martinique, Betty G, MD  traMADol (ULTRAM) 50 MG tablet Take 50 mg by mouth every 6 (six) hours as needed  for moderate pain.   Yes [provider]  trimethoprim-polymyxin b (POLYTRIM) ophthalmic solution Place 1 drop into both eyes See admin instructions. Pt only uses once a month after injection in the eye for 2 days every 4 hours.. 06/09/17  Yes [provider]  vitamin B-12 (CYANOCOBALAMIN) 1000 MCG tablet Take 1,000 mcg by mouth daily.   Yes [provider]  Blood Pressure Monitoring (ADULT BLOOD PRESSURE CUFF LG) KIT 1 applicator by Miscellaneous route daily. 11/11/15   [provider]    Physical Exam: Vitals:   08/21/17 0228 08/21/17 0352 08/21/17 0600  BP: (!) 179/85 132/70 104/89  Pulse: 85 80 81  Resp: (!) _0 Temp: (!) 97.2 F (36.2 C)    TempSrc: Oral    SpO2: 97% 97% 97%    Constitutional: NAD, calm, comfortable Eyes: PERRL, lids and conjunctivae normal ENMT: Mucous membranes are moist. Posterior pharynx clear of any exudate or lesions.Normal dentition.  Neck: normal, supple, no masses, no thyromegaly Respiratory: clear to auscultation bilaterally, no wheezing, no crackles. Normal respiratory effort. No accessory muscle use.  Cardiovascular: Regular rate and rhythm, no murmurs / rubs / gallops. No extremity edema. 2+ pedal pulses. No carotid bruits.  Abdomen: TTP, no rebound  Musculoskeletal: no clubbing / cyanosis. No joint deformity upper and lower extremities. Good ROM, no contractures.  Normal muscle tone.  Skin: no rashes, lesions, ulcers. No induration Neurologic: CN 2-12 grossly intact. Sensation intact, DTR normal. Strength 5/5 in all 4.  Psychiatric: Normal judgment and insight. Alert and oriented x 3. Normal mood.    Labs on Admission: I have personally reviewed following labs and imaging studies  CBC: Recent Labs  Lab 08/21/17 0240 08/21/17 0337  WBC 24.6*  --   NEUTROABS 22.2*  --   HGB 11.5* 11.2*  HCT 36.1 33.0*  MCV 85.7  --   PLT 282  --    Basic Metabolic Panel: Recent Labs  Lab 08/21/17 0240 08/21/17 0337  NA 139 134*  K 3.6 3.4*  CL 103 100*  CO2 23  --   GLUCOSE 111* 108*  BUN 27* 23*  CREATININE 1.58* 1.60*  CALCIUM 9.4  --   MG 2.2  --    GFR: Estimated Creatinine Clearance: 23.7 mL/min (A) (by C-G formula based on SCr of 1.6 mg/dL (H)). Liver Function Tests: No results for input(s): AST, ALT, ALKPHOS, BILITOT, PROT, ALBUMIN in the last 168 hours. No results for input(s): LIPASE, AMYLASE in the last 168 hours. No results for input(s): AMMONIA in the last 168 hours. Coagulation Profile: No results for input(s): INR, PROTIME in the last 168 hours. Cardiac Enzymes: No results for input(s): CKTOTAL, CKMB, CKMBINDEX, TROPONINI in the last 168 hours. BNP (last 3 results) No results for input(s): PROBNP in the last 8760 hours. HbA1C: No results for input(s): HGBA1C in the last 72 hours. CBG: No results for input(s): GLUCAP in the last 168 hours. Lipid Profile: No results for input(s): CHOL, HDL, LDLCALC, TRIG, CHOLHDL, LDLDIRECT in the last 72 hours. Thyroid Function Tests: No results for input(s): TSH, T4TOTAL, FREET4, T3FREE, THYROIDAB in the last 72 hours. Anemia Panel: No results for input(s): VITAMINB12, FOLATE, FERRITIN, TIBC, IRON, RETICCTPCT in the last 72 hours. Urine analysis:    Component Value Date/Time   COLORURINE YELLOW 08/10/2017 1211   APPEARANCEUR CLEAR 08/10/2017 1211   LABSPEC 1.004 (L) 08/10/2017 1211    PHURINE 8.0 08/10/2017 1211   GLUCOSEU NEGATIVE 08/10/2017 1211   HGBUR LARGE (  A) 08/10/2017 1211   BILIRUBINUR NEGATIVE 08/10/2017 1211   [place] 08/10/2017 1211   PROTEINUR NEGATIVE 08/10/2017 1211   UROBILINOGEN 1.0 02/05/2015 1339   NITRITE NEGATIVE 08/10/2017 1211   LEUKOCYTESUR TRACE (A) 08/10/2017 1211    Radiological Exams on Admission: Ct Abdomen Pelvis Wo Contrast  Result Date: 08/21/2017 CLINICAL DATA:  Nausea and vomiting. EXAM: CT ABDOMEN AND PELVIS WITHOUT CONTRAST TECHNIQUE: Multidetector CT imaging of the abdomen and pelvis was performed following the standard protocol without IV contrast. COMPARISON:  Ultrasound 08/10/2017.  CT 04/13/2017 FINDINGS: Lower chest: Pulmonary nodules at the lung bases are similar to prior exam. Emphysema. Hepatobiliary: No focal hepatic lesion allowing for lack contrast. Gallbladder physiologically distended, no calcified stone. No biliary dilatation. Pancreas: Parenchymal atrophy. No ductal dilatation or inflammation. Spleen: Normal in size without focal abnormality. Adrenals/Urinary Tract: Left adrenal thickening without dominant nodule. Right adrenal gland is normal. Moderate right hydronephrosis has increased from prior CT. Ureteral stent noted on ultrasound is no longer visualized. Hydroureter to the level of the pelvic brim where there is focal soft tissue density measuring 14 x 9 mm, previously 18 x 13 mm. There is a nonobstructing 3 mm stone in the lower right kidney. 13 mm hyperdense lesion in the medial upper right kidney is new from prior CT in suggestive of hemorrhagic cyst. No left hydronephrosis or left hydroureter. Parapelvic cyst in the mid left kidney is again seen, not as well visualized without contrast. Urinary bladder is decompressed. No bladder stones. Stomach/Bowel: Small hiatal hernia. Stomach physiologically distended. No small bowel obstruction, wall thickening or inflammatory change. There is wall thickening with  pericolonic stranding about the splenic flexure and descending colon. Sigmoid colon is nondistended limiting assessment. The appendix is not confidently visualized, no pericecal or right lower quadrant inflammation. Vascular/Lymphatic: Moderate aorto bi-iliac atherosclerosis. No aneurysm. No enlarged abdominal or pelvic lymph nodes. Reproductive: Uterus and bilateral adnexa are unremarkable. Other: No free air or ascites.  No intra-abdominal abscess. Musculoskeletal: There are no acute or suspicious osseous abnormalities. Degenerative change throughout spine. IMPRESSION: 1. Colitis involving the splenic flexure and descending colon, likely infectious or inflammatory. 2. Increased right hydronephrosis since 04/13/2017 CT, with focal soft tissue prominence in the region of the distal right ureter again seen, better characterized on prior CT with contrast. 3. Hemorrhagic cysts in the right kidney. 4.  Aortic Atherosclerosis (ICD10-I70.0). Electronically Signed   By: Jeb Levering M.D.   On: 08/21/2017 05:15    EKG: Independently reviewed.  Assessment/Plan Principal Problem:   Colitis, infectious Active Problems:   COPD with emphysema ([place])   Essential hypertension, benign   Hydronephrosis of right kidney   AKI (acute kidney injury) ([place])    1. Colitis - likely infectious, history concerning for possible C.Diff 1. C.Diff quickscan reflex PCR 2. GI pathogen pnl 3. Empiric cipro / flagyl for now 4. IVF: 1L bolus in ED and NS at 75 5. Zofran PRN nausea, phenergan PRN nausea if zofran not working 6. Dilaudid PRN pain 2. AKI - 1. Hold ARB 2. Suspect R obstructive uropathy to blame with possibly some pre-renal component 3. UA pending 3. R hydro due to compression of R ureter- 1. Will leave her on prednisone started by urology for the moment. 2. Call urology in AM, PT of Dr. Carlton Adam 4. HTN - holding home BP meds for now  DVT prophylaxis: SCDs - due to report of blood in diarrhea Code  Status: Full Family Communication: Husband at bedside Disposition Plan: Home after  admit Consults called: None, call urology in AM Admission status: Admit to inpatient   [place], [place] [facility] Pager [phone]  If 7AM-7PM, please contact day team taking care of patient [email or web address] Password [facility]  08/21/2017, 6:31 AM

## 2017-08-21 NOTE — Progress Notes (Signed)
Initial Nutrition Assessment  DOCUMENTATION CODES:   Severe malnutrition in context of chronic illness  INTERVENTION:    Monitor for diet advancement/toleration  Will provide supplements upon pt's request  NUTRITION DIAGNOSIS:   Severe Malnutrition related to chronic illness(COPD/pelvic mass) as evidenced by energy intake < or equal to 75% for > or equal to 1 month, moderate fat depletion, moderate muscle depletion, percent weight loss, severe muscle depletion.  GOAL:   Patient will meet greater than or equal to 90% of their needs  MONITOR:   PO intake, Supplement acceptance, Diet advancement, Weight trends, Labs  REASON FOR ASSESSMENT:   Malnutrition Screening Tool    ASSESSMENT:   Patient with PMH significant for colitis, COPD, HLD, GERD, and osteoporosis. Recently admitted 5/9  after undergoing right diagnostic ureteroscopy and retrograde pyelogram for right-sided hydroureteronephrosis. Presents this admission with colitis concerning for C.Diff.    Spoke with pt at bedside. Appetite has remained poor since being discharged 5/10. States she would pick food throughout the day but could not tolerate meals. Diarrhea started 5 days ago and her appetite decreased even further. Pt is currently on a clear liquid diet but refuses to eat today. States "we can discuss things tomorrow, but I'm taking a rest today." Reiterated the importance of protein intake for preservation of lean body mass as her weight continues to decline causing increased falls. Will provide supplements upon pt request.   Pt continues to endorse a UBW of 130 lb. States she has lost 10 lb since her last admission. RD obtained bed weight of 103 lb which shows a total weight loss of 18.9% since 02/14/17 (significant for time frame). Nutrition-Focused physical exam completed.   Medications reviewed and include: Vit D, MVI with minerals, prednisone, Vit B12, IV abx Labs reviewed: Na 134 (L) K 3.4 (L)   NUTRITION -  FOCUSED PHYSICAL EXAM:    Most Recent Value  Orbital Region  No depletion  Upper Arm Region  Moderate depletion  Thoracic and Lumbar Region  Unable to assess  Buccal Region  Mild depletion  Temple Region  Moderate depletion  Clavicle Bone Region  Moderate depletion  Clavicle and Acromion Bone Region  Moderate depletion  Scapular Bone Region  Unable to assess  Dorsal Hand  Moderate depletion  Patellar Region  Severe depletion  Anterior Thigh Region  Severe depletion  Posterior Calf Region  Severe depletion  Edema (RD Assessment)  None  Hair  Reviewed  Eyes  Reviewed  Mouth  Reviewed  Skin  Reviewed  Nails  Reviewed     Diet Order:   Diet Order           Diet clear liquid Room service appropriate? Yes; Fluid consistency: Thin  Diet effective now          EDUCATION NEEDS:   Education needs have been addressed  Skin:  Skin Assessment: Reviewed RN Assessment  Last BM:  08/21/17  Height:   Ht Readings from Last 1 Encounters:  08/21/17 5' 2.01" (1.575 m)    Weight:   Wt Readings from Last 1 Encounters:  08/21/17 103 lb (46.7 kg)    Ideal Body Weight:  50 kg  BMI:  Body mass index is 18.83 kg/m.  Estimated Nutritional Needs:   Kcal:  1550-1750 kcal  Protein:  80-90 g  Fluid:  >1.5 L/day    Mariana Single RD, LDN Clinical Nutrition Pager # [phone]

## 2017-08-21 NOTE — Telephone Encounter (Signed)
Tried calling patient to let her know that xray order has been placed, no answer. Patient currently at [facility].

## 2017-08-21 NOTE — Consult Note (Signed)
H&P Physician requesting consult: Jennette Kettle, MD  Chief Complaint: right hydronephrosis, acute renal insufficiency  History of Present Illness: 77 year old female who is a patient of Dr. 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  She has undergone a diagnostic ureteroscopy with biopsy that was negative.her hydronephrosis was initially managed with a ureteral stent.  She had severe pain and colic with this and therefore the stent was removed to see how she would do.  Over the weekend, the patient developed abdominal pain as well as diarrhea.  She presented to the emergency department with a white blood cell count 24 and a creatinine of 1.6 which is up from a baseline of 0.8.  She denies any significant flank pain.  She does have some lower back pain and abdominal pain with diarrhea.  CT scan of the abdomen and pelvis was performed in the emergency department that revealed interval increase in right-sided moderate hydronephrosis down to the level of the soft tissue density compressing the ureter.  It also revealed evidence of likely colitis.  At the last visit with Dr. Louis Meckel, he discussed a potential option of nephrostomy tube placement.  Today, the patient is adamantly against this.  She also does not want a ureteral stent currently.  She did not tolerate it well last time it was placed  Past Medical History:  Diagnosis Date  . Allergic rhinitis   . Anxiety   . Aortic atherosclerosis ([place]) 04/21/2017   noted on CT Chest  . Arthritis    lower back  . Asthma   . Back pain   . CAP (community acquired pneumonia)    dx 02-05-2015  . Colitis    dx 02-05-2015  . COPD with emphysema ([place])   . Depression   . Dizziness   . GERD (gastroesophageal reflux disease)   . Headache    Migraines  . History of chronic sinusitis   . History of kidney stones   . Hyperlipidemia   . Hypertension   . Irritable bowel syndrome   . Loss of  weight   . Lower extremity edema, L>R 08/09/2017  . Macular degeneration, bilateral   . Oral thrush   . Pulmonary nodules    benign and stable per CT  . Right ureteral stone   . Sensorineural hearing loss, unilateral   . Subjective tinnitus    Past Surgical History:  Procedure Laterality Date  . CATARACT EXTRACTION Left   . CYSTO/  LEFT URETEROSCOPIC STONE EXTRACTION/ STENT PLACEMENT  08-10-2009  . CYSTOSCOPY WITH RETROGRADE PYELOGRAM, URETEROSCOPY AND STENT PLACEMENT Right 04/28/2017   Procedure: CYSTOSCOPY WITH BILATERAL RETROGRADE PYELOGRAM, RIGHT DIAGNOSTIC URETEROSCOPY,  BIOPSY AND STENT PLACEMENT;  Surgeon: Ardis Hughs, MD;  Location: [facility];  Service: Urology;  Laterality: Right;  . ECTOPIC PREGNANCY SURGERY     and Appendectomy  . NASAL SINUS SURGERY    . REMOVAL CYST RIGHT ARM  age 83  . TUBAL LIGATION    . 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 acetaminophen (TYLENOL) 325 MG tablet Take 2 tablets (650 mg total) by mouth every 6 (six) hours as needed for mild pain (or Fever >/= 101). 30 tablet 1 08/20/2017 at Unknown time  . albuterol (PROAIR HFA) 108 (90 Base) MCG/ACT inhaler Inhale 2 puffs into the lungs every 6 (six) hours  as needed for wheezing or shortness of breath. 1 Inhaler 2 Past Week at Unknown time  . alendronate (FOSAMAX) 70 MG tablet Take 1 tablet (70 mg total) every Sunday by mouth. 13 tablet 3 08/20/2017 at Unknown time  . amLODipine (NORVASC) 5 MG tablet Take 1 tablet (5 mg total) by mouth daily. 90 tablet 1 08/20/2017 at Unknown time  . atorvastatin (LIPITOR) 20 MG tablet TAKE 1 TABLET(20 MG) BY MOUTH DAILY 90 tablet 0 08/20/2017 at Unknown time  . BESIVANCE 0.6 % SUSP Place 1 drop into both eyes See admin instructions. Takes the day before and the day of the dr visit 4 times a day   Past Month at Unknown time  . Cholecalciferol (VITAMIN D) 2000 UNITS CAPS  Take 2,000 Units by mouth daily.   08/20/2017 at Unknown time  . escitalopram (LEXAPRO) 20 MG tablet TAKE 1 TABLET(20 MG) BY MOUTH DAILY 90 tablet 1 Past Week at Unknown time  . fluticasone (FLONASE) 50 MCG/ACT nasal spray SHAKE LIQUID AND USE 2 SPRAYS IN EACH NOSTRIL DAILY 16 g 5 08/20/2017 at Unknown time  . Glycopyrrolate-Formoterol (BEVESPI AEROSPHERE) 9-4.8 MCG/ACT AERO Inhale 2 puffs into the lungs 2 (two) times daily. 1 Inhaler 0 Past Week at Unknown time  . losartan (COZAAR) 50 MG tablet TAKE 1 TABLET(50 MG) BY MOUTH TWICE DAILY 180 tablet 0 08/20/2017 at Unknown time  . montelukast (SINGULAIR) 10 MG tablet TAKE 1 TABLET(10 MG) BY MOUTH DAILY 90 tablet 0 Past Week at Unknown time  . Multiple Vitamin (MULTIVITAMIN WITH MINERALS) TABS Take 1 tablet by mouth daily.   Past Month at Unknown time  . omeprazole (PRILOSEC) 20 MG capsule TAKE ONE CAPSULE BY MOUTH EVERY DAY 90 capsule 0 08/20/2017 at Unknown time  . phenazopyridine (PYRIDIUM) 200 MG tablet Take 1 tablet (200 mg total) by mouth 3 (three) times daily as needed for pain. 10 tablet 0 Past Week at Unknown time  . polyethylene glycol (MIRALAX / GLYCOLAX) packet Take 17 g by mouth daily as needed for mild constipation.   Past Week at Unknown time  . predniSONE (DELTASONE) 20 MG tablet Take 20 mg by mouth 2 (two) times daily with a meal.   Past Week at Unknown time  . ranitidine (ZANTAC) 300 MG tablet Take 1 tablet (300 mg total) at bedtime by mouth. 90 tablet 3 Past Week at Unknown time  . traMADol (ULTRAM) 50 MG tablet Take 50 mg by mouth every 6 (six) hours as needed for moderate pain.   Past Week at Unknown time  . trimethoprim-polymyxin b (POLYTRIM) ophthalmic solution Place 1 drop into both eyes See admin instructions. Pt only uses once a month after injection in the eye for 2 days every 4 hours..   Past Month at Unknown time  . vitamin B-12 (CYANOCOBALAMIN) 1000 MCG tablet Take 1,000 mcg by mouth daily.   Past Month at Unknown time  . Blood  Pressure Monitoring (ADULT BLOOD PRESSURE CUFF LG) KIT 1 applicator by Miscellaneous route daily.   Taking   Allergies:  Allergies  Allergen Reactions  . Penicillins Anaphylaxis and Other (See Comments)    Has patient had a PCN reaction causing immediate rash, facial/tongue/throat swelling, SOB or lightheadedness with hypotension: Yes Has patient had a PCN reaction causing severe rash involving mucus membranes or skin necrosis: Yes Has patient had a PCN reaction that required hospitalization Yes Has patient had a PCN reaction occurring within the last 10 years: No If all of the above answers  are "NO", then may proceed with Cephalosporin use.   . Sulfa Antibiotics Anaphylaxis and Hives  . Soy Allergy Itching and Rash    Family History  Problem Relation Age of Onset  . Congestive Heart Failure Mother   . Hearing loss Mother   . Stroke Mother        syndrome  . Hypertension Father   . Other Unknown        thyroid disorder   Social History:  reports that she has been smoking cigarettes.  She has a 12.50 pack-year smoking history. She has never used smokeless tobacco. She reports that she drinks about 0.6 oz of alcohol per week. She reports that she does not use drugs.  ROS: A complete review of systems was performed.  All systems are negative except for pertinent findings as noted. ROS   Physical Exam:  Vital signs in last 24 hours: Temp:  [97.2 F (36.2 C)-99 F (37.2 C)] 99 F (37.2 C) (05/20 0845) Pulse Rate:  [80-142] 142 (05/20 0845) Resp:  [16-22] 20 (05/20 0845) BP: (104-179)/(55-106) 137/106 (05/20 0845) SpO2:  [89 %-97 %] 89 % (05/20 0845) General:  Alert and oriented, No acute distress HEENT: Normocephalic, atraumatic Neck: No JVD or lymphadenopathy Cardiovascular: Regular rate and rhythm Lungs: Regular rate and effort Abdomen: Soft, mildly tender to palpation, nondistended, no abdominal masses Back: No CVA tenderness Extremities: No edema Neurologic: Grossly  intact  Laboratory Data:  Results for orders placed or performed during the hospital encounter of 08/21/17 (from the past 24 hour(s))  CBC with Differential     Status: Abnormal   Collection Time: 08/21/17  2:40 AM  Result Value Ref Range   WBC 24.6 (H) 4.0 - 10.5 K/uL   RBC 4.21 3.87 - 5.11 MIL/uL   Hemoglobin 11.5 (L) 12.0 - 15.0 g/dL   HCT 36.1 36.0 - 46.0 %   MCV 85.7 78.0 - 100.0 fL   MCH 27.3 26.0 - 34.0 pg   MCHC 31.9 30.0 - 36.0 g/dL   RDW 15.7 (H) 11.5 - 15.5 %   Platelets 282 150 - 400 K/uL   Neutrophils Relative % 90 %   Neutro Abs 22.2 (H) 1.7 - 7.7 K/uL   Lymphocytes Relative 8 %   Lymphs Abs 2.0 0.7 - 4.0 K/uL   Monocytes Relative 2 %   Monocytes Absolute 0.4 0.1 - 1.0 K/uL   Eosinophils Relative 0 %   Eosinophils Absolute 0.0 0.0 - 0.7 K/uL   Basophils Relative 0 %   Basophils Absolute 0.0 0.0 - 0.1 K/uL  Basic metabolic panel     Status: Abnormal   Collection Time: 08/21/17  2:40 AM  Result Value Ref Range   Sodium 139 135 - 145 mmol/L   Potassium 3.6 3.5 - 5.1 mmol/L   Chloride 103 101 - 111 mmol/L   CO2 23 22 - 32 mmol/L   Glucose, Bld 111 (H) 65 - 99 mg/dL   BUN 27 (H) 6 - 20 mg/dL   Creatinine, Ser 1.58 (H) 0.44 - 1.00 mg/dL   Calcium 9.4 8.9 - 10.3 mg/dL   GFR calc non Af Amer 31 (L) >60 mL/min   GFR calc Af Amer 36 (L) >60 mL/min   Anion gap 13 5 - 15  Magnesium     Status: None   Collection Time: 08/21/17  2:40 AM  Result Value Ref Range   Magnesium 2.2 1.7 - 2.4 mg/dL  I-Stat Chem 8, ED     Status:  Abnormal   Collection Time: 08/21/17  3:37 AM  Result Value Ref Range   Sodium 134 (L) 135 - 145 mmol/L   Potassium 3.4 (L) 3.5 - 5.1 mmol/L   Chloride 100 (L) 101 - 111 mmol/L   BUN 23 (H) 6 - 20 mg/dL   Creatinine, Ser 1.60 (H) 0.44 - 1.00 mg/dL   Glucose, Bld 108 (H) 65 - 99 mg/dL   Calcium, Ion 1.16 1.15 - 1.40 mmol/L   TCO2 24 22 - 32 mmol/L   Hemoglobin 11.2 (L) 12.0 - 15.0 g/dL   HCT 33.0 (L) 36.0 - 46.0 %  POC occult blood, ED  Provider will collect     Status: None   Collection Time: 08/21/17  8:06 AM  Result Value Ref Range   Fecal Occult Bld NEGATIVE NEGATIVE   No results found for this or any previous visit (from the past 240 hour(s)). Creatinine: Recent Labs    08/21/17 0240 08/21/17 0337  CREATININE 1.58* 1.60*    Impression/Assessment:  Acute renal insufficiency Hydronephrosis secondary to extrinsic compression Colitis  Plan:  Agree with conservative management for now.  We will continue to follow her renal function.  Currently, she is adamantly against any intervention and wants to watch her renal function.  She is asymptomatic on the right side and I suspect her abdominal pain is secondary to the colitis.  No plan for any intervention today.  Marton Redwood, III 08/21/2017, 10:40 AM

## 2017-08-21 NOTE — Progress Notes (Addendum)
PROGRESS NOTE   Gina Fritz  DJS:[id number]    DOB: 03/03/1941    DOA: 08/21/2017  PCP: [place], Betty G, MD   I have briefly reviewed patients previous medical records in [facility].  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  Retroperitoneal fibrosis, also treated for CAP with 5-day course of azithromycin on 5/10, presented to ED with complaints of acute on chronic abdominal pain, severe diarrhea, nausea, vomiting and some blood in stools with reduced oral intake.  Admitted for suspected infectious colitis, acute kidney injury and right hydronephrosis.  Urology consulted.   Assessment & Plan:   Principal Problem:   Colitis, infectious Active Problems:   COPD with emphysema ([place])   Essential hypertension, benign   Hydronephrosis of right kidney   AKI (acute kidney injury) ([place])   Protein-calorie malnutrition, severe   Infectious colitis: CT abdomen 5/20: Colitis of splenic flexure and descending colon.  Unclear etiology.  Could be acute bacterial versus viral versus rule out C. difficile (has risk factors).  Marked leukocytosis could be due to C. difficile but also may be from steroids.  Treating supportively with bowel rest/liquid diet, IV fluids, empirically started on IV Cipro and Flagyl.  Patient reports improvement and no BM since about 3 AM today.  Unless she has further diarrhea, hold off on starting oral vancomycin until confirmed by testing.  Also follow GI panel PCR.  Right hydronephrosis, worsening: Suspected due to extrinsic compression from soft tissue density in RLQ.  Urology was consulted.  Patient  adamantly against any intervention at this time.  Follow renal functions.  RLQ lesion: As per urology, patient has a small poorly defined lesion in the right pelvis overlying the iliac vessels and encasing the right ureter creating hydronephrosis.  She is being managed for retroperitoneal fibrosis and getting prednisone for same.  Acute kidney injury: Could be due to dehydration from GI losses, ARB and right hydronephrosis.  IV fluids and follow BMP.  Expect recovery.  Baseline creatinine normal/0.68  Essential hypertension: Controlled.  Holding antihypertensives for now.  COPD: Stable without clinical bronchospasm.  Continue Singulair.  Anxiety and depression: Continue Lexapro and Umeclidinium  GERD: PPI.  Hyperlipidemia: Statins.  Normocytic anemia: Stable.  Likely anemia of chronic disease follow CBCs daily.  Leukocytosis: Possibly from steroids but could be infectious too.  Recent CAP/RLL: Treated.  Severe protein calorie malnutrition: Management per dietitian input.     DVT prophylaxis: SCDs. Code Status: Full Family Communication: Discussed in detail with patient's spouse at bedside.  Updated care and answered questions. Disposition: DC home pending clinical improvement.   Consultants:  Urology.  Procedures:  None  Antimicrobials:  IV Cipro and Flagyl 5/20 >   Subjective: Patient interviewed and examined with spouse at bedside.  Seen this morning.  Reports chronic ongoing abdominal pain since January 2019.  Reports 1 day of constipation day prior to admission followed by acute onset of diarrhea with multiple watery stools and small amount of blood without clots.  Associated abdominal pain and nonbloody emesis.  Denies sick contacts with similar complaints or eating out.  Feels better since this morning, no BM since about 3 AM, no nausea  vomiting, abdominal pain decreased, does not want to advance diet today and adamant about not getting any renal stents.  ROS:  Above.  Objective:  Vitals:   08/21/17 1108 08/21/17 1116 08/21/17 1233 08/21/17 1522  BP:  136/90  (!) 161/74  Pulse:  98  77  Resp:  20  (!) 24  Temp:  99 F (37.2 C)  98.7 F (37.1 C)  TempSrc:  Oral  Oral  SpO2: 95%   90%  Weight:  51.3 kg (113 lb) 46.7 kg (103 lb)   Height:  5' 2.01" (1.575 m)      Examination:  General exam: Elderly female, small built, frail and chronically ill looking lying comfortably propped up in bed.  Oral mucosa slightly dry. Respiratory system: Slightly diminished in the bases but otherwise clear to auscultation. Respiratory effort normal. Cardiovascular system: S1 & S2 heard, RRR. No JVD, murmurs, rubs, gallops or clicks. No pedal edema. Gastrointestinal system: Abdomen is mildly distended, mild diffuse tenderness without rigidity, guarding or rebound.  Soft. No organomegaly or masses felt. Normal bowel sounds heard. Central nervous system: Alert and oriented. No focal neurological deficits. Extremities: Symmetric 5 x 5 power. Skin: No rashes, lesions or ulcers Psychiatry: Judgement and insight appear normal. Mood & affect anxious.     Data Reviewed: I have personally reviewed following labs and imaging studies  CBC: Recent Labs  Lab 08/21/17 0240 08/21/17 0337  WBC 24.6*  --   NEUTROABS 22.2*  --   HGB 11.5* 11.2*  HCT 36.1 33.0*  MCV 85.7  --   PLT 282  --    Basic Metabolic Panel: Recent Labs  Lab 08/21/17 0240 08/21/17 0337  NA 139 134*  K 3.6 3.4*  CL 103 100*  CO2 23  --   GLUCOSE 111* 108*  BUN 27* 23*  CREATININE 1.58* 1.60*  CALCIUM 9.4  --   MG 2.2  --    Liver Function Tests: No results for input(s): AST, ALT, ALKPHOS, BILITOT, PROT, ALBUMIN in the last 168 hours.  No results found for this or any previous visit (from the past 240 hour(s)).       Radiology Studies: Ct Abdomen Pelvis Wo Contrast  Result Date: 08/21/2017 CLINICAL DATA:  Nausea and vomiting. EXAM: CT ABDOMEN AND PELVIS WITHOUT CONTRAST  TECHNIQUE: Multidetector CT imaging of the abdomen and pelvis was performed following the standard protocol without IV contrast. COMPARISON:  Ultrasound 08/10/2017.  CT 04/13/2017 FINDINGS: Lower chest: Pulmonary nodules at the lung bases are similar to prior exam. Emphysema. Hepatobiliary: No focal hepatic lesion allowing for lack contrast. Gallbladder physiologically distended, no calcified stone. No biliary dilatation. Pancreas: Parenchymal atrophy. No ductal dilatation or inflammation. Spleen: Normal in size without focal abnormality. Adrenals/Urinary Tract: Left adrenal thickening without dominant nodule. Right adrenal gland is normal. Moderate right hydronephrosis has increased from prior CT. Ureteral stent noted on ultrasound is no longer visualized. Hydroureter to the level of the pelvic brim where there is focal soft tissue density measuring 14 x 9 mm, previously 18 x 13 mm. There is a nonobstructing 3 mm stone in the lower right kidney. 13 mm hyperdense lesion in the medial upper right kidney is new from prior CT in suggestive of hemorrhagic cyst. No left hydronephrosis or left hydroureter. Parapelvic cyst in the mid left kidney is again seen, not as well visualized without contrast. Urinary bladder is decompressed. No bladder stones. Stomach/Bowel: Small hiatal hernia. Stomach physiologically distended. No small bowel obstruction, wall thickening or inflammatory  change. There is wall thickening with pericolonic stranding about the splenic flexure and descending colon. Sigmoid colon is nondistended limiting assessment. The appendix is not confidently visualized, no pericecal or right lower quadrant inflammation. Vascular/Lymphatic: Moderate aorto bi-iliac atherosclerosis. No aneurysm. No enlarged abdominal or pelvic lymph nodes. Reproductive: Uterus and bilateral adnexa are unremarkable. Other: No free air or ascites.  No intra-abdominal abscess. Musculoskeletal: There are no acute or suspicious osseous  abnormalities. Degenerative change throughout spine. IMPRESSION: 1. Colitis involving the splenic flexure and descending colon, likely infectious or inflammatory. 2. Increased right hydronephrosis since 04/13/2017 CT, with focal soft tissue prominence in the region of the distal right ureter again seen, better characterized on prior CT with contrast. 3. Hemorrhagic cysts in the right kidney. 4.  Aortic Atherosclerosis (ICD10-I70.0). Electronically Signed   By: Jeb Levering M.D.   On: 08/21/2017 05:15        Scheduled Meds: . arformoterol  15 mcg Nebulization BID  . atorvastatin  20 mg Oral q1800  . cholecalciferol  2,000 Units Oral Daily  . escitalopram  20 mg Oral Daily  . fluticasone  2 spray Each Nare Daily  . montelukast  10 mg Oral QHS  . multivitamin with minerals  1 tablet Oral Daily  . pantoprazole  40 mg Oral Daily  . predniSONE  20 mg Oral BID WC  . umeclidinium bromide  1 puff Inhalation Daily  . vitamin B-12  1,000 mcg Oral Daily   Continuous Infusions: . sodium chloride 75 mL/hr at 08/21/17 0643  . [START ON 08/22/2017] ciprofloxacin    . metronidazole Stopped (08/21/17 1436)     LOS: 0 days     Vernell Leep, MD, FACP, [facility]. [facility] Pager [phone]  If 7PM-7AM, please contact night-coverage [email or web address] Password [facility] 08/21/2017, 3:36 PM

## 2017-08-21 NOTE — ED Notes (Signed)
Pt contaminated previous specimen for stool and/or urine. Will obtain specimen when able.

## 2017-08-21 NOTE — ED Triage Notes (Signed)
Pt is presented by EMS from home, c/o diarrhea with suspicion of blood in the stool and associated weakness.

## 2017-08-21 NOTE — ED Provider Notes (Signed)
[facility] DEPT Provider Note   CSN: [id number] Arrival date & time: 08/21/17  0213     History   Chief Complaint Chief Complaint  Patient presents with  . Diarrhea    HPI Gina Fritz is a 77 y.o. female.  77 yo F with a chief complaint of a large bowel movement.  Described as diarrhea this morning.  There was some darkness to it that they thought was blood.  They also felt that she has been more weak this morning than normal.  Per the husband she has been having some encephalopathy with her prednisone use.  She is on prednisone for a ureteral tumor, she is on her seventh week.  She has been constipated for the past couple days and then had a large bowel movement this morning.  Has been having decreased oral intake for the past few days.  No fevers or chills no vomiting or diarrhea.  She had some mild nausea earlier.  The history is provided by the patient and the spouse.  Diarrhea   Pertinent negatives include no abdominal pain, no vomiting, no chills, no headaches, no arthralgias and no myalgias.  Illness  This is a new problem. The current episode started 3 to 5 hours ago. The problem occurs constantly. The problem has not changed since onset.Pertinent negatives include no chest pain, no abdominal pain, no headaches and no shortness of breath. Nothing aggravates the symptoms. Nothing relieves the symptoms. She has tried nothing for the symptoms. The treatment provided no relief.    Past Medical History:  Diagnosis Date  . Allergic rhinitis   . Anxiety   . Aortic atherosclerosis ([place]) 04/21/2017   noted on CT Chest  . Arthritis    lower back  . Asthma   . Back pain   . CAP (community acquired pneumonia)    dx 02-05-2015  . Colitis    dx 02-05-2015  . COPD with emphysema ([place])   . Depression   . Dizziness   . GERD (gastroesophageal reflux disease)   . Headache    Migraines  . History of chronic sinusitis   . History of kidney  stones   . Hyperlipidemia   . Hypertension   . Irritable bowel syndrome   . Loss of weight   . Lower extremity edema, L>R 08/09/2017  . Macular degeneration, bilateral   . Oral thrush   . Pulmonary nodules    benign and stable per CT  . Right ureteral stone   . Sensorineural hearing loss, unilateral   . Subjective tinnitus     Patient Active Problem List   Diagnosis Date Noted  . Chest pain 08/09/2017  . 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 Lower extremity edema, L>R 08/09/2017  . Macular degeneration, wet ([place]) 02/14/2017  . GERD (gastroesophageal reflux disease) 10/19/2016  . Rectal sphincter spasm 05/26/2016  . Overactive bladder-urinary frequency 05/26/2016  . Bilateral low back pain 04/27/2016  . Generalized anxiety disorder 04/27/2016  . Hyperlipidemia 02/24/2016  . Osteoporosis 02/24/2016  . Essential hypertension, benign 12/18/2015  . Hypertensive urgency 11/14/2015  . 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 COPD with asthma ([place]) 05/22/2015  . Malnutrition of moderate degree 02/09/2015  . Abdominal pain 02/05/2015  . Kidney stone on right side 02/05/2015  . Colitis 02/05/2015  . Unstable gait 04/22/2013    Past Surgical History:  Procedure Laterality Date  . CATARACT EXTRACTION Left   . CYSTO/  LEFT URETEROSCOPIC STONE EXTRACTION/ STENT PLACEMENT  08-10-2009  . CYSTOSCOPY WITH RETROGRADE PYELOGRAM, URETEROSCOPY AND STENT PLACEMENT Right 04/28/2017   Procedure: CYSTOSCOPY WITH BILATERAL RETROGRADE PYELOGRAM, RIGHT DIAGNOSTIC URETEROSCOPY,  BIOPSY AND STENT PLACEMENT;  Surgeon: Ardis Hughs, MD;  Location: [facility];  Service: Urology;  Laterality: Right;  . ECTOPIC PREGNANCY SURGERY     and Appendectomy  . NASAL SINUS SURGERY    . REMOVAL CYST RIGHT ARM  age 52  . TUBAL LIGATION    . tumor removed     "bloody tumor" as child     OB History   None      Home Medications    Prior to Admission medications   Medication Sig Start Date End  Date Taking? Authorizing Provider  acetaminophen (TYLENOL) 325 MG tablet Take 2 tablets (650 mg total) by mouth every 6 (six) hours as needed for mild pain (or Fever >/= 101). 11/20/15  Yes Reyne Dumas, MD  albuterol (PROAIR HFA) 108 (90 Base) MCG/ACT inhaler Inhale 2 puffs into the lungs every 6 (six) hours as needed for wheezing or shortness of breath. 06/23/15  Yes Chesley Mires, MD  alendronate (FOSAMAX) 70 MG tablet Take 1 tablet (70 mg total) every Sunday by mouth. 02/19/17  Yes [place], Betty G, MD  amLODipine (NORVASC) 5 MG tablet Take 1 tablet (5 mg total) by mouth daily. 10/19/16  Yes [place], Betty G, MD  atorvastatin (LIPITOR) 20 MG tablet TAKE 1 TABLET(20 MG) BY MOUTH DAILY 08/09/17  Yes [place], Betty G, MD  BESIVANCE 0.6 % SUSP Place 1 drop into both eyes See admin instructions. Takes the day before and the day of the dr visit 4 times a day 11/07/14  Yes [provider]  Cholecalciferol (VITAMIN D) 2000 UNITS CAPS Take 2,000 Units by mouth daily.   Yes [provider]  escitalopram (LEXAPRO) 20 MG tablet TAKE 1 TABLET(20 MG) BY MOUTH DAILY 06/23/17  Yes [place], Betty G, MD  fluticasone [facility]) 50 MCG/ACT nasal spray SHAKE LIQUID AND USE 2 SPRAYS IN EACH NOSTRIL DAILY 11/15/16  Yes Chesley Mires, MD  Glycopyrrolate-Formoterol (BEVESPI AEROSPHERE) 9-4.8 MCG/ACT AERO Inhale 2 puffs into the lungs 2 (two) times daily. 03/22/17  Yes Chesley Mires, MD  losartan (COZAAR) 50 MG tablet TAKE 1 TABLET(50 MG) BY MOUTH TWICE DAILY 08/15/17  Yes [place], Betty G, MD  montelukast (SINGULAIR) 10 MG tablet TAKE 1 TABLET(10 MG) BY MOUTH DAILY 07/26/17  Yes [place], Betty G, MD  Multiple Vitamin (MULTIVITAMIN WITH MINERALS) TABS Take 1 tablet by mouth daily.   Yes [provider]  omeprazole (PRILOSEC) 20 MG capsule TAKE ONE CAPSULE BY MOUTH EVERY DAY 07/17/17  Yes [place], Betty G, MD  phenazopyridine (PYRIDIUM) 200 MG tablet Take 1 tablet (200 mg total) by mouth 3 (three) times daily as needed  for pain. 04/28/17  Yes Ardis Hughs, MD  polyethylene glycol [facility] Floria Raveling) packet Take 17 g by mouth daily as needed for mild constipation.   Yes [provider]  predniSONE (DELTASONE) 20 MG tablet Take 20 mg by mouth 2 (two) times daily with a meal.   Yes [provider]  ranitidine (ZANTAC) 300 MG tablet Take 1 tablet (300 mg total) at bedtime by mouth. 02/14/17  Yes [place], Betty G, MD  traMADol (ULTRAM) 50 MG tablet Take 50 mg by mouth every 6 (six) hours as needed for moderate pain.   Yes [provider]  trimethoprim-polymyxin b (POLYTRIM) ophthalmic solution Place 1 drop into both eyes See admin instructions. Pt only  uses once a month after injection in the eye for 2 days every 4 hours.. 06/09/17  Yes [provider]  vitamin B-12 (CYANOCOBALAMIN) 1000 MCG tablet Take 1,000 mcg by mouth daily.   Yes [provider]  [place] alk-PHENObarbital (DONNATAL) 16.2 MG tablet Take 1 tablet by mouth daily as needed (ibs). No more than 2 tabs per month. Patient not taking: Reported on 08/21/2017 05/26/16   [place], Betty G, MD  Blood Pressure Monitoring (ADULT BLOOD PRESSURE CUFF LG) KIT 1 applicator by Miscellaneous route daily. 11/11/15   [provider]  HYDROcodone-acetaminophen (NORCO/VICODIN) 5-325 MG tablet Take 1-2 tablets by mouth every 4 (four) hours as needed for moderate pain. Patient not taking: Reported on 08/21/2017 08/11/17   Ardis Hughs, MD  irbesartan (AVAPRO) 75 MG tablet TAKE 1 TABLET(75 MG) BY MOUTH DAILY Patient not taking: Reported on 08/21/2017 08/04/17   [place], Betty G, MD  nystatin (MYCOSTATIN) 100000 UNIT/ML suspension SWISH AND SWALLOW 5 MILLITERS FOUR TIMES A DAY FOR 10 DAYS Patient not taking: Reported on 08/21/2017 04/21/17   [place], Betty G, MD  VESICARE 10 MG tablet TAKE 1 TABLET(10 MG) BY MOUTH DAILY Patient not taking: Reported on 08/21/2017 05/15/17   [place], Betty G, MD    Family History Family  History  Problem Relation Age of Onset  . Congestive Heart Failure Mother   . Hearing loss Mother   . Stroke Mother        syndrome  . Hypertension Father   . Other Unknown        thyroid disorder    Social History Social History   Tobacco Use  . Smoking status: Current Every Day Smoker    Packs/day: 0.50    Years: 25.00    Pack years: 12.50    Types: Cigarettes  . Smokeless tobacco: Never Used  . Tobacco comment: Thinks her smoking was more than 25 years abut is not exact   Substance Use Topics  . Alcohol use: Yes    Alcohol/week: 0.6 oz    Types: 1 Glasses of wine per week    Comment: One drink a week.  . Drug use: No     Allergies   Penicillins; Sulfa antibiotics; and Soy allergy   Review of Systems Review of Systems  Constitutional: Negative for chills and fever.  HENT: Negative for congestion and rhinorrhea.   Eyes: Negative for redness and visual disturbance.  Respiratory: Negative for shortness of breath and wheezing.   Cardiovascular: Negative for chest pain and palpitations.  Gastrointestinal: Positive for diarrhea. Negative for abdominal pain, nausea and vomiting.  Genitourinary: Negative for dysuria and urgency.  Musculoskeletal: Negative for arthralgias and myalgias.  Skin: Negative for pallor and wound.  Neurological: Negative for dizziness and headaches.     Physical Exam Updated Vital Signs BP 132/70   Pulse 80   Temp (!) 97.2 F (36.2 C) (Oral)   Resp 17   SpO2 97%   Physical Exam  Constitutional: She is oriented to person, place, and time. She appears well-developed and well-nourished. No distress.  Chronically ill-appearing  HENT:  Head: Normocephalic and atraumatic.  Eyes: Pupils are equal, round, and reactive to light. EOM are normal.  Neck: Normal range of motion. Neck supple.  Cardiovascular: Normal rate and regular rhythm. Exam reveals no gallop and no friction rub.  No murmur heard. Pulmonary/Chest: Effort normal. She has no  wheezes. She has no rales.  Abdominal: Soft. She exhibits no distension and no mass. There is  no tenderness. There is no guarding.  Musculoskeletal: She exhibits no edema or tenderness.  Neurological: She is alert and oriented to person, place, and time.  Skin: Skin is warm and dry. She is not diaphoretic.  Psychiatric: She has a normal mood and affect. Her behavior is normal.  Nursing note and vitals reviewed.    ED Treatments / Results  Labs (all labs ordered are listed, but only abnormal results are displayed) Labs Reviewed  CBC WITH DIFFERENTIAL/PLATELET - Abnormal; Notable for the following components:      Result Value   WBC 24.6 (*)    Hemoglobin 11.5 (*)    RDW 15.7 (*)    Neutro Abs 22.2 (*)    All other components within normal limits  BASIC METABOLIC PANEL - Abnormal; Notable for the following components:   Glucose, Bld 111 (*)    BUN 27 (*)    Creatinine, Ser 1.58 (*)    GFR calc non Af Amer 31 (*)    GFR calc Af Amer 36 (*)    All other components within normal limits  I-STAT CHEM 8, ED - Abnormal; Notable for the following components:   Sodium 134 (*)    Potassium 3.4 (*)    Chloride 100 (*)    BUN 23 (*)    Creatinine, Ser 1.60 (*)    Glucose, Bld 108 (*)    Hemoglobin 11.2 (*)    HCT 33.0 (*)    All other components within normal limits  MAGNESIUM  POC OCCULT BLOOD, ED    EKG None  Radiology Ct Abdomen Pelvis Wo Contrast  Result Date: 08/21/2017 CLINICAL DATA:  Nausea and vomiting. EXAM: CT ABDOMEN AND PELVIS WITHOUT CONTRAST TECHNIQUE: Multidetector CT imaging of the abdomen and pelvis was performed following the standard protocol without IV contrast. COMPARISON:  Ultrasound 08/10/2017.  CT 04/13/2017 FINDINGS: Lower chest: Pulmonary nodules at the lung bases are similar to prior exam. Emphysema. Hepatobiliary: No focal hepatic lesion allowing for lack contrast. Gallbladder physiologically distended, no calcified stone. No biliary dilatation. Pancreas:  Parenchymal atrophy. No ductal dilatation or inflammation. Spleen: Normal in size without focal abnormality. Adrenals/Urinary Tract: Left adrenal thickening without dominant nodule. Right adrenal gland is normal. Moderate right hydronephrosis has increased from prior CT. Ureteral stent noted on ultrasound is no longer visualized. Hydroureter to the level of the pelvic brim where there is focal soft tissue density measuring 14 x 9 mm, previously 18 x 13 mm. There is a nonobstructing 3 mm stone in the lower right kidney. 13 mm hyperdense lesion in the medial upper right kidney is new from prior CT in suggestive of hemorrhagic cyst. No left hydronephrosis or left hydroureter. Parapelvic cyst in the mid left kidney is again seen, not as well visualized without contrast. Urinary bladder is decompressed. No bladder stones. Stomach/Bowel: Small hiatal hernia. Stomach physiologically distended. No small bowel obstruction, wall thickening or inflammatory change. There is wall thickening with pericolonic stranding about the splenic flexure and descending colon. Sigmoid colon is nondistended limiting assessment. The appendix is not confidently visualized, no pericecal or right lower quadrant inflammation. Vascular/Lymphatic: Moderate aorto bi-iliac atherosclerosis. No aneurysm. No enlarged abdominal or pelvic lymph nodes. Reproductive: Uterus and bilateral adnexa are unremarkable. Other: No free air or ascites.  No intra-abdominal abscess. Musculoskeletal: There are no acute or suspicious osseous abnormalities. Degenerative change throughout spine. IMPRESSION: 1. Colitis involving the splenic flexure and descending colon, likely infectious or inflammatory. 2. Increased right hydronephrosis since 04/13/2017 CT, with focal soft  tissue prominence in the region of the distal right ureter again seen, better characterized on prior CT with contrast. 3. Hemorrhagic cysts in the right kidney. 4.  Aortic Atherosclerosis (ICD10-I70.0).  Electronically Signed   By: Jeb Levering M.D.   On: 08/21/2017 05:15    Procedures Procedures (including critical care time)  Medications Ordered in ED Medications  acetaminophen (TYLENOL) tablet 1,000 mg (has no administration in time range)  ciprofloxacin (CIPRO) IVPB 400 mg (has no administration in time range)  metroNIDAZOLE (FLAGYL) IVPB 500 mg (has no administration in time range)  morphine 2 MG/ML injection 2 mg (has no administration in time range)  ondansetron (ZOFRAN) injection 4 mg (has no administration in time range)  sodium chloride 0.9 % bolus 1,000 mL (0 mLs Intravenous Stopped 08/21/17 0523)  ondansetron (ZOFRAN) injection 4 mg (4 mg Intravenous Given 08/21/17 0352)     Initial Impression / Assessment and Plan / ED Course  I have reviewed the triage vital signs and the nursing notes.  Pertinent labs & imaging results that were available during my care of the patient were reviewed by me and considered in my medical decision making (see chart for details).     77 yo F with a chief complaint of diarrhea.  The patient had a very large bowel movement this morning and there is some concern that there was blood in it.  So she was brought to the emergency department.  Had been somewhat weak this morning as well. Will obtain labs, give a bolus of fluids.  Reassess. Stool does not appear bloody. Hbg at baseline.  The patient was found to have acute kidney injury.  Went from a baseline creatinine of 0.6-1.6.  She had a very significant leukocytosis of 25,000 and so I obtained a CT scan of the abdomen pelvis without contrast due to the renal injury.  This showed likely colitis.  Will cover with antibiotics.  Will discuss with the hospitalist for admission.  The patients results and plan were reviewed and discussed.   Any x-rays performed were independently reviewed by myself.   Differential diagnosis were considered with the presenting HPI.  Medications  acetaminophen  (TYLENOL) tablet 1,000 mg (has no administration in time range)  ciprofloxacin (CIPRO) IVPB 400 mg (has no administration in time range)  metroNIDAZOLE (FLAGYL) IVPB 500 mg (has no administration in time range)  morphine 2 MG/ML injection 2 mg (has no administration in time range)  ondansetron (ZOFRAN) injection 4 mg (has no administration in time range)  sodium chloride 0.9 % bolus 1,000 mL (0 mLs Intravenous Stopped 08/21/17 0523)  ondansetron (ZOFRAN) injection 4 mg (4 mg Intravenous Given 08/21/17 0352)    Vitals:   08/21/17 0228 08/21/17 0352  BP: (!) 179/85 132/70  Pulse: 85 80  Resp: (!) 22 17  Temp: (!) 97.2 F (36.2 C)   TempSrc: Oral   SpO2: 97% 97%    Final diagnoses:  Colitis  AKI (acute kidney injury) ([place])    Admission/ observation were discussed with the admitting physician, patient and/or family and they are comfortable with the plan.    Final Clinical Impressions(s) / ED Diagnoses   Final diagnoses:  Colitis  AKI (acute kidney injury) [facility])    ED Discharge Orders    None       Deno Etienne, DO 08/21/17 [phone]

## 2017-08-21 NOTE — ED Notes (Signed)
Bed: IP77 Expected date:  Expected time:  Means of arrival:  Comments: Fall found in floor

## 2017-08-22 DIAGNOSIS — E43 Unspecified severe protein-calorie malnutrition: Secondary | ICD-10-CM

## 2017-08-22 DIAGNOSIS — E876 Hypokalemia: Secondary | ICD-10-CM

## 2017-08-22 LAB — CBC
HEMATOCRIT: 35.6 % — AB (ref 36.0–46.0)
HEMOGLOBIN: 11.1 g/dL — AB (ref 12.0–15.0)
MCH: 26.9 pg (ref 26.0–34.0)
MCHC: 31.2 g/dL (ref 30.0–36.0)
MCV: 86.2 fL (ref 78.0–100.0)
Platelets: 249 10*3/uL (ref 150–400)
RBC: 4.13 MIL/uL (ref 3.87–5.11)
RDW: 15.5 % (ref 11.5–15.5)
WBC: 13.3 10*3/uL — AB (ref 4.0–10.5)

## 2017-08-22 LAB — URINALYSIS, ROUTINE W REFLEX MICROSCOPIC
BILIRUBIN URINE: NEGATIVE
Bacteria, UA: NONE SEEN
Glucose, UA: NEGATIVE mg/dL
Ketones, ur: 20 mg/dL — AB
LEUKOCYTES UA: NEGATIVE
Nitrite: NEGATIVE
PROTEIN: NEGATIVE mg/dL
Specific Gravity, Urine: 1.009 (ref 1.005–1.030)
pH: 7 (ref 5.0–8.0)

## 2017-08-22 LAB — BASIC METABOLIC PANEL
ANION GAP: 12 (ref 5–15)
BUN: 8 mg/dL (ref 6–20)
CHLORIDE: 103 mmol/L (ref 101–111)
CO2: 24 mmol/L (ref 22–32)
Calcium: 8.4 mg/dL — ABNORMAL LOW (ref 8.9–10.3)
Creatinine, Ser: 0.68 mg/dL (ref 0.44–1.00)
GFR calc Af Amer: >60 mL/min (ref >60)
GLUCOSE: 101 mg/dL — AB (ref 65–99)
POTASSIUM: 3.4 mmol/L — AB (ref 3.5–5.1)
Sodium: 139 mmol/L (ref 135–145)

## 2017-08-22 LAB — MAGNESIUM: Magnesium: 1.9 mg/dL (ref 1.7–2.4)

## 2017-08-22 MED ORDER — CIPROFLOXACIN IN D5W 400 MG/200ML IV SOLN
400.0000 mg | Freq: Two times a day (BID) | INTRAVENOUS | Status: DC
  Administered 2017-08-22 – 2017-08-23 (×2): 400 mg via INTRAVENOUS
  Filled 2017-08-22 (×2): qty 200

## 2017-08-22 MED ORDER — POTASSIUM CHLORIDE CRYS ER 10 MEQ PO TBCR
40.0000 meq | EXTENDED_RELEASE_TABLET | Freq: Once | ORAL | Status: AC
  Administered 2017-08-22: 40 meq via ORAL
  Filled 2017-08-22: qty 4

## 2017-08-22 MED ORDER — AMLODIPINE BESYLATE 5 MG PO TABS
5.0000 mg | ORAL_TABLET | Freq: Every day | ORAL | Status: DC
  Administered 2017-08-22 – 2017-08-24 (×3): 5 mg via ORAL
  Filled 2017-08-22 (×3): qty 1

## 2017-08-22 MED ORDER — SODIUM CHLORIDE 0.9 % IV SOLN
INTRAVENOUS | Status: AC
  Administered 2017-08-22: 16:00:00 via INTRAVENOUS

## 2017-08-22 NOTE — Progress Notes (Signed)
Urology Inpatient Progress Report  Colitis [K52.9] AKI (acute kidney injury) ([place]) [N17.9]        Intv/Subj: No acute events overnight. Patient is without complaint. Feels better - low back pain improving  Principal Problem:   Colitis, infectious Active Problems:   COPD with emphysema (HCC)   Essential hypertension, benign   Hydronephrosis of right kidney   AKI (acute kidney injury) ([place])   Protein-calorie malnutrition, severe  Current Facility-Administered Medications  Medication Dose Route Frequency Provider Last Rate Last Dose  . 0.9 %  sodium chloride infusion   Intravenous Continuous Etta Quill, DO 75 mL/hr at 08/21/17 [phone]    . acetaminophen (TYLENOL) tablet 650 mg  650 mg Oral Q6H PRN Etta Quill, DO       Or  . acetaminophen (TYLENOL) suppository 650 mg  650 mg Rectal Q6H PRN Etta Quill, DO      . albuterol (PROVENTIL) (2.5 MG/3ML) 0.083% nebulizer solution 2.5 mg  2.5 mg Nebulization Q6H PRN Etta Quill, DO      . arformoterol [facility]) nebulizer solution 15 mcg  15 mcg Nebulization BID Etta Quill, DO   15 mcg at 08/21/17 2040  . atorvastatin (LIPITOR) tablet 20 mg  20 mg Oral q1800 Jennette Kettle M, DO   20 mg at 08/21/17 1735  . cholecalciferol (VITAMIN D) tablet 2,000 Units  2,000 Units Oral Daily Etta Quill, DO   2,000 Units at 08/21/17 5462  . ciprofloxacin (CIPRO) IVPB 400 mg  400 mg Intravenous Q24H Dorrene German, RPH 200 mL/hr at 08/22/17 0528 400 mg at 08/22/17 0528  . escitalopram (LEXAPRO) tablet 20 mg  20 mg Oral Daily Jennette Kettle M, DO   20 mg at 08/21/17 7035  . fluticasone (FLONASE) 50 MCG/ACT nasal spray 2 spray  2 spray Each Nare Daily Etta Quill, DO   2 spray at 08/21/17 [phone]  . HYDROmorphone (DILAUDID) injection 0.5-1 mg  0.5-1 mg Intravenous Q4H PRN Etta Quill, DO   0.5 mg at 08/22/17 0414  . metroNIDAZOLE (FLAGYL) IVPB 500 mg  500 mg Intravenous Q8H Jennette Kettle M, DO 100 mL/hr at 08/22/17 0528  500 mg at 08/22/17 0528  . montelukast (SINGULAIR) tablet 10 mg  10 mg Oral QHS Jennette Kettle M, DO   10 mg at 08/21/17 2149  . multivitamin with minerals tablet 1 tablet  1 tablet Oral Daily Etta Quill, DO   1 tablet at 08/21/17 8182  . ondansetron (ZOFRAN) tablet 4 mg  4 mg Oral Q6H PRN Etta Quill, DO       Or  . ondansetron [facility]) injection 4 mg  4 mg Intravenous Q6H PRN Etta Quill, DO      . pantoprazole (PROTONIX) EC tablet 40 mg  40 mg Oral Daily Jennette Kettle M, DO   40 mg at 08/21/17 9937  . potassium chloride (K-DUR,KLOR-CON) CR tablet 40 mEq  40 mEq Oral Once Vernell Leep D, MD      . predniSONE (DELTASONE) tablet 20 mg  20 mg Oral BID WC Jennette Kettle M, DO   20 mg at 08/21/17 1735  . promethazine (PHENERGAN) injection 6.25-12.5 mg  6.25-12.5 mg Intravenous Q6H PRN Etta Quill, DO      . umeclidinium bromide (INCRUSE ELLIPTA) 62.5 MCG/INH 1 puff  1 puff Inhalation Daily Jennette Kettle M, DO   1 puff at 08/21/17 1108  . vitamin B-12 (CYANOCOBALAMIN) tablet 1,000 mcg  1,000 mcg Oral  Daily Etta Quill, DO   1,000 mcg at 08/21/17 6433     Objective: Vital: Vitals:   08/21/17 1233 08/21/17 1522 08/21/17 2112 08/22/17 0531  BP:  (!) 161/74 (!) 146/78 (!) 172/82  Pulse:  77 76 85  Resp:  (!) 24 15 15   Temp:  98.7 F (37.1 C) 98.3 F (36.8 C) 99 F (37.2 C)  TempSrc:  Oral Oral Oral  SpO2:  90% 94% 96%  Weight: 46.7 kg (103 lb)     Height:       I/Os: I/O last 3 completed shifts: In: 2391.3 [I.V.:591.3; IV Piggyback:1800] Out: -   Physical Exam:  General: Patient is in no apparent distress Lungs: Normal respiratory effort, chest expands symmetrically. GI: The abdomen is soft and nontender without mass. Mild CVA tenderness bilaterally Ext: lower extremities symmetric  Lab Results: Recent Labs    08/21/17 0240 08/21/17 0337 08/22/17 0523  WBC 24.6*  --  13.3*  HGB 11.5* 11.2* 11.1*  HCT 36.1 33.0* 35.6*   Recent Labs     08/21/17 0240 08/21/17 0337 08/22/17 0523  NA 139 134* 139  K 3.6 3.4* 3.4*  CL 103 100* 103  CO2 23  --  24  GLUCOSE 111* 108* 101*  BUN 27* 23* 8  CREATININE 1.58* 1.60* 0.68  CALCIUM 9.4  --  8.4*   No results for input(s): LABPT, INR in the last 72 hours. No results for input(s): LABURIN in the last 72 hours. Results for orders placed or performed during the hospital encounter of 08/09/17  Culture, Urine     Status: Abnormal   Collection Time: 08/10/17 12:11 PM  Result Value Ref Range Status   Specimen Description   Final    URINE, CLEAN CATCH Performed at [facility], [place] [address] [place], [place] [address]    Special Requests   Final    NONE Performed at [facility], [place] [address], [place], [place] [address]    Culture (A)  Final    <10,000 COLONIES/mL INSIGNIFICANT GROWTH Performed at [place] [address] [place], [place] [address]    Report Status 08/11/2017 FINAL  Final    Studies/Results: Ct Abdomen Pelvis Wo Contrast  Result Date: 08/21/2017 CLINICAL DATA:  Nausea and vomiting. EXAM: CT ABDOMEN AND PELVIS WITHOUT CONTRAST TECHNIQUE: Multidetector CT imaging of the abdomen and pelvis was performed following the standard protocol without IV contrast. COMPARISON:  Ultrasound 08/10/2017.  CT 04/13/2017 FINDINGS: Lower chest: Pulmonary nodules at the lung bases are similar to prior exam. Emphysema. Hepatobiliary: No focal hepatic lesion allowing for lack contrast. Gallbladder physiologically distended, no calcified stone. No biliary dilatation. Pancreas: Parenchymal atrophy. No ductal dilatation or inflammation. Spleen: Normal in size without focal abnormality. Adrenals/Urinary Tract: Left adrenal thickening without dominant nodule. Right adrenal gland is normal. Moderate right hydronephrosis has increased from prior CT. Ureteral stent noted on ultrasound is no longer visualized. Hydroureter to the level of the  pelvic brim where there is focal soft tissue density measuring 14 x 9 mm, previously 18 x 13 mm. There is a nonobstructing 3 mm stone in the lower right kidney. 13 mm hyperdense lesion in the medial upper right kidney is new from prior CT in suggestive of hemorrhagic cyst. No left hydronephrosis or left hydroureter. Parapelvic cyst in the mid left kidney is again seen, not as well visualized without contrast. Urinary bladder is decompressed. No bladder stones. Stomach/Bowel: Small hiatal hernia. Stomach physiologically distended.  No small bowel obstruction, wall thickening or inflammatory change. There is wall thickening with pericolonic stranding about the splenic flexure and descending colon. Sigmoid colon is nondistended limiting assessment. The appendix is not confidently visualized, no pericecal or right lower quadrant inflammation. Vascular/Lymphatic: Moderate aorto bi-iliac atherosclerosis. No aneurysm. No enlarged abdominal or pelvic lymph nodes. Reproductive: Uterus and bilateral adnexa are unremarkable. Other: No free air or ascites.  No intra-abdominal abscess. Musculoskeletal: There are no acute or suspicious osseous abnormalities. Degenerative change throughout spine. IMPRESSION: 1. Colitis involving the splenic flexure and descending colon, likely infectious or inflammatory. 2. Increased right hydronephrosis since 04/13/2017 CT, with focal soft tissue prominence in the region of the distal right ureter again seen, better characterized on prior CT with contrast. 3. Hemorrhagic cysts in the right kidney. 4.  Aortic Atherosclerosis (ICD10-I70.0). Electronically Signed   By: Jeb Levering M.D.   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  Will see as needed.  Follow-up with me in clinic as scheduled.   Louis Meckel, MD Urology 08/22/2017, 8:04 AM

## 2017-08-22 NOTE — Progress Notes (Signed)
Pharmacy Antibiotic Note  Gina Fritz is a 77 y.o. female admitted on 08/21/2017 with intra-abdominal.  Pharmacy has been consulted for cipro dosing.  Plan: 1) SCr improved - change Cipro from 400 mg IV q24h to 400mg  IV q12 2) Flagyl 500 mg IV q8h (MD) 3) Will sign off  Height: 5' 2.01" (157.5 cm) Weight: 103 lb (46.7 kg) IBW/kg (Calculated) : 50.12  Temp (24hrs), Avg:98.8 F (37.1 C), Min:98.3 F (36.8 C), Max:99 F (37.2 C)  Recent Labs  Lab 08/21/17 0240 08/21/17 0337 08/22/17 0523  WBC 24.6*  --  13.3*  CREATININE 1.58* 1.60* 0.68    Estimated Creatinine Clearance: 44.1 mL/min (by C-G formula based on SCr of 0.68 mg/dL).    Allergies  Allergen Reactions  . Penicillins Anaphylaxis and Other (See Comments)    Has patient had a PCN reaction causing immediate rash, facial/tongue/throat swelling, SOB or lightheadedness with hypotension: Yes Has patient had a PCN reaction causing severe rash involving mucus membranes or skin necrosis: Yes Has patient had a PCN reaction that required hospitalization Yes Has patient had a PCN reaction occurring within the last 10 years: No If all of the above answers are "NO", then may proceed with Cephalosporin use.   . Sulfa Antibiotics Anaphylaxis and Hives  . Soy Allergy Itching and Rash     Thank you for allowing pharmacy to be a part of this patient's care.  Kara Mead 08/22/2017 8:05 AM

## 2017-08-22 NOTE — Progress Notes (Addendum)
PROGRESS NOTE   Gina Fritz  EGB:[id number]    DOB: [birth year]    DOA: 08/21/2017  PCP: [place], Betty G, MD   I have briefly reviewed patients previous medical records in [facility].  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  Retroperitoneal fibrosis, also treated for CAP with 5-day course of azithromycin on 5/10, presented to ED with complaints of acute on chronic abdominal pain, severe diarrhea, nausea, vomiting and some blood in stools with reduced oral intake.  Admitted for suspected infectious colitis, acute kidney injury and right hydronephrosis.  Urology consulted.  Improving.   Assessment & Plan:   Principal Problem:   Colitis, infectious Active Problems:   COPD with emphysema ([place])   Essential hypertension, benign   Hydronephrosis of right kidney   AKI (acute kidney injury) ([place])   Protein-calorie malnutrition, severe   Infectious colitis: CT abdomen 5/20: Colitis of splenic flexure and descending colon.  Unclear etiology.  Could be acute bacterial versus viral versus rule out C. difficile (has risk factors).  Marked leukocytosis could be due to C. difficile but also may be from steroids.  Treating supportively with bowel rest/liquid diet, IV fluids, empirically started on IV Cipro and Flagyl.  Unless she has further diarrhea, hold off on starting oral vancomycin until confirmed by testing.  Much improved.  No further nausea or vomiting or BM for >24 hours.  Tolerating diet.  Leukocytosis improved.  Less likely to be C. difficile.  Advance to soft diet, continue empiric antibiotics.  Right hydronephrosis,  worsening: Suspected due to extrinsic compression from soft tissue density in RLQ.  Urology was consulted.  Patient adamantly against any intervention at this time.  Acute kidney injury resolved.  Urology follow-up appreciated.  I discussed with Dr. 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  Although patient talks a lot, no features to suggest psychosis.  Urology recommends current dose of steroids.  RLQ lesion/pelvic mass: As per urology, patient has a small poorly defined lesion in the right pelvis overlying the iliac vessels and encasing the right ureter creating hydronephrosis.  She is being managed for retroperitoneal fibrosis and getting prednisone for same.  Acute kidney injury: Could be due to dehydration from GI losses, ARB and right hydronephrosis.  IV fluids.  Resolved.  Essential hypertension: Blood pressure starting to increase.  Resumed Amlodipine 5 mg daily.  Continue to hold ARB.  COPD: Stable without clinical bronchospasm.  Continue Singulair.  Anxiety and depression: Continue Lexapro and Umeclidinium  GERD: PPI.  Hyperlipidemia: Statins.  Normocytic anemia: Likely anemia of chronic disease follow CBCs daily.  Stable.  Leukocytosis: Possibly from steroids but could be infectious too.  Improving.  Recent CAP/RLL: Treated.  Severe protein calorie malnutrition: Management per dietitian input.  Hypokalemia: Replace and follow.  Magnesium normal.     DVT prophylaxis: SCDs. Code Status: Full Family Communication: None at bedside today.   Disposition: DC home pending clinical improvement, possibly 5/22.  PT consulted.   Consultants:  Urology.  Procedures:  None  Antimicrobials:  IV Cipro and Flagyl 5/20 >   Subjective: Patient  interviewed and examined along with her RN.  Reports feeling much better compared to yesterday.  Denies nausea, vomiting or BM for >24 hours.  Tolerated liquids and waiting to start soft diet this morning.  Reports minimal right lower quadrant abdominal discomfort but no pain.  As per RN, no acute issues noted.  ROS: Above.  Objective:  Vitals:   08/21/17 2112 08/22/17 0531 08/22/17 0807 08/22/17 1356  BP: (!) 146/78 (!) 172/82  136/67  Pulse: 76 85  93  Resp: 15 15  17   Temp: 98.3 F (36.8 C) 99 F (37.2 C)  98.5 F (36.9 C)  TempSrc: Oral Oral  Oral  SpO2: 94% 96% 95% 96%  Weight:      Height:        Examination:  General exam: Elderly female, small built, frail and chronically ill looking lying comfortably propped up in bed.  Oral mucosa moist. Respiratory system: Clear to auscultation.  No increased work of breathing.. Cardiovascular system: S1 & S2 heard, RRR. No JVD, murmurs, rubs, gallops or clicks. No pedal edema.  Stable Gastrointestinal system: Abdomen is nondistended, soft and nontender.  Normal bowel sounds heard. Central nervous system: Alert and oriented. No focal neurological deficits.  Stable Extremities: Symmetric 5 x 5 power.  Stable Skin: No rashes, lesions or ulcers Psychiatry: Judgement and insight appear normal. Mood & affect better than yesterday.  Pleasant, slightly anxious.  No audiovisual hallucinations noted.     Data Reviewed: I have personally reviewed following labs and imaging studies  CBC: Recent Labs  Lab 08/21/17 0240 08/21/17 0337 08/22/17 0523  WBC 24.6*  --  13.3*  NEUTROABS 22.2*  --   --   HGB 11.5* 11.2* 11.1*  HCT 36.1 33.0* 35.6*  MCV 85.7  --  86.2  PLT 282  --  960   Basic Metabolic Panel: Recent Labs  Lab 08/21/17 0240 08/21/17 0337 08/22/17 0523  NA 139 134* 139  K 3.6 3.4* 3.4*  CL 103 100* 103  CO2 23  --  24  GLUCOSE 111* 108* 101*  BUN 27* 23* 8  CREATININE 1.58* 1.60* 0.68  CALCIUM 9.4  --  8.4*  MG 2.2   --  1.9   Liver Function Tests: No results for input(s): AST, ALT, ALKPHOS, BILITOT, PROT, ALBUMIN in the last 168 hours.  No results found for this or any previous visit (from the past 240 hour(s)).       Radiology Studies: Ct Abdomen Pelvis Wo Contrast  Result Date: 08/21/2017 CLINICAL DATA:  Nausea and vomiting. EXAM: CT ABDOMEN AND PELVIS WITHOUT CONTRAST TECHNIQUE: Multidetector CT imaging of the abdomen and pelvis was performed following the standard protocol without IV contrast. COMPARISON:  Ultrasound 08/10/2017.  CT 04/13/2017 FINDINGS: Lower chest: Pulmonary nodules at the lung bases are similar to prior exam. Emphysema. Hepatobiliary: No focal hepatic lesion allowing for lack contrast. Gallbladder physiologically distended, no calcified stone. No biliary dilatation. Pancreas: Parenchymal atrophy. No ductal dilatation or inflammation. Spleen: Normal in size without focal abnormality. Adrenals/Urinary Tract: Left adrenal thickening without dominant nodule. Right adrenal gland is normal. Moderate right hydronephrosis has increased from prior CT. Ureteral stent noted on ultrasound is no longer visualized. Hydroureter to the level of the pelvic brim where there is focal soft tissue density measuring 14 x 9 mm, previously 18 x 13 mm. There is a nonobstructing 3 mm stone in the lower right kidney. 13 mm  hyperdense lesion in the medial upper right kidney is new from prior CT in suggestive of hemorrhagic cyst. No left hydronephrosis or left hydroureter. Parapelvic cyst in the mid left kidney is again seen, not as well visualized without contrast. Urinary bladder is decompressed. No bladder stones. Stomach/Bowel: Small hiatal hernia. Stomach physiologically distended. No small bowel obstruction, wall thickening or inflammatory change. There is wall thickening with pericolonic stranding about the splenic flexure and descending colon. Sigmoid colon is nondistended limiting assessment. The appendix is  not confidently visualized, no pericecal or right lower quadrant inflammation. Vascular/Lymphatic: Moderate aorto bi-iliac atherosclerosis. No aneurysm. No enlarged abdominal or pelvic lymph nodes. Reproductive: Uterus and bilateral adnexa are unremarkable. Other: No free air or ascites.  No intra-abdominal abscess. Musculoskeletal: There are no acute or suspicious osseous abnormalities. Degenerative change throughout spine. IMPRESSION: 1. Colitis involving the splenic flexure and descending colon, likely infectious or inflammatory. 2. Increased right hydronephrosis since 04/13/2017 CT, with focal soft tissue prominence in the region of the distal right ureter again seen, better characterized on prior CT with contrast. 3. Hemorrhagic cysts in the right kidney. 4.  Aortic Atherosclerosis (ICD10-I70.0). Electronically Signed   By: Jeb Levering M.D.   On: 08/21/2017 05:15        Scheduled Meds: . arformoterol  15 mcg Nebulization BID  . atorvastatin  20 mg Oral q1800  . cholecalciferol  2,000 Units Oral Daily  . escitalopram  20 mg Oral Daily  . fluticasone  2 spray Each Nare Daily  . montelukast  10 mg Oral QHS  . multivitamin with minerals  1 tablet Oral Daily  . pantoprazole  40 mg Oral Daily  . predniSONE  20 mg Oral BID WC  . umeclidinium bromide  1 puff Inhalation Daily  . vitamin B-12  1,000 mcg Oral Daily   Continuous Infusions: . sodium chloride 75 mL/hr at 08/22/17 1342  . ciprofloxacin    . metronidazole 500 mg (08/22/17 1342)     LOS: 1 day     Vernell Leep, MD, FACP, [facility]. [facility] Pager [phone]  If 7PM-7AM, please contact night-coverage [email or web address] Password [email or web address] 08/22/2017, 2:57 PM

## 2017-08-23 LAB — BASIC METABOLIC PANEL
Anion gap: 11 (ref 5–15)
BUN: 9 mg/dL (ref 6–20)
CALCIUM: 8.6 mg/dL — AB (ref 8.9–10.3)
CO2: 24 mmol/L (ref 22–32)
Chloride: 101 mmol/L (ref 101–111)
Creatinine, Ser: 0.68 mg/dL (ref 0.44–1.00)
GFR calc Af Amer: >60 mL/min (ref >60)
GLUCOSE: 167 mg/dL — AB (ref 65–99)
Potassium: 3.7 mmol/L (ref 3.5–5.1)
Sodium: 136 mmol/L (ref 135–145)

## 2017-08-23 LAB — CBC
HEMATOCRIT: 38.3 % (ref 36.0–46.0)
Hemoglobin: 12 g/dL (ref 12.0–15.0)
MCH: 27 pg (ref 26.0–34.0)
MCHC: 31.3 g/dL (ref 30.0–36.0)
MCV: 86.1 fL (ref 78.0–100.0)
PLATELETS: 228 10*3/uL (ref 150–400)
RBC: 4.45 MIL/uL (ref 3.87–5.11)
RDW: 15.6 % — ABNORMAL HIGH (ref 11.5–15.5)
WBC: 12.3 10*3/uL — ABNORMAL HIGH (ref 4.0–10.5)

## 2017-08-23 MED ORDER — METRONIDAZOLE 500 MG PO TABS
500.0000 mg | ORAL_TABLET | Freq: Three times a day (TID) | ORAL | Status: DC
  Administered 2017-08-23 – 2017-08-24 (×4): 500 mg via ORAL
  Filled 2017-08-23 (×4): qty 1

## 2017-08-23 MED ORDER — OXYCODONE-ACETAMINOPHEN 5-325 MG PO TABS
1.0000 | ORAL_TABLET | ORAL | Status: DC | PRN
  Administered 2017-08-23: 1 via ORAL
  Filled 2017-08-23: qty 1

## 2017-08-23 MED ORDER — CIPROFLOXACIN HCL 500 MG PO TABS
500.0000 mg | ORAL_TABLET | Freq: Two times a day (BID) | ORAL | Status: DC
  Administered 2017-08-23 – 2017-08-24 (×2): 500 mg via ORAL
  Filled 2017-08-23 (×2): qty 1

## 2017-08-23 NOTE — Evaluation (Signed)
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 Pt currently with functional limitations due to the deficits listed below (see PT Problem List).  Pt will benefit from skilled PT to increase their independence and safety with mobility to allow discharge to the venue listed below.  Pt reports chronic pain and fall just prior to admission.  Daughter present for evaluation and reports concern for pt returning home.  Pt presents as high fall risk and spouse is unable to physically assist at home due to his own health issues.  Pt would benefit from short term SNF for rehab to improve independence and safe mobility prior to return home.     Follow Up Recommendations SNF    Equipment Recommendations  None recommended by PT    Recommendations for Other Services       Precautions / Restrictions Precautions Precautions: Fall Restrictions Weight Bearing Restrictions: No      Mobility  Bed Mobility Overal bed mobility: Needs Assistance Bed Mobility: Supine to Sit     Supine to sit: Min assist     General bed mobility comments: assist for trunk upright  Transfers Overall transfer level: Needs assistance Equipment used: Rolling walker (2 wheeled) Transfers: Sit to/from Stand Sit to Stand: Min assist         General transfer comment: verbal cues for safe technique, assist to rise and steady  Ambulation/Gait Ambulation/Gait assistance: Min assist Ambulation Distance (Feet): 160 Feet Assistive device: Rolling walker (2  wheeled) Gait Pattern/deviations: Step-through pattern;Decreased stride length     General Gait Details: verbal cues for safe use of RW, pt reports weakness in LEs  Stairs            Wheelchair Mobility    Modified Rankin (Stroke Patients Only)       Balance Overall balance assessment: Mild deficits observed, not formally tested;History of Falls(fall prior to admission)                                           Pertinent Vitals/Pain Pain Assessment: No/denies pain    Home Living Family/patient expects to be discharged to:: Private residence Living Arrangements: Spouse/significant other   Type of Home: House Home Access: Stairs to enter Entrance Stairs-Rails: Right Entrance Stairs-Number of Steps: 4 Home Layout: One level Home Equipment: Environmental consultant - 2 wheels;Walker - 4 wheels      Prior Function Level of Independence: Independent with assistive device(s)               Hand Dominance        Extremity/Trunk Assessment        Lower Extremity Assessment Lower Extremity Assessment: Generalized weakness       Communication   Communication: No difficulties  Cognition Arousal/Alertness: Awake/alert Behavior During Therapy: WFL for tasks assessed/performed Overall Cognitive Status: Impaired/Different from baseline Area of Impairment: Safety/judgement  Safety/Judgement: Decreased awareness of safety     General Comments: excessive talking - pt and daughter state from prednisone; daughter reports pt does not present as her typical self      General Comments      Exercises     Assessment/Plan    PT Assessment Patient needs continued PT services  PT Problem List Decreased strength;Decreased mobility;Decreased activity tolerance;Decreased safety awareness;Decreased knowledge of use of DME;Decreased balance       PT Treatment Interventions DME instruction;Therapeutic activities;Gait  training;Therapeutic exercise;Stair training;Balance training;Functional mobility training;Patient/family education    PT Goals (Current goals can be found in the Care Plan section)  Acute Rehab PT Goals PT Goal Formulation: With patient/family Time For Goal Achievement: 08/30/17 Potential to Achieve Goals: Good    Frequency Min 3X/week   Barriers to discharge        Co-evaluation               AM-PAC PT "6 Clicks" Daily Activity  Outcome Measure Difficulty turning over in bed (including adjusting bedclothes, sheets and blankets)?: None Difficulty moving from lying on back to sitting on the side of the bed? : A Little Difficulty sitting down on and standing up from a chair with arms (e.g., wheelchair, bedside commode, etc,.)?: Unable Help needed moving to and from a bed to chair (including a wheelchair)?: A Little Help needed walking in hospital room?: A Little Help needed climbing 3-5 steps with a railing? : A Lot 6 Click Score: 16    End of Session Equipment Utilized During Treatment: Gait belt Activity Tolerance: Patient tolerated treatment well Patient left: in chair;with call bell/phone within reach Nurse Communication: Mobility status(aware pt up in recliner) PT Visit Diagnosis: Other abnormalities of gait and mobility (R26.89)    Time: 0920-1000 PT Time Calculation (min) (ACUTE ONLY): 40 min   Charges:   PT Evaluation $PT Eval Low Complexity: 1 Low PT Treatments $Gait Training: 8-22 mins   PT G Codes:        Carmelia Bake, PT, DPT 08/23/2017 Pager: [phone]  York Ram E 08/23/2017, 12:28 PM

## 2017-08-23 NOTE — Progress Notes (Signed)
Urology Inpatient Progress Report  Colitis [K52.9] AKI (acute kidney injury) ([place]) [N17.9]  Intv/Subj: No acute events overnight. Patient talking manically/pressured speech Feels better  Principal Problem:   Colitis, infectious Active Problems:   COPD with emphysema ([place])   Essential hypertension, benign   Hydronephrosis of right kidney   AKI (acute kidney injury) ([employer])   Protein-calorie malnutrition, severe  Current Facility-Administered Medications  Medication Dose Route Frequency Provider Last Rate Last Dose  . acetaminophen (TYLENOL) tablet 650 mg  650 mg Oral Q6H PRN Etta Quill, DO       Or  . acetaminophen (TYLENOL) suppository 650 mg  650 mg Rectal Q6H PRN Etta Quill, DO      . albuterol (PROVENTIL) (2.5 MG/3ML) 0.083% nebulizer solution 2.5 mg  2.5 mg Nebulization Q6H PRN Etta Quill, DO      . amLODipine (NORVASC) tablet 5 mg  5 mg Oral Daily Modena Jansky, MD   5 mg at 08/23/17 1005  . arformoterol (BROVANA) nebulizer solution 15 mcg  15 mcg Nebulization BID Etta Quill, DO   15 mcg at 08/23/17 1034  . atorvastatin (LIPITOR) tablet 20 mg  20 mg Oral q1800 Etta Quill, DO   20 mg at 08/23/17 1634  . cholecalciferol (VITAMIN D) tablet 2,000 Units  2,000 Units Oral Daily Etta Quill, DO   2,000 Units at 08/23/17 1004  . ciprofloxacin (CIPRO) tablet 500 mg  500 mg Oral BID Nita [place], MD   500 mg at 08/23/17 1634  . escitalopram (LEXAPRO) tablet 20 mg  20 mg Oral Daily Jennette Kettle M, DO   20 mg at 08/23/17 1004  . fluticasone (FLONASE) 50 MCG/ACT nasal spray 2 spray  2 spray Each Nare Daily Etta Quill, DO   2 spray at 08/23/17 1005  . HYDROmorphone (DILAUDID) injection 0.5-1 mg  0.5-1 mg Intravenous Q4H PRN Etta Quill, DO   1 mg at 08/22/17 1613  . metroNIDAZOLE (FLAGYL) tablet 500 mg  500 mg Oral Q8H Nita [place], MD   500 mg at 08/23/17 1352  . montelukast (SINGULAIR) tablet 10 mg  10 mg Oral QHS Jennette Kettle M, DO   10 mg at 08/22/17 2207  . multivitamin with minerals tablet 1 tablet  1 tablet Oral Daily Jennette Kettle M, DO   1 tablet at 08/23/17 1005  . ondansetron (ZOFRAN) tablet 4 mg  4 mg Oral Q6H PRN Etta Quill, DO       Or  . ondansetron [facility]) injection 4 mg  4 mg Intravenous Q6H PRN Etta Quill, DO      . oxyCODONE-acetaminophen (PERCOCET/ROXICET) 5-325 MG per tablet 1 tablet  1 tablet Oral Q4H PRN Nita [place], MD   1 tablet at 08/23/17 1634  . pantoprazole (PROTONIX) EC tablet 40 mg  40 mg Oral Daily Jennette Kettle M, DO   40 mg at 08/23/17 1004  . predniSONE (DELTASONE) tablet 20 mg  20 mg Oral BID WC Jennette Kettle M, DO   20 mg at 08/23/17 1634  . promethazine (PHENERGAN) injection 6.25-12.5 mg  6.25-12.5 mg Intravenous Q6H PRN Etta Quill, DO      . umeclidinium bromide (INCRUSE ELLIPTA) 62.5 MCG/INH 1 puff  1 puff Inhalation Daily Jennette Kettle M, DO   1 puff at 08/23/17 1035  . vitamin B-12 (CYANOCOBALAMIN) tablet 1,000 mcg  1,000 mcg Oral Daily Jennette Kettle M, DO   1,000 mcg at 08/23/17 1004  Objective: Vital: Vitals:   08/22/17 0807 08/22/17 1356 08/22/17 2055 08/23/17 1035  BP:  136/67 (!) 153/87   Pulse:  93 85   Resp:  17 16   Temp:  98.5 F (36.9 C)    TempSrc:  Oral    SpO2: 95% 96% 98% 96%  Weight:      Height:       I/Os: I/O last 3 completed shifts: In: 1632.5 [P.O.:240; I.V.:592.5; IV Piggyback:800] Out: 2000 [Urine:2000]  Physical Exam:  General: Patient is in no apparent distress Lungs: Normal respiratory effort, chest expands symmetrically. GI: The abdomen is soft and nontender without mass.  Ext: lower extremities symmetric  Lab Results: Recent Labs    08/21/17 0240 08/21/17 0337 08/22/17 0523 08/23/17 0606  WBC 24.6*  --  13.3* 12.3*  HGB 11.5* 11.2* 11.1* 12.0  HCT 36.1 33.0* 35.6* 38.3   Recent Labs    08/21/17 0240 08/21/17 0337 08/22/17 0523 08/23/17 0606  NA 139 134* 139 136  K 3.6 3.4*  3.4* 3.7  CL 103 100* 103 101  CO2 23  --  24 24  GLUCOSE 111* 108* 101* 167*  BUN 27* 23* 8 9  CREATININE 1.58* 1.60* 0.68 0.68  CALCIUM 9.4  --  8.4* 8.6*   No results for input(s): LABPT, INR in the last 72 hours. No results for input(s): LABURIN in the last 72 hours. Results for orders placed or performed during the hospital encounter of 08/09/17  Culture, Urine     Status: Abnormal   Collection Time: 08/10/17 12:11 PM  Result Value Ref Range Status   Specimen Description   Final    URINE, CLEAN CATCH Performed at [facility], [place] [address] [place], [place] [address]    Special Requests   Final    NONE Performed at [facility], [place] [address] [place], [place] [address]    Culture (A)  Final    <10,000 COLONIES/mL INSIGNIFICANT GROWTH Performed at [facility] [address], [place], [place] [address]    Report Status 08/11/2017 FINAL  Final    Studies/Results: No results found.  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 Colitis per hospitalist No interventions planned for hydro - follow clinically.  Will see as needed.  Follow-up with me in clinic as scheduled.  Spoke to family - updated on plan.   Louis Meckel, MD Urology 08/23/2017, 4:58 PM

## 2017-08-23 NOTE — Progress Notes (Signed)
PROGRESS NOTE   Gina Fritz  BOF:[id number]    DOB: 1941-03-20    DOA: 08/21/2017  PCP: [place], Betty G, MD   I have briefly reviewed patients previous medical [facility].  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  Retroperitoneal fibrosis, also treated for CAP with 5-day course of azithromycin on 5/10, Admit 5/20 complaints of acute on chronic abdominal pain, severe diarrhea, nausea, vomiting and some blood in stools with reduced oral intake.  Admitted for suspected infectious colitis, acute kidney injury and right hydronephrosis.  Urology consulted.    Assessment & Plan:   Principal Problem:   Colitis, infectious Active Problems:   COPD with emphysema ([place])   Essential hypertension, benign   Hydronephrosis of right kidney   AKI (acute kidney injury) ([place])   Protein-calorie malnutrition, severe   Infectious colitis: CT abdomen 5/20: Colitis of splenic flexure and descending colon.  Unclear etiology.  Could be acute bacterial versus viral versus rule out C. difficile (has risk factors).  Marked leukocytosis likely 2/2 steroids.   empirically started on IV Cipro and Flagyl.    Much improved.  Less likely to be C. difficile.   Advance to reg diet, swithc iv to po  empiric antibiotics.  Right hydronephrosis, worsening: Suspected due to extrinsic compression from soft tissue density in RLQ.  Urology was consulted.  Patient adamantly against any intervention at this time.  Acute kidney injury resolved.  Urology follow-up appreciated.  Per Urologist  continue current prednisone taper,  no interventions planned for  hydronephrosis,  outpatient follow-up in clinic scheduled.   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  Although patient talks a lot, no features to suggest psychosis.   Urology recommends current dose of steroids.  RLQ lesion/pelvic mass: As per urology, patient has a small poorly defined lesion in the right pelvis overlying the iliac vessels and encasing the right ureter creating hydronephrosis.  She is being managed for retroperitoneal fibrosis and getting prednisone for same.  Acute kidney injury: Could be due to dehydration from GI losses, ARB and right hydronephrosis.  IV fluids.  Resolved.  Essential hypertension: Blood pressure starting to increase.  Resumed Amlodipine 5 mg daily.  Continue to hold ARB.  COPD: Stable without clinical bronchospasm.  Continue Singulair.  Anxiety and depression: Continue Lexapro and Umeclidinium  GERD: PPI.  Hyperlipidemia: Statins.  Normocytic anemia: Likely anemia of chronic disease follow CBCs daily.  Stable.  Leukocytosis: Possibly from steroids but could be infectious too.  Improving.  Recent CAP/RLL: Treated.  Severe protein calorie malnutrition: Management per dietitian input.  Hypokalemia: Replace and follow.  Magnesium normal.     DVT prophylaxis: SCDs. Code Status: Full Family Communication: None at bedside today.   Disposition: DC home pending clinical improvement, possibly 5/23 on oral empiric Rx for colitis   Consultants:  Urology.  Procedures:  None  Antimicrobials:  IV Cipro and Flagyl 5/20 >   Subjective: Garrulous and pleasant  Appropriate No distress no n/v or Diarrhea  ROS: Above.  Objective:  Vitals:   08/22/17 0807 08/22/17 1356 08/22/17 2055 08/23/17 1035  BP:  136/67 (!) 153/87   Pulse:  93 85   Resp:  17 16   Temp:  98.5 F (36.9 C)    TempSrc:  Oral    SpO2: 95% 96% 98% 96%  Weight:      Height:        Examination:  General exam: Elderly female, small built, frail and  chronically ill looking lying comfortably propped up in bed.  Oral mucosa moist. Respiratory system: Clear to auscultation.  No increased work of breathing.. Cardiovascular system: S1 & S2 heard, RRR. No JVD, murmurs, rubs, gallops or clicks. No pedal edema.  Stable Gastrointestinal system: Abdomen is nondistended, soft and nontender.  Normal bowel sounds heard. Central nervous system: Alert and oriented. No focal neurological deficits.  Stable Extremities: Symmetric 5 x 5 power.  Stable Skin: No rashes, lesions or ulcers Psychiatry: Judgement and insight appear normal. Mood & affect better than yesterday.  Pleasant, slightly anxious.  No audiovisual hallucinations noted.     Data Reviewed: I have personally reviewed following labs and imaging studies  CBC: Recent Labs  Lab 08/21/17 0240 08/21/17 0337 08/22/17 0523 08/23/17 0606  WBC 24.6*  --  13.3* 12.3*  NEUTROABS 22.2*  --   --   --   HGB 11.5* 11.2* 11.1* 12.0  HCT 36.1 33.0* 35.6* 38.3  MCV 85.7  --  86.2 86.1  PLT 282  --  249 378   Basic Metabolic Panel: Recent Labs  Lab 08/21/17 0240 08/21/17 0337 08/22/17 0523 08/23/17 0606  NA 139 134* 139 136  K 3.6 3.4* 3.4* 3.7  CL 103 100* 103 101  CO2 23  --  24 24  GLUCOSE 111* 108* 101* 167*  BUN 27* 23* 8 9  CREATININE 1.58* 1.60* 0.68 0.68  CALCIUM 9.4  --  8.4* 8.6*  MG 2.2  --  1.9  --    Liver Function Tests: No results for input(s): AST, ALT, ALKPHOS, BILITOT, PROT, ALBUMIN in the last 168 hours.  No results found for this or any previous visit (from the past 240 hour(s)).       Radiology Studies: No results found.      Scheduled Meds: . amLODipine  5 mg Oral Daily  . arformoterol  15 mcg Nebulization BID  . atorvastatin  20 mg Oral q1800  . cholecalciferol  2,000 Units Oral Daily  . ciprofloxacin  500 mg Oral BID  . escitalopram  20 mg Oral Daily  . fluticasone  2 spray Each Nare Daily  . metroNIDAZOLE  500 mg Oral Q8H  . montelukast  10 mg  Oral QHS  . multivitamin with minerals  1 tablet Oral Daily  . pantoprazole  40 mg Oral Daily  . predniSONE  20 mg Oral BID WC  . umeclidinium bromide  1 puff Inhalation Daily  . vitamin B-12  1,000 mcg Oral Daily   Continuous Infusions:    LOS: 2 days   Verneita Griffes, MD [facility] ([phone]  If 7PM-7AM, please contact night-coverage [email or web address] Password [facility] 08/23/2017, 12:02 PM

## 2017-08-24 DIAGNOSIS — A09 Infectious gastroenteritis and colitis, unspecified: Principal | ICD-10-CM

## 2017-08-24 MED ORDER — OXYCODONE-ACETAMINOPHEN 5-325 MG PO TABS: 1.0000 | ORAL_TABLET | Freq: Two times a day (BID) | ORAL | 0 refills | Status: AC

## 2017-08-24 MED ORDER — ARFORMOTEROL TARTRATE 15 MCG/2ML IN NEBU: 15.0000 ug | INHALATION_SOLUTION | Freq: Two times a day (BID) | RESPIRATORY_TRACT | 0 refills | Status: AC

## 2017-08-24 MED ORDER — PREDNISONE 10 MG PO TABS: ORAL_TABLET | ORAL | 0 refills | Status: AC

## 2017-08-24 MED ORDER — METRONIDAZOLE 500 MG PO TABS: 500.0000 mg | ORAL_TABLET | Freq: Three times a day (TID) | ORAL | 0 refills | Status: AC

## 2017-08-24 MED ORDER — CIPROFLOXACIN HCL 500 MG PO TABS: 500.0000 mg | ORAL_TABLET | Freq: Two times a day (BID) | ORAL | 0 refills | Status: AC

## 2017-08-24 NOTE — Consult Note (Signed)
   [facility] CM Inpatient Consult   08/24/2017  Gina Fritz 30-Jul-1940 [id number]   Received request from inpatient LCSW to request writer to cross check for eligibility for [place] program. However, patient is listed as having [place] as primary insurance. Unfortunately, patient is not eligible for [place] at this time. Next Gen/Medicare patients are eligible for [place].   Inpatient LCSW asked writer to look at patient for potential [facility] Care Management program. Request sent to [place] [facility] office to check for eligibility.   Will follow up with patient if indeed eligible for [facility] Care Management.   Marthenia Rolling, MSN-Ed, RN,BSN [facility] Liaison [phone]

## 2017-08-24 NOTE — Discharge Summary (Signed)
Physician Discharge Summary  Gina Fritz JQB:[id number] DOB: October 30, 1940 DOA: 08/21/2017  PCP: [place], Betty G, MD  Admit date: 08/21/2017 Discharge date: 08/24/2017  Recommendations for Outpatient Follow-up:  1. Recommend outpatient screening as per guidelines for colon cancer-had colitis this admission treated with Cipro Flagyl and will complete treatment at home 2. Urologist recommended prednisone with low-dose taper--patient should follow-up with Dr. Louis Meckel for definitive procedure in 1 to 2 weeks as per his instructions 3. Needs basic metabolic panel and CBC about 1 week 4. Will need chest x-ray 1 month to ensure clearing of chest from prior pneumonia 5. Patient will be trying to get home health in [place] potentially?    Marland Kitchen  Discharge Diagnoses:  1. Retroperitoneal fibrosis 2. Infectious colitis unclear pathogen  Discharge Condition: Improved Disposition: Discharging to home with family  Diet recommendation: As above  Filed Weights   08/21/17 1116 08/21/17 1233  Weight: 51.3 kg (113 lb) 46.7 kg (103 lb)    History of present illness:    Colitis-treated with Cipro Flagyl completing 5 more days at home-improving steadily  Retroperitoneal fibrosis, prior stent placement needing replacement-Dr. Louis Meckel consulted and input was that patient needs relatively rapid taper of steroids (significant verbosity and hypomania on steroids), patient will also need follow-up for stent replacement as per him  AKI-resolved-resuming ARB on discharge as it was held on admission  COPD-clinically quiesced sent  Steroid mania-continue Lexapro  Reflux, hyperlipidemia, hypokalemia all resolved at this hospital stay   Hospital Course:  77 year old female admitted with right lower quadrant pain Recent ureteric stent [id number] of stent complicated by recent hospital admission 5/8-5/10--> received azithromycin for 5 days and was sent home Found to have on  admission leukocytosis 24 K AKI 0.6-->1.6  Today's assessment: S: Awake alert very talkative concerned about nutritionist and wants to speak to her again Husband and daughter pulled me aside and ask what is disposition plan Otherwise patient has no fever no chills no nausea no vomiting tolerating a good diet ambulated 160 steps yesterday with therapy O: Vitals:  Vitals:   08/24/17 0405 08/24/17 0811  BP: (!) 169/98   Pulse: 84   Resp: 19   Temp:    SpO2: 98% 97%    Constitutional:  . Appears talkative and hypomanic but is improved Eyes:  . pupils and irises appear normal . Normal lids and conjunctivae ENMT:  . grossly normal hearing  . Lips appear normal . nose appear normal . Oropharynx: mucosa, tongue,posterior pharynx appear normal Neck:  . neck appears normal, no masses, normal ROM, supple . no thyromegaly Respiratory:  . CTA bilaterally, no w/r/r.  . Respiratory effort normal. No retractions or accessory muscle use Cardiovascular:  . RRR, no m/r/g . No LE extremity edema   . Normal pedal pulses Abdomen:  . Abdomen appears normal; only tender in lower quadrant above symphysis pubis  masses . No hernias- . No HSM Musculoskeletal:  . Digits/nails: no clubbing, cyanosis, petechiae, infection . exam of joints, bones, muscles of at least one of following: head/neck, RUE, LUE, RLE, LLE   o strength and tone normal, no atrophy, no abnormal movements o No tenderness, masses o Normal ROM, no contractures  . gait and station Skin:  . No rashes, lesions, ulcers . palpation of skin: no induration or nodules Neurologic:  . CN 2-12 intact . Sensation all 4 extremities intact Psychiatric:  . judgement and insight appear normal . Mental status o Mood, affect appropriate o Orientation to person,  place, time     Discharge Instructions  Discharge Instructions    Diet - low sodium heart healthy   Complete by:  As directed    Discharge instructions   Complete by:  As  directed    You have been given refills of prednisone and please take them as directed by Dr. Louis Meckel Please follow-up with Dr. Louis Meckel in the outpatient setting in order to pursue further operative management For your colitis I have given you some more antibiotics to complete I would recommend that as an outpatient if you have not done so you consider getting a colonoscopy per your primary physician Best of luck and have a good summer   Increase activity slowly   Complete by:  As directed      Allergies as of 08/24/2017      Reactions   Penicillins Anaphylaxis, Other (See Comments)   Has patient had a PCN reaction causing immediate rash, facial/tongue/throat swelling, SOB or lightheadedness with hypotension: Yes Has patient had a PCN reaction causing severe rash involving mucus membranes or skin necrosis: Yes Has patient had a PCN reaction that required hospitalization Yes Has patient had a PCN reaction occurring within the last 10 years: No If all of the above answers are "NO", then may proceed with Cephalosporin use.   Sulfa Antibiotics Anaphylaxis, Hives   Soy Allergy Itching, Rash      Medication List    STOP taking these medications   Adult Blood Pressure Cuff Lg Kit   polyethylene glycol packet Commonly known as:  MIRALAX / GLYCOLAX   traMADol 50 MG tablet Commonly known as:  ULTRAM   vitamin B-12 1000 MCG tablet Commonly known as:  CYANOCOBALAMIN     TAKE these medications   acetaminophen 325 MG tablet Commonly known as:  TYLENOL Take 2 tablets (650 mg total) by mouth every 6 (six) hours as needed for mild pain (or Fever >/= 101).   albuterol 108 (90 Base) MCG/ACT inhaler Commonly known as:  PROAIR HFA Inhale 2 puffs into the lungs every 6 (six) hours as needed for wheezing or shortness of breath.   alendronate 70 MG tablet Commonly known as:  FOSAMAX Take 1 tablet (70 mg total) every Sunday by mouth.   amLODipine 5 MG tablet Commonly known as:   NORVASC Take 1 tablet (5 mg total) by mouth daily.   arformoterol 15 MCG/2ML Nebu Commonly known as:  BROVANA Take 2 mLs (15 mcg total) by nebulization 2 (two) times daily.   atorvastatin 20 MG tablet Commonly known as:  LIPITOR TAKE 1 TABLET(20 MG) BY MOUTH DAILY   BESIVANCE 0.6 % Susp Generic drug:  Besifloxacin HCl Place 1 drop into both eyes See admin instructions. Takes the day before and the day of the dr visit 4 times a day   ciprofloxacin 500 MG tablet Commonly known as:  CIPRO Take 1 tablet (500 mg total) by mouth 2 (two) times daily for 5 days.   escitalopram 20 MG tablet Commonly known as:  LEXAPRO TAKE 1 TABLET(20 MG) BY MOUTH DAILY   fluticasone 50 MCG/ACT nasal spray Commonly known as:  FLONASE SHAKE LIQUID AND USE 2 SPRAYS IN EACH NOSTRIL DAILY   Glycopyrrolate-Formoterol 9-4.8 MCG/ACT Aero Commonly known as:  BEVESPI AEROSPHERE Inhale 2 puffs into the lungs 2 (two) times daily.   losartan 50 MG tablet Commonly known as:  COZAAR TAKE 1 TABLET(50 MG) BY MOUTH TWICE DAILY   metroNIDAZOLE 500 MG tablet Commonly known as:  FLAGYL Take  1 tablet (500 mg total) by mouth every 8 (eight) hours for 5 days.   montelukast 10 MG tablet Commonly known as:  SINGULAIR TAKE 1 TABLET(10 MG) BY MOUTH DAILY   multivitamin with minerals Tabs tablet Take 1 tablet by mouth daily.   omeprazole 20 MG capsule Commonly known as:  PRILOSEC TAKE ONE CAPSULE BY MOUTH EVERY DAY   oxyCODONE-acetaminophen 5-325 MG tablet Commonly known as:  PERCOCET/ROXICET Take 1 tablet by mouth every 12 (twelve) hours for 10 days.   phenazopyridine 200 MG tablet Commonly known as:  PYRIDIUM Take 1 tablet (200 mg total) by mouth 3 (three) times daily as needed for pain.   predniSONE 10 MG tablet Commonly known as:  DELTASONE start 73m  x 1 week, then 282mx 1 week, and then 106maily x 4 months. What changed:    medication strength  how much to take  how to take this  when to  take this  additional instructions   ranitidine 300 MG tablet Commonly known as:  ZANTAC Take 1 tablet (300 mg total) at bedtime by mouth.   trimethoprim-polymyxin b ophthalmic solution Commonly known as:  POLYTRIM Place 1 drop into both eyes See admin instructions. Pt only uses once a month after injection in the eye for 2 days every 4 hours..   Vitamin D 2000 units Caps Take 2,000 Units by mouth daily.      Allergies  Allergen Reactions  . Penicillins Anaphylaxis and Other (See Comments)    Has patient had a PCN reaction causing immediate rash, facial/tongue/throat swelling, SOB or lightheadedness with hypotension: Yes Has patient had a PCN reaction causing severe rash involving mucus membranes or skin necrosis: Yes Has patient had a PCN reaction that required hospitalization Yes Has patient had a PCN reaction occurring within the last 10 years: No If all of the above answers are "NO", then may proceed with Cephalosporin use.   . Sulfa Antibiotics Anaphylaxis and Hives  . Soy Allergy Itching and Rash    The results of significant diagnostics from this hospitalization (including imaging, microbiology, ancillary and laboratory) are listed below for reference.    Significant Diagnostic Studies: Ct Abdomen Pelvis Wo Contrast  Result Date: 08/21/2017 CLINICAL DATA:  Nausea and vomiting. EXAM: CT ABDOMEN AND PELVIS WITHOUT CONTRAST TECHNIQUE: Multidetector CT imaging of the abdomen and pelvis was performed following the standard protocol without IV contrast. COMPARISON:  Ultrasound 08/10/2017.  CT 04/13/2017 FINDINGS: Lower chest: Pulmonary nodules at the lung bases are similar to prior exam. Emphysema. Hepatobiliary: No focal hepatic lesion allowing for lack contrast. Gallbladder physiologically distended, no calcified stone. No biliary dilatation. Pancreas: Parenchymal atrophy. No ductal dilatation or inflammation. Spleen: Normal in size without focal abnormality. Adrenals/Urinary  Tract: Left adrenal thickening without dominant nodule. Right adrenal gland is normal. Moderate right hydronephrosis has increased from prior CT. Ureteral stent noted on ultrasound is no longer visualized. Hydroureter to the level of the pelvic brim where there is focal soft tissue density measuring 14 x 9 mm, previously 18 x 13 mm. There is a nonobstructing 3 mm stone in the lower right kidney. 13 mm hyperdense lesion in the medial upper right kidney is new from prior CT in suggestive of hemorrhagic cyst. No left hydronephrosis or left hydroureter. Parapelvic cyst in the mid left kidney is again seen, not as well visualized without contrast. Urinary bladder is decompressed. No bladder stones. Stomach/Bowel: Small hiatal hernia. Stomach physiologically distended. No small bowel obstruction, wall thickening or inflammatory change.  There is wall thickening with pericolonic stranding about the splenic flexure and descending colon. Sigmoid colon is nondistended limiting assessment. The appendix is not confidently visualized, no pericecal or right lower quadrant inflammation. Vascular/Lymphatic: Moderate aorto bi-iliac atherosclerosis. No aneurysm. No enlarged abdominal or pelvic lymph nodes. Reproductive: Uterus and bilateral adnexa are unremarkable. Other: No free air or ascites.  No intra-abdominal abscess. Musculoskeletal: There are no acute or suspicious osseous abnormalities. Degenerative change throughout spine. IMPRESSION: 1. Colitis involving the splenic flexure and descending colon, likely infectious or inflammatory. 2. Increased right hydronephrosis since 04/13/2017 CT, with focal soft tissue prominence in the region of the distal right ureter again seen, better characterized on prior CT with contrast. 3. Hemorrhagic cysts in the right kidney. 4.  Aortic Atherosclerosis (ICD10-I70.0). Electronically Signed   By: Jeb Levering M.D.   On: 08/21/2017 05:15   Dg Chest 2 View  Result Date:  08/16/2017 CLINICAL DATA:  Follow-up pneumonia EXAM: CHEST - 2 VIEW COMPARISON:  08/10/2017 FINDINGS: Interval improvement in right lower lobe airspace disease. No new area of infiltrate or effusion. Pulmonary hyperinflation with COPD.  Negative for heart failure. IMPRESSION: Partial clearing of right lower lobe infiltrate compatible with pneumonia. Electronically Signed   By: Franchot Gallo M.D.   On: 08/16/2017 08:38   Dg Chest 2 View  Result Date: 08/10/2017 CLINICAL DATA:  Fever EXAM: CHEST - 2 VIEW COMPARISON:  CT chest dated 04/21/2017 FINDINGS: Mild patchy right lower lobe opacity, suspicious for pneumonia. Left lung is clear. No pleural effusion or pneumothorax. Heart is normal in size. Mild degenerative changes of the visualized thoracolumbar spine. IMPRESSION: Mild patchy right lower lobe opacity, suspicious for pneumonia. Electronically Signed   By: Julian Hy M.D.   On: 08/10/2017 23:51   US Renal  Result Date: 08/10/2017 CLINICAL DATA:  Hydronephrosis. EXAM: RENAL / URINARY TRACT ULTRASOUND COMPLETE COMPARISON:  Ultrasound of February 05, 2015. CT scan of April 13, 2017. FINDINGS: Right Kidney: Length: 9.1 cm. Echogenicity within normal limits. No mass visualized. Right ureteral stent is noted. Mild right hydronephrosis is noted. Left Kidney: Length: 9.5 cm. 1.6 cm simple cyst is noted. Echogenicity within normal limits. No mass or hydronephrosis visualized. Bladder: Appears normal for degree of bladder distention. IMPRESSION: Right ureteral stent is noted.  Mild right hydronephrosis is noted. Electronically Signed   By: Marijo Conception, M.D.   On: 08/10/2017 15:32   Dg Foot Complete Left  Result Date: 08/04/2017 CLINICAL DATA:  Pedal edema and ecchymosis, pain for 3 days, no injury EXAM: LEFT FOOT - COMPLETE 3+ VIEW COMPARISON:  None. FINDINGS: Tarsal-metatarsal alignment is normal. No fracture is seen. There is mild degenerative change of the left first MTP joint with slight loss of  joint space and mild sclerosis. No soft tissue abnormality is seen. IMPRESSION: No acute abnormality. Mild degenerative change of the left first MTP joint. Electronically Signed   By: Ivar Drape M.D.   On: 08/04/2017 13:40    Microbiology: No results found for this or any previous visit (from the past 240 hour(s)).   Labs: Basic Metabolic Panel: Recent Labs  Lab 08/21/17 0240 08/21/17 0337 08/22/17 0523 08/23/17 0606  NA 139 134* 139 136  K 3.6 3.4* 3.4* 3.7  CL 103 100* 103 101  CO2 23  --  24 24  GLUCOSE 111* 108* 101* 167*  BUN 27* 23* 8 9  CREATININE 1.58* 1.60* 0.68 0.68  CALCIUM 9.4  --  8.4* 8.6*  MG 2.2  --  1.9  --    Liver Function Tests: No results for input(s): AST, ALT, ALKPHOS, BILITOT, PROT, ALBUMIN in the last 168 hours. No results for input(s): LIPASE, AMYLASE in the last 168 hours. No results for input(s): AMMONIA in the last 168 hours. CBC: Recent Labs  Lab 08/21/17 0240 08/21/17 0337 08/22/17 0523 08/23/17 0606  WBC 24.6*  --  13.3* 12.3*  NEUTROABS 22.2*  --   --   --   HGB 11.5* 11.2* 11.1* 12.0  HCT 36.1 33.0* 35.6* 38.3  MCV 85.7  --  86.2 86.1  PLT 282  --  249 228   Cardiac Enzymes: No results for input(s): CKTOTAL, CKMB, CKMBINDEX, TROPONINI in the last 168 hours. BNP: BNP (last 3 results) No results for input(s): BNP in the last 8760 hours.  ProBNP (last 3 results) No results for input(s): PROBNP in the last 8760 hours.  CBG: No results for input(s): GLUCAP in the last 168 hours.  Principal Problem:   Colitis, infectious Active Problems:   COPD with emphysema ([place])   Essential hypertension, benign   Hydronephrosis of right kidney   AKI (acute kidney injury) ([place])   Protein-calorie malnutrition, severe   Time coordinating discharge: 36  Signed:  Murray Hodgkins, MD [facility] 08/24/2017, 10:45 AM

## 2017-08-24 NOTE — Care Management Important Message (Signed)
Important Message  Patient Details  Name: Noella Kipnis MRN: [id number] Date of Birth: 1941/01/21   Medicare Important Message Given:  Yes    Kerin Salen 08/24/2017, 11:19 AMImportant Message  Patient Details  Name: Laelle Bridgett MRN: [id number] Date of Birth: [birth year]   Medicare Important Message Given:  Yes    Kerin Salen 08/24/2017, 11:18 AM

## 2017-08-24 NOTE — NC FL2 (Signed)
[place] LEVEL OF CARE SCREENING TOOL     IDENTIFICATION  Patient Name: Gina Fritz Birthdate: 1940-04-29 Sex: female Admission Date (Current Location): 08/21/2017  [facility] and [place] Number:  Herbalist and Address:  [facility],  [place] [address] [place]      Provider Number: [id number]  Attending Physician Name and Address:  Nita Sells, MD  Relative Name and Phone Number:       Current Level of Care: Hospital Recommended Level of Care: [place] Prior Approval Number:    Date Approved/Denied:   PASRR Number: [id number]  Discharge Plan: SNF    Current Diagnoses: Patient Active Problem List   Diagnosis Date Noted  . Hydronephrosis of right kidney 08/21/2017  . Colitis, infectious 08/21/2017  . AKI (acute kidney injury) ([place]) 08/21/2017  . Protein-calorie malnutrition, severe 08/21/2017  . Chest pain 08/09/2017  . Ureteral stent displacement ([place]) 08/09/2017  . Lower extremity edema, L>R 08/09/2017  . 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 GERD (gastroesophageal reflux disease) 10/19/2016  . Rectal sphincter spasm 05/26/2016  . Overactive bladder-urinary frequency 05/26/2016  . Bilateral low back pain 04/27/2016  . Generalized anxiety disorder 04/27/2016  . Hyperlipidemia 02/24/2016  . Osteoporosis 02/24/2016  . Essential hypertension, benign 12/18/2015  . Hypertensive urgency 11/14/2015  . COPD with emphysema ([place]) 05/22/2015  . COPD with asthma ([place]) 05/22/2015  . Malnutrition of moderate degree 02/09/2015  . Abdominal pain 02/05/2015  . Kidney stone on right side 02/05/2015  . Colitis 02/05/2015  . Unstable gait 04/22/2013    Orientation RESPIRATION BLADDER Height & Weight     Self, Time, Situation, Place  Normal Continent Weight: 103 lb (46.7 kg) Height:  5' 2.01" (157.5 cm)  BEHAVIORAL SYMPTOMS/MOOD NEUROLOGICAL BOWEL NUTRITION STATUS      Continent Diet(See  dc summary)  AMBULATORY STATUS COMMUNICATION OF NEEDS Skin   Extensive Assist Verbally Normal                       Personal Care Assistance Level of Assistance  Bathing, Feeding, Dressing Bathing Assistance: Limited assistance Feeding assistance: Independent Dressing Assistance: Limited assistance     Functional Limitations Info  Sight, Hearing, Speech Sight Info: Impaired(Wears glasses) Hearing Info: Impaired Speech Info: Adequate    SPECIAL CARE FACTORS FREQUENCY  PT (By licensed PT), OT (By licensed OT)     PT Frequency: 5x/week OT Frequency: 5x/week            Contractures Contractures Info: Not present    Additional Factors Info  Code Status, Allergies Code Status Info: Full Allergies Info: Penicillins, Sulfa Antibiotics, Soy Allergy           Current Medications (08/24/2017):  This is the current hospital active medication list Current Facility-Administered Medications  Medication Dose Route Frequency Provider Last Rate Last Dose  . acetaminophen (TYLENOL) tablet 650 mg  650 mg Oral Q6H PRN Etta Quill, DO       Or  . acetaminophen (TYLENOL) suppository 650 mg  650 mg Rectal Q6H PRN Etta Quill, DO      . albuterol (PROVENTIL) (2.5 MG/3ML) 0.083% nebulizer solution 2.5 mg  2.5 mg Nebulization Q6H PRN Etta Quill, DO      . amLODipine (NORVASC) tablet 5 mg  5 mg Oral Daily Modena Jansky, MD   5 mg at 08/24/17 0942  . arformoterol (BROVANA) nebulizer solution 15 mcg  15 mcg Nebulization BID  Etta Quill, DO   15 mcg at 08/24/17 0810  . atorvastatin (LIPITOR) tablet 20 mg  20 mg Oral q1800 Etta Quill, DO   20 mg at 08/23/17 1634  . cholecalciferol (VITAMIN D) tablet 2,000 Units  2,000 Units Oral Daily Etta Quill, DO   2,000 Units at 08/24/17 [phone]  . ciprofloxacin (CIPRO) tablet 500 mg  500 mg Oral BID Nita Sells, MD   500 mg at 08/24/17 0943  . escitalopram (LEXAPRO) tablet 20 mg  20 mg Oral Daily Jennette Kettle  M, DO   20 mg at 08/24/17 9833  . fluticasone (FLONASE) 50 MCG/ACT nasal spray 2 spray  2 spray Each Nare Daily Etta Quill, DO   2 spray at 08/24/17 [phone]  . HYDROmorphone (DILAUDID) injection 0.5-1 mg  0.5-1 mg Intravenous Q4H PRN Etta Quill, DO   1 mg at 08/22/17 1613  . metroNIDAZOLE (FLAGYL) tablet 500 mg  500 mg Oral Q8H Nita Sells, MD   500 mg at 08/24/17 0513  . montelukast (SINGULAIR) tablet 10 mg  10 mg Oral QHS Jennette Kettle M, DO   10 mg at 08/23/17 2114  . multivitamin with minerals tablet 1 tablet  1 tablet Oral Daily Jennette Kettle M, DO   1 tablet at 08/24/17 [phone]  . ondansetron (ZOFRAN) tablet 4 mg  4 mg Oral Q6H PRN Etta Quill, DO       Or  . ondansetron [facility]) injection 4 mg  4 mg Intravenous Q6H PRN Etta Quill, DO      . oxyCODONE-acetaminophen (PERCOCET/ROXICET) 5-325 MG per tablet 1 tablet  1 tablet Oral Q4H PRN Nita Sells, MD   1 tablet at 08/23/17 1634  . pantoprazole (PROTONIX) EC tablet 40 mg  40 mg Oral Daily Jennette Kettle M, DO   40 mg at 08/24/17 0943  . predniSONE (DELTASONE) tablet 20 mg  20 mg Oral BID WC Jennette Kettle M, DO   20 mg at 08/24/17 6734  . promethazine (PHENERGAN) injection 6.25-12.5 mg  6.25-12.5 mg Intravenous Q6H PRN Etta Quill, DO      . umeclidinium bromide (INCRUSE ELLIPTA) 62.5 MCG/INH 1 puff  1 puff Inhalation Daily Jennette Kettle M, DO   1 puff at 08/24/17 0810  . vitamin B-12 (CYANOCOBALAMIN) tablet 1,000 mcg  1,000 mcg Oral Daily Jennette Kettle M, DO   1,000 mcg at 08/24/17 1937     Discharge Medications: Please see discharge summary for a list of discharge medications.  Relevant Imaging Results:  Relevant Lab Results:   Additional Information SSN [id number]  Servando Snare, LCSW

## 2017-08-24 NOTE — Care Management Note (Signed)
Case Management Note  Patient Details  Name: Gina Fritz MRN: [id number] Date of Birth: Dec 09, 1940  Subjective/Objective:      hhc-physical therapy              Action/Plan:  [place] at [phone]/ fax and orders faxed to [phone].   Expected Discharge Date:  08/24/17               Expected Discharge Plan:  [place]  In-House Referral:     Discharge planning Services  CM Consult  Post Acute Care Choice:    Choice offered to:  Patient  DME Arranged:    DME Agency:     HH Arranged:  PT [place]:  Other - See comment  Status of Service:     If discussed at Long Length of Stay Meetings, dates discussed:    Additional Comments:  Leeroy Cha, RN 08/24/2017, 12:10 PM

## 2017-08-24 NOTE — Consult Note (Signed)
   [facility] CM Inpatient Consult   08/24/2017  Gina Fritz July 10, 1940 [id number]   [facility] Care Management follow up.  Received notification from [place] Management that patient is not eligible for [facility] Care Management program.   Multiple lists used to determine. Patient is not a beneficiary currently attributed to one of the [place] Registry populations.  Membership roster used to verify non- eligible status.   Notification sent to inpatient LCSW to make aware.  Marthenia Rolling, MSN-Ed, RN,BSN [facility] Liaison [phone]

## 2017-08-24 NOTE — Progress Notes (Signed)
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  Ongoing  GOAL:   Patient will meet greater than or equal to 90% of their needs  Progressing  MONITOR:   PO intake, Supplement acceptance, Diet advancement, Weight trends, Labs  REASON FOR ASSESSMENT:   Malnutrition Screening Tool    ASSESSMENT:   Patient with PMH significant for colitis, COPD, HLD, GERD, and osteoporosis. Recently admitted 5/9  after undergoing right diagnostic ureteroscopy and retrograde pyelogram for right-sided hydroureteronephrosis. Presents this admission with colitis concerning for C.Diff.    Pt consumed 100% of her breakfast on a regular diet this morning. No complaints.   RD educated pt today regarding colitis and diet.   RD reviewed patient's dietary recall. Provided examples of low and high fiber foods. Discouraged intake of high fiber foods, high fat foods, processed foods, caffeine, and processed sugars. Encouraged use of a multi-vitamin.  Provided "Inflammatory Bowel Disease" handout from the Academy of Nutrition and Dietetics.  Teach back method used. Expect good compliance. Pt to d/c home this afternoon.   Medications reviewed and include: Vit D, MVI with minerals, prednisone, Vit B12 Labs reviewed.   Diet Order:   Diet Order           Diet - low sodium heart healthy        Diet regular Room service appropriate? Yes; Fluid consistency: Thin  Diet effective now          EDUCATION NEEDS:   Education needs have been addressed  Skin:  Skin Assessment: Reviewed RN Assessment  Last BM:  08/21/17  Height:   Ht  Readings from Last 1 Encounters:  08/21/17 5' 2.01" (1.575 m)    Weight:   Wt Readings from Last 1 Encounters:  08/21/17 103 lb (46.7 kg)    Ideal Body Weight:  50 kg  BMI:  Body mass index is 18.83 kg/m.  Estimated Nutritional Needs:   Kcal:  1550-1750 kcal  Protein:  80-90 g  Fluid:  >1.5 L/day    Mariana Single RD, LDN Clinical Nutrition Pager # [phone]

## 2017-08-24 NOTE — Clinical Social Work Note (Signed)
Clinical Social Work Assessment  Patient Details  Name: Gina Fritz MRN: [id number] Date of Birth: [birth year]  Date of referral:  08/24/17               Reason for consult:  Facility Placement                Permission sought to share information with:  Case Manager, Customer service manager, Family Supports Permission granted to share information::  Yes, Verbal Permission Granted  Name::     Health and safety inspector::  SNF  Relationship::  Daughter  Contact Information:     Housing/Transportation Living arrangements for the past 2 months:  Single Family Home Source of Information:  Patient, Adult Children Patient Interpreter Needed:  None Criminal Activity/Legal Involvement Pertinent to Current Situation/Hospitalization:  No - Comment as needed Significant Relationships:  Adult Children, Spouse Lives with:  Spouse Do you feel safe going back to the place where you live?  Yes Need for family participation in patient care:  Yes (Comment)  Care giving concerns:  Patient states she lives with spouse who has some health issues as well and cannot help her much.   Social Worker assessment / plan:  LCSW consulted for SNF placement.   Patient admitted for diarrhea.   LCSW met at bed side with patient. No supports present initially. Family joined later during the assessment.   Patient expressed that she would like to go home with home health vs. Rehab at SNF. Patient has been to SNF before and expressed concerns about availability of meds. Patient reports it was 5 days before the facility obtained all meds she needed to take.   Patient reports she lives at home and uses a rollator to ambulate. She also states that she has a two wheeled walker and cane as well. Patient states that prior to hospitalization she was independent in her ADLs. Patient reports that she was able to bath/dress and cook and clean. Patient reported that she didn't feel like cooking because of severe pain.    Patients family expressed concern about patient getting home and trying to do more than she is able. Daughter Gina Fritz, states that that is how patient ended up in the hospital. Gina Fritz reports patient had a surgery, went home and was trimming hedges the nextvday and had a fall. Family prefers that patient goes to SNF for rehab.   LCSW discussed HH vs SNF with family and ultimately it was the patients decision. Family expressed understanding.   Patient and family gives permission for LCSW to fax patient out to facility. Patient and family are not on the same page regarding disposition.   PLAN: TBD.   Employment status:  Retired Nurse, adult PT Recommendations:  [place] / Referral to community resources:     Patient/Family's Response to care:  Patient and family is thankful for LCSW visit. Patient thanked LCSW for listening to her concerns.   Patient/Family's Understanding of and Emotional Response to Diagnosis, Current Treatment, and Prognosis:  Patient has a very clear understanding of her diagnosis and treatment plan. Patient is able to recall past medical history and her treatment plan after dc. Patient request a comprehensive list of meds and instructions on when to take meds.  Patient is determined to go home and states she feels that she can recover at home with [facility] and the prior knowledge she has from being in rehab. Patient's family are not  Agreeable to [facility] and prefers rehab.  Emotional Assessment Appearance:  Appears stated age Attitude/Demeanor/Rapport:    Affect (typically observed):  Frustrated Orientation:  Oriented to Self, Oriented to Place, Oriented to  Time, Oriented to Situation Alcohol / Substance use:  Not Applicable Psych involvement (Current and /or in the community):  No (Comment)  Discharge Needs  Concerns to be addressed:  No discharge needs identified Readmission within the last 30 days:  Yes Current  discharge risk:  None Barriers to Discharge:  No SNF bed   Servando Snare, LCSW 08/24/2017, 10:36 AM

## 2017-08-25 ENCOUNTER — Telehealth: Payer: Self-pay | Admitting: Pulmonary Disease

## 2017-08-25 NOTE — Telephone Encounter (Signed)
Pt husband is calling back. Gina Fritz states they are leaving town today and would like to know what med to take. Pharm [facility] [place]. Cb is [phone]

## 2017-08-25 NOTE — Telephone Encounter (Signed)
Okay to continue with bevespi.  If she is using bevespi, then she does not need to fill script for brovana.

## 2017-08-25 NOTE — Telephone Encounter (Signed)
Called Patient and her Husband stated that she was recently discharged from the hospital and they were given a prescription for [place].  He stated that Patient was doing fine on Bevespi and he was concerned about her taking both. They have not got the prescription filled for Brovana and are waiting on VS recommendations.  VS please advise

## 2017-08-25 NOTE — Telephone Encounter (Signed)
Called and talked to patient's husband. He thanked staff for calling him back and was happy with VS recommendations to continue bevespi. Nothing further is needed at this time.

## 2017-09-01 ENCOUNTER — Other Ambulatory Visit: Payer: Self-pay | Admitting: Family Medicine

## 2017-09-01 DIAGNOSIS — N3281 Overactive bladder: Secondary | ICD-10-CM

## 2017-09-03 ENCOUNTER — Encounter: Payer: Self-pay | Admitting: Family Medicine

## 2017-09-06 ENCOUNTER — Other Ambulatory Visit: Payer: Self-pay | Admitting: *Deleted

## 2017-09-06 MED ORDER — IRBESARTAN 75 MG PO TABS: 75.0000 mg | ORAL_TABLET | Freq: Every day | ORAL | 2 refills | Status: AC

## 2017-09-07 ENCOUNTER — Encounter (INDEPENDENT_AMBULATORY_CARE_PROVIDER_SITE_OTHER): Payer: Medicare Other | Admitting: Ophthalmology

## 2017-09-26 ENCOUNTER — Ambulatory Visit: Payer: Medicare Other | Admitting: Family Medicine

## 2017-09-26 DIAGNOSIS — Z79899 Other long term (current) drug therapy: Secondary | ICD-10-CM | POA: Diagnosis not present

## 2017-09-26 DIAGNOSIS — D649 Anemia, unspecified: Secondary | ICD-10-CM | POA: Diagnosis not present

## 2017-09-27 NOTE — Progress Notes (Deleted)
HPI: FU hypertension and syncope. Previous event monitor 7/15 for dizziness negative.  Last echocardiogram May 2019 showed normal LV systolic function, mild diastolic dysfunction.  Patient had urologic procedure May 2019.  Had chest pain and ruled out.  Outpatient nuclear study recommended.  Since last seen,   Current Outpatient Medications  Medication Sig Dispense Refill  . acetaminophen (TYLENOL) 325 MG tablet Take 2 tablets (650 mg total) by mouth every 6 (six) hours as needed for mild pain (or Fever >/= 101). 30 tablet 1  . albuterol (PROAIR HFA) 108 (90 Base) MCG/ACT inhaler Inhale 2 puffs into the lungs every 6 (six) hours as needed for wheezing or shortness of breath. 1 Inhaler 2  . alendronate (FOSAMAX) 70 MG tablet Take 1 tablet (70 mg total) every Sunday by mouth. 13 tablet 3  . amLODipine (NORVASC) 5 MG tablet Take 1 tablet (5 mg total) by mouth daily. 90 tablet 1  . arformoterol (BROVANA) 15 MCG/2ML NEBU Take 2 mLs (15 mcg total) by nebulization 2 (two) times daily. 120 mL 0  . atorvastatin (LIPITOR) 20 MG tablet TAKE 1 TABLET(20 MG) BY MOUTH DAILY 90 tablet 0  . BESIVANCE 0.6 % SUSP Place 1 drop into both eyes See admin instructions. Takes the day before and the day of the dr visit 4 times a day    . Cholecalciferol (VITAMIN D) 2000 UNITS CAPS Take 2,000 Units by mouth daily.    Marland Kitchen escitalopram (LEXAPRO) 20 MG tablet TAKE 1 TABLET(20 MG) BY MOUTH DAILY 90 tablet 1  . fluticasone (FLONASE) 50 MCG/ACT nasal spray SHAKE LIQUID AND USE 2 SPRAYS IN EACH NOSTRIL DAILY 16 g 5  . Glycopyrrolate-Formoterol (BEVESPI AEROSPHERE) 9-4.8 MCG/ACT AERO Inhale 2 puffs into the lungs 2 (two) times daily. 1 Inhaler 0  . irbesartan (AVAPRO) 75 MG tablet Take 1 tablet (75 mg total) by mouth daily. 90 tablet 2  . montelukast (SINGULAIR) 10 MG tablet TAKE 1 TABLET(10 MG) BY MOUTH DAILY 90 tablet 0  . Multiple Vitamin (MULTIVITAMIN WITH MINERALS) TABS Take 1 tablet by mouth daily.    Marland Kitchen omeprazole  (PRILOSEC) 20 MG capsule TAKE ONE CAPSULE BY MOUTH EVERY DAY 90 capsule 0  . phenazopyridine (PYRIDIUM) 200 MG tablet Take 1 tablet (200 mg total) by mouth 3 (three) times daily as needed for pain. 10 tablet 0  . predniSONE (DELTASONE) 10 MG tablet start 30mg   x 1 week, then 20mg  x 1 week, and then 10mg  daily x 4 months. 335 tablet 0  . ranitidine (ZANTAC) 300 MG tablet Take 1 tablet (300 mg total) at bedtime by mouth. 90 tablet 3  . trimethoprim-polymyxin b (POLYTRIM) ophthalmic solution Place 1 drop into both eyes See admin instructions. Pt only uses once a month after injection in the eye for 2 days every 4 hours..     No current facility-administered medications for this visit.      Past Medical History:  Diagnosis Date  . Allergic rhinitis   . Anxiety   . Aortic atherosclerosis ([place]) 04/21/2017   noted on CT Chest  . Arthritis    lower back  . Asthma   . Back pain   . CAP (community acquired pneumonia)    dx 02-05-2015  . Colitis    dx 02-05-2015  . COPD with emphysema ([place])   . Depression   . Dizziness   . GERD (gastroesophageal reflux disease)   . Headache    Migraines  . History of chronic sinusitis   .  History of kidney stones   . Hyperlipidemia   . Hypertension   . Irritable bowel syndrome   . Loss of weight   . Lower extremity edema, L>R 08/09/2017  . Macular degeneration, bilateral   . Oral thrush   . Pulmonary nodules    benign and stable per CT  . Right ureteral stone   . Sensorineural hearing loss, unilateral   . Subjective tinnitus     Past Surgical History:  Procedure Laterality Date  . CATARACT EXTRACTION Left   . CYSTO/  LEFT URETEROSCOPIC STONE EXTRACTION/ STENT PLACEMENT  08-10-2009  . CYSTOSCOPY WITH RETROGRADE PYELOGRAM, URETEROSCOPY AND STENT PLACEMENT Right 04/28/2017   Procedure: CYSTOSCOPY WITH BILATERAL RETROGRADE PYELOGRAM, RIGHT DIAGNOSTIC URETEROSCOPY,  BIOPSY AND STENT PLACEMENT;  Surgeon: Ardis Hughs, MD;  Location: [facility];   Service: Urology;  Laterality: Right;  . ECTOPIC PREGNANCY SURGERY     and Appendectomy  . NASAL SINUS SURGERY    . REMOVAL CYST RIGHT ARM  age 38  . TUBAL LIGATION    . 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 Marital status: Married    Spouse name: [name]  . Number of children: 3  . Years of education: college  . Highest education level: Not on file  Occupational History    Comment: Retired  Scientific laboratory technician  . Financial resource strain: Not on file  . Food insecurity:    Worry: Not on file    Inability: Not on file  . Transportation needs:    Medical: Not on file    Non-medical: Not on file  Tobacco Use  . Smoking status: Current Every Day Smoker    Packs/day: 0.50    Years: 25.00    Pack years: 12.50    Types: Cigarettes  . Smokeless tobacco: Never Used  . Tobacco comment: Thinks her smoking was more than 25 years abut is not exact   Substance and Sexual Activity  . Alcohol use: Yes    Alcohol/week: 0.6 oz    Types: 1 Glasses of wine per week    Comment: One drink a week.  . Drug use: No  . Sexual activity: Not on file  Lifestyle  . Physical activity:    Days per week: Not on file    Minutes per session: Not on file  . Stress: Not on file  Relationships  . Social connections:    Talks on phone: Not on file    Gets together: Not on file    Attends religious service: Not on file    Active member of club or organization: Not on file    Attends meetings of clubs or organizations: Not on file    Relationship status: Not on file  . Intimate partner violence:    Fear of current or ex partner: Not on file    Emotionally abused: Not on file    Physically abused: Not on file    Forced sexual activity: Not on file  Other Topics Concern  . Not on file  Social History Narrative   Patient is married [name]) and lives at home with her husband.   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   Caffeine- Six cups of coffee daily and six cups of coke cola daily.          Family History  Problem Relation Age of Onset  .  Congestive Heart Failure Mother   . Hearing loss Mother   . Stroke Mother        syndrome  . Hypertension Father   . Other Unknown        thyroid disorder    ROS: no fevers or chills, productive cough, hemoptysis, dysphasia, odynophagia, melena, hematochezia, dysuria, hematuria, rash, seizure activity, orthopnea, PND, pedal edema, claudication. Remaining systems are negative.  Physical Exam: Well-developed well-nourished in no acute distress.  Skin is warm and dry.  HEENT is normal.  Neck is supple.  Chest is clear to auscultation with normal expansion.  Cardiovascular exam is regular rate and rhythm.  Abdominal exam nontender or distended. No masses palpated. Extremities show no edema. neuro grossly intact  ECG- personally reviewed  A/P  1  Kirk Ruths, MD

## 2017-10-06 ENCOUNTER — Ambulatory Visit: Payer: Medicare Other | Admitting: Cardiology

## 2017-10-10 ENCOUNTER — Ambulatory Visit: Payer: Medicare Other | Admitting: Family Medicine

## 2017-10-10 DIAGNOSIS — Z0289 Encounter for other administrative examinations: Secondary | ICD-10-CM

## 2017-10-10 NOTE — Progress Notes (Deleted)
HPI:   [name] is a 77 y.o. female, who is here today for 2 months follow up.   She was last seen on 08/04/17  Since her last OV she has been hospitalized from 08/21/2017 to 08/24/2017., treated for infectious colitis of unclear pathogen. She is following urologist for retroperitoneal fibrosis, currently she is on prednisone 10 mg daily.   Hypertension: Currently she is on amlodipine 5 mg and Avapro 75 mg daily.  ***  Lab Results  Component Value Date   CREATININE 0.68 08/23/2017   BUN 9 08/23/2017   NA 136 08/23/2017   K 3.7 08/23/2017   CL 101 08/23/2017   CO2 24 08/23/2017   Anxiety: She is on Lexapro 20 mg daily. Dx with steroid mania 08/21/17.   ***   {4+ HPI elements (or status of 3 or more chronic diseases)} ***      Review of Systems  [review of 2 to 9 systems] ***  Current Outpatient Medications on File Prior to Visit  Medication Sig Dispense Refill  . acetaminophen (TYLENOL) 325 MG tablet Take 2 tablets (650 mg total) by mouth every 6 (six) hours as needed for mild pain (or Fever >/= 101). 30 tablet 1  . albuterol (PROAIR HFA) 108 (90 Base) MCG/ACT inhaler Inhale 2 puffs into the lungs every 6 (six) hours as needed for wheezing or shortness of breath. 1 Inhaler 2  . alendronate (FOSAMAX) 70 MG tablet Take 1 tablet (70 mg total) every Sunday by mouth. 13 tablet 3  . amLODipine (NORVASC) 5 MG tablet Take 1 tablet (5 mg total) by mouth daily. 90 tablet 1  . arformoterol (BROVANA) 15 MCG/2ML NEBU Take 2 mLs (15 mcg total) by nebulization 2 (two) times daily. 120 mL 0  . atorvastatin (LIPITOR) 20 MG tablet TAKE 1 TABLET(20 MG) BY MOUTH DAILY 90 tablet 0  . BESIVANCE 0.6 % SUSP Place 1 drop into both eyes See admin instructions. Takes the day before and the day of the dr visit 4 times a day    . Cholecalciferol (VITAMIN D) 2000 UNITS CAPS Take 2,000 Units by mouth daily.    Marland Kitchen escitalopram (LEXAPRO) 20 MG tablet TAKE 1 TABLET(20 MG) BY MOUTH  DAILY 90 tablet 1  . fluticasone (FLONASE) 50 MCG/ACT nasal spray SHAKE LIQUID AND USE 2 SPRAYS IN EACH NOSTRIL DAILY 16 g 5  . Glycopyrrolate-Formoterol (BEVESPI AEROSPHERE) 9-4.8 MCG/ACT AERO Inhale 2 puffs into the lungs 2 (two) times daily. 1 Inhaler 0  . irbesartan (AVAPRO) 75 MG tablet Take 1 tablet (75 mg total) by mouth daily. 90 tablet 2  . montelukast (SINGULAIR) 10 MG tablet TAKE 1 TABLET(10 MG) BY MOUTH DAILY 90 tablet 0  . Multiple Vitamin (MULTIVITAMIN WITH MINERALS) TABS Take 1 tablet by mouth daily.    Marland Kitchen omeprazole (PRILOSEC) 20 MG capsule TAKE ONE CAPSULE BY MOUTH EVERY DAY 90 capsule 0  . phenazopyridine (PYRIDIUM) 200 MG tablet Take 1 tablet (200 mg total) by mouth 3 (three) times daily as needed for pain. 10 tablet 0  . predniSONE (DELTASONE) 10 MG tablet start 30mg   x 1 week, then 20mg  x 1 week, and then 10mg  daily x 4 months. 335 tablet 0  . ranitidine (ZANTAC) 300 MG tablet Take 1 tablet (300 mg total) at bedtime by mouth. 90 tablet 3  . trimethoprim-polymyxin b (POLYTRIM) ophthalmic solution Place 1 drop into both eyes See admin instructions. Pt only uses once a month after injection in the  eye for 2 days every 4 hours..     No current facility-administered medications on file prior to visit.      Past Medical History:  Diagnosis Date  . Allergic rhinitis   . Anxiety   . Aortic atherosclerosis ([place]) 04/21/2017   noted on CT Chest  . Arthritis    lower back  . Asthma   . Back pain   . CAP (community acquired pneumonia)    dx 02-05-2015  . Colitis    dx 02-05-2015  . COPD with emphysema ([place])   . Depression   . Dizziness   . GERD (gastroesophageal reflux disease)   . Headache    Migraines  . History of chronic sinusitis   . History of kidney stones   . Hyperlipidemia   . Hypertension   . Irritable bowel syndrome   . Loss of weight   . Lower extremity edema, L>R 08/09/2017  . Macular degeneration, bilateral   . Oral thrush   . Pulmonary nodules     benign and stable per CT  . Right ureteral stone   . Sensorineural hearing loss, unilateral   . Subjective tinnitus    Allergies  Allergen Reactions  . Penicillins Anaphylaxis and Other (See Comments)    Has patient had a PCN reaction causing immediate rash, facial/tongue/throat swelling, SOB or lightheadedness with hypotension: Yes Has patient had a PCN reaction causing severe rash involving mucus membranes or skin necrosis: Yes Has patient had a PCN reaction that required hospitalization Yes Has patient had a PCN reaction occurring within the last 10 years: No If all of the above answers are "NO", then may proceed with Cephalosporin use.   . Sulfa Antibiotics Anaphylaxis and Hives  . Soy Allergy Itching and Rash    Social History   Socioeconomic History  . Marital status: Married    Spouse name: [name]  . Number of children: 3  . Years of education: college  . Highest education level: Not on file  Occupational History    Comment: Retired  Scientific laboratory technician  . Financial resource strain: Not on file  . Food insecurity:    Worry: Not on file    Inability: Not on file  . Transportation needs:    Medical: Not on file    Non-medical: Not on file  Tobacco Use  . Smoking status: Current Every Day Smoker    Packs/day: 0.50    Years: 25.00    Pack years: 12.50    Types: Cigarettes  . Smokeless tobacco: Never Used  . Tobacco comment: Thinks her smoking was more than 25 years abut is not exact   Substance and Sexual Activity  . Alcohol use: Yes    Alcohol/week: 0.6 oz    Types: 1 Glasses of wine per week    Comment: One drink a week.  . Drug use: No  . Sexual activity: Not on file  Lifestyle  . Physical activity:    Days per week: Not on file    Minutes per session: Not on file  . Stress: Not on file  Relationships  . Social connections:    Talks on phone: Not on file    Gets together: Not on file    Attends religious service: Not on file    Active member of club or  organization: Not on file    Attends meetings of clubs or organizations: Not on file    Relationship status: Not on file  Other Topics Concern  . Not  on file  Social History Narrative   Patient is married [name]) and lives at home with her husband.   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   Caffeine- Six cups of coffee daily and six cups of coke cola daily.          There were no vitals filed for this visit. There is no height or weight on file to calculate BMI.      Physical Exam  {[12+ exam elements]} ***  ASSESSMENT AND PLAN:   Gina Fritz was seen today for *** months follow-up.  No orders of the defined types were placed in this encounter.   No problem-specific Assessment & Plan notes found for this encounter.           -Gina Fritz was advised to return sooner than planned today if new concerns arise.       Talishia Betzler G. [place], MD  [facility]. [place] office.

## 2017-10-11 DIAGNOSIS — Z5181 Encounter for therapeutic drug level monitoring: Secondary | ICD-10-CM | POA: Diagnosis not present

## 2017-10-12 ENCOUNTER — Encounter (INDEPENDENT_AMBULATORY_CARE_PROVIDER_SITE_OTHER): Payer: Medicare Other | Admitting: Ophthalmology

## 2017-10-12 DIAGNOSIS — H35033 Hypertensive retinopathy, bilateral: Secondary | ICD-10-CM

## 2017-10-12 DIAGNOSIS — H43813 Vitreous degeneration, bilateral: Secondary | ICD-10-CM

## 2017-10-12 DIAGNOSIS — I10 Essential (primary) hypertension: Secondary | ICD-10-CM

## 2017-10-12 DIAGNOSIS — H353231 Exudative age-related macular degeneration, bilateral, with active choroidal neovascularization: Secondary | ICD-10-CM | POA: Diagnosis not present

## 2017-10-16 ENCOUNTER — Ambulatory Visit: Payer: Medicare Other | Admitting: Pulmonary Disease

## 2017-10-24 ENCOUNTER — Other Ambulatory Visit: Payer: Self-pay | Admitting: Family Medicine

## 2017-10-24 DIAGNOSIS — J449 Chronic obstructive pulmonary disease, unspecified: Secondary | ICD-10-CM

## 2017-11-11 ENCOUNTER — Other Ambulatory Visit: Payer: Self-pay | Admitting: Family Medicine

## 2017-11-11 DIAGNOSIS — E785 Hyperlipidemia, unspecified: Secondary | ICD-10-CM

## 2017-12-08 IMAGING — DX DG CHEST 2V
2 series · 2 of 2 positions shown · non-contrast
Comparison: 03/13/2015

CLINICAL DATA: Chest pain while breathing for 3 days, some cough,
former smoker, COPD/emphysema, hypertension, history pulmonary
nodules, hyperlipidemia

EXAM:
CHEST  2 VIEW

[chest pa]
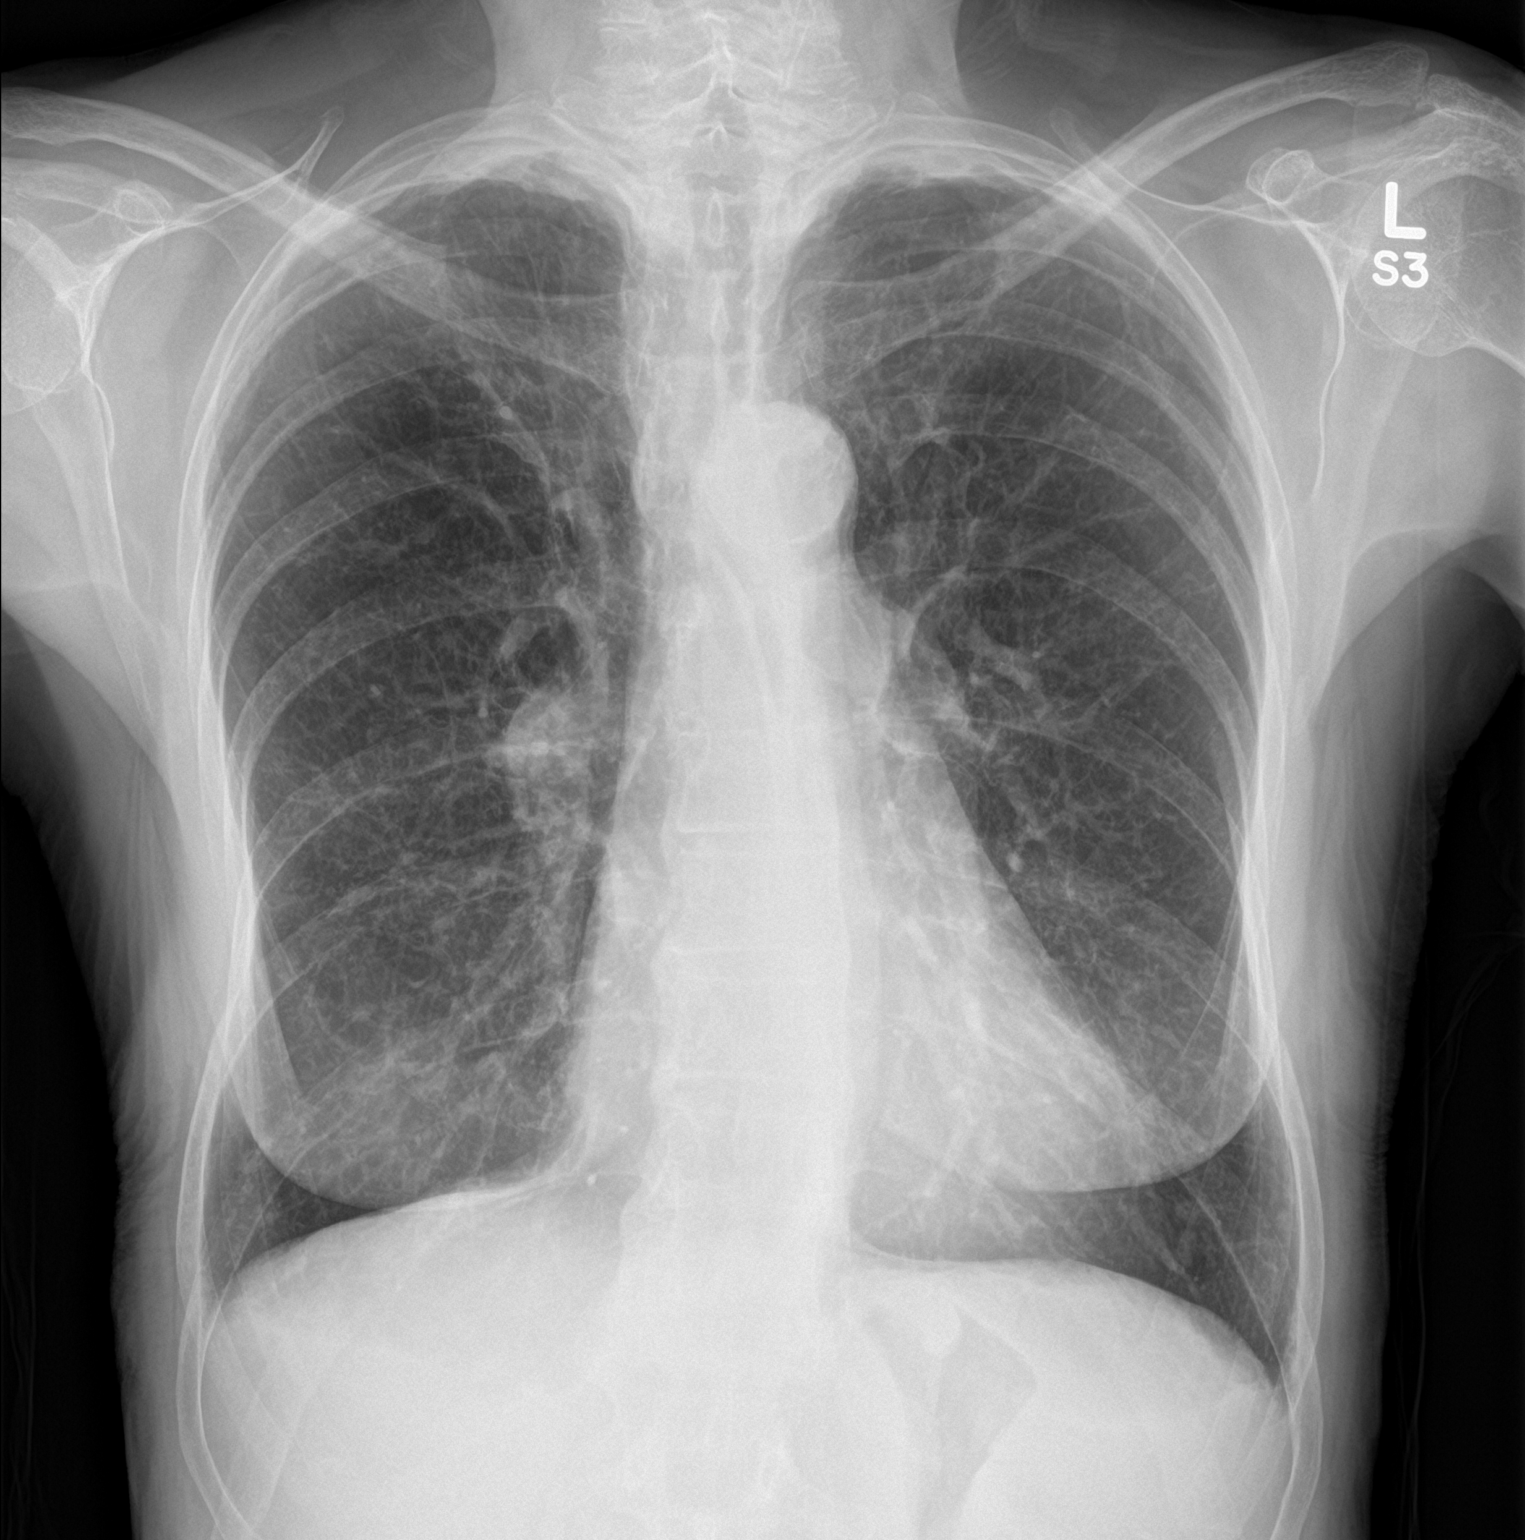

[chest lat]
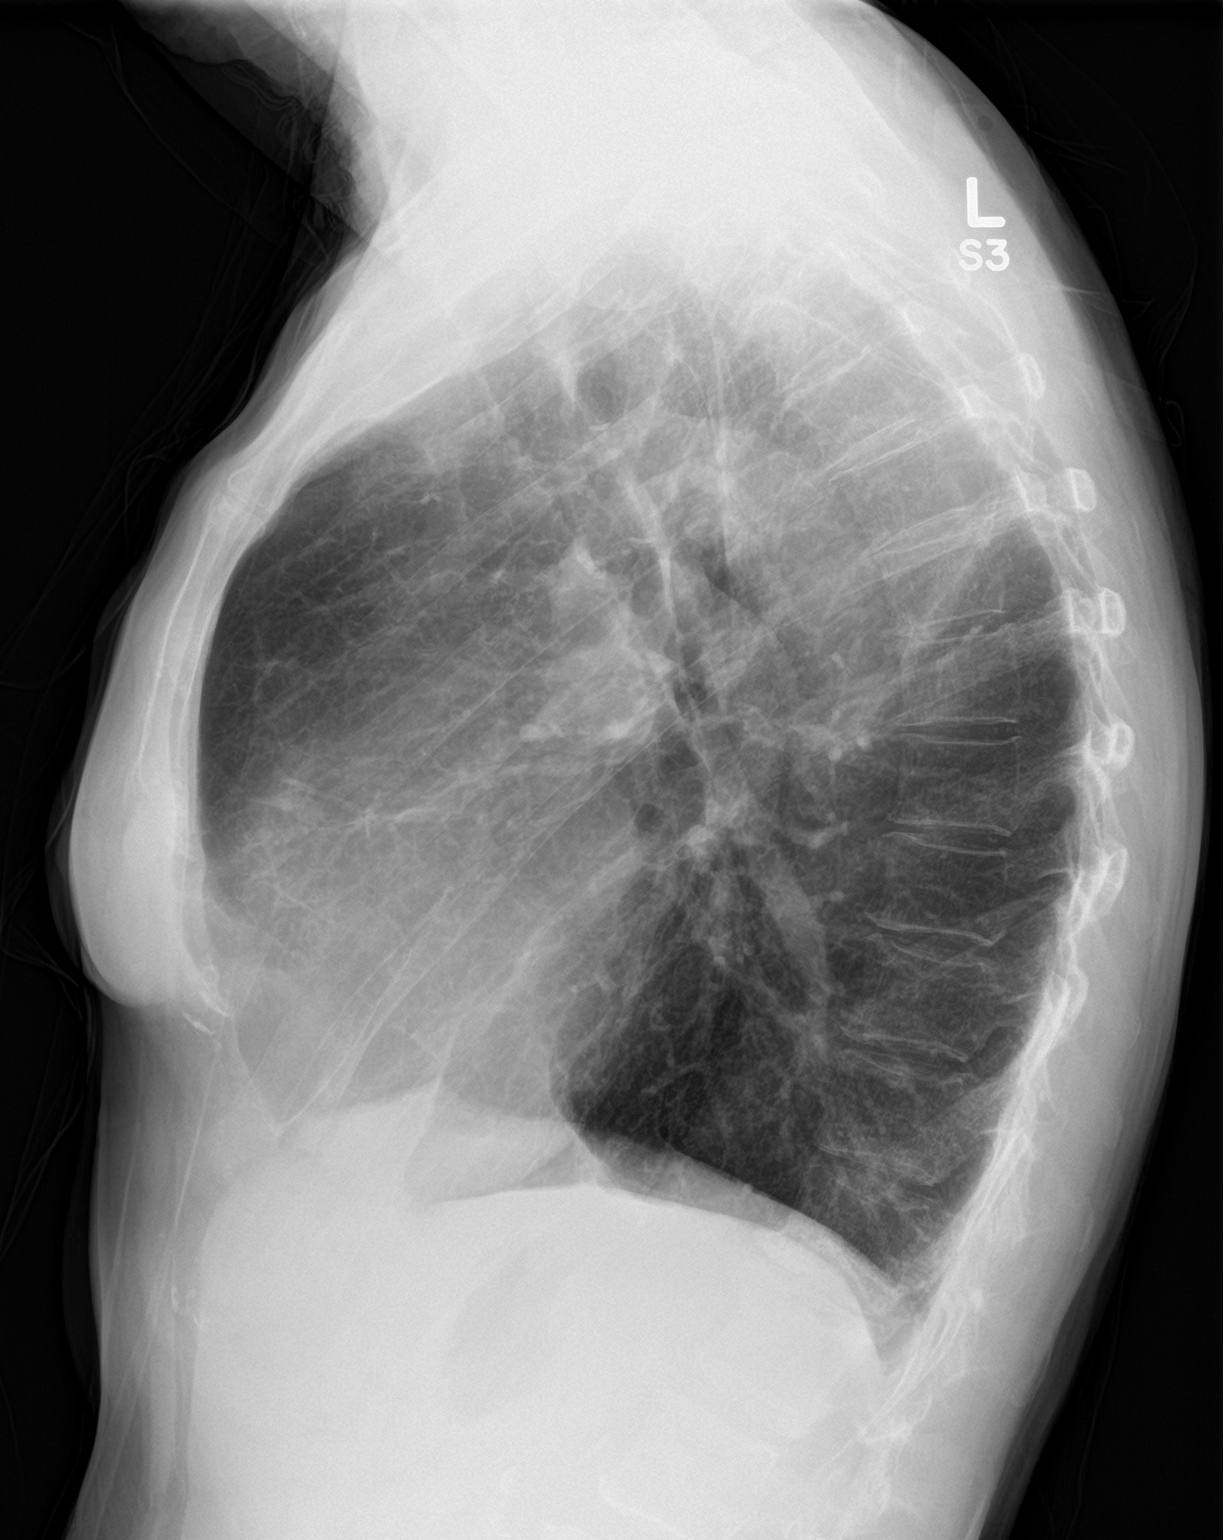

[2 of 2 positions shown; findings below may reference images not displayed]

FINDINGS: Borderline enlargement of cardiac silhouette.

Atherosclerotic calcification aorta.

Mediastinal contours and pulmonary vascularity normal.

Emphysematous and minimal bronchitic changes consistent with COPD.

Biapical scarring and chronic accentuation of RIGHT middle lobe
markings, stable.

No acute infiltrate, pleural effusion or pneumothorax.

No definite focal pulmonary nodule identified.

Bones appear demineralized.
IMPRESSION: COPD changes without acute infiltrate.

## 2018-02-06 ENCOUNTER — Other Ambulatory Visit: Payer: Self-pay | Admitting: Family Medicine

## 2018-02-06 DIAGNOSIS — K219 Gastro-esophageal reflux disease without esophagitis: Secondary | ICD-10-CM

## 2018-02-06 DIAGNOSIS — E785 Hyperlipidemia, unspecified: Secondary | ICD-10-CM

## 2018-05-10 IMAGING — MR MR HEAD W/O CM
9 of 10 series · 35 of 48 positions shown · non-contrast
Comparison: CT HEAD November 13, 2015 and MRI head May 06, 2013

CLINICAL DATA: Frontal headache for 1 week, vomiting. Hypertensive.

EXAM:
MRI HEAD WITHOUT CONTRAST
TECHNIQUE: Multiplanar, multiecho pulse sequences of the brain and surrounding
structures were obtained without intravenous contrast.

[Series 3: DWI · axial · 3.0mm · 0.94mm/px · z∈[-117,+28]mm · 9 of 100 slices shown (1 of 2)]
[im 1/100]
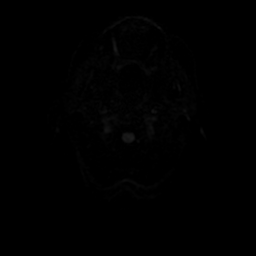
[im 13/100]
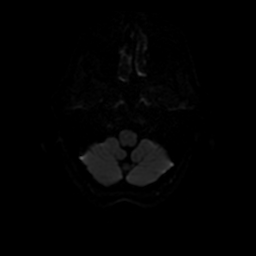
[im 25/100]
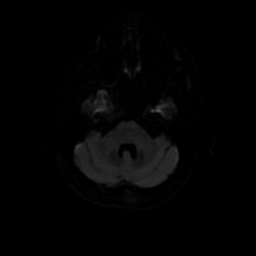
[im 38/100]
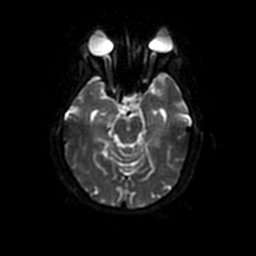
[im 50/100]
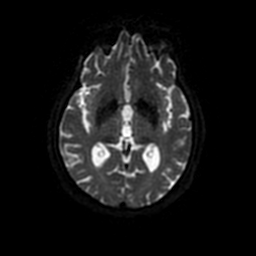
[im 62/100]
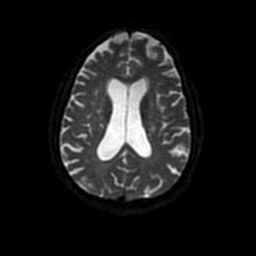
[im 75/100]
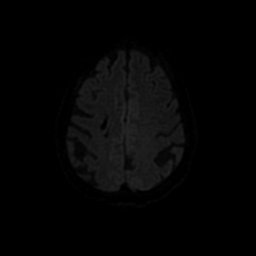
[im 87/100]
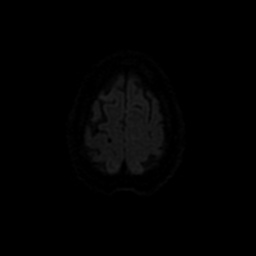
[im 100/100]
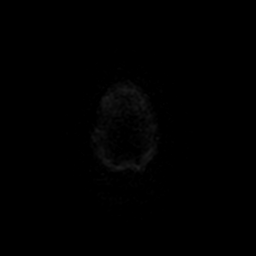

[Series 4: FLAIR · sagittal · 5.0mm · 0.47mm/px · 2 of 21 slices shown (1 of 2)]
[im 1/21]
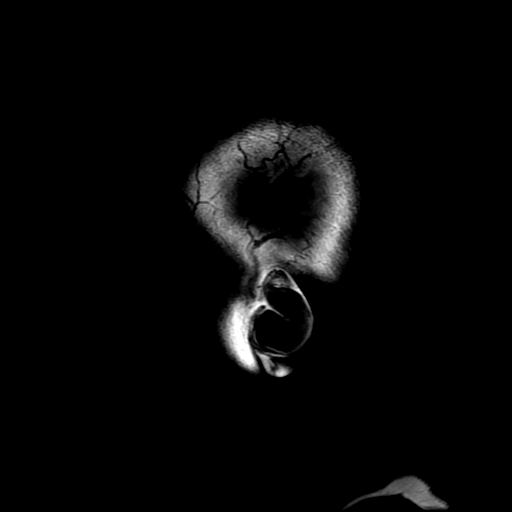
[im 21/21]
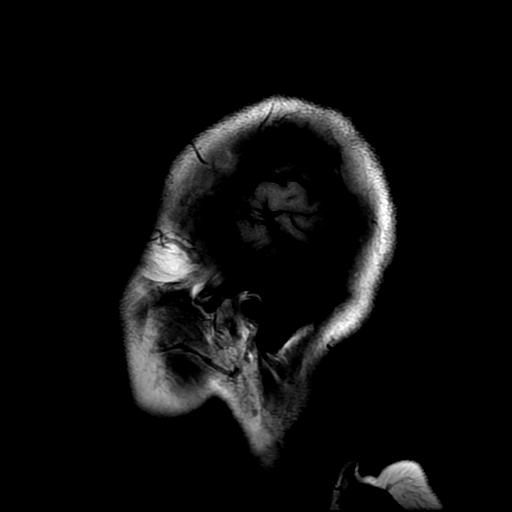

[Series 5: T2 · axial · 5.0mm · 0.47mm/px · z∈[-112,+31]mm · 2 of 25 slices shown (1 of 2)]
[im 1/25]
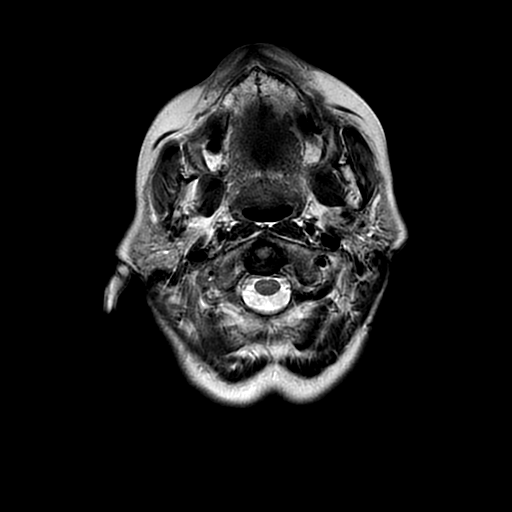
[im 25/25]
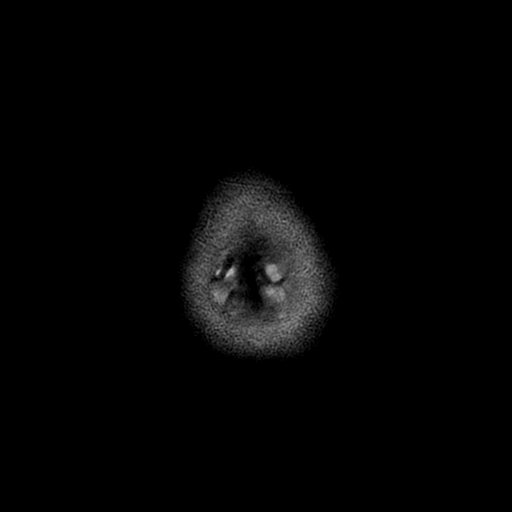

[Series 6: FLAIR · axial · 5.0mm · 0.47mm/px · z∈[-112,+31]mm · 2 of 25 slices shown (2 of 2)]
[im 1/25]
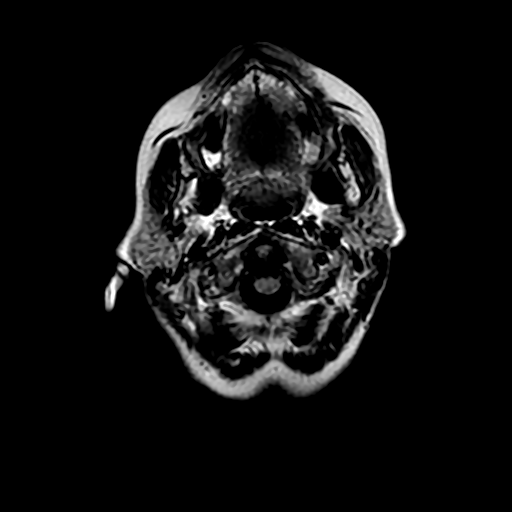
[im 25/25]
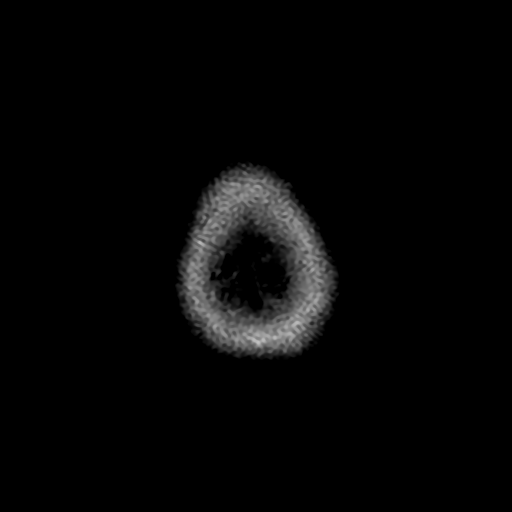

[Series 7: (person_name) · axial · 3.0mm · 0.47mm/px · z∈[-114,-97]mm · 2 of 100 slices shown]
[im 1/100]
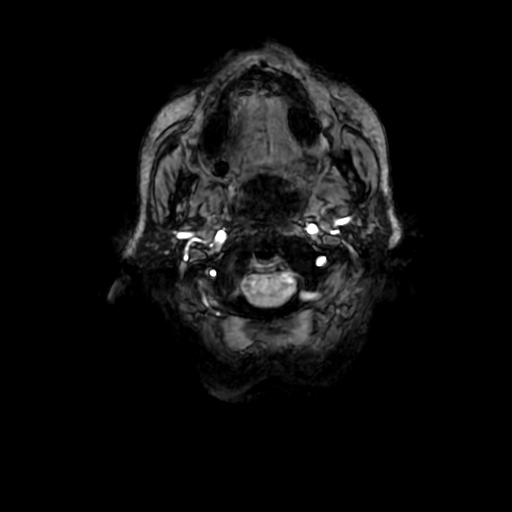
[im 12/100]
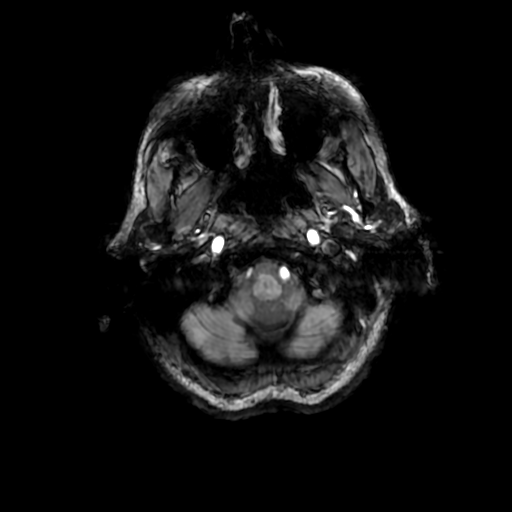

[Series 9: DWI · coronal · 4.0mm · 0.94mm/px · 7 of 72 slices shown (2 of 2)]
[im 1/72]
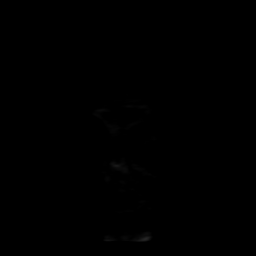
[im 12/72]
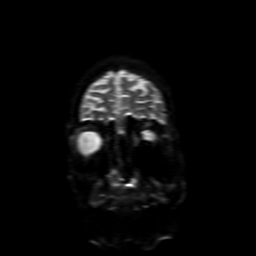
[im 24/72]
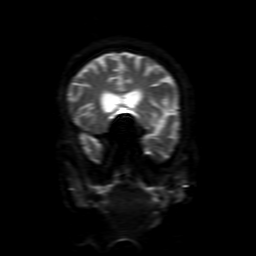
[im 36/72]
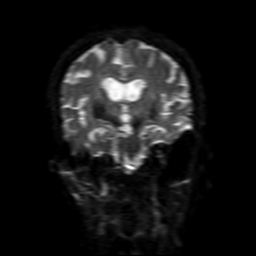
[im 48/72]
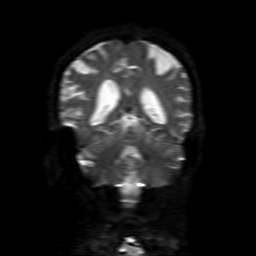
[im 60/72]
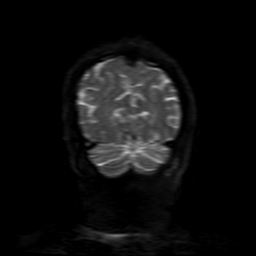
[im 72/72]
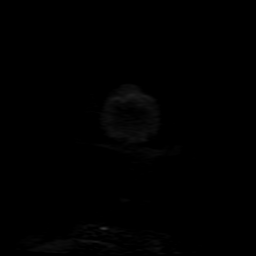

[Series 10: T2 · coronal · 5.0mm · 0.47mm/px · 3 of 27 slices shown (2 of 2)]
[im 1/27]
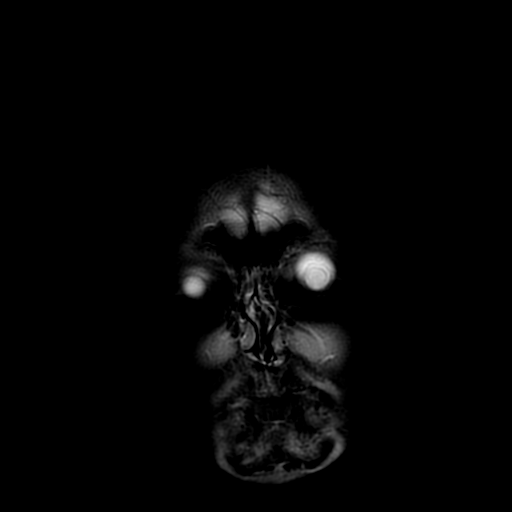
[im 14/27]
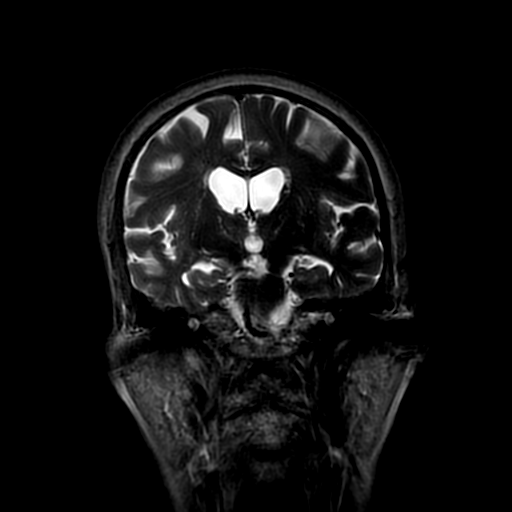
[im 27/27]
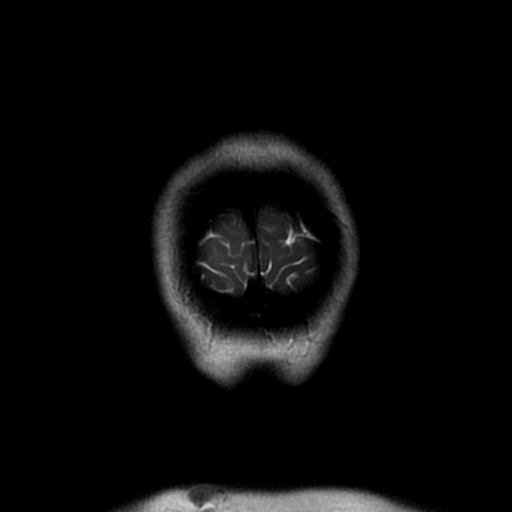

[Series 350: ADC · axial · 3.0mm · 0.94mm/px · z∈[-117,+28]mm · 5 of 50 slices shown (1 of 2)]
[im 1/50]
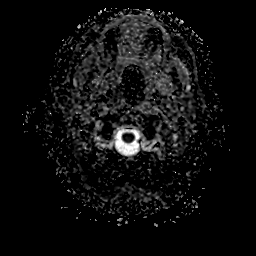
[im 13/50]
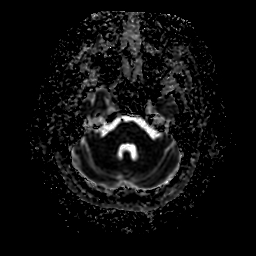
[im 25/50]
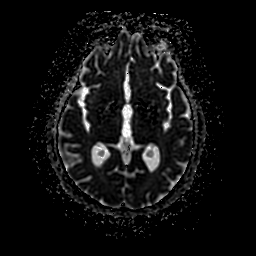
[im 37/50]
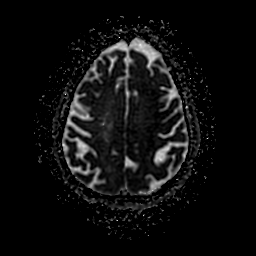
[im 50/50]
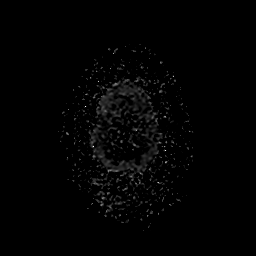

[Series 950: ADC · coronal · 4.0mm · 0.94mm/px · 3 of 36 slices shown (2 of 2)]
[im 1/36]
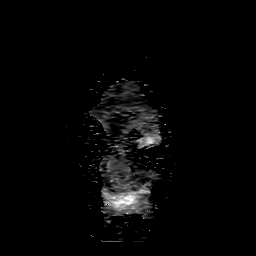
[im 18/36]
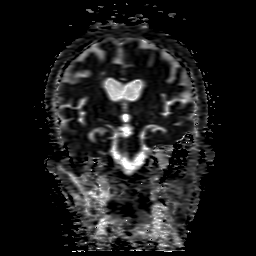
[im 36/36]
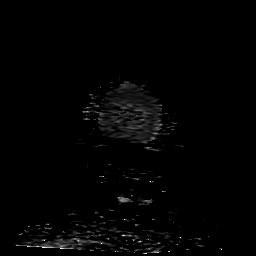

[35 of 48 positions shown; findings below may reference images not displayed]

FINDINGS: INTRACRANIAL CONTENTS: No reduced diffusion to suggest acute
ischemia. No susceptibility artifact to suggest hemorrhage. The
ventricles and sulci are normal for patient's age. No suspicious
parenchymal signal, masses or mass effect. No abnormal extra-axial
fluid collections. Mild stable supratentorial and pontine white
matter changes compatible with chronic small vessel ischemic
disease. Two adjacent RIGHT frontal developmental venous anomalies,
unchanged. No extra-axial masses though, contrast enhanced sequences
would be more sensitive. Normal major intracranial vascular flow
voids present at skull base.

ORBITS: The included ocular globes and orbital contents are
non-suspicious. Status post bilateral ocular lens implants.

SINUSES: The mastoid air-cells and included paranasal sinuses are
well-aerated.

SKULL/SOFT TISSUES: No abnormal sellar expansion. No suspicious
calvarial bone marrow signal. Craniocervical junction maintained.
Patient is edentulous.
IMPRESSION: No acute intracranial process.

Stable appearance of the head from prior MRI including 2 adjacent
RIGHT frontal developmental venous anomalies.
